# Patient Record
Sex: Female | Born: 1945
Health system: Southern US, Community
[De-identification: ages and names within clinical notes are randomized; demographics above are authoritative.]

## PROBLEM LIST (undated history)

## (undated) DIAGNOSIS — D649 Anemia, unspecified: Secondary | ICD-10-CM

## (undated) DIAGNOSIS — N189 Chronic kidney disease, unspecified: Secondary | ICD-10-CM

## (undated) DIAGNOSIS — E7800 Pure hypercholesterolemia, unspecified: Secondary | ICD-10-CM

## (undated) DIAGNOSIS — I10 Essential (primary) hypertension: Secondary | ICD-10-CM

## (undated) DIAGNOSIS — M1990 Unspecified osteoarthritis, unspecified site: Secondary | ICD-10-CM

## (undated) HISTORY — DX: Chronic kidney disease, unspecified: N18.9

## (undated) HISTORY — PX: ABDOMINAL HYSTERECTOMY: SHX81

## (undated) HISTORY — DX: Anemia, unspecified: D64.9

---

## 2006-10-02 ENCOUNTER — Ambulatory Visit: Payer: Self-pay | Admitting: Family Medicine

## 2011-08-22 ENCOUNTER — Ambulatory Visit: Payer: Self-pay | Admitting: Family Medicine

## 2014-10-06 DIAGNOSIS — D631 Anemia in chronic kidney disease: Secondary | ICD-10-CM | POA: Diagnosis not present

## 2014-10-06 DIAGNOSIS — N2581 Secondary hyperparathyroidism of renal origin: Secondary | ICD-10-CM | POA: Diagnosis not present

## 2014-10-06 DIAGNOSIS — I120 Hypertensive chronic kidney disease with stage 5 chronic kidney disease or end stage renal disease: Secondary | ICD-10-CM | POA: Diagnosis not present

## 2014-10-06 DIAGNOSIS — N185 Chronic kidney disease, stage 5: Secondary | ICD-10-CM | POA: Diagnosis not present

## 2014-10-17 DIAGNOSIS — N185 Chronic kidney disease, stage 5: Secondary | ICD-10-CM | POA: Diagnosis not present

## 2014-10-17 DIAGNOSIS — E780 Pure hypercholesterolemia: Secondary | ICD-10-CM | POA: Diagnosis not present

## 2014-10-17 DIAGNOSIS — Z23 Encounter for immunization: Secondary | ICD-10-CM | POA: Diagnosis not present

## 2014-10-17 DIAGNOSIS — I10 Essential (primary) hypertension: Secondary | ICD-10-CM | POA: Diagnosis not present

## 2014-10-17 DIAGNOSIS — Z1231 Encounter for screening mammogram for malignant neoplasm of breast: Secondary | ICD-10-CM | POA: Diagnosis not present

## 2014-10-19 DIAGNOSIS — N185 Chronic kidney disease, stage 5: Secondary | ICD-10-CM | POA: Diagnosis not present

## 2014-10-19 DIAGNOSIS — D631 Anemia in chronic kidney disease: Secondary | ICD-10-CM | POA: Diagnosis not present

## 2014-10-19 DIAGNOSIS — E872 Acidosis: Secondary | ICD-10-CM | POA: Diagnosis not present

## 2014-10-19 DIAGNOSIS — N2581 Secondary hyperparathyroidism of renal origin: Secondary | ICD-10-CM | POA: Diagnosis not present

## 2014-11-01 ENCOUNTER — Ambulatory Visit: Payer: Self-pay | Admitting: Family Medicine

## 2014-11-01 DIAGNOSIS — Z1231 Encounter for screening mammogram for malignant neoplasm of breast: Secondary | ICD-10-CM | POA: Diagnosis not present

## 2014-11-11 DIAGNOSIS — I120 Hypertensive chronic kidney disease with stage 5 chronic kidney disease or end stage renal disease: Secondary | ICD-10-CM | POA: Diagnosis not present

## 2014-11-11 DIAGNOSIS — N2581 Secondary hyperparathyroidism of renal origin: Secondary | ICD-10-CM | POA: Diagnosis not present

## 2014-11-11 DIAGNOSIS — D631 Anemia in chronic kidney disease: Secondary | ICD-10-CM | POA: Diagnosis not present

## 2014-11-11 DIAGNOSIS — N185 Chronic kidney disease, stage 5: Secondary | ICD-10-CM | POA: Diagnosis not present

## 2014-11-16 DIAGNOSIS — N2581 Secondary hyperparathyroidism of renal origin: Secondary | ICD-10-CM | POA: Diagnosis not present

## 2014-11-16 DIAGNOSIS — D631 Anemia in chronic kidney disease: Secondary | ICD-10-CM | POA: Diagnosis not present

## 2014-11-16 DIAGNOSIS — E872 Acidosis: Secondary | ICD-10-CM | POA: Diagnosis not present

## 2014-11-16 DIAGNOSIS — N185 Chronic kidney disease, stage 5: Secondary | ICD-10-CM | POA: Diagnosis not present

## 2014-12-16 DIAGNOSIS — E872 Acidosis: Secondary | ICD-10-CM | POA: Diagnosis not present

## 2014-12-16 DIAGNOSIS — I120 Hypertensive chronic kidney disease with stage 5 chronic kidney disease or end stage renal disease: Secondary | ICD-10-CM | POA: Diagnosis not present

## 2014-12-16 DIAGNOSIS — N185 Chronic kidney disease, stage 5: Secondary | ICD-10-CM | POA: Diagnosis not present

## 2014-12-16 DIAGNOSIS — D631 Anemia in chronic kidney disease: Secondary | ICD-10-CM | POA: Diagnosis not present

## 2014-12-16 DIAGNOSIS — N2581 Secondary hyperparathyroidism of renal origin: Secondary | ICD-10-CM | POA: Diagnosis not present

## 2014-12-21 DIAGNOSIS — I10 Essential (primary) hypertension: Secondary | ICD-10-CM | POA: Diagnosis not present

## 2014-12-21 DIAGNOSIS — N185 Chronic kidney disease, stage 5: Secondary | ICD-10-CM | POA: Diagnosis not present

## 2014-12-21 DIAGNOSIS — N2581 Secondary hyperparathyroidism of renal origin: Secondary | ICD-10-CM | POA: Diagnosis not present

## 2014-12-21 DIAGNOSIS — D631 Anemia in chronic kidney disease: Secondary | ICD-10-CM | POA: Diagnosis not present

## 2015-01-13 DIAGNOSIS — N185 Chronic kidney disease, stage 5: Secondary | ICD-10-CM | POA: Diagnosis not present

## 2015-01-13 DIAGNOSIS — I129 Hypertensive chronic kidney disease with stage 1 through stage 4 chronic kidney disease, or unspecified chronic kidney disease: Secondary | ICD-10-CM | POA: Diagnosis not present

## 2015-01-13 DIAGNOSIS — I120 Hypertensive chronic kidney disease with stage 5 chronic kidney disease or end stage renal disease: Secondary | ICD-10-CM | POA: Diagnosis not present

## 2015-01-18 DIAGNOSIS — I10 Essential (primary) hypertension: Secondary | ICD-10-CM | POA: Diagnosis not present

## 2015-01-18 DIAGNOSIS — N2581 Secondary hyperparathyroidism of renal origin: Secondary | ICD-10-CM | POA: Diagnosis not present

## 2015-01-18 DIAGNOSIS — E872 Acidosis: Secondary | ICD-10-CM | POA: Diagnosis not present

## 2015-01-18 DIAGNOSIS — N185 Chronic kidney disease, stage 5: Secondary | ICD-10-CM | POA: Diagnosis not present

## 2015-02-10 DIAGNOSIS — N185 Chronic kidney disease, stage 5: Secondary | ICD-10-CM | POA: Diagnosis not present

## 2015-02-10 DIAGNOSIS — I129 Hypertensive chronic kidney disease with stage 1 through stage 4 chronic kidney disease, or unspecified chronic kidney disease: Secondary | ICD-10-CM | POA: Diagnosis not present

## 2015-02-10 DIAGNOSIS — I120 Hypertensive chronic kidney disease with stage 5 chronic kidney disease or end stage renal disease: Secondary | ICD-10-CM | POA: Diagnosis not present

## 2015-02-10 DIAGNOSIS — N2581 Secondary hyperparathyroidism of renal origin: Secondary | ICD-10-CM | POA: Diagnosis not present

## 2015-02-14 DIAGNOSIS — I10 Essential (primary) hypertension: Secondary | ICD-10-CM | POA: Diagnosis not present

## 2015-02-14 DIAGNOSIS — N185 Chronic kidney disease, stage 5: Secondary | ICD-10-CM | POA: Diagnosis not present

## 2015-02-14 DIAGNOSIS — N2581 Secondary hyperparathyroidism of renal origin: Secondary | ICD-10-CM | POA: Diagnosis not present

## 2015-02-14 DIAGNOSIS — D631 Anemia in chronic kidney disease: Secondary | ICD-10-CM | POA: Diagnosis not present

## 2015-02-24 DIAGNOSIS — M25561 Pain in right knee: Secondary | ICD-10-CM | POA: Diagnosis not present

## 2015-02-24 DIAGNOSIS — M25562 Pain in left knee: Secondary | ICD-10-CM | POA: Diagnosis not present

## 2015-02-24 DIAGNOSIS — G8929 Other chronic pain: Secondary | ICD-10-CM | POA: Diagnosis not present

## 2015-03-17 DIAGNOSIS — L120 Bullous pemphigoid: Secondary | ICD-10-CM | POA: Diagnosis not present

## 2015-03-17 DIAGNOSIS — D631 Anemia in chronic kidney disease: Secondary | ICD-10-CM | POA: Diagnosis not present

## 2015-03-17 DIAGNOSIS — E872 Acidosis: Secondary | ICD-10-CM | POA: Diagnosis not present

## 2015-03-17 DIAGNOSIS — N2581 Secondary hyperparathyroidism of renal origin: Secondary | ICD-10-CM | POA: Diagnosis not present

## 2015-03-22 DIAGNOSIS — I10 Essential (primary) hypertension: Secondary | ICD-10-CM | POA: Diagnosis not present

## 2015-03-22 DIAGNOSIS — N185 Chronic kidney disease, stage 5: Secondary | ICD-10-CM | POA: Diagnosis not present

## 2015-03-22 DIAGNOSIS — D631 Anemia in chronic kidney disease: Secondary | ICD-10-CM | POA: Diagnosis not present

## 2015-03-22 DIAGNOSIS — N2581 Secondary hyperparathyroidism of renal origin: Secondary | ICD-10-CM | POA: Diagnosis not present

## 2015-04-18 DIAGNOSIS — Z1211 Encounter for screening for malignant neoplasm of colon: Secondary | ICD-10-CM | POA: Diagnosis not present

## 2015-04-18 DIAGNOSIS — I10 Essential (primary) hypertension: Secondary | ICD-10-CM | POA: Diagnosis not present

## 2015-04-18 DIAGNOSIS — E780 Pure hypercholesterolemia: Secondary | ICD-10-CM | POA: Diagnosis not present

## 2015-04-18 DIAGNOSIS — N185 Chronic kidney disease, stage 5: Secondary | ICD-10-CM | POA: Diagnosis not present

## 2015-04-28 DIAGNOSIS — N2581 Secondary hyperparathyroidism of renal origin: Secondary | ICD-10-CM | POA: Diagnosis not present

## 2015-04-28 DIAGNOSIS — N185 Chronic kidney disease, stage 5: Secondary | ICD-10-CM | POA: Diagnosis not present

## 2015-04-28 DIAGNOSIS — D631 Anemia in chronic kidney disease: Secondary | ICD-10-CM | POA: Diagnosis not present

## 2015-04-28 DIAGNOSIS — E872 Acidosis: Secondary | ICD-10-CM | POA: Diagnosis not present

## 2015-04-28 DIAGNOSIS — I10 Essential (primary) hypertension: Secondary | ICD-10-CM | POA: Diagnosis not present

## 2015-06-02 DIAGNOSIS — I129 Hypertensive chronic kidney disease with stage 1 through stage 4 chronic kidney disease, or unspecified chronic kidney disease: Secondary | ICD-10-CM | POA: Diagnosis not present

## 2015-06-02 DIAGNOSIS — N2581 Secondary hyperparathyroidism of renal origin: Secondary | ICD-10-CM | POA: Diagnosis not present

## 2015-06-02 DIAGNOSIS — E872 Acidosis: Secondary | ICD-10-CM | POA: Diagnosis not present

## 2015-06-02 DIAGNOSIS — N185 Chronic kidney disease, stage 5: Secondary | ICD-10-CM | POA: Diagnosis not present

## 2015-06-02 DIAGNOSIS — D631 Anemia in chronic kidney disease: Secondary | ICD-10-CM | POA: Diagnosis not present

## 2015-06-06 DIAGNOSIS — D631 Anemia in chronic kidney disease: Secondary | ICD-10-CM | POA: Diagnosis not present

## 2015-06-06 DIAGNOSIS — N185 Chronic kidney disease, stage 5: Secondary | ICD-10-CM | POA: Diagnosis not present

## 2015-06-06 DIAGNOSIS — E872 Acidosis: Secondary | ICD-10-CM | POA: Diagnosis not present

## 2015-06-06 DIAGNOSIS — I10 Essential (primary) hypertension: Secondary | ICD-10-CM | POA: Diagnosis not present

## 2015-06-22 ENCOUNTER — Inpatient Hospital Stay: Payer: Self-pay | Admitting: Hematology and Oncology

## 2015-07-06 ENCOUNTER — Inpatient Hospital Stay: Payer: Commercial Managed Care - HMO

## 2015-07-06 ENCOUNTER — Inpatient Hospital Stay: Payer: Commercial Managed Care - HMO | Attending: Hematology and Oncology | Admitting: Hematology and Oncology

## 2015-07-06 ENCOUNTER — Encounter: Payer: Self-pay | Admitting: Hematology and Oncology

## 2015-07-06 VITALS — BP 121/77 | HR 83 | Temp 98.0°F | Ht 63.0 in | Wt 141.1 lb

## 2015-07-06 DIAGNOSIS — D631 Anemia in chronic kidney disease: Secondary | ICD-10-CM

## 2015-07-06 DIAGNOSIS — D649 Anemia, unspecified: Secondary | ICD-10-CM

## 2015-07-06 DIAGNOSIS — N185 Chronic kidney disease, stage 5: Secondary | ICD-10-CM

## 2015-07-06 DIAGNOSIS — I129 Hypertensive chronic kidney disease with stage 1 through stage 4 chronic kidney disease, or unspecified chronic kidney disease: Secondary | ICD-10-CM | POA: Diagnosis not present

## 2015-07-06 DIAGNOSIS — Z79899 Other long term (current) drug therapy: Secondary | ICD-10-CM | POA: Insufficient documentation

## 2015-07-06 DIAGNOSIS — D573 Sickle-cell trait: Secondary | ICD-10-CM | POA: Diagnosis not present

## 2015-07-06 LAB — IRON AND TIBC
Iron: 52 ug/dL (ref 28–170)
Saturation Ratios: 18 % (ref 10.4–31.8)
TIBC: 297 ug/dL (ref 250–450)
UIBC: 245 ug/dL

## 2015-07-06 LAB — VITAMIN B12: Vitamin B-12: 324 pg/mL (ref 180–914)

## 2015-07-06 LAB — CBC WITH DIFFERENTIAL/PLATELET
Basophils Absolute: 0 10*3/uL (ref 0–0.1)
Basophils Relative: 1 %
Eosinophils Absolute: 0.2 10*3/uL (ref 0–0.7)
Eosinophils Relative: 2 %
HCT: 24 % — ABNORMAL LOW (ref 35.0–47.0)
Hemoglobin: 7.9 g/dL — ABNORMAL LOW (ref 12.0–16.0)
Lymphocytes Relative: 26 %
Lymphs Abs: 2.5 10*3/uL (ref 1.0–3.6)
MCH: 25 pg — ABNORMAL LOW (ref 26.0–34.0)
MCHC: 32.7 g/dL (ref 32.0–36.0)
MCV: 76.4 fL — ABNORMAL LOW (ref 80.0–100.0)
Monocytes Absolute: 0.8 10*3/uL (ref 0.2–0.9)
Monocytes Relative: 8 %
Neutro Abs: 6 10*3/uL (ref 1.4–6.5)
Neutrophils Relative %: 63 %
Platelets: 375 10*3/uL (ref 150–440)
RBC: 3.14 MIL/uL — ABNORMAL LOW (ref 3.80–5.20)
RDW: 14.2 % (ref 11.5–14.5)
WBC: 9.5 10*3/uL (ref 3.6–11.0)

## 2015-07-06 LAB — FERRITIN: Ferritin: 109 ng/mL (ref 11–307)

## 2015-07-06 LAB — FOLATE: Folate: 14 ng/mL (ref >5.9)

## 2015-07-06 LAB — TSH: TSH: 0.735 u[IU]/mL (ref 0.350–4.500)

## 2015-07-06 NOTE — Progress Notes (Signed)
[facility] day:  07/06/2015  Chief Complaint: Kathleen Sharp is a 69 y.o. female with stage V chronic kidney disease who is referred by Dr Candiss Norse for management of anemia of chronic renal disease.  HPI: The patient states that she has been followed by Dr. Candiss Norse for a stage V chronic kidney disease. Her renal function has been going down over time and is felt secondary to hypertension.  She has a family history of renal disease (father and brother). She is not on dialysis.    She states that she has been on oral iron x 1 year.  It has helped a little.  She watches her diet closely.  She loves meat, but is limited secondary to her renal disease.  She craves meat and chocolate.  She denies any melena or hematochezia. She notes that her stool is dark on oral iron. She denies any nose bleeds, gum bleeds, hematuria or vaginal bleeding.  She states that she missed her colonoscopy, but has one planned. She has never had an EGD. Hemoglobin electrophoresis revealed sickle cell trait with a hemoglobin S of 32%.  Patient has had serial labs.  CBC on 01/13/2015 included a hematocrit 23.3, hemoglobin 7.8, MCV 76, platelets 351,000, white count 7300 with an ANC of 4300. Creatinine was 4.82.  CBC on 02/10/2015 included a hematocrit 22.9, hemoglobin 8, and MCV 74.  Creatinine was 4.72.  CBC on 03/17/2015 included a hematocrit 23.6, hemoglobin 7.8, and MCV 76. Creatinine was 4.61. CBC on 04/28/2015 included a hematocrit of 22.6, hemoglobin 7.5, and MCV 74.  Creatinine was was 5.29.  CBC on 06/02/2015 revealed a hematocrit 22.7, hemoglobin 7.3, MCV 75 platelets 386,000, white count 8100 with an ANC of 4800. Creatinine was 4.74.  Symptomatically, she feels a little sluggish.  Past Medical History  Diagnosis Date  . Anemia   . Chronic kidney disease     No past surgical history on file.  Family History  Problem Relation Age of Onset  . Kidney disease Father   .  Kidney disease Brother   . Heart disease Mother     Social History:  has no tobacco, alcohol, and drug history on file.  The patient is alone today.  Allergies: No Known Allergies  Current Medications: Current Outpatient Prescriptions  Medication Sig Dispense Refill  . atorvastatin (LIPITOR) 20 MG tablet Take by mouth.    . calcitRIOL (ROCALTROL) 0.25 MCG capsule Take by mouth.    . IRON PO Take by mouth.    Marland Kitchen lisinopril-hydrochlorothiazide (PRINZIDE,ZESTORETIC) 20-25 MG tablet Take by mouth.    Marland Kitchen NIFEdipine (PROCARDIA-XL/ADALAT CC) 60 MG 24 hr tablet Take by mouth.    . sodium chloride 1 G tablet Take 1 g by mouth once.     No current facility-administered medications for this visit.    Review of Systems:  GENERAL:  Feels sluggish, but alright.  No fevers, sweats or weight loss. PERFORMANCE STATUS (ECOG):  1 HEENT:  No visual changes, runny nose, sore throat, mouth sores or tenderness. Lungs: No shortness of breath or cough.  No hemoptysis. Cardiac:  No chest pain, palpitations, orthopnea, or PND. GI:  No nausea, vomiting, diarrhea, constipation, melena or hematochezia. GU:  No urgency, frequency, dysuria, or hematuria. Musculoskeletal:  No back pain.  No joint pain.  No muscle tenderness. Extremities:  No pain or swelling. Skin:  No rashes or skin changes. Neuro:  No headache, numbness or weakness, balance or coordination issues. Endocrine:  No diabetes, thyroid issues, hot flashes or night sweats. Psych:  No mood changes, depression or anxiety. Pain:  No focal pain. Review of systems:  All other systems reviewed and found to be negative.  Physical Exam: Blood pressure 121/77, pulse 83, temperature 98 F (36.7 C), temperature source Oral, height 5\' 3"  (1.6 m), weight 141 lb 1.5 oz (64 kg). GENERAL:  Well developed, well nourished, sitting comfortably in the exam room in no acute distress. MENTAL STATUS:  Alert and oriented to person, place and time. HEAD:  Short styled  hair with red rinse.  Normocephalic, atraumatic, face symmetric, no Cushingoid features. EYES:  Glasses.  Brown eyes.  Pupils equal round and reactive to light and accomodation.  No conjunctivitis or scleral icterus. ENT:  Oropharynx clear without lesion.  Upper dentures.  Tongue normal. Mucous membranes moist.  RESPIRATORY:  Clear to auscultation without rales, wheezes or rhonchi. CARDIOVASCULAR:  Regular rate and rhythm without murmur, rub or gallop. ABDOMEN:  Soft, non-tender, with active bowel sounds, and no hepatosplenomegaly.  No masses. SKIN:  No rashes, ulcers or lesions. EXTREMITIES: No edema, no skin discoloration or tenderness.  No palpable cords. LYMPH NODES: No palpable cervical, supraclavicular, axillary or inguinal adenopathy  NEUROLOGICAL: Unremarkable. PSYCH:  Appropriate.  Appointment on 07/06/2015  Component Date Value Ref Range Status  . WBC 07/06/2015 9.5  3.6 - 11.0 K/uL Final  . RBC 07/06/2015 3.14* 3.80 - 5.20 MIL/uL Final  . Hemoglobin 07/06/2015 7.9* 12.0 - 16.0 g/dL Final  . HCT 07/08/2015 24.0* 35.0 - 47.0 % Final  . MCV 07/06/2015 76.4* 80.0 - 100.0 fL Final  . MCH 07/06/2015 25.0* 26.0 - 34.0 pg Final  . MCHC 07/06/2015 32.7  32.0 - 36.0 g/dL Final  . RDW 07/08/2015 14.2  11.5 - 14.5 % Final  . Platelets 07/06/2015 375  150 - 440 K/uL Final  . Neutrophils Relative % 07/06/2015 63   Final  . Neutro Abs 07/06/2015 6.0  1.4 - 6.5 K/uL Final  . Lymphocytes Relative 07/06/2015 26   Final  . Lymphs Abs 07/06/2015 2.5  1.0 - 3.6 K/uL Final  . Monocytes Relative 07/06/2015 8   Final  . Monocytes Absolute 07/06/2015 0.8  0.2 - 0.9 K/uL Final  . Eosinophils Relative 07/06/2015 2   Final  . Eosinophils Absolute 07/06/2015 0.2  0 - 0.7 K/uL Final  . Basophils Relative 07/06/2015 1   Final  . Basophils Absolute 07/06/2015 0.0  0 - 0.1 K/uL Final  . Ferritin 07/06/2015 109  11 - 307 ng/mL Final  . Iron 07/06/2015 52  28 - 170 ug/dL Final  . TIBC 07/08/2015 297  250  - 450 ug/dL Final  . Saturation Ratios 07/06/2015 18  10.4 - 31.8 % Final  . UIBC 07/06/2015 245   Final  . Vitamin B-12 07/06/2015 324  180 - 914 pg/mL Final   Comment: (NOTE) This assay is not validated for testing neonatal or myeloproliferative syndrome specimens for Vitamin B12 levels. Performed at [facility]   . Folate 07/06/2015 14.0  >5.9 ng/mL Final  . TSH 07/06/2015 0.735  0.350 - 4.500 uIU/mL Final  . Erythropoietin 07/06/2015 4.8  2.6 - 18.5 mIU/mL Final   Comment: (NOTE) Performed At: [facility] [address] [place], [place] [id number] MD Mila Homer     Assessment:  Kathleen Sharp is a 69 y.o. female with stage V chronic kidney disease secondary to hypertension.  Creatinine has ranged between 4.72 and 5.29  over the past several months.  She has been on oral iron for over a year with only slight improvement in her hematocrit.  Hemoglobin electrophoresis revealed sickle cell trait with a hemoglobin S of 32%.  She has never had a colonoscopy.  She denies any bleeding.  Diet is good.  Symptomatically, she is fatigued.  Exam is unremarkable.  Plan: 1. Discuss anemia of chronic renal disease.  Discuss management with Procrit.  Discuss baseline labs. 2. Labs today:  CBC with diff, B12, folate, ferritin, iron studies, TSH, epo level. 3. Preauth Procrit 4. RTC for labs (Hgb) +/- Procrit when authorized. 5. RTC in 6 weeks for MD assessment and labs (CBC +/- others) and +/- Procrit.   Lequita Asal, MD  07/06/2015, 2:18 PM

## 2015-07-06 NOTE — Progress Notes (Signed)
Patient is here for initial visit, referral from Dr. Candiss Norse

## 2015-07-07 DIAGNOSIS — N2581 Secondary hyperparathyroidism of renal origin: Secondary | ICD-10-CM | POA: Diagnosis not present

## 2015-07-07 DIAGNOSIS — E872 Acidosis: Secondary | ICD-10-CM | POA: Diagnosis not present

## 2015-07-07 DIAGNOSIS — D631 Anemia in chronic kidney disease: Secondary | ICD-10-CM | POA: Diagnosis not present

## 2015-07-07 DIAGNOSIS — I10 Essential (primary) hypertension: Secondary | ICD-10-CM | POA: Diagnosis not present

## 2015-07-07 DIAGNOSIS — N185 Chronic kidney disease, stage 5: Secondary | ICD-10-CM | POA: Diagnosis not present

## 2015-07-07 LAB — ERYTHROPOIETIN: Erythropoietin: 4.8 m[IU]/mL (ref 2.6–18.5)

## 2015-07-12 ENCOUNTER — Encounter: Payer: Self-pay | Admitting: Hematology and Oncology

## 2015-07-12 DIAGNOSIS — N185 Chronic kidney disease, stage 5: Secondary | ICD-10-CM | POA: Diagnosis not present

## 2015-07-12 DIAGNOSIS — E872 Acidosis: Secondary | ICD-10-CM | POA: Diagnosis not present

## 2015-07-12 DIAGNOSIS — N2581 Secondary hyperparathyroidism of renal origin: Secondary | ICD-10-CM | POA: Diagnosis not present

## 2015-07-12 DIAGNOSIS — I10 Essential (primary) hypertension: Secondary | ICD-10-CM | POA: Diagnosis not present

## 2015-07-18 ENCOUNTER — Other Ambulatory Visit: Payer: Self-pay | Admitting: Hematology and Oncology

## 2015-07-18 DIAGNOSIS — D631 Anemia in chronic kidney disease: Secondary | ICD-10-CM

## 2015-07-18 DIAGNOSIS — N185 Chronic kidney disease, stage 5: Principal | ICD-10-CM

## 2015-07-19 ENCOUNTER — Inpatient Hospital Stay: Payer: Commercial Managed Care - HMO

## 2015-07-19 ENCOUNTER — Other Ambulatory Visit: Payer: Self-pay

## 2015-07-19 DIAGNOSIS — D631 Anemia in chronic kidney disease: Secondary | ICD-10-CM

## 2015-07-19 DIAGNOSIS — N185 Chronic kidney disease, stage 5: Principal | ICD-10-CM

## 2015-07-20 ENCOUNTER — Inpatient Hospital Stay: Payer: Commercial Managed Care - HMO | Attending: Hematology and Oncology

## 2015-07-20 ENCOUNTER — Inpatient Hospital Stay: Payer: Commercial Managed Care - HMO

## 2015-07-20 DIAGNOSIS — D631 Anemia in chronic kidney disease: Secondary | ICD-10-CM | POA: Insufficient documentation

## 2015-07-20 DIAGNOSIS — Z79899 Other long term (current) drug therapy: Secondary | ICD-10-CM | POA: Insufficient documentation

## 2015-07-20 DIAGNOSIS — D573 Sickle-cell trait: Secondary | ICD-10-CM | POA: Insufficient documentation

## 2015-07-20 DIAGNOSIS — I129 Hypertensive chronic kidney disease with stage 1 through stage 4 chronic kidney disease, or unspecified chronic kidney disease: Secondary | ICD-10-CM | POA: Insufficient documentation

## 2015-07-20 DIAGNOSIS — N185 Chronic kidney disease, stage 5: Secondary | ICD-10-CM | POA: Insufficient documentation

## 2015-07-20 NOTE — Progress Notes (Unsigned)
Unable to get blood drawn for labs.  4 attempts in cancer center were made to draw labs, patient sent to phlebotomy in the [facility] facility and there were unsuccessful.  Dr. Florian Buff contacted and due to insurance procrit unable to be given unless recent Hgb value present.  Patient will return to clinic next week for lab draw and procrit injection.

## 2015-07-21 ENCOUNTER — Other Ambulatory Visit: Payer: Commercial Managed Care - HMO

## 2015-07-21 ENCOUNTER — Ambulatory Visit: Payer: Commercial Managed Care - HMO

## 2015-07-26 ENCOUNTER — Inpatient Hospital Stay: Payer: Commercial Managed Care - HMO

## 2015-07-26 VITALS — BP 146/82 | HR 76 | Temp 97.0°F | Resp 18

## 2015-07-26 DIAGNOSIS — D631 Anemia in chronic kidney disease: Secondary | ICD-10-CM

## 2015-07-26 DIAGNOSIS — N185 Chronic kidney disease, stage 5: Principal | ICD-10-CM

## 2015-07-26 DIAGNOSIS — Z79899 Other long term (current) drug therapy: Secondary | ICD-10-CM | POA: Diagnosis not present

## 2015-07-26 DIAGNOSIS — I129 Hypertensive chronic kidney disease with stage 1 through stage 4 chronic kidney disease, or unspecified chronic kidney disease: Secondary | ICD-10-CM | POA: Diagnosis not present

## 2015-07-26 DIAGNOSIS — D573 Sickle-cell trait: Secondary | ICD-10-CM | POA: Diagnosis not present

## 2015-07-26 LAB — HEMOGLOBIN: Hemoglobin: 7.4 g/dL — ABNORMAL LOW (ref 12.0–16.0)

## 2015-07-26 MED ORDER — EPOETIN ALFA 10000 UNIT/ML IJ SOLN
10000.0000 [IU] | Freq: Once | INTRAMUSCULAR | Status: AC
  Administered 2015-07-26: 10000 [IU] via SUBCUTANEOUS

## 2015-07-27 ENCOUNTER — Other Ambulatory Visit: Payer: Commercial Managed Care - HMO

## 2015-07-27 ENCOUNTER — Ambulatory Visit: Payer: Commercial Managed Care - HMO

## 2015-08-02 ENCOUNTER — Other Ambulatory Visit: Payer: Self-pay | Admitting: Hematology and Oncology

## 2015-08-02 ENCOUNTER — Inpatient Hospital Stay: Payer: Commercial Managed Care - HMO

## 2015-08-02 VITALS — BP 120/60 | HR 85 | Temp 97.0°F | Resp 18

## 2015-08-02 DIAGNOSIS — Z79899 Other long term (current) drug therapy: Secondary | ICD-10-CM | POA: Diagnosis not present

## 2015-08-02 DIAGNOSIS — I129 Hypertensive chronic kidney disease with stage 1 through stage 4 chronic kidney disease, or unspecified chronic kidney disease: Secondary | ICD-10-CM | POA: Diagnosis not present

## 2015-08-02 DIAGNOSIS — D631 Anemia in chronic kidney disease: Secondary | ICD-10-CM

## 2015-08-02 DIAGNOSIS — N185 Chronic kidney disease, stage 5: Secondary | ICD-10-CM | POA: Diagnosis not present

## 2015-08-02 DIAGNOSIS — D573 Sickle-cell trait: Secondary | ICD-10-CM | POA: Diagnosis not present

## 2015-08-02 LAB — HEMOGLOBIN: Hemoglobin: 7.7 g/dL — ABNORMAL LOW (ref 12.0–16.0)

## 2015-08-02 MED ORDER — EPOETIN ALFA 10000 UNIT/ML IJ SOLN
10000.0000 [IU] | Freq: Once | INTRAMUSCULAR | Status: AC
  Administered 2015-08-02: 10000 [IU] via SUBCUTANEOUS
  Filled 2015-08-02: qty 2

## 2015-08-03 ENCOUNTER — Other Ambulatory Visit: Payer: Commercial Managed Care - HMO

## 2015-08-03 ENCOUNTER — Ambulatory Visit: Payer: Commercial Managed Care - HMO

## 2015-08-09 ENCOUNTER — Inpatient Hospital Stay: Payer: Commercial Managed Care - HMO | Admitting: Hematology and Oncology

## 2015-08-09 ENCOUNTER — Inpatient Hospital Stay: Payer: Commercial Managed Care - HMO

## 2015-08-09 ENCOUNTER — Ambulatory Visit: Payer: Commercial Managed Care - HMO | Admitting: Hematology and Oncology

## 2015-08-09 ENCOUNTER — Ambulatory Visit: Payer: Commercial Managed Care - HMO

## 2015-08-09 ENCOUNTER — Other Ambulatory Visit: Payer: Commercial Managed Care - HMO

## 2015-08-09 VITALS — BP 158/80 | HR 82 | Temp 97.3°F | Resp 20

## 2015-08-09 DIAGNOSIS — D573 Sickle-cell trait: Secondary | ICD-10-CM | POA: Diagnosis not present

## 2015-08-09 DIAGNOSIS — N2581 Secondary hyperparathyroidism of renal origin: Secondary | ICD-10-CM | POA: Diagnosis not present

## 2015-08-09 DIAGNOSIS — D631 Anemia in chronic kidney disease: Secondary | ICD-10-CM | POA: Diagnosis not present

## 2015-08-09 DIAGNOSIS — I10 Essential (primary) hypertension: Secondary | ICD-10-CM | POA: Diagnosis not present

## 2015-08-09 DIAGNOSIS — N185 Chronic kidney disease, stage 5: Principal | ICD-10-CM

## 2015-08-09 DIAGNOSIS — I129 Hypertensive chronic kidney disease with stage 1 through stage 4 chronic kidney disease, or unspecified chronic kidney disease: Secondary | ICD-10-CM | POA: Diagnosis not present

## 2015-08-09 DIAGNOSIS — E872 Acidosis: Secondary | ICD-10-CM | POA: Diagnosis not present

## 2015-08-09 DIAGNOSIS — Z79899 Other long term (current) drug therapy: Secondary | ICD-10-CM | POA: Diagnosis not present

## 2015-08-09 LAB — CBC WITH DIFFERENTIAL/PLATELET
Basophils Absolute: 0.1 10*3/uL (ref 0–0.1)
Basophils Relative: 1 %
Eosinophils Absolute: 0.2 10*3/uL (ref 0–0.7)
Eosinophils Relative: 2 %
HCT: 27.5 % — ABNORMAL LOW (ref 35.0–47.0)
Hemoglobin: 9 g/dL — ABNORMAL LOW (ref 12.0–16.0)
Lymphocytes Relative: 22 %
Lymphs Abs: 2.4 10*3/uL (ref 1.0–3.6)
MCH: 25.2 pg — ABNORMAL LOW (ref 26.0–34.0)
MCHC: 32.7 g/dL (ref 32.0–36.0)
MCV: 77.1 fL — ABNORMAL LOW (ref 80.0–100.0)
Monocytes Absolute: 1 10*3/uL — ABNORMAL HIGH (ref 0.2–0.9)
Monocytes Relative: 9 %
Neutro Abs: 7.3 10*3/uL — ABNORMAL HIGH (ref 1.4–6.5)
Neutrophils Relative %: 66 %
Platelets: 376 10*3/uL (ref 150–440)
RBC: 3.56 MIL/uL — ABNORMAL LOW (ref 3.80–5.20)
RDW: 14.8 % — ABNORMAL HIGH (ref 11.5–14.5)
WBC: 11 10*3/uL (ref 3.6–11.0)

## 2015-08-09 MED ORDER — EPOETIN ALFA 10000 UNIT/ML IJ SOLN
10000.0000 [IU] | Freq: Once | INTRAMUSCULAR | Status: AC
  Administered 2015-08-09: 10000 [IU] via SUBCUTANEOUS

## 2015-08-14 ENCOUNTER — Other Ambulatory Visit: Payer: Commercial Managed Care - HMO

## 2015-08-14 ENCOUNTER — Ambulatory Visit: Payer: Commercial Managed Care - HMO | Admitting: Hematology and Oncology

## 2015-08-14 ENCOUNTER — Ambulatory Visit: Payer: Commercial Managed Care - HMO

## 2015-08-15 DIAGNOSIS — I10 Essential (primary) hypertension: Secondary | ICD-10-CM | POA: Diagnosis not present

## 2015-08-15 DIAGNOSIS — N2581 Secondary hyperparathyroidism of renal origin: Secondary | ICD-10-CM | POA: Diagnosis not present

## 2015-08-15 DIAGNOSIS — N185 Chronic kidney disease, stage 5: Secondary | ICD-10-CM | POA: Diagnosis not present

## 2015-08-15 DIAGNOSIS — D631 Anemia in chronic kidney disease: Secondary | ICD-10-CM | POA: Diagnosis not present

## 2015-08-16 ENCOUNTER — Telehealth: Payer: Self-pay | Admitting: Hematology and Oncology

## 2015-08-16 ENCOUNTER — Other Ambulatory Visit: Payer: Self-pay | Admitting: Hematology and Oncology

## 2015-08-16 ENCOUNTER — Ambulatory Visit: Payer: Commercial Managed Care - HMO

## 2015-08-16 ENCOUNTER — Other Ambulatory Visit: Payer: Commercial Managed Care - HMO

## 2015-08-16 ENCOUNTER — Ambulatory Visit: Payer: Commercial Managed Care - HMO | Admitting: Hematology and Oncology

## 2015-08-16 ENCOUNTER — Inpatient Hospital Stay: Payer: Commercial Managed Care - HMO

## 2015-08-16 DIAGNOSIS — Z79899 Other long term (current) drug therapy: Secondary | ICD-10-CM | POA: Diagnosis not present

## 2015-08-16 DIAGNOSIS — D631 Anemia in chronic kidney disease: Secondary | ICD-10-CM

## 2015-08-16 DIAGNOSIS — D573 Sickle-cell trait: Secondary | ICD-10-CM | POA: Diagnosis not present

## 2015-08-16 DIAGNOSIS — N185 Chronic kidney disease, stage 5: Principal | ICD-10-CM

## 2015-08-16 DIAGNOSIS — I129 Hypertensive chronic kidney disease with stage 1 through stage 4 chronic kidney disease, or unspecified chronic kidney disease: Secondary | ICD-10-CM | POA: Diagnosis not present

## 2015-08-16 LAB — HEMOGLOBIN: Hemoglobin: 9.6 g/dL — ABNORMAL LOW (ref 12.0–16.0)

## 2015-08-16 MED ORDER — EPOETIN ALFA 10000 UNIT/ML IJ SOLN: 10000.0000 [IU] | Freq: Once | INTRAMUSCULAR | Status: AC

## 2015-08-16 NOTE — Telephone Encounter (Signed)
Re:  Procrit  I discussed with the patient today about holding her Procrit as her hematocrit had come up significantly after only 3 dose.   She will be switched to every other week Procrit.  Lequita Asal, MD

## 2015-08-28 ENCOUNTER — Other Ambulatory Visit: Payer: Self-pay

## 2015-08-28 DIAGNOSIS — N185 Chronic kidney disease, stage 5: Principal | ICD-10-CM

## 2015-08-28 DIAGNOSIS — D631 Anemia in chronic kidney disease: Secondary | ICD-10-CM

## 2015-08-30 ENCOUNTER — Encounter: Payer: Self-pay | Admitting: Hematology and Oncology

## 2015-08-30 ENCOUNTER — Inpatient Hospital Stay: Payer: Commercial Managed Care - HMO | Attending: Hematology and Oncology | Admitting: Hematology and Oncology

## 2015-08-30 ENCOUNTER — Inpatient Hospital Stay: Payer: Commercial Managed Care - HMO

## 2015-08-30 ENCOUNTER — Ambulatory Visit: Payer: Commercial Managed Care - HMO

## 2015-08-30 VITALS — BP 142/85 | HR 74 | Temp 98.1°F | Resp 18 | Ht 63.0 in | Wt 183.9 lb

## 2015-08-30 DIAGNOSIS — Z87891 Personal history of nicotine dependence: Secondary | ICD-10-CM | POA: Diagnosis not present

## 2015-08-30 DIAGNOSIS — D631 Anemia in chronic kidney disease: Secondary | ICD-10-CM | POA: Diagnosis not present

## 2015-08-30 DIAGNOSIS — N185 Chronic kidney disease, stage 5: Secondary | ICD-10-CM

## 2015-08-30 DIAGNOSIS — I129 Hypertensive chronic kidney disease with stage 1 through stage 4 chronic kidney disease, or unspecified chronic kidney disease: Secondary | ICD-10-CM | POA: Diagnosis not present

## 2015-08-30 DIAGNOSIS — D573 Sickle-cell trait: Secondary | ICD-10-CM

## 2015-08-30 DIAGNOSIS — Z79899 Other long term (current) drug therapy: Secondary | ICD-10-CM | POA: Diagnosis not present

## 2015-08-30 LAB — CBC WITH DIFFERENTIAL/PLATELET
Basophils Absolute: 0.1 10*3/uL (ref 0–0.1)
Basophils Relative: 1 %
Eosinophils Absolute: 0.1 10*3/uL (ref 0–0.7)
Eosinophils Relative: 2 %
HCT: 28.5 % — ABNORMAL LOW (ref 35.0–47.0)
Hemoglobin: 9.2 g/dL — ABNORMAL LOW (ref 12.0–16.0)
Lymphocytes Relative: 25 %
Lymphs Abs: 2.3 10*3/uL (ref 1.0–3.6)
MCH: 24.9 pg — ABNORMAL LOW (ref 26.0–34.0)
MCHC: 32.4 g/dL (ref 32.0–36.0)
MCV: 76.8 fL — ABNORMAL LOW (ref 80.0–100.0)
Monocytes Absolute: 0.9 10*3/uL (ref 0.2–0.9)
Monocytes Relative: 10 %
Neutro Abs: 5.7 10*3/uL (ref 1.4–6.5)
Neutrophils Relative %: 62 %
Platelets: 327 10*3/uL (ref 150–440)
RBC: 3.71 MIL/uL — ABNORMAL LOW (ref 3.80–5.20)
RDW: 14.5 % (ref 11.5–14.5)
WBC: 9 10*3/uL (ref 3.6–11.0)

## 2015-08-30 MED ORDER — EPOETIN ALFA 10000 UNIT/ML IJ SOLN
10000.0000 [IU] | Freq: Once | INTRAMUSCULAR | Status: AC
  Administered 2015-08-30: 10000 [IU] via SUBCUTANEOUS

## 2015-08-30 NOTE — Progress Notes (Signed)
[facility] day:  08/30/2015  Chief Complaint: Kathleen Sharp is a 69 y.o. female with stage V chronic kidney disease who is seen for 6 week assessment on Procrit.  HPI: The patient was last seen in the medical oncology clinic on 07/06/2015.  At that time, she was seen for initial consultation regarding anemia of chronic renal disease.  Creatinine had ranged between 4.72-5.29 over the past several months.  She had been on oral iron for over a year with only slight improvement in her hematocrit.  Hemoglobin electrophoresis revealed sickle cell trait with a hemoglobin S of 32%.  She had never had a colonoscopy.  She denied any bleeding.  Diet was good.  Labs on 07/06/2015 revealed a hematocrit of 24.0, hemoglobin 7.9, MCV 76.4, platelets 375,000, WBC 9500 with an ANC 6000.  Ferritin was 109 with 18% saturation and TIBC 297 (normal).  B12 was 324 and folate 14.0.  TSH was 0.735.  Erythropoietin level was 4.8.  She was started on Procrit 10,000 units a week.  She received Procrit on 07/26/2015, 08/02/2015, and 08/09/2015. Hemoglobin was 7.4 on 07/26/2015, 7.7 on 08/02/2015, 9.0 on 08/09/2015, and 9.6 on 08/16/2015.  Because her hematocrit went up significantly after only 3 doses, Procrit was held on 08/16/2015.  Symptomatically, she feels like she has "more pep in my step".  She feels good.  She denies any concerns.  She has a follow-up with nephrology on 09/12/2015.  Past Medical History  Diagnosis Date  . Anemia   . Chronic kidney disease     Past Surgical History  Procedure Laterality Date  . Abdominal hysterectomy      Family History  Problem Relation Age of Onset  . Kidney disease Father   . Kidney disease Brother   . Heart disease Mother     Social History:  reports that she quit smoking about 2 years ago. Her smoking use included Cigarettes. She has a 15 pack-year smoking history. She does not have any smokeless tobacco history on  file. She reports that she does not drink alcohol or use illicit drugs.  The patient is alone today.  Allergies: No Known Allergies  Current Medications: Current Outpatient Prescriptions  Medication Sig Dispense Refill  . atorvastatin (LIPITOR) 20 MG tablet Take by mouth.    . calcitRIOL (ROCALTROL) 0.25 MCG capsule Take by mouth.    . IRON PO Take by mouth.    Marland Kitchen lisinopril-hydrochlorothiazide (PRINZIDE,ZESTORETIC) 20-25 MG tablet Take by mouth.    Marland Kitchen NIFEdipine (PROCARDIA-XL/ADALAT CC) 60 MG 24 hr tablet Take by mouth.    . sodium chloride 1 G tablet Take 1 g by mouth once.     No current facility-administered medications for this visit.   Facility-Administered Medications Ordered in Other Visits  Medication Dose Route Frequency Provider Last Rate Last Dose  . epoetin alfa (EPOGEN,PROCRIT) injection 10,000 Units  10,000 Units Subcutaneous Once Lequita Asal, MD   10,000 Units at 08/16/15 1212    Review of Systems:  GENERAL:  Feels good.  "More pep in my step".  No fevers, sweats or weight loss. PERFORMANCE STATUS (ECOG):  1 HEENT:  No visual changes, runny nose, sore throat, mouth sores or tenderness. Lungs: No shortness of breath or cough.  No hemoptysis. Cardiac:  No chest pain, palpitations, orthopnea, or PND. GI:  No nausea, vomiting, diarrhea, constipation, melena or hematochezia. GU:  No urgency, frequency, dysuria, or hematuria. Musculoskeletal:  No back pain.  No  joint pain.  No muscle tenderness. Extremities:  No pain or swelling. Skin:  No rashes or skin changes. Neuro:  No headache, numbness or weakness, balance or coordination issues. Endocrine:  No diabetes, thyroid issues, hot flashes or night sweats. Psych:  No mood changes, depression or anxiety. Pain:  No focal pain. Review of systems:  All other systems reviewed and found to be negative.  Physical Exam: Blood pressure 142/85, pulse 74, temperature 98.1 F (36.7 C), temperature source Tympanic, resp. rate  18, height $RemoveBe'5\' 3"'qlLKptfSf$  (1.6 m), weight 183 lb 13.8 oz (83.4 kg), SpO2 100 %. GENERAL:  Well developed, well nourished, woman sitting comfortably in the exam room in no acute distress. MENTAL STATUS:  Alert and oriented to person, place and time. HEAD:  Shoulder length wavy brown wig.  Normocephalic, atraumatic, face symmetric, no Cushingoid features. EYES:  Glasses.  Brown eyes.  Pupils equal round and reactive to light and accomodation.  No conjunctivitis or scleral icterus. ENT:  Oropharynx clear without lesion.  Upper dentures.  Tongue normal. Mucous membranes moist.  RESPIRATORY:  Clear to auscultation without rales, wheezes or rhonchi. CARDIOVASCULAR:  Regular rate and rhythm without murmur, rub or gallop. ABDOMEN:  Soft, non-tender, with active bowel sounds, and no hepatosplenomegaly.  No masses. SKIN:  No rashes, ulcers or lesions. EXTREMITIES: No edema, no skin discoloration or tenderness.  No palpable cords. LYMPH NODES: No palpable cervical, supraclavicular, axillary or inguinal adenopathy  NEUROLOGICAL: Unremarkable. PSYCH:  Appropriate.  No visits with results within 3 Day(s) from this visit. Latest known visit with results is:  Appointment on 08/16/2015  Component Date Value Ref Range Status  . Hemoglobin 08/16/2015 9.6* 12.0 - 16.0 g/dL Final    Assessment:  Kathleen Sharp is a 69 y.o. female with stage V chronic kidney disease secondary to hypertension.  Creatinine has ranged between 4.72 and 5.29 over the past several months.  She has been on oral iron for over a year with only slight improvement in her hematocrit.  Hemoglobin electrophoresis revealed sickle cell trait with a hemoglobin S of 32%.  She has never had a colonoscopy.  She denies any bleeding.  Diet is good.  Workup on 07/06/2015 revealed a hematocrit of 24.0, hemoglobin 7.9, MCV 76.4, platelets 375,000, WBC 9500 with an ANC 6000.  Ferritin was 109 with 18% saturation and TIBC 297 (normal).  B12, folate, TSH were  normal.  Erythropoietin level was 4.8.  She began weekly Procrit on 07/26/2015.  Hemoglobin improved from 7.4 to 9.6 in 3 weeks.  Procrit is held for Hgb > 11.  Symptomatically, she feels good.  Exam is unremarkable.  Hemoglobin is 9.2 today.  Plan: 1. Review work-up.  Discuss adequate iron stores.  Discuss discontinuation of oral iron. 2. Discuss decreasing frequency of Procrit injections from every week to every 2 weeks based on improvement in hemoglobin. 3. Labs today:  CBC with diff. 4. Procrit 10,000 units today. 5. RTC every 2 weeks for Hgb +/- Procrit. 6. RTC in 8 weeks for MD assessment and labs (CBC with diff) and +/- Procrit.   Lequita Asal, MD  08/30/2015, 10:33 AM

## 2015-08-30 NOTE — Progress Notes (Signed)
Patient here today for follow up anemia. Denies any problems today.

## 2015-09-13 ENCOUNTER — Inpatient Hospital Stay: Payer: Commercial Managed Care - HMO

## 2015-09-13 ENCOUNTER — Other Ambulatory Visit: Payer: Self-pay | Admitting: Hematology and Oncology

## 2015-09-13 VITALS — BP 144/81 | HR 71 | Temp 97.9°F

## 2015-09-13 DIAGNOSIS — Z79899 Other long term (current) drug therapy: Secondary | ICD-10-CM | POA: Diagnosis not present

## 2015-09-13 DIAGNOSIS — D631 Anemia in chronic kidney disease: Secondary | ICD-10-CM

## 2015-09-13 DIAGNOSIS — N185 Chronic kidney disease, stage 5: Secondary | ICD-10-CM | POA: Diagnosis not present

## 2015-09-13 DIAGNOSIS — Z87891 Personal history of nicotine dependence: Secondary | ICD-10-CM | POA: Diagnosis not present

## 2015-09-13 DIAGNOSIS — D573 Sickle-cell trait: Secondary | ICD-10-CM | POA: Diagnosis not present

## 2015-09-13 DIAGNOSIS — I129 Hypertensive chronic kidney disease with stage 1 through stage 4 chronic kidney disease, or unspecified chronic kidney disease: Secondary | ICD-10-CM | POA: Diagnosis not present

## 2015-09-13 LAB — HEMOGLOBIN: Hemoglobin: 9.5 g/dL — ABNORMAL LOW (ref 12.0–16.0)

## 2015-09-13 MED ORDER — EPOETIN ALFA 10000 UNIT/ML IJ SOLN
10000.0000 [IU] | Freq: Once | INTRAMUSCULAR | Status: AC
  Administered 2015-09-13: 10000 [IU] via SUBCUTANEOUS
  Filled 2015-09-13: qty 2

## 2015-09-19 DIAGNOSIS — N2581 Secondary hyperparathyroidism of renal origin: Secondary | ICD-10-CM | POA: Diagnosis not present

## 2015-09-19 DIAGNOSIS — I10 Essential (primary) hypertension: Secondary | ICD-10-CM | POA: Diagnosis not present

## 2015-09-19 DIAGNOSIS — D631 Anemia in chronic kidney disease: Secondary | ICD-10-CM | POA: Diagnosis not present

## 2015-09-19 DIAGNOSIS — E872 Acidosis: Secondary | ICD-10-CM | POA: Diagnosis not present

## 2015-09-19 DIAGNOSIS — N185 Chronic kidney disease, stage 5: Secondary | ICD-10-CM | POA: Diagnosis not present

## 2015-09-20 DIAGNOSIS — E872 Acidosis: Secondary | ICD-10-CM | POA: Diagnosis not present

## 2015-09-20 DIAGNOSIS — D631 Anemia in chronic kidney disease: Secondary | ICD-10-CM | POA: Diagnosis not present

## 2015-09-20 DIAGNOSIS — N185 Chronic kidney disease, stage 5: Secondary | ICD-10-CM | POA: Diagnosis not present

## 2015-09-20 DIAGNOSIS — N2581 Secondary hyperparathyroidism of renal origin: Secondary | ICD-10-CM | POA: Diagnosis not present

## 2015-09-27 ENCOUNTER — Inpatient Hospital Stay: Payer: Commercial Managed Care - HMO

## 2015-09-27 ENCOUNTER — Other Ambulatory Visit: Payer: Self-pay | Admitting: Hematology and Oncology

## 2015-09-27 ENCOUNTER — Inpatient Hospital Stay: Payer: Commercial Managed Care - HMO | Attending: Hematology and Oncology

## 2015-09-27 DIAGNOSIS — N185 Chronic kidney disease, stage 5: Secondary | ICD-10-CM | POA: Insufficient documentation

## 2015-09-27 DIAGNOSIS — D573 Sickle-cell trait: Secondary | ICD-10-CM | POA: Insufficient documentation

## 2015-09-27 DIAGNOSIS — I129 Hypertensive chronic kidney disease with stage 1 through stage 4 chronic kidney disease, or unspecified chronic kidney disease: Secondary | ICD-10-CM | POA: Diagnosis not present

## 2015-09-27 DIAGNOSIS — D631 Anemia in chronic kidney disease: Secondary | ICD-10-CM | POA: Diagnosis not present

## 2015-09-27 LAB — HEMOGLOBIN: Hemoglobin: 11.1 g/dL — ABNORMAL LOW (ref 12.0–16.0)

## 2015-10-11 ENCOUNTER — Inpatient Hospital Stay: Payer: Commercial Managed Care - HMO

## 2015-10-13 ENCOUNTER — Other Ambulatory Visit: Payer: Commercial Managed Care - HMO

## 2015-10-13 ENCOUNTER — Ambulatory Visit: Payer: Commercial Managed Care - HMO

## 2015-10-16 DIAGNOSIS — E785 Hyperlipidemia, unspecified: Secondary | ICD-10-CM | POA: Diagnosis not present

## 2015-10-16 DIAGNOSIS — N185 Chronic kidney disease, stage 5: Secondary | ICD-10-CM | POA: Diagnosis not present

## 2015-10-16 DIAGNOSIS — E669 Obesity, unspecified: Secondary | ICD-10-CM | POA: Diagnosis not present

## 2015-10-16 DIAGNOSIS — I10 Essential (primary) hypertension: Secondary | ICD-10-CM | POA: Diagnosis not present

## 2015-10-18 ENCOUNTER — Inpatient Hospital Stay: Payer: Commercial Managed Care - HMO

## 2015-10-18 ENCOUNTER — Inpatient Hospital Stay: Payer: Commercial Managed Care - HMO | Attending: Hematology and Oncology

## 2015-10-18 VITALS — BP 129/85 | HR 80 | Temp 97.5°F | Resp 20

## 2015-10-18 DIAGNOSIS — D631 Anemia in chronic kidney disease: Secondary | ICD-10-CM

## 2015-10-18 DIAGNOSIS — D573 Sickle-cell trait: Secondary | ICD-10-CM | POA: Insufficient documentation

## 2015-10-18 DIAGNOSIS — Z992 Dependence on renal dialysis: Secondary | ICD-10-CM | POA: Diagnosis not present

## 2015-10-18 DIAGNOSIS — N185 Chronic kidney disease, stage 5: Principal | ICD-10-CM

## 2015-10-18 DIAGNOSIS — Z79899 Other long term (current) drug therapy: Secondary | ICD-10-CM | POA: Diagnosis not present

## 2015-10-18 DIAGNOSIS — Z87891 Personal history of nicotine dependence: Secondary | ICD-10-CM | POA: Diagnosis not present

## 2015-10-18 DIAGNOSIS — I120 Hypertensive chronic kidney disease with stage 5 chronic kidney disease or end stage renal disease: Secondary | ICD-10-CM | POA: Insufficient documentation

## 2015-10-18 LAB — CBC WITH DIFFERENTIAL/PLATELET
Basophils Absolute: 0.1 10*3/uL (ref 0–0.1)
Basophils Relative: 1 %
Eosinophils Absolute: 0.2 10*3/uL (ref 0–0.7)
Eosinophils Relative: 2 %
HCT: 29.5 % — ABNORMAL LOW (ref 35.0–47.0)
Hemoglobin: 9.5 g/dL — ABNORMAL LOW (ref 12.0–16.0)
Lymphocytes Relative: 26 %
Lymphs Abs: 2.6 10*3/uL (ref 1.0–3.6)
MCH: 24.3 pg — ABNORMAL LOW (ref 26.0–34.0)
MCHC: 32.2 g/dL (ref 32.0–36.0)
MCV: 75.5 fL — ABNORMAL LOW (ref 80.0–100.0)
Monocytes Absolute: 0.8 10*3/uL (ref 0.2–0.9)
Monocytes Relative: 8 %
Neutro Abs: 6.1 10*3/uL (ref 1.4–6.5)
Neutrophils Relative %: 63 %
Platelets: 330 10*3/uL (ref 150–440)
RBC: 3.91 MIL/uL (ref 3.80–5.20)
RDW: 14.6 % — ABNORMAL HIGH (ref 11.5–14.5)
WBC: 9.7 10*3/uL (ref 3.6–11.0)

## 2015-10-18 MED ORDER — EPOETIN ALFA 10000 UNIT/ML IJ SOLN
10000.0000 [IU] | Freq: Once | INTRAMUSCULAR | Status: AC
  Administered 2015-10-18: 10000 [IU] via SUBCUTANEOUS
  Filled 2015-10-18: qty 2

## 2015-10-25 ENCOUNTER — Ambulatory Visit: Payer: Commercial Managed Care - HMO

## 2015-10-25 ENCOUNTER — Other Ambulatory Visit: Payer: Commercial Managed Care - HMO

## 2015-10-25 ENCOUNTER — Ambulatory Visit: Payer: Commercial Managed Care - HMO | Admitting: Hematology and Oncology

## 2015-10-25 DIAGNOSIS — I10 Essential (primary) hypertension: Secondary | ICD-10-CM | POA: Diagnosis not present

## 2015-10-25 DIAGNOSIS — N185 Chronic kidney disease, stage 5: Secondary | ICD-10-CM | POA: Diagnosis not present

## 2015-10-25 DIAGNOSIS — D631 Anemia in chronic kidney disease: Secondary | ICD-10-CM | POA: Diagnosis not present

## 2015-10-25 DIAGNOSIS — E872 Acidosis: Secondary | ICD-10-CM | POA: Diagnosis not present

## 2015-10-25 DIAGNOSIS — N2581 Secondary hyperparathyroidism of renal origin: Secondary | ICD-10-CM | POA: Diagnosis not present

## 2015-10-26 DIAGNOSIS — N2581 Secondary hyperparathyroidism of renal origin: Secondary | ICD-10-CM | POA: Diagnosis not present

## 2015-10-26 DIAGNOSIS — I10 Essential (primary) hypertension: Secondary | ICD-10-CM | POA: Diagnosis not present

## 2015-10-26 DIAGNOSIS — D631 Anemia in chronic kidney disease: Secondary | ICD-10-CM | POA: Diagnosis not present

## 2015-10-26 DIAGNOSIS — N185 Chronic kidney disease, stage 5: Secondary | ICD-10-CM | POA: Diagnosis not present

## 2015-11-01 ENCOUNTER — Inpatient Hospital Stay: Payer: Commercial Managed Care - HMO

## 2015-11-01 ENCOUNTER — Inpatient Hospital Stay (HOSPITAL_BASED_OUTPATIENT_CLINIC_OR_DEPARTMENT_OTHER): Payer: Commercial Managed Care - HMO | Admitting: Hematology and Oncology

## 2015-11-01 VITALS — BP 146/84 | HR 69 | Temp 97.9°F | Resp 18 | Ht 63.0 in | Wt 180.8 lb

## 2015-11-01 DIAGNOSIS — N185 Chronic kidney disease, stage 5: Secondary | ICD-10-CM | POA: Diagnosis not present

## 2015-11-01 DIAGNOSIS — D573 Sickle-cell trait: Secondary | ICD-10-CM

## 2015-11-01 DIAGNOSIS — D631 Anemia in chronic kidney disease: Secondary | ICD-10-CM

## 2015-11-01 DIAGNOSIS — Z79899 Other long term (current) drug therapy: Secondary | ICD-10-CM

## 2015-11-01 DIAGNOSIS — I120 Hypertensive chronic kidney disease with stage 5 chronic kidney disease or end stage renal disease: Secondary | ICD-10-CM | POA: Diagnosis not present

## 2015-11-01 DIAGNOSIS — Z992 Dependence on renal dialysis: Secondary | ICD-10-CM

## 2015-11-01 DIAGNOSIS — Z87891 Personal history of nicotine dependence: Secondary | ICD-10-CM

## 2015-11-01 LAB — CBC WITH DIFFERENTIAL/PLATELET
Basophils Absolute: 0 10*3/uL (ref 0–0.1)
Basophils Relative: 1 %
Eosinophils Absolute: 0.1 10*3/uL (ref 0–0.7)
Eosinophils Relative: 2 %
HCT: 31.5 % — ABNORMAL LOW (ref 35.0–47.0)
Hemoglobin: 9.8 g/dL — ABNORMAL LOW (ref 12.0–16.0)
Lymphocytes Relative: 26 %
Lymphs Abs: 2.4 10*3/uL (ref 1.0–3.6)
MCH: 23.9 pg — ABNORMAL LOW (ref 26.0–34.0)
MCHC: 31 g/dL — ABNORMAL LOW (ref 32.0–36.0)
MCV: 77.1 fL — ABNORMAL LOW (ref 80.0–100.0)
Monocytes Absolute: 0.8 10*3/uL (ref 0.2–0.9)
Monocytes Relative: 9 %
Neutro Abs: 5.7 10*3/uL (ref 1.4–6.5)
Neutrophils Relative %: 62 %
Platelets: 314 10*3/uL (ref 150–440)
RBC: 4.09 MIL/uL (ref 3.80–5.20)
RDW: 15.5 % — ABNORMAL HIGH (ref 11.5–14.5)
WBC: 9.1 10*3/uL (ref 3.6–11.0)

## 2015-11-01 NOTE — Progress Notes (Signed)
[facility] day:  11/01/2015  Chief Complaint: Kathleen Sharp is a 70 y.o. female with stage V chronic kidney disease who is seen for 2 month assessment on Procrit.  HPI: The patient was last seen in the medical oncology clinic on 08/30/2015.  At that time, she was seen for 6 week assessment on Procrit.  During the interim, she has done well.  She received Procrit 10,000 units on 08/30/2015 (Hgb 9.2), 09/13/2015 (Hgb 9.5), and 10/18/2015 (Hgb 9.5).  She missed an appointment.  She did not receive Procrit on 09/27/2015 as her hemoglobin was 11.1 (above cut-off).    She has not started dialysis.  She has decided on peritoneal dialysis.    Symptomatically, she feels good.  She denies any complaint.  Past Medical History  Diagnosis Date  . Anemia   . Chronic kidney disease     Past Surgical History  Procedure Laterality Date  . Abdominal hysterectomy      Family History  Problem Relation Age of Onset  . Kidney disease Father   . Kidney disease Brother   . Heart disease Mother     Social History:  reports that she quit smoking about 3 years ago. Her smoking use included Cigarettes. She has a 15 pack-year smoking history. She does not have any smokeless tobacco history on file. She reports that she does not drink alcohol or use illicit drugs.  The patient is alone today.  Allergies: No Known Allergies  Current Medications: Current Outpatient Prescriptions  Medication Sig Dispense Refill  . atorvastatin (LIPITOR) 20 MG tablet Take by mouth.    . calcitRIOL (ROCALTROL) 0.25 MCG capsule Take by mouth.    Marland Kitchen lisinopril-hydrochlorothiazide (PRINZIDE,ZESTORETIC) 20-25 MG tablet Take by mouth.    Marland Kitchen NIFEdipine (PROCARDIA-XL/ADALAT CC) 60 MG 24 hr tablet Take by mouth.    . sodium chloride 1 G tablet Take 1 g by mouth once.     No current facility-administered medications for this visit.   Facility-Administered Medications Ordered in  Other Visits  Medication Dose Route Frequency Provider Last Rate Last Dose  . epoetin alfa (EPOGEN,PROCRIT) injection 10,000 Units  10,000 Units Subcutaneous Once Lequita Asal, MD   10,000 Units at 08/16/15 1212    Review of Systems:  GENERAL:  Feels good.  No fevers, sweats or weight loss. PERFORMANCE STATUS (ECOG):  1 HEENT:  No visual changes, runny nose, sore throat, mouth sores or tenderness. Lungs: No shortness of breath or cough.  No hemoptysis. Cardiac:  No chest pain, palpitations, orthopnea, or PND. GI:  No nausea, vomiting, diarrhea, constipation, melena or hematochezia. GU:  No urgency, frequency, dysuria, or hematuria.  Dialysis starts soon. Musculoskeletal:  No back pain.  No joint pain.  No muscle tenderness. Extremities:  No pain or swelling. Skin:  No rashes or skin changes. Neuro:  No headache, numbness or weakness, balance or coordination issues. Endocrine:  No diabetes, thyroid issues, hot flashes or night sweats. Psych:  No mood changes, depression or anxiety. Pain:  No focal pain. Review of systems:  All other systems reviewed and found to be negative.  Physical Exam: Blood pressure 146/84, pulse 69, temperature 97.9 F (36.6 C), temperature source Tympanic, resp. rate 18, height $RemoveBe'5\' 3"'rKPoiDGkg$  (1.6 m), weight 180 lb 12.4 oz (82 kg). GENERAL:  Well developed, well nourished, woman sitting comfortably in the exam room in no acute distress. MENTAL STATUS:  Alert and oriented to person, place and time. HEAD: Wavy  brown hair.  Normocephalic, atraumatic, face symmetric, no Cushingoid features. EYES:  Glasses.  Brown eyes.  Pupils equal round and reactive to light and accomodation.  No conjunctivitis or scleral icterus. ENT:  Oropharynx clear without lesion.  Upper dentures.  Tongue normal. Mucous membranes moist.  RESPIRATORY:  Clear to auscultation without rales, wheezes or rhonchi. CARDIOVASCULAR:  Regular rate and rhythm without murmur, rub or gallop. ABDOMEN:  Soft,  non-tender, with active bowel sounds, and no hepatosplenomegaly.  No masses. SKIN:  No rashes, ulcers or lesions. EXTREMITIES: No edema, no skin discoloration or tenderness.  No palpable cords. LYMPH NODES: No palpable cervical, supraclavicular, axillary or inguinal adenopathy  NEUROLOGICAL: Unremarkable. PSYCH:  Appropriate.  Appointment on 11/01/2015  Component Date Value Ref Range Status  . WBC 11/01/2015 9.1  3.6 - 11.0 K/uL Final  . RBC 11/01/2015 4.09  3.80 - 5.20 MIL/uL Final  . Hemoglobin 11/01/2015 9.8* 12.0 - 16.0 g/dL Final  . HCT 11/01/2015 31.5* 35.0 - 47.0 % Final  . MCV 11/01/2015 77.1* 80.0 - 100.0 fL Final  . MCH 11/01/2015 23.9* 26.0 - 34.0 pg Final  . MCHC 11/01/2015 31.0* 32.0 - 36.0 g/dL Final  . RDW 11/01/2015 15.5* 11.5 - 14.5 % Final  . Platelets 11/01/2015 314  150 - 440 K/uL Final  . Neutrophils Relative % 11/01/2015 62   Final  . Neutro Abs 11/01/2015 5.7  1.4 - 6.5 K/uL Final  . Lymphocytes Relative 11/01/2015 26   Final  . Lymphs Abs 11/01/2015 2.4  1.0 - 3.6 K/uL Final  . Monocytes Relative 11/01/2015 9   Final  . Monocytes Absolute 11/01/2015 0.8  0.2 - 0.9 K/uL Final  . Eosinophils Relative 11/01/2015 2   Final  . Eosinophils Absolute 11/01/2015 0.1  0 - 0.7 K/uL Final  . Basophils Relative 11/01/2015 1   Final  . Basophils Absolute 11/01/2015 0.0  0 - 0.1 K/uL Final    Assessment:  Kathleen Sharp is a 70 y.o. female with stage V chronic kidney disease secondary to hypertension.  Creatinine has ranged between 4.72 and 5.29 over the past several months.  She has been on oral iron for over a year with only slight improvement in her hematocrit.  Hemoglobin electrophoresis revealed sickle cell trait with a hemoglobin S of 32%.  She has never had a colonoscopy.  She denies any bleeding.  Diet is good.  Workup on 07/06/2015 revealed a hematocrit of 24.0, hemoglobin 7.9, MCV 76.4, platelets 375,000, WBC 9500 with an ANC 6000.  Ferritin was 109 with 18%  saturation and TIBC 297 (normal).  B12, folate, TSH were normal.  Erythropoietin level was 4.8.  She began weekly Procrit on 07/26/2015.  Hemoglobin improved from 7.4 to 9.6 in 3 weeks.  Procrit was decreased to every 2 weeks.  She required a dose being held on 09/27/2015.  Procrit is held for Hgb > 11.  Symptomatically, she feels good.  Exam is unremarkable.  Hematocrit is 31.5 with a hemoglobin of 9.8 today.  Plan: 1. Labs today:  CBC with diff 2. Discuss decreasing frequency of Procrit injections from every 2 weeks to every 3 weeks based on need to hold dose. 3. No Procrit today. 4. RTC in 1 week for Procrit. 5. RTC every 3 weeks for Hgb +/- Procrit. 6. RTC in 3 months for MD assessment and labs (CBC with diff) and +/- Procrit.   Lequita Asal, MD  11/01/2015, 12:17 PM

## 2015-11-02 ENCOUNTER — Encounter: Payer: Self-pay | Admitting: Hematology and Oncology

## 2015-11-08 ENCOUNTER — Other Ambulatory Visit: Payer: Self-pay | Admitting: Hematology and Oncology

## 2015-11-08 ENCOUNTER — Inpatient Hospital Stay: Payer: Commercial Managed Care - HMO | Attending: Hematology and Oncology

## 2015-11-08 ENCOUNTER — Inpatient Hospital Stay: Payer: Commercial Managed Care - HMO

## 2015-11-08 VITALS — BP 122/86 | HR 76 | Resp 20

## 2015-11-08 DIAGNOSIS — N185 Chronic kidney disease, stage 5: Principal | ICD-10-CM

## 2015-11-08 DIAGNOSIS — Z87891 Personal history of nicotine dependence: Secondary | ICD-10-CM | POA: Diagnosis not present

## 2015-11-08 DIAGNOSIS — Z79899 Other long term (current) drug therapy: Secondary | ICD-10-CM | POA: Diagnosis not present

## 2015-11-08 DIAGNOSIS — Z992 Dependence on renal dialysis: Secondary | ICD-10-CM | POA: Diagnosis not present

## 2015-11-08 DIAGNOSIS — D573 Sickle-cell trait: Secondary | ICD-10-CM | POA: Diagnosis not present

## 2015-11-08 DIAGNOSIS — D631 Anemia in chronic kidney disease: Secondary | ICD-10-CM | POA: Diagnosis not present

## 2015-11-08 DIAGNOSIS — I120 Hypertensive chronic kidney disease with stage 5 chronic kidney disease or end stage renal disease: Secondary | ICD-10-CM | POA: Diagnosis not present

## 2015-11-08 LAB — HEMOGLOBIN: Hemoglobin: 9.2 g/dL — ABNORMAL LOW (ref 12.0–16.0)

## 2015-11-08 MED ORDER — EPOETIN ALFA 10000 UNIT/ML IJ SOLN
10000.0000 [IU] | Freq: Once | INTRAMUSCULAR | Status: AC
  Administered 2015-11-08: 10000 [IU] via SUBCUTANEOUS
  Filled 2015-11-08: qty 2

## 2015-11-13 ENCOUNTER — Other Ambulatory Visit: Payer: Self-pay | Admitting: Vascular Surgery

## 2015-11-14 ENCOUNTER — Ambulatory Visit: Admission: RE | Admit: 2015-11-14 | Payer: Commercial Managed Care - HMO | Source: Ambulatory Visit

## 2015-11-14 ENCOUNTER — Encounter
Admission: RE | Admit: 2015-11-14 | Discharge: 2015-11-14 | Disposition: A | Discharge status: Home / Self Care | Payer: Commercial Managed Care - HMO | Source: Ambulatory Visit | Attending: Vascular Surgery | Admitting: Vascular Surgery

## 2015-11-14 ENCOUNTER — Ambulatory Visit
Admission: RE | Admit: 2015-11-14 | Discharge: 2015-11-14 | Disposition: A | Discharge status: Home / Self Care | Payer: Commercial Managed Care - HMO | Source: Ambulatory Visit | Attending: Vascular Surgery | Admitting: Vascular Surgery

## 2015-11-14 DIAGNOSIS — E785 Hyperlipidemia, unspecified: Secondary | ICD-10-CM | POA: Diagnosis not present

## 2015-11-14 DIAGNOSIS — R918 Other nonspecific abnormal finding of lung field: Secondary | ICD-10-CM | POA: Diagnosis not present

## 2015-11-14 DIAGNOSIS — Z01818 Encounter for other preprocedural examination: Secondary | ICD-10-CM | POA: Diagnosis not present

## 2015-11-14 DIAGNOSIS — E669 Obesity, unspecified: Secondary | ICD-10-CM | POA: Diagnosis not present

## 2015-11-14 DIAGNOSIS — I10 Essential (primary) hypertension: Secondary | ICD-10-CM | POA: Insufficient documentation

## 2015-11-14 DIAGNOSIS — Z01810 Encounter for preprocedural cardiovascular examination: Secondary | ICD-10-CM | POA: Diagnosis not present

## 2015-11-14 DIAGNOSIS — N185 Chronic kidney disease, stage 5: Secondary | ICD-10-CM | POA: Diagnosis not present

## 2015-11-14 DIAGNOSIS — J9811 Atelectasis: Secondary | ICD-10-CM | POA: Diagnosis not present

## 2015-11-14 HISTORY — DX: Unspecified osteoarthritis, unspecified site: M19.90

## 2015-11-14 HISTORY — DX: Pure hypercholesterolemia, unspecified: E78.00

## 2015-11-14 HISTORY — DX: Essential (primary) hypertension: I10

## 2015-11-14 LAB — CBC WITH DIFFERENTIAL/PLATELET
Basophils Absolute: 0 10*3/uL (ref 0–0.1)
Basophils Relative: 1 %
EOS ABS: 0 10*3/uL (ref 0–0.7)
Eosinophils Relative: 1 %
HCT: 31.8 % — ABNORMAL LOW (ref 35.0–47.0)
HEMOGLOBIN: 10.4 g/dL — AB (ref 12.0–16.0)
LYMPHS ABS: 1.7 10*3/uL (ref 1.0–3.6)
Lymphocytes Relative: 27 %
MCH: 24.1 pg — AB (ref 26.0–34.0)
MCHC: 32.6 g/dL (ref 32.0–36.0)
MCV: 74 fL — ABNORMAL LOW (ref 80.0–100.0)
MONO ABS: 1.1 10*3/uL — AB (ref 0.2–0.9)
MONOS PCT: 18 %
NEUTROS ABS: 3.2 10*3/uL (ref 1.4–6.5)
NEUTROS PCT: 53 %
Platelets: 376 10*3/uL (ref 150–440)
RBC: 4.3 MIL/uL (ref 3.80–5.20)
RDW: 15.8 % — AB (ref 11.5–14.5)
WBC: 6.1 10*3/uL (ref 3.6–11.0)

## 2015-11-14 LAB — BASIC METABOLIC PANEL
ANION GAP: 14 (ref 5–15)
BUN: 75 mg/dL — ABNORMAL HIGH (ref 6–20)
CO2: 21 mmol/L — ABNORMAL LOW (ref 22–32)
CREATININE: 7.16 mg/dL — AB (ref 0.44–1.00)
Calcium: 9.6 mg/dL (ref 8.9–10.3)
Chloride: 100 mmol/L — ABNORMAL LOW (ref 101–111)
GFR, EST AFRICAN AMERICAN: 6 mL/min — AB (ref >60)
GFR, EST NON AFRICAN AMERICAN: 5 mL/min — AB (ref >60)
Glucose, Bld: 90 mg/dL (ref 65–99)
Potassium: 3.8 mmol/L (ref 3.5–5.1)
SODIUM: 135 mmol/L (ref 135–145)

## 2015-11-14 LAB — ABO/RH: ABO/RH(D): O POS

## 2015-11-14 LAB — APTT: APTT: 31 s (ref 24–36)

## 2015-11-14 LAB — TYPE AND SCREEN
ABO/RH(D): O POS
Antibody Screen: NEGATIVE

## 2015-11-14 LAB — PROTIME-INR
INR: 1.05
PROTHROMBIN TIME: 13.9 s (ref 11.4–15.0)

## 2015-11-14 LAB — SURGICAL PCR SCREEN
MRSA, PCR: NEGATIVE
STAPHYLOCOCCUS AUREUS: NEGATIVE

## 2015-11-14 NOTE — Pre-Procedure Instructions (Signed)
Pre-op EKG faxed to Dr. Leonia Reader, and  Dr Delana Meyer, and requested PCP to review for surgery.

## 2015-11-14 NOTE — Pre-Procedure Instructions (Signed)
Pre-op EKG called to Dr. Ronelle Nigh, and requested PCP review for surgery.

## 2015-11-14 NOTE — Patient Instructions (Signed)
  Your procedure is scheduled on: November 24, 2015 (Friday) Report to Day Surgery.[facility]) Second Floor To find out your arrival time please call [phone] between 1PM - 3PM on November 23, 2015 (Thursday).  Remember: Instructions that are not followed completely may result in serious medical risk, up to and including death, or upon the discretion of your surgeon and anesthesiologist your surgery may need to be rescheduled.    __x__ 1. Do not eat food or drink liquids after midnight. No gum chewing or hard candies.     __x__ 2. No Alcohol for 24 hours before or after surgery.   ____ 3. Bring all medications with you on the day of surgery if instructed.    ___x_ 4. Notify your doctor if there is any change in your medical condition     (cold, fever, infections).     Do not wear jewelry, make-up, hairpins, clips or nail polish.  Do not wear lotions, powders, or perfumes. You may wear deodorant.  Do not shave 48 hours prior to surgery. Men may shave face and neck.  Do not bring valuables to the hospital.    [facility] is not responsible for any belongings or valuables.               Contacts, dentures or bridgework may not be worn into surgery.  Leave your suitcase in the car. After surgery it may be brought to your room.  For patients admitted to the hospital, discharge time is determined by your                treatment team.   Patients discharged the day of surgery will not be allowed to drive home.   Please read over the following fact sheets that you were given:   MRSA Information and Surgical Site Infection Prevention   ____ Take these medicines the morning of surgery with A SIP OF WATER:    1.   2.   3.   4.  5.  6.  ____ Fleet Enema (as directed)   _x___ Use CHG Soap as directed  ____ Use inhalers on the day of surgery  ____ Stop metformin 2 days prior to surgery    ____ Take 1/2 of usual insulin dose the night before surgery and none on the morning of  surgery.   __x__ Stop Coumadin/Plavix/aspirin on (DO NOT TAKE ASPIRIN THE MORNING OF SURGERY)  __X__ Stop Anti-inflammatories on (NO NSAIDS)   ____ Stop supplements until after surgery.    ____ Bring C-Pap to the hospital.

## 2015-11-14 NOTE — Pre-Procedure Instructions (Signed)
CXR results sent to Dr. Delana Meyer and anesthesia for review.

## 2015-11-15 NOTE — Pre-Procedure Instructions (Signed)
FAXED CXR TO DR Leonia Reader TO ADDRESS AS PART OF MEDICAL CLEARANCE

## 2015-11-20 DIAGNOSIS — E784 Other hyperlipidemia: Secondary | ICD-10-CM | POA: Diagnosis not present

## 2015-11-20 DIAGNOSIS — R9431 Abnormal electrocardiogram [ECG] [EKG]: Secondary | ICD-10-CM | POA: Diagnosis not present

## 2015-11-20 DIAGNOSIS — E669 Obesity, unspecified: Secondary | ICD-10-CM | POA: Diagnosis not present

## 2015-11-20 DIAGNOSIS — Z01818 Encounter for other preprocedural examination: Secondary | ICD-10-CM | POA: Diagnosis not present

## 2015-11-20 DIAGNOSIS — Z87898 Personal history of other specified conditions: Secondary | ICD-10-CM | POA: Diagnosis not present

## 2015-11-20 DIAGNOSIS — R011 Cardiac murmur, unspecified: Secondary | ICD-10-CM | POA: Diagnosis not present

## 2015-11-20 DIAGNOSIS — F419 Anxiety disorder, unspecified: Secondary | ICD-10-CM | POA: Diagnosis not present

## 2015-11-20 DIAGNOSIS — N186 End stage renal disease: Secondary | ICD-10-CM | POA: Diagnosis not present

## 2015-11-20 DIAGNOSIS — D631 Anemia in chronic kidney disease: Secondary | ICD-10-CM | POA: Diagnosis not present

## 2015-11-20 NOTE — Pre-Procedure Instructions (Signed)
SPOKE WITH URELLIE AT DR Autumn Patty RE STATUS OF CLEARANCE

## 2015-11-21 DIAGNOSIS — R011 Cardiac murmur, unspecified: Secondary | ICD-10-CM | POA: Diagnosis not present

## 2015-11-21 DIAGNOSIS — R9431 Abnormal electrocardiogram [ECG] [EKG]: Secondary | ICD-10-CM | POA: Diagnosis not present

## 2015-11-21 DIAGNOSIS — Z01818 Encounter for other preprocedural examination: Secondary | ICD-10-CM | POA: Diagnosis not present

## 2015-11-23 NOTE — Pre-Procedure Instructions (Signed)
NM myocardial perfusion SPECT multiple (stress and rest) (11/21/2015 2:00 PM) NM myocardial perfusion SPECT multiple (stress and rest) (11/21/2015 2:00 PM)  Impressions  Normal myocardial perfusion scan no evidence of stress-induced   myocardial ischemia ejection fraction of 69% conclusion negative scan.  Patient should be an acceptable surgical risk.          NM myocardial perfusion SPECT multiple (stress and rest) (11/21/2015 2:00 PM)  Narrative  Procedure: Pharmacologic Myocardial Perfusion Imaging  ONE day procedure    Indication: Abnormal ECG - Plan: NM myocardial perfusion SPECT multiple   (stress and rest), ECG stress test only    Pre-operative examination - Plan: NM myocardial perfusion SPECT multiple   (stress and rest), ECG stress test only  Ordering Physician:     Dr. Lujean Amel      Clinical History:  70 y.o. year old female recent anginal symptoms preop  Vitals: Height: 62 in Weight: 176 lb  Cardiac risk factors include:    Hyperlipidemia and HTN       Procedure:    Pharmacologic stress testing was performed with Regadenoson using a single   use 0.$Remo'4mg'ePNtl$ /61ml (0.08 mg/ml) prefilled syringe intravenously infused as a   bolus dose. The stress test was stopped due to Infusion completion.Blood   pressure response was normal.     Rest HR: 76bpm  Rest BP: 144/78mmHg  Max HR: 101bpm  Min BP: 124/48mmHg    Stress Test Administered by: Oswald Hillock, CMA    ECG Interpretation:  Rest PVV:ZSMOLM sinus rhythm, none  Stress BEM:LJQGBE sinus rhythm,   Recovery EFE:OFHQRF sinus rhythm  ECG Interpretation:non-diagnostic due to pharmacologic testing.

## 2015-11-24 ENCOUNTER — Encounter
Admission: RE | Disposition: A | Discharge status: Home / Self Care | Payer: Self-pay | Source: Ambulatory Visit | Attending: Vascular Surgery

## 2015-11-24 ENCOUNTER — Ambulatory Visit
Admission: RE | Admit: 2015-11-24 | Discharge: 2015-11-24 | Disposition: A | Discharge status: Home / Self Care | Payer: Commercial Managed Care - HMO | Source: Ambulatory Visit | Attending: Vascular Surgery | Admitting: Vascular Surgery

## 2015-11-24 ENCOUNTER — Ambulatory Visit: Payer: Commercial Managed Care - HMO | Admitting: Anesthesiology

## 2015-11-24 ENCOUNTER — Encounter: Payer: Self-pay | Admitting: *Deleted

## 2015-11-24 DIAGNOSIS — E669 Obesity, unspecified: Secondary | ICD-10-CM | POA: Diagnosis not present

## 2015-11-24 DIAGNOSIS — E7800 Pure hypercholesterolemia, unspecified: Secondary | ICD-10-CM | POA: Diagnosis not present

## 2015-11-24 DIAGNOSIS — F17210 Nicotine dependence, cigarettes, uncomplicated: Secondary | ICD-10-CM | POA: Insufficient documentation

## 2015-11-24 DIAGNOSIS — Z01810 Encounter for preprocedural cardiovascular examination: Secondary | ICD-10-CM | POA: Diagnosis not present

## 2015-11-24 DIAGNOSIS — M1990 Unspecified osteoarthritis, unspecified site: Secondary | ICD-10-CM | POA: Insufficient documentation

## 2015-11-24 DIAGNOSIS — Z90710 Acquired absence of both cervix and uterus: Secondary | ICD-10-CM | POA: Insufficient documentation

## 2015-11-24 DIAGNOSIS — D649 Anemia, unspecified: Secondary | ICD-10-CM | POA: Insufficient documentation

## 2015-11-24 DIAGNOSIS — I120 Hypertensive chronic kidney disease with stage 5 chronic kidney disease or end stage renal disease: Secondary | ICD-10-CM | POA: Insufficient documentation

## 2015-11-24 DIAGNOSIS — Z8249 Family history of ischemic heart disease and other diseases of the circulatory system: Secondary | ICD-10-CM | POA: Diagnosis not present

## 2015-11-24 DIAGNOSIS — E785 Hyperlipidemia, unspecified: Secondary | ICD-10-CM | POA: Insufficient documentation

## 2015-11-24 DIAGNOSIS — I10 Essential (primary) hypertension: Secondary | ICD-10-CM | POA: Diagnosis not present

## 2015-11-24 DIAGNOSIS — N186 End stage renal disease: Secondary | ICD-10-CM | POA: Diagnosis not present

## 2015-11-24 HISTORY — PX: CAPD INSERTION: SHX5233

## 2015-11-24 SURGERY — LAPAROSCOPIC INSERTION CONTINUOUS AMBULATORY PERITONEAL DIALYSIS  (CAPD) CATHETER
Anesthesia: General | Wound class: Clean

## 2015-11-24 MED ORDER — ROCURONIUM BROMIDE 100 MG/10ML IV SOLN
INTRAVENOUS | Status: DC | PRN
  Administered 2015-11-24: 10 mg via INTRAVENOUS
  Administered 2015-11-24: 25 mg via INTRAVENOUS
  Administered 2015-11-24: 10 mg via INTRAVENOUS

## 2015-11-24 MED ORDER — PHENYLEPHRINE HCL 10 MG/ML IJ SOLN
INTRAMUSCULAR | Status: DC | PRN
  Administered 2015-11-24 (×2): 100 ug via INTRAVENOUS
  Administered 2015-11-24 (×2): 50 ug via INTRAVENOUS

## 2015-11-24 MED ORDER — ONDANSETRON HCL 4 MG/2ML IJ SOLN: 4.0000 mg | Freq: Once | INTRAMUSCULAR | Status: DC | PRN

## 2015-11-24 MED ORDER — LIDOCAINE HCL (CARDIAC) 20 MG/ML IV SOLN
INTRAVENOUS | Status: DC | PRN
  Administered 2015-11-24: 80 mg via INTRAVENOUS

## 2015-11-24 MED ORDER — FAMOTIDINE 20 MG PO TABS
20.0000 mg | ORAL_TABLET | Freq: Once | ORAL | Status: AC
  Administered 2015-11-24: 20 mg via ORAL

## 2015-11-24 MED ORDER — CEFAZOLIN SODIUM-DEXTROSE 2-3 GM-% IV SOLR
2.0000 g | INTRAVENOUS | Status: AC
  Administered 2015-11-24: 2 g via INTRAVENOUS

## 2015-11-24 MED ORDER — BUPIVACAINE-EPINEPHRINE (PF) 0.25% -1:200000 IJ SOLN
INTRAMUSCULAR | Status: DC | PRN
  Administered 2015-11-24: 23 mL

## 2015-11-24 MED ORDER — SODIUM CHLORIDE 0.9 % IV SOLN
INTRAVENOUS | Status: DC
  Administered 2015-11-24: 07:00:00 via INTRAVENOUS

## 2015-11-24 MED ORDER — ONDANSETRON HCL 4 MG/2ML IJ SOLN
INTRAMUSCULAR | Status: DC | PRN
  Administered 2015-11-24: 4 mg via INTRAVENOUS

## 2015-11-24 MED ORDER — BUPIVACAINE-EPINEPHRINE (PF) 0.25% -1:200000 IJ SOLN
INTRAMUSCULAR | Status: AC
  Filled 2015-11-24: qty 30

## 2015-11-24 MED ORDER — FAMOTIDINE 20 MG PO TABS
ORAL_TABLET | ORAL | Status: AC
  Administered 2015-11-24: 20 mg via ORAL
  Filled 2015-11-24: qty 1

## 2015-11-24 MED ORDER — NEOSTIGMINE METHYLSULFATE 10 MG/10ML IV SOLN
INTRAVENOUS | Status: DC | PRN
  Administered 2015-11-24: 5 mg via INTRAVENOUS
  Administered 2015-11-24: 1 mg via INTRAVENOUS

## 2015-11-24 MED ORDER — FENTANYL CITRATE (PF) 100 MCG/2ML IJ SOLN
INTRAMUSCULAR | Status: DC | PRN
  Administered 2015-11-24 (×2): 50 ug via INTRAVENOUS

## 2015-11-24 MED ORDER — FENTANYL CITRATE (PF) 100 MCG/2ML IJ SOLN: 25.0000 ug | INTRAMUSCULAR | Status: DC | PRN

## 2015-11-24 MED ORDER — PROPOFOL 10 MG/ML IV BOLUS
INTRAVENOUS | Status: DC | PRN
  Administered 2015-11-24: 100 mg via INTRAVENOUS

## 2015-11-24 MED ORDER — HYDROCODONE-ACETAMINOPHEN 5-325 MG PO TABS: 1.0000 | ORAL_TABLET | Freq: Four times a day (QID) | ORAL | Status: DC | PRN

## 2015-11-24 MED ORDER — SUGAMMADEX SODIUM 200 MG/2ML IV SOLN
INTRAVENOUS | Status: DC | PRN
  Administered 2015-11-24: 159.6 mg via INTRAVENOUS

## 2015-11-24 MED ORDER — CEFAZOLIN SODIUM-DEXTROSE 2-3 GM-% IV SOLR
INTRAVENOUS | Status: AC
  Administered 2015-11-24: 2 g via INTRAVENOUS
  Filled 2015-11-24: qty 50

## 2015-11-24 MED ORDER — GLYCOPYRROLATE 0.2 MG/ML IJ SOLN
INTRAMUSCULAR | Status: DC | PRN
  Administered 2015-11-24: .8 mg via INTRAVENOUS

## 2015-11-24 SURGICAL SUPPLY — 41 items
ADAPTER CATH DIALYSIS 18.75 (CATHETERS) ×2 IMPLANT
ADAPTER CATH DIALYSIS 18.75CM (CATHETERS) ×1
BLADE SURG 15 STRL LF DISP TIS (BLADE) ×1 IMPLANT
BLADE SURG 15 STRL SS (BLADE) ×2
CANISTER SUCT 1200ML W/VALVE (MISCELLANEOUS) ×3 IMPLANT
CATH DIALYSIS TI ADAPTER (ADAPTER) IMPLANT
CATH DLYS SWAN NECK 62.5CM (CATHETERS) ×3 IMPLANT
CHLORAPREP W/TINT 26ML (MISCELLANEOUS) ×3 IMPLANT
DIALYSIS CATH PD EXTEND (CATHETERS) IMPLANT
DIALYSIS CATH PD INTRO KIT (CATHETERS) IMPLANT
ELECT REM PT RETURN 9FT ADLT (ELECTROSURGICAL) ×3
ELECTRODE REM PT RTRN 9FT ADLT (ELECTROSURGICAL) ×1 IMPLANT
GAUZE SPONGE 4X4 12PLY STRL (GAUZE/BANDAGES/DRESSINGS) IMPLANT
GLOVE BIO SURGEON STRL SZ7 (GLOVE) ×9 IMPLANT
GLOVE INDICATOR 7.5 STRL GRN (GLOVE) ×3 IMPLANT
GLOVE SURG SYN 8.0 (GLOVE) ×3 IMPLANT
GOWN STRL REUS W/ TWL LRG LVL3 (GOWN DISPOSABLE) ×1 IMPLANT
GOWN STRL REUS W/ TWL XL LVL3 (GOWN DISPOSABLE) ×1 IMPLANT
GOWN STRL REUS W/TWL LRG LVL3 (GOWN DISPOSABLE) ×2
GOWN STRL REUS W/TWL XL LVL3 (GOWN DISPOSABLE) ×2
KIT RM TURNOVER STRD PROC AR (KITS) ×3 IMPLANT
LABEL OR SOLS (LABEL) ×3 IMPLANT
LIQUID BAND (GAUZE/BANDAGES/DRESSINGS) ×3 IMPLANT
MINICAP W/POVIDONE IODINE SOL (MISCELLANEOUS) ×3 IMPLANT
NEEDLE HYPO 25X1 1.5 SAFETY (NEEDLE) ×3 IMPLANT
NEEDLE INSUFFLATION 150MM (ENDOMECHANICALS) ×3 IMPLANT
NS IRRIG 500ML POUR BTL (IV SOLUTION) ×3 IMPLANT
PACK LAP CHOLECYSTECTOMY (MISCELLANEOUS) ×3 IMPLANT
PENCIL ELECTRO HAND CTR (MISCELLANEOUS) ×3 IMPLANT
SET TRANSFER 6 W/TWIST CLAMP 5 (SET/KITS/TRAYS/PACK) ×3 IMPLANT
SLEEVE ENDOPATH XCEL 5M (ENDOMECHANICALS) IMPLANT
SUT ETHIBOND 0 36 GRN (SUTURE) IMPLANT
SUT MNCRL AB 4-0 PS2 18 (SUTURE) ×3 IMPLANT
SUT SILK 0 (SUTURE) ×2
SUT SILK 0 30XBRD TIE 6 (SUTURE) ×1 IMPLANT
SUT VIC AB 3-0 SH 27 (SUTURE) ×2
SUT VIC AB 3-0 SH 27X BRD (SUTURE) ×1 IMPLANT
SUT VICRYL 0 AB UR-6 (SUTURE) ×3 IMPLANT
SYR 50ML LL SCALE MARK (SYRINGE) ×3 IMPLANT
TROCAR XCEL NON-BLD 5MMX100MML (ENDOMECHANICALS) ×3 IMPLANT
TUBING INSUFFLATOR HI FLOW (MISCELLANEOUS) ×3 IMPLANT

## 2015-11-24 NOTE — H&P (Signed)
[facility] History & Physical Update  The patient was interviewed and re-examined.  The patient's previous History and Physical has been reviewed and is unchanged.  There is no change in the plan of care. We plan to proceed with the scheduled procedure.  Schnier, Dolores Lory, MD  11/24/2015, 7:39 AM

## 2015-11-24 NOTE — Transfer of Care (Signed)
Immediate Anesthesia Transfer of Care Note  Patient: Kathleen Sharp  Procedure(s) Performed: Procedure(s): Exploratory laparoscopy (N/A)  Patient Location: PACU  Anesthesia Type:General  Level of Consciousness: awake, confused and responds to stimulation  Airway & Oxygen Therapy: Patient Spontanous Breathing and Patient connected to face mask oxygen  Post-op Assessment: Report given to RN and Post -op Vital signs reviewed and stable  Post vital signs: Reviewed and stable  Last Vitals:  Filed Vitals:   11/24/15 0612  BP: 107/77  Pulse: 80  Temp: 37 C  Resp: 20    Complications: No apparent anesthesia complications

## 2015-11-24 NOTE — Op Note (Signed)
[facility]   OPERATIVE NOTE  DATE: 11/24/2015  PRE-OPERATIVE DIAGNOSIS: Stage V renal insufficiency; remote history of open hysterectomy  POST-OPERATIVE DIAGNOSIS: Same  PROCEDURE: 1.   Laparoscopic exploration of the abdominal cavity  SURGEON: Schnier, Dolores Lory  ASSISTANT(S): Ms. Hezzie Bump  ANESTHESIA: Gen.  ESTIMATED BLOOD LOSS: Less than 10 cc  FINDING(S): 1.  Extensive essentially massive amount of adhesions  SPECIMEN(S):  None  INDICATIONS:   Kathleen Sharp is a 70 y.o. female who presents with stage V renal insufficiency. She does have a remote history of open hysterectomy. She presents today for insertion of a peritoneal dialysis catheter. The risks and benefits have been reviewed all questions answered patient agrees to proceed.  DESCRIPTION: After obtaining full informed written consent, the patient was brought back to the operating room and placed supine upon the operating table.  The patient received IV antibiotics prior to induction.  After obtaining adequate anesthesia, the patient was prepped and draped in the standard fashion for:   The patient was then anesthetized with half percent Marcaine with epinephrine. Small incision was made in the midline just above the umbilicus the dissection was carried down to expose the fascia. Fascia was grasped with a bone hook and a varies needle was introduced. Saline test was positive. Low-flow CO2 insufflation was performed and then this was switched to high flow and the abdominal cavity was pressurized to 15 mmHg.  5 mm trocar was introduced and the camera was advanced through the trocar. Extensive adhesions were noted in both the right and left lower quadrants especially within the midline. There appeared to be extensive omental adhesions down into the pelvis as well. This is not a suitable situation for PD catheter placement and therefore the case was terminated.  COMPLICATIONS:  None  CONDITION: Stable  [name]  11/24/2015, 8:56 AM

## 2015-11-24 NOTE — Anesthesia Procedure Notes (Signed)
Procedure Name: Intubation Performed by: Lance Muss Pre-anesthesia Checklist: Emergency Drugs available, Patient identified, Suction available, Patient being monitored and Timeout performed Patient Re-evaluated:Patient Re-evaluated prior to inductionOxygen Delivery Method: Circle system utilized Preoxygenation: Pre-oxygenation with 100% oxygen Intubation Type: IV induction Ventilation: Oral airway inserted - appropriate to patient size and Mask ventilation without difficulty Laryngoscope Size: Mac and 3 Grade View: Grade I Tube type: Oral Tube size: 7.0 mm Number of attempts: 1 Airway Equipment and Method: Stylet Placement Confirmation: ETT inserted through vocal cords under direct vision,  positive ETCO2 and breath sounds checked- equal and bilateral Secured at: 21 cm Tube secured with: Tape Dental Injury: Teeth and Oropharynx as per pre-operative assessment

## 2015-11-24 NOTE — Discharge Instructions (Signed)

## 2015-11-24 NOTE — Anesthesia Postprocedure Evaluation (Signed)
Anesthesia Post Note  Patient: Kathleen Sharp  Procedure(s) Performed: Procedure(s) (LRB): Exploratory laparoscopy (N/A)  Patient location during evaluation: PACU Anesthesia Type: General Level of consciousness: awake and alert Pain management: pain level controlled Vital Signs Assessment: post-procedure vital signs reviewed and stable Respiratory status: spontaneous breathing, nonlabored ventilation, respiratory function stable and patient connected to nasal cannula oxygen Cardiovascular status: blood pressure returned to baseline and stable Postop Assessment: no signs of nausea or vomiting Anesthetic complications: no    Last Vitals:  Filed Vitals:   11/24/15 0942 11/24/15 0955  BP: 93/71 105/77  Pulse:  75  Temp:  36.2 C  Resp: 14 13    Last Pain:  Filed Vitals:   11/24/15 0958  PainSc: 0-No pain                 Phung Kotas S

## 2015-11-24 NOTE — Anesthesia Preprocedure Evaluation (Addendum)
Anesthesia Evaluation  Patient identified by MRN, date of birth, ID band Patient awake    Reviewed: Allergy & Precautions, NPO status , Patient's Chart, lab work & pertinent test results, reviewed documented beta blocker date and time   Airway Mallampati: II  TM Distance: >3 FB     Dental  (+) Chipped, Upper Dentures, Partial Lower, Dental Advisory Given   Pulmonary former smoker,           Cardiovascular hypertension, Pt. on medications      Neuro/Psych    GI/Hepatic   Endo/Other    Renal/GU ESRFRenal disease     Musculoskeletal  (+) Arthritis ,   Abdominal   Peds  Hematology  (+) anemia ,   Anesthesia Other Findings   Reproductive/Obstetrics                            Anesthesia Physical Anesthesia Plan  ASA: III  Anesthesia Plan: General   Post-op Pain Management:    Induction: Intravenous  Airway Management Planned: Oral ETT  Additional Equipment:   Intra-op Plan:   Post-operative Plan:   Informed Consent: I have reviewed the patients History and Physical, chart, labs and discussed the procedure including the risks, benefits and alternatives for the proposed anesthesia with the patient or authorized representative who has indicated his/her understanding and acceptance.     Plan Discussed with: CRNA  Anesthesia Plan Comments:         Anesthesia Quick Evaluation

## 2015-11-29 ENCOUNTER — Inpatient Hospital Stay: Payer: Commercial Managed Care - HMO | Attending: Hematology and Oncology

## 2015-11-29 VITALS — BP 123/85 | HR 83 | Resp 20

## 2015-11-29 DIAGNOSIS — N185 Chronic kidney disease, stage 5: Principal | ICD-10-CM

## 2015-11-29 DIAGNOSIS — Z79899 Other long term (current) drug therapy: Secondary | ICD-10-CM | POA: Diagnosis not present

## 2015-11-29 DIAGNOSIS — I120 Hypertensive chronic kidney disease with stage 5 chronic kidney disease or end stage renal disease: Secondary | ICD-10-CM | POA: Diagnosis not present

## 2015-11-29 DIAGNOSIS — D631 Anemia in chronic kidney disease: Secondary | ICD-10-CM | POA: Diagnosis not present

## 2015-11-29 DIAGNOSIS — D573 Sickle-cell trait: Secondary | ICD-10-CM | POA: Insufficient documentation

## 2015-11-29 LAB — HEMOGLOBIN: Hemoglobin: 8.7 g/dL — ABNORMAL LOW (ref 12.0–16.0)

## 2015-11-29 MED ORDER — EPOETIN ALFA 10000 UNIT/ML IJ SOLN
10000.0000 [IU] | Freq: Once | INTRAMUSCULAR | Status: AC
  Administered 2015-11-29: 10000 [IU] via SUBCUTANEOUS

## 2015-12-01 DIAGNOSIS — M189 Osteoarthritis of first carpometacarpal joint, unspecified: Secondary | ICD-10-CM | POA: Diagnosis not present

## 2015-12-01 DIAGNOSIS — I10 Essential (primary) hypertension: Secondary | ICD-10-CM | POA: Diagnosis not present

## 2015-12-01 DIAGNOSIS — N186 End stage renal disease: Secondary | ICD-10-CM | POA: Diagnosis not present

## 2015-12-01 DIAGNOSIS — E785 Hyperlipidemia, unspecified: Secondary | ICD-10-CM | POA: Diagnosis not present

## 2015-12-01 DIAGNOSIS — E669 Obesity, unspecified: Secondary | ICD-10-CM | POA: Diagnosis not present

## 2015-12-01 DIAGNOSIS — Z01810 Encounter for preprocedural cardiovascular examination: Secondary | ICD-10-CM | POA: Diagnosis not present

## 2015-12-04 ENCOUNTER — Other Ambulatory Visit: Payer: Self-pay | Admitting: Vascular Surgery

## 2015-12-04 DIAGNOSIS — N186 End stage renal disease: Secondary | ICD-10-CM | POA: Diagnosis not present

## 2015-12-04 DIAGNOSIS — M189 Osteoarthritis of first carpometacarpal joint, unspecified: Secondary | ICD-10-CM | POA: Diagnosis not present

## 2015-12-04 DIAGNOSIS — E785 Hyperlipidemia, unspecified: Secondary | ICD-10-CM | POA: Diagnosis not present

## 2015-12-04 DIAGNOSIS — E669 Obesity, unspecified: Secondary | ICD-10-CM | POA: Diagnosis not present

## 2015-12-04 DIAGNOSIS — I10 Essential (primary) hypertension: Secondary | ICD-10-CM | POA: Diagnosis not present

## 2015-12-04 DIAGNOSIS — Z01810 Encounter for preprocedural cardiovascular examination: Secondary | ICD-10-CM | POA: Diagnosis not present

## 2015-12-07 ENCOUNTER — Encounter
Admission: RE | Admit: 2015-12-07 | Discharge: 2015-12-07 | Disposition: A | Discharge status: Home / Self Care | Payer: Commercial Managed Care - HMO | Source: Ambulatory Visit | Attending: Vascular Surgery | Admitting: Vascular Surgery

## 2015-12-07 DIAGNOSIS — Z01812 Encounter for preprocedural laboratory examination: Secondary | ICD-10-CM | POA: Insufficient documentation

## 2015-12-07 LAB — CBC WITH DIFFERENTIAL/PLATELET
BASOS PCT: 1 %
Basophils Absolute: 0 10*3/uL (ref 0–0.1)
EOS ABS: 0.2 10*3/uL (ref 0–0.7)
Eosinophils Relative: 2 %
HEMATOCRIT: 28.4 % — AB (ref 35.0–47.0)
HEMOGLOBIN: 9 g/dL — AB (ref 12.0–16.0)
LYMPHS ABS: 1.7 10*3/uL (ref 1.0–3.6)
Lymphocytes Relative: 18 %
MCH: 24.1 pg — AB (ref 26.0–34.0)
MCHC: 31.8 g/dL — ABNORMAL LOW (ref 32.0–36.0)
MCV: 75.7 fL — ABNORMAL LOW (ref 80.0–100.0)
Monocytes Absolute: 0.7 10*3/uL (ref 0.2–0.9)
Monocytes Relative: 7 %
NEUTROS ABS: 6.7 10*3/uL — AB (ref 1.4–6.5)
Neutrophils Relative %: 72 %
Platelets: 372 10*3/uL (ref 150–440)
RBC: 3.75 MIL/uL — AB (ref 3.80–5.20)
RDW: 16.5 % — ABNORMAL HIGH (ref 11.5–14.5)
WBC: 9.3 10*3/uL (ref 3.6–11.0)

## 2015-12-07 LAB — BASIC METABOLIC PANEL
ANION GAP: 9 (ref 5–15)
BUN: 58 mg/dL — ABNORMAL HIGH (ref 6–20)
CHLORIDE: 104 mmol/L (ref 101–111)
CO2: 24 mmol/L (ref 22–32)
Calcium: 9.4 mg/dL (ref 8.9–10.3)
Creatinine, Ser: 5.6 mg/dL — ABNORMAL HIGH (ref 0.44–1.00)
GFR calc Af Amer: 8 mL/min — ABNORMAL LOW (ref >60)
GFR, EST NON AFRICAN AMERICAN: 7 mL/min — AB (ref >60)
GLUCOSE: 152 mg/dL — AB (ref 65–99)
POTASSIUM: 3.3 mmol/L — AB (ref 3.5–5.1)
Sodium: 137 mmol/L (ref 135–145)

## 2015-12-07 LAB — TYPE AND SCREEN
ABO/RH(D): O POS
ANTIBODY SCREEN: NEGATIVE

## 2015-12-07 LAB — APTT: APTT: 32 s (ref 24–36)

## 2015-12-07 LAB — PROTIME-INR
INR: 1.18
PROTHROMBIN TIME: 15.2 s — AB (ref 11.4–15.0)

## 2015-12-07 NOTE — Patient Instructions (Signed)
  Your procedure is scheduled on: Friday 12/15/15 Report to Day Surgery. 2ND FLOOR MEDICAL MALL ENTRANCE To find out your arrival time please call [phone] between 1PM - 3PM on Thursday 12/14/15.  Remember: Instructions that are not followed completely may result in serious medical risk, up to and including death, or upon the discretion of your surgeon and anesthesiologist your surgery may need to be rescheduled.    __X__ 1. Do not eat food or drink liquids after midnight. No gum chewing or hard candies.     __X__ 2. No Alcohol for 24 hours before or after surgery.   ____ 3. Bring all medications with you on the day of surgery if instructed.    __X__ 4. Notify your doctor if there is any change in your medical condition     (cold, fever, infections).     Do not wear jewelry, make-up, hairpins, clips or nail polish.  Do not wear lotions, powders, or perfumes.   Do not shave 48 hours prior to surgery. Men may shave face and neck.  Do not bring valuables to the hospital.    [facility] is not responsible for any belongings or valuables.               Contacts, dentures or bridgework may not be worn into surgery.  Leave your suitcase in the car. After surgery it may be brought to your room.  For patients admitted to the hospital, discharge time is determined by your                treatment team.   Patients discharged the day of surgery will not be allowed to drive home.   Please read over the following fact sheets that you were given:   Surgical Site Infection Prevention   __X__ Take these medicines the morning of surgery with A SIP OF WATER:    1. NONE TAKE MEDS AS USUAL THE DAY BEFORE  2.   3.   4.  5.  6.  ____ Fleet Enema (as directed)   __X__ Use CHG Soap as directed  ____ Use inhalers on the day of surgery  ____ Stop metformin 2 days prior to surgery    ____ Take 1/2 of usual insulin dose the night before surgery and none on the morning of surgery.   ____ Stop  Coumadin/Plavix/aspirin on   ____ Stop Anti-inflammatories on    ____ Stop supplements until after surgery.    ____ Bring C-Pap to the hospital.

## 2015-12-08 ENCOUNTER — Encounter: Payer: Self-pay | Admitting: *Deleted

## 2015-12-08 NOTE — Pre-Procedure Instructions (Signed)
LABS CALLED AND FAXED TO DR [facility] OFFICE LM ON NURSES LINE. RECHECK KT FOR AM SURGERY

## 2015-12-13 NOTE — Pre-Procedure Instructions (Signed)
ANESTHESIA - CARDIAC CLEARANCE ON CHART

## 2015-12-14 NOTE — OR Nursing (Signed)
Mickel Baas at Dr Nino Parsley office notified about need of H&P.

## 2015-12-15 ENCOUNTER — Ambulatory Visit: Payer: Commercial Managed Care - HMO | Admitting: Anesthesiology

## 2015-12-15 ENCOUNTER — Ambulatory Visit
Admission: RE | Admit: 2015-12-15 | Discharge: 2015-12-15 | Disposition: A | Discharge status: Home / Self Care | Payer: Commercial Managed Care - HMO | Source: Ambulatory Visit | Attending: Vascular Surgery | Admitting: Vascular Surgery

## 2015-12-15 ENCOUNTER — Encounter: Payer: Self-pay | Admitting: *Deleted

## 2015-12-15 ENCOUNTER — Encounter
Admission: RE | Disposition: A | Discharge status: Home / Self Care | Payer: Self-pay | Source: Ambulatory Visit | Attending: Vascular Surgery

## 2015-12-15 DIAGNOSIS — E785 Hyperlipidemia, unspecified: Secondary | ICD-10-CM | POA: Insufficient documentation

## 2015-12-15 DIAGNOSIS — Z90710 Acquired absence of both cervix and uterus: Secondary | ICD-10-CM | POA: Insufficient documentation

## 2015-12-15 DIAGNOSIS — E7800 Pure hypercholesterolemia, unspecified: Secondary | ICD-10-CM | POA: Insufficient documentation

## 2015-12-15 DIAGNOSIS — Z79899 Other long term (current) drug therapy: Secondary | ICD-10-CM | POA: Insufficient documentation

## 2015-12-15 DIAGNOSIS — I10 Essential (primary) hypertension: Secondary | ICD-10-CM | POA: Diagnosis not present

## 2015-12-15 DIAGNOSIS — F17210 Nicotine dependence, cigarettes, uncomplicated: Secondary | ICD-10-CM | POA: Diagnosis not present

## 2015-12-15 DIAGNOSIS — Z841 Family history of disorders of kidney and ureter: Secondary | ICD-10-CM | POA: Diagnosis not present

## 2015-12-15 DIAGNOSIS — I129 Hypertensive chronic kidney disease with stage 1 through stage 4 chronic kidney disease, or unspecified chronic kidney disease: Secondary | ICD-10-CM | POA: Diagnosis not present

## 2015-12-15 DIAGNOSIS — I120 Hypertensive chronic kidney disease with stage 5 chronic kidney disease or end stage renal disease: Secondary | ICD-10-CM | POA: Insufficient documentation

## 2015-12-15 DIAGNOSIS — N186 End stage renal disease: Secondary | ICD-10-CM | POA: Insufficient documentation

## 2015-12-15 DIAGNOSIS — M189 Osteoarthritis of first carpometacarpal joint, unspecified: Secondary | ICD-10-CM | POA: Diagnosis not present

## 2015-12-15 DIAGNOSIS — E669 Obesity, unspecified: Secondary | ICD-10-CM | POA: Diagnosis not present

## 2015-12-15 DIAGNOSIS — D649 Anemia, unspecified: Secondary | ICD-10-CM | POA: Diagnosis not present

## 2015-12-15 DIAGNOSIS — Z01810 Encounter for preprocedural cardiovascular examination: Secondary | ICD-10-CM | POA: Diagnosis not present

## 2015-12-15 DIAGNOSIS — N189 Chronic kidney disease, unspecified: Secondary | ICD-10-CM | POA: Diagnosis not present

## 2015-12-15 HISTORY — PX: AV FISTULA PLACEMENT: SHX1204

## 2015-12-15 LAB — POCT I-STAT 4, (NA,K, GLUC, HGB,HCT)
Glucose, Bld: 91 mg/dL (ref 65–99)
HEMATOCRIT: 33 % — AB (ref 36.0–46.0)
HEMOGLOBIN: 11.2 g/dL — AB (ref 12.0–15.0)
POTASSIUM: 3.8 mmol/L (ref 3.5–5.1)
SODIUM: 139 mmol/L (ref 135–145)

## 2015-12-15 SURGERY — INSERTION OF ARTERIOVENOUS (AV) GORE-TEX GRAFT ARM
Anesthesia: General | Laterality: Left | Wound class: Clean

## 2015-12-15 MED ORDER — BUPIVACAINE HCL (PF) 0.5 % IJ SOLN
INTRAMUSCULAR | Status: DC | PRN
  Administered 2015-12-15: 8 mL

## 2015-12-15 MED ORDER — PHENYLEPHRINE HCL 10 MG/ML IJ SOLN
INTRAMUSCULAR | Status: DC | PRN
  Administered 2015-12-15 (×6): 100 ug via INTRAVENOUS
  Administered 2015-12-15: 200 ug via INTRAVENOUS

## 2015-12-15 MED ORDER — HEPARIN SODIUM (PORCINE) 5000 UNIT/ML IJ SOLN
INTRAMUSCULAR | Status: AC
  Filled 2015-12-15: qty 1

## 2015-12-15 MED ORDER — ONDANSETRON HCL 4 MG/2ML IJ SOLN
INTRAMUSCULAR | Status: DC | PRN
  Administered 2015-12-15: 4 mg via INTRAVENOUS

## 2015-12-15 MED ORDER — EPHEDRINE SULFATE 50 MG/ML IJ SOLN
INTRAMUSCULAR | Status: DC | PRN
  Administered 2015-12-15 (×2): 10 mg via INTRAVENOUS

## 2015-12-15 MED ORDER — FENTANYL CITRATE (PF) 100 MCG/2ML IJ SOLN: 25.0000 ug | INTRAMUSCULAR | Status: DC | PRN

## 2015-12-15 MED ORDER — LIDOCAINE HCL (CARDIAC) 20 MG/ML IV SOLN
INTRAVENOUS | Status: DC | PRN
  Administered 2015-12-15: 100 mg via INTRAVENOUS

## 2015-12-15 MED ORDER — FAMOTIDINE 20 MG PO TABS
20.0000 mg | ORAL_TABLET | Freq: Once | ORAL | Status: AC
Start: 2015-12-15 — End: 2015-12-15
  Administered 2015-12-15: 20 mg via ORAL

## 2015-12-15 MED ORDER — FAMOTIDINE 20 MG PO TABS
ORAL_TABLET | ORAL | Status: AC
  Administered 2015-12-15: 20 mg via ORAL
  Filled 2015-12-15: qty 1

## 2015-12-15 MED ORDER — OXYCODONE HCL 5 MG PO TABS: 5.0000 mg | ORAL_TABLET | Freq: Once | ORAL | Status: DC | PRN

## 2015-12-15 MED ORDER — PAPAVERINE HCL 30 MG/ML IJ SOLN
INTRAMUSCULAR | Status: AC
  Filled 2015-12-15: qty 2

## 2015-12-15 MED ORDER — DEXTROSE 5 % IV SOLN
2.0000 g | INTRAVENOUS | Status: DC
  Filled 2015-12-15: qty 20

## 2015-12-15 MED ORDER — HYDROCODONE-ACETAMINOPHEN 5-325 MG PO TABS: 1.0000 | ORAL_TABLET | Freq: Four times a day (QID) | ORAL | Status: DC | PRN

## 2015-12-15 MED ORDER — PROPOFOL 10 MG/ML IV BOLUS
INTRAVENOUS | Status: DC | PRN
  Administered 2015-12-15: 150 mg via INTRAVENOUS

## 2015-12-15 MED ORDER — BUPIVACAINE HCL (PF) 0.5 % IJ SOLN
INTRAMUSCULAR | Status: AC
  Filled 2015-12-15: qty 30

## 2015-12-15 MED ORDER — SODIUM CHLORIDE 0.9 % IV SOLN
INTRAVENOUS | Status: DC
  Administered 2015-12-15: 09:00:00 via INTRAVENOUS

## 2015-12-15 MED ORDER — SODIUM CHLORIDE 0.9 % IV SOLN
INTRAVENOUS | Status: DC | PRN
  Administered 2015-12-15: 20 mL via INTRAMUSCULAR

## 2015-12-15 MED ORDER — CEFAZOLIN SODIUM-DEXTROSE 2-4 GM/100ML-% IV SOLN
INTRAVENOUS | Status: AC
  Administered 2015-12-15: 2 g via INTRAVENOUS
  Filled 2015-12-15: qty 100

## 2015-12-15 MED ORDER — FENTANYL CITRATE (PF) 100 MCG/2ML IJ SOLN
INTRAMUSCULAR | Status: DC | PRN
  Administered 2015-12-15 (×5): 25 ug via INTRAVENOUS

## 2015-12-15 MED ORDER — CEFAZOLIN SODIUM-DEXTROSE 2-4 GM/100ML-% IV SOLN
2.0000 g | INTRAVENOUS | Status: AC
  Administered 2015-12-15: 2 g via INTRAVENOUS

## 2015-12-15 MED ORDER — OXYCODONE HCL 5 MG/5ML PO SOLN: 5.0000 mg | Freq: Once | ORAL | Status: DC | PRN

## 2015-12-15 MED ORDER — PHENYLEPHRINE HCL 10 MG/ML IJ SOLN
10000.0000 ug | INTRAMUSCULAR | Status: DC | PRN
  Administered 2015-12-15: 40 ug/min via INTRAVENOUS

## 2015-12-15 SURGICAL SUPPLY — 57 items
APPLIER CLIP 11 MED OPEN (CLIP)
APPLIER CLIP 9.375 SM OPEN (CLIP)
BAG COUNTER SPONGE EZ (MISCELLANEOUS) IMPLANT
BAG DECANTER FOR FLEXI CONT (MISCELLANEOUS) ×3 IMPLANT
BLADE SURG SZ11 CARB STEEL (BLADE) ×3 IMPLANT
BOOT SUTURE AID YELLOW STND (SUTURE) ×3 IMPLANT
BRUSH SCRUB 4% CHG (MISCELLANEOUS) ×3 IMPLANT
CANISTER SUCT 1200ML W/VALVE (MISCELLANEOUS) ×3 IMPLANT
CHLORAPREP W/TINT 26ML (MISCELLANEOUS) ×3 IMPLANT
CLIP APPLIE 11 MED OPEN (CLIP) IMPLANT
CLIP APPLIE 9.375 SM OPEN (CLIP) IMPLANT
COUNTER SPONGE BAG EZ (MISCELLANEOUS)
DRESSING SURGICEL FIBRLLR 1X2 (HEMOSTASIS) ×1 IMPLANT
DRSG SURGICEL FIBRILLAR 1X2 (HEMOSTASIS) ×3
ELECT CAUTERY BLADE 6.4 (BLADE) ×3 IMPLANT
ELECT REM PT RETURN 9FT ADLT (ELECTROSURGICAL) ×3
ELECTRODE REM PT RTRN 9FT ADLT (ELECTROSURGICAL) ×1 IMPLANT
GLOVE BIO SURGEON STRL SZ7 (GLOVE) ×6 IMPLANT
GLOVE INDICATOR 7.5 STRL GRN (GLOVE) ×6 IMPLANT
GLOVE SURG SYN 8.0 (GLOVE) ×3 IMPLANT
GOWN STRL REUS W/ TWL LRG LVL3 (GOWN DISPOSABLE) ×2 IMPLANT
GOWN STRL REUS W/ TWL XL LVL3 (GOWN DISPOSABLE) ×1 IMPLANT
GOWN STRL REUS W/TWL LRG LVL3 (GOWN DISPOSABLE) ×4
GOWN STRL REUS W/TWL XL LVL3 (GOWN DISPOSABLE) ×2
GRAFT PROPATEN STD WALL 4 7X45 (Vascular Products) ×3 IMPLANT
IV NS 500ML (IV SOLUTION) ×2
IV NS 500ML BAXH (IV SOLUTION) ×1 IMPLANT
KIT RM TURNOVER STRD PROC AR (KITS) ×3 IMPLANT
LABEL OR SOLS (LABEL) ×3 IMPLANT
LIQUID BAND (GAUZE/BANDAGES/DRESSINGS) ×3 IMPLANT
LOOP RED MAXI  1X406MM (MISCELLANEOUS) ×2
LOOP VESSEL MAXI 1X406 RED (MISCELLANEOUS) ×1 IMPLANT
LOOP VESSEL MINI 0.8X406 BLUE (MISCELLANEOUS) ×2 IMPLANT
LOOPS BLUE MINI 0.8X406MM (MISCELLANEOUS) ×4
NEEDLE FILTER BLUNT 18X 1/2SAF (NEEDLE) ×2
NEEDLE FILTER BLUNT 18X1 1/2 (NEEDLE) ×1 IMPLANT
NS IRRIG 500ML POUR BTL (IV SOLUTION) ×3 IMPLANT
PACK EXTREMITY ARMC (MISCELLANEOUS) ×3 IMPLANT
PAD PREP 24X41 OB/GYN DISP (PERSONAL CARE ITEMS) ×3 IMPLANT
PUNCH SURGICAL ROTATE 2.7MM (MISCELLANEOUS) IMPLANT
STOCKINETTE STRL 4IN 9604848 (GAUZE/BANDAGES/DRESSINGS) ×3 IMPLANT
SUT GTX CV-6 30 (SUTURE) ×6 IMPLANT
SUT MNCRL+ 5-0 UNDYED PC-3 (SUTURE) ×1 IMPLANT
SUT MONOCRYL 5-0 (SUTURE) ×2
SUT PROLENE 6 0 BV (SUTURE) ×9 IMPLANT
SUT SILK 2 0 (SUTURE) ×2
SUT SILK 2 0 SH (SUTURE) ×3 IMPLANT
SUT SILK 2-0 18XBRD TIE 12 (SUTURE) ×1 IMPLANT
SUT SILK 3 0 (SUTURE) ×2
SUT SILK 3-0 18XBRD TIE 12 (SUTURE) ×1 IMPLANT
SUT SILK 4 0 (SUTURE) ×2
SUT SILK 4-0 18XBRD TIE 12 (SUTURE) ×1 IMPLANT
SUT VIC AB 3-0 SH 27 (SUTURE) ×2
SUT VIC AB 3-0 SH 27X BRD (SUTURE) ×1 IMPLANT
SYR 20CC LL (SYRINGE) ×3 IMPLANT
SYR 3ML LL SCALE MARK (SYRINGE) ×3 IMPLANT
SYRINGE 10CC LL (SYRINGE) ×3 IMPLANT

## 2015-12-15 NOTE — Op Note (Signed)
OPERATIVE NOTE   PROCEDURE: left brachial axillary arteriovenous graft placement  PRE-OPERATIVE DIAGNOSIS: End Stage Renal Disease  POST-OPERATIVE DIAGNOSIS: End Stage Renal Disease  SURGEON: Avacyn Kloosterman, Dolores Lory  ASSISTANT(S): Ms. Hezzie Bump  ANESTHESIA: general  ESTIMATED BLOOD LOSS: <50 cc  FINDING(S): 8-10 mm axillary vein  SPECIMEN(S):  none  INDICATIONS:   Kathleen Sharp is a 70 y.o. female who presents with end stage renal disease.  The patient is scheduled for left brachial axillary AV graft placement.  The patient is aware the risks include but are not limited to: bleeding, infection, steal syndrome, nerve damage, ischemic monomelic neuropathy, failure to mature, and need for additional procedures.  The patient is aware of the risks of the procedure and elects to proceed forward.  DESCRIPTION: After full informed written consent was obtained from the patient, the patient was brought back to the operating room and placed supine upon the operating table.  Prior to induction, the patient received IV antibiotics.   After obtaining adequate anesthesia, the patient was then prepped and draped in the standard fashion for a left arm access procedure.    A linear incision was then created along the medial border of the biceps muscle just proximal to the antecubital crease and the brachial artery which was exposed through. The brachial artery was then looped proximally and distally with Silastic Vesseloops. Side branches were controlled with 4-0 silk ties.  Attention was then turned to the exposure of the axillary vein. Linear incision was then created medial to the proximal portion of the biceps at the level of the anterior axillary crease. The axillary vein was exposed and again looped proximally and distally with Silastic vessel loops. Associated tributaries were also controlled with Silastic Vesseloops.  The Gore tunneler was then delivered onto the field and a  subcutaneous path was made from the arterial incision to the venous incision. A 4-7 tapered PTFE propatent graft by Simeon Craft was then pulled through the subcutaneous tunnel. The arterial 4 mm portion was then approximated to the brachial artery. Brachial artery was controlled proximally and distally with the Silastic Vesseloops. Arteriotomy was made with an 11 blade scalpel and extended with Potts scissors and a 6-0 Prolene stay suture was placed. End graft to side brachial artery anastomosis was then fashioned with running CV 6 suture. Flushing maneuvers were performed suture line was hemostatic and the graft was then assessed for proper position and ease of future cannulation. Heparinized saline was infused into the vein and the graft was clamped with a vascular clamp. With the graft pressurized it was approximated to the axillary vein in its native bed and then marked with a surgical marker. The vein was then delivered into the surgical field and controlled with the Silastic vessel loops. Venotomy was then made with an 11 blade scalpel and extended with Potts scissors and a 6-0 Prolene suture was used as stay suture. The the graft was then sewn to the vein in an end graft to side vein fashion using running CV 6 suture.  Flushing maneuvers were performed and the artery was allowed to forward and back bleed.  Flow was then established through the AV graft  There was good  thrill in the venous outflow, and there was 1+ palpable radial pulse.  At this point, I irrigated out the surgical wounds.  There was no further active bleeding.  The subcutaneous tissue was reapproximated with a running stitch of 3-0 Vicryl.  The skin was then reapproximated with  a running subcuticular stitch of 4-0 Vicryl.  The skin was then cleaned, dried, and reinforced with Dermabond.    The patient tolerated this procedure well.   COMPLICATIONS: None  CONDITION: [name]  Office:  [phone]   12/15/2015, 10:47 AM

## 2015-12-15 NOTE — H&P (Signed)
[facility] History & Physical Update  The patient was interviewed and re-examined.  The patient's previous History and Physical has been reviewed and is unchanged.  There is no change in the plan of care. We plan to proceed with the scheduled procedure.  Schnier, Dolores Lory, MD  12/15/2015, 8:57 AM

## 2015-12-15 NOTE — Progress Notes (Signed)
Bruit and thrill noted with good radial pulse

## 2015-12-15 NOTE — Anesthesia Preprocedure Evaluation (Signed)
Anesthesia Evaluation  Patient identified by MRN, date of birth, ID band Patient awake    Reviewed: Allergy & Precautions, H&P , NPO status , Patient's Chart, lab work & pertinent test results  History of Anesthesia Complications Negative for: history of anesthetic complications  Airway Mallampati: III  TM Distance: >3 FB Neck ROM: limited    Dental  (+) Poor Dentition, Chipped, Missing, Edentulous Upper   Pulmonary neg shortness of breath, former smoker,    Pulmonary exam normal breath sounds clear to auscultation       Cardiovascular Exercise Tolerance: Good hypertension, (-) angina(-) Past MI and (-) DOE Normal cardiovascular exam Rhythm:regular Rate:Normal     Neuro/Psych negative neurological ROS  negative psych ROS   GI/Hepatic negative GI ROS, Neg liver ROS,   Endo/Other  negative endocrine ROS  Renal/GU CRF and ESRFRenal disease  negative genitourinary   Musculoskeletal  (+) Arthritis ,   Abdominal   Peds  Hematology negative hematology ROS (+)   Anesthesia Other Findings Past Medical History:   Anemia                                                       Hypertension                                                 Arthritis                                                    Hypercholesterolemia                                         Chronic kidney disease                                         Comment:Chronic Kidney Disease  Past Surgical History:   ABDOMINAL HYSTERECTOMY                                        CAPD INSERTION                                  N/A 11/24/2015      Comment:Procedure: Exploratory laparoscopy;  Surgeon:               Renford Dills, MD;  Location: [facility] ORS;                Service: Vascular;  Laterality: N/A;, attempted              insertion but unable due to scar tissue  BMI    Body Mass Index   32.18 kg/m 2      Reproductive/Obstetrics negative OB ROS  Anesthesia Physical Anesthesia Plan  ASA: III  Anesthesia Plan: General LMA   Post-op Pain Management:    Induction:   Airway Management Planned:   Additional Equipment:   Intra-op Plan:   Post-operative Plan:   Informed Consent: I have reviewed the patients History and Physical, chart, labs and discussed the procedure including the risks, benefits and alternatives for the proposed anesthesia with the patient or authorized representative who has indicated his/her understanding and acceptance.   Dental Advisory Given  Plan Discussed with: Anesthesiologist, CRNA and Surgeon  Anesthesia Plan Comments:         Anesthesia Quick Evaluation

## 2015-12-15 NOTE — Anesthesia Procedure Notes (Signed)
Procedure Name: LMA Insertion Performed by: Rolla Plate Pre-anesthesia Checklist: Patient identified, Patient being monitored, Timeout performed, Emergency Drugs available and Suction available Patient Re-evaluated:Patient Re-evaluated prior to inductionOxygen Delivery Method: Circle system utilized Preoxygenation: Pre-oxygenation with 100% oxygen Intubation Type: IV induction Ventilation: Mask ventilation without difficulty LMA: LMA inserted LMA Size: 3.0 Tube type: Oral Number of attempts: 1 Placement Confirmation: positive ETCO2 and breath sounds checked- equal and bilateral Tube secured with: Tape Dental Injury: Teeth and Oropharynx as per pre-operative assessment

## 2015-12-15 NOTE — Transfer of Care (Signed)
Immediate Anesthesia Transfer of Care Note  Patient: Kathleen Sharp  Procedure(s) Performed: Procedure(s): INSERTION OF ARTERIOVENOUS (AV) GORE-TEX GRAFT ARM             ( BRACHIAL AXILLARY GRAFT ) (Left)  Patient Location: PACU  Anesthesia Type:General  Level of Consciousness: awake, alert  and oriented  Airway & Oxygen Therapy: Patient Spontanous Breathing and Patient connected to face mask oxygen  Post-op Assessment: Report given to RN and Post -op Vital signs reviewed and stable  Post vital signs: Reviewed and stable  Last Vitals:  Filed Vitals:   12/15/15 0810 12/15/15 1114  BP: 132/75 113/74  Pulse: 76 97  Temp: 36.9 C   Resp: 16 21    Complications: No apparent anesthesia complications

## 2015-12-15 NOTE — Progress Notes (Signed)
Good bruit and thrill  Radial pulse comes and goes  But fingers warm   Dr schnier aware

## 2015-12-15 NOTE — Anesthesia Postprocedure Evaluation (Signed)
Anesthesia Post Note  Patient: VERLENA MARLETTE  Procedure(s) Performed: Procedure(s) (LRB): INSERTION OF ARTERIOVENOUS (AV) GORE-TEX GRAFT ARM             ( BRACHIAL AXILLARY GRAFT ) (Left)  Patient location during evaluation: PACU Anesthesia Type: General Level of consciousness: awake and alert Pain management: pain level controlled Vital Signs Assessment: post-procedure vital signs reviewed and stable Respiratory status: spontaneous breathing, nonlabored ventilation, respiratory function stable and patient connected to nasal cannula oxygen Cardiovascular status: blood pressure returned to baseline and stable Postop Assessment: no signs of nausea or vomiting Anesthetic complications: no    Last Vitals:  Filed Vitals:   12/15/15 1218 12/15/15 1242  BP: 116/67 109/69  Pulse: 82 81  Temp:    Resp: 16 16    Last Pain:  Filed Vitals:   12/15/15 1244  PainSc: 3                  Broadus John K Piscitello

## 2015-12-20 ENCOUNTER — Inpatient Hospital Stay: Payer: Commercial Managed Care - HMO

## 2015-12-20 ENCOUNTER — Inpatient Hospital Stay: Payer: Commercial Managed Care - HMO | Attending: Hematology and Oncology

## 2015-12-20 VITALS — BP 105/65 | HR 71 | Temp 98.1°F | Resp 17

## 2015-12-20 DIAGNOSIS — D573 Sickle-cell trait: Secondary | ICD-10-CM | POA: Insufficient documentation

## 2015-12-20 DIAGNOSIS — I120 Hypertensive chronic kidney disease with stage 5 chronic kidney disease or end stage renal disease: Secondary | ICD-10-CM | POA: Diagnosis not present

## 2015-12-20 DIAGNOSIS — Z79899 Other long term (current) drug therapy: Secondary | ICD-10-CM | POA: Diagnosis not present

## 2015-12-20 DIAGNOSIS — D631 Anemia in chronic kidney disease: Secondary | ICD-10-CM | POA: Diagnosis not present

## 2015-12-20 DIAGNOSIS — N185 Chronic kidney disease, stage 5: Principal | ICD-10-CM

## 2015-12-20 LAB — HEMOGLOBIN: Hemoglobin: 8.7 g/dL — ABNORMAL LOW (ref 12.0–16.0)

## 2015-12-20 MED ORDER — EPOETIN ALFA 10000 UNIT/ML IJ SOLN
10000.0000 [IU] | Freq: Once | INTRAMUSCULAR | Status: AC
  Administered 2015-12-20: 10000 [IU] via SUBCUTANEOUS

## 2016-01-10 ENCOUNTER — Inpatient Hospital Stay: Payer: Commercial Managed Care - HMO

## 2016-01-10 VITALS — BP 105/72 | HR 76 | Resp 20

## 2016-01-10 DIAGNOSIS — D631 Anemia in chronic kidney disease: Secondary | ICD-10-CM | POA: Diagnosis not present

## 2016-01-10 DIAGNOSIS — D573 Sickle-cell trait: Secondary | ICD-10-CM | POA: Diagnosis not present

## 2016-01-10 DIAGNOSIS — Z79899 Other long term (current) drug therapy: Secondary | ICD-10-CM | POA: Diagnosis not present

## 2016-01-10 DIAGNOSIS — N185 Chronic kidney disease, stage 5: Principal | ICD-10-CM

## 2016-01-10 DIAGNOSIS — I120 Hypertensive chronic kidney disease with stage 5 chronic kidney disease or end stage renal disease: Secondary | ICD-10-CM | POA: Diagnosis not present

## 2016-01-10 LAB — HEMOGLOBIN: Hemoglobin: 8.9 g/dL — ABNORMAL LOW (ref 12.0–16.0)

## 2016-01-10 MED ORDER — EPOETIN ALFA 10000 UNIT/ML IJ SOLN
10000.0000 [IU] | Freq: Once | INTRAMUSCULAR | Status: AC
  Administered 2016-01-10: 10000 [IU] via SUBCUTANEOUS

## 2016-01-19 DIAGNOSIS — E872 Acidosis: Secondary | ICD-10-CM | POA: Diagnosis not present

## 2016-01-19 DIAGNOSIS — D631 Anemia in chronic kidney disease: Secondary | ICD-10-CM | POA: Diagnosis not present

## 2016-01-19 DIAGNOSIS — N185 Chronic kidney disease, stage 5: Secondary | ICD-10-CM | POA: Diagnosis not present

## 2016-01-19 DIAGNOSIS — N2581 Secondary hyperparathyroidism of renal origin: Secondary | ICD-10-CM | POA: Diagnosis not present

## 2016-01-19 DIAGNOSIS — I10 Essential (primary) hypertension: Secondary | ICD-10-CM | POA: Diagnosis not present

## 2016-01-22 DIAGNOSIS — D631 Anemia in chronic kidney disease: Secondary | ICD-10-CM | POA: Diagnosis not present

## 2016-01-22 DIAGNOSIS — E872 Acidosis: Secondary | ICD-10-CM | POA: Diagnosis not present

## 2016-01-22 DIAGNOSIS — N185 Chronic kidney disease, stage 5: Secondary | ICD-10-CM | POA: Diagnosis not present

## 2016-01-22 DIAGNOSIS — N2581 Secondary hyperparathyroidism of renal origin: Secondary | ICD-10-CM | POA: Diagnosis not present

## 2016-01-29 DIAGNOSIS — N186 End stage renal disease: Secondary | ICD-10-CM | POA: Diagnosis not present

## 2016-01-29 DIAGNOSIS — Z23 Encounter for immunization: Secondary | ICD-10-CM | POA: Diagnosis not present

## 2016-01-29 DIAGNOSIS — Z992 Dependence on renal dialysis: Secondary | ICD-10-CM | POA: Diagnosis not present

## 2016-01-30 ENCOUNTER — Other Ambulatory Visit: Payer: Self-pay

## 2016-01-30 DIAGNOSIS — D631 Anemia in chronic kidney disease: Secondary | ICD-10-CM

## 2016-01-30 DIAGNOSIS — N185 Chronic kidney disease, stage 5: Principal | ICD-10-CM

## 2016-01-31 ENCOUNTER — Inpatient Hospital Stay: Payer: Commercial Managed Care - HMO | Admitting: Hematology and Oncology

## 2016-01-31 ENCOUNTER — Inpatient Hospital Stay: Payer: Commercial Managed Care - HMO | Attending: Hematology and Oncology

## 2016-01-31 ENCOUNTER — Inpatient Hospital Stay: Payer: Commercial Managed Care - HMO

## 2016-01-31 ENCOUNTER — Other Ambulatory Visit: Payer: Self-pay | Admitting: Hematology and Oncology

## 2016-01-31 DIAGNOSIS — N186 End stage renal disease: Secondary | ICD-10-CM | POA: Diagnosis not present

## 2016-01-31 DIAGNOSIS — Z23 Encounter for immunization: Secondary | ICD-10-CM | POA: Diagnosis not present

## 2016-01-31 DIAGNOSIS — Z992 Dependence on renal dialysis: Secondary | ICD-10-CM | POA: Diagnosis not present

## 2016-02-02 DIAGNOSIS — N186 End stage renal disease: Secondary | ICD-10-CM | POA: Diagnosis not present

## 2016-02-02 DIAGNOSIS — N2581 Secondary hyperparathyroidism of renal origin: Secondary | ICD-10-CM | POA: Diagnosis not present

## 2016-02-02 DIAGNOSIS — Z992 Dependence on renal dialysis: Secondary | ICD-10-CM | POA: Diagnosis not present

## 2016-02-02 DIAGNOSIS — Z1159 Encounter for screening for other viral diseases: Secondary | ICD-10-CM | POA: Diagnosis not present

## 2016-02-02 DIAGNOSIS — Z23 Encounter for immunization: Secondary | ICD-10-CM | POA: Diagnosis not present

## 2016-02-05 DIAGNOSIS — N186 End stage renal disease: Secondary | ICD-10-CM | POA: Diagnosis not present

## 2016-02-05 DIAGNOSIS — Z23 Encounter for immunization: Secondary | ICD-10-CM | POA: Diagnosis not present

## 2016-02-05 DIAGNOSIS — Z992 Dependence on renal dialysis: Secondary | ICD-10-CM | POA: Diagnosis not present

## 2016-02-06 ENCOUNTER — Other Ambulatory Visit: Payer: Self-pay

## 2016-02-06 DIAGNOSIS — D631 Anemia in chronic kidney disease: Secondary | ICD-10-CM

## 2016-02-06 DIAGNOSIS — N185 Chronic kidney disease, stage 5: Principal | ICD-10-CM

## 2016-02-07 ENCOUNTER — Inpatient Hospital Stay: Payer: Commercial Managed Care - HMO

## 2016-02-07 ENCOUNTER — Inpatient Hospital Stay: Payer: Commercial Managed Care - HMO | Admitting: Hematology and Oncology

## 2016-02-07 DIAGNOSIS — N186 End stage renal disease: Secondary | ICD-10-CM | POA: Diagnosis not present

## 2016-02-07 DIAGNOSIS — Z992 Dependence on renal dialysis: Secondary | ICD-10-CM | POA: Diagnosis not present

## 2016-02-07 DIAGNOSIS — Z23 Encounter for immunization: Secondary | ICD-10-CM | POA: Diagnosis not present

## 2016-02-09 DIAGNOSIS — Z23 Encounter for immunization: Secondary | ICD-10-CM | POA: Diagnosis not present

## 2016-02-09 DIAGNOSIS — N186 End stage renal disease: Secondary | ICD-10-CM | POA: Diagnosis not present

## 2016-02-09 DIAGNOSIS — Z992 Dependence on renal dialysis: Secondary | ICD-10-CM | POA: Diagnosis not present

## 2016-02-12 DIAGNOSIS — N186 End stage renal disease: Secondary | ICD-10-CM | POA: Diagnosis not present

## 2016-02-12 DIAGNOSIS — Z992 Dependence on renal dialysis: Secondary | ICD-10-CM | POA: Diagnosis not present

## 2016-02-12 DIAGNOSIS — Z23 Encounter for immunization: Secondary | ICD-10-CM | POA: Diagnosis not present

## 2016-02-14 DIAGNOSIS — N186 End stage renal disease: Secondary | ICD-10-CM | POA: Diagnosis not present

## 2016-02-14 DIAGNOSIS — Z992 Dependence on renal dialysis: Secondary | ICD-10-CM | POA: Diagnosis not present

## 2016-02-14 DIAGNOSIS — Z23 Encounter for immunization: Secondary | ICD-10-CM | POA: Diagnosis not present

## 2016-02-15 ENCOUNTER — Encounter: Payer: Self-pay | Admitting: Vascular Surgery

## 2016-02-16 DIAGNOSIS — Z992 Dependence on renal dialysis: Secondary | ICD-10-CM | POA: Diagnosis not present

## 2016-02-16 DIAGNOSIS — N186 End stage renal disease: Secondary | ICD-10-CM | POA: Diagnosis not present

## 2016-02-16 DIAGNOSIS — Z23 Encounter for immunization: Secondary | ICD-10-CM | POA: Diagnosis not present

## 2016-02-19 DIAGNOSIS — N186 End stage renal disease: Secondary | ICD-10-CM | POA: Diagnosis not present

## 2016-02-19 DIAGNOSIS — Z992 Dependence on renal dialysis: Secondary | ICD-10-CM | POA: Diagnosis not present

## 2016-02-19 DIAGNOSIS — Z23 Encounter for immunization: Secondary | ICD-10-CM | POA: Diagnosis not present

## 2016-02-21 ENCOUNTER — Inpatient Hospital Stay: Payer: Commercial Managed Care - HMO | Attending: Hematology and Oncology

## 2016-02-21 ENCOUNTER — Inpatient Hospital Stay: Payer: Commercial Managed Care - HMO

## 2016-02-21 DIAGNOSIS — N186 End stage renal disease: Secondary | ICD-10-CM | POA: Diagnosis not present

## 2016-02-21 DIAGNOSIS — Z23 Encounter for immunization: Secondary | ICD-10-CM | POA: Diagnosis not present

## 2016-02-21 DIAGNOSIS — Z992 Dependence on renal dialysis: Secondary | ICD-10-CM | POA: Diagnosis not present

## 2016-02-23 DIAGNOSIS — Z992 Dependence on renal dialysis: Secondary | ICD-10-CM | POA: Diagnosis not present

## 2016-02-23 DIAGNOSIS — N186 End stage renal disease: Secondary | ICD-10-CM | POA: Diagnosis not present

## 2016-02-23 DIAGNOSIS — Z23 Encounter for immunization: Secondary | ICD-10-CM | POA: Diagnosis not present

## 2016-02-26 ENCOUNTER — Other Ambulatory Visit: Payer: Self-pay | Admitting: Vascular Surgery

## 2016-02-27 ENCOUNTER — Encounter
Admission: RE | Disposition: A | Discharge status: Home / Self Care | Payer: Self-pay | Source: Ambulatory Visit | Attending: Vascular Surgery

## 2016-02-27 ENCOUNTER — Ambulatory Visit
Admission: RE | Admit: 2016-02-27 | Discharge: 2016-02-27 | Disposition: A | Discharge status: Home / Self Care | Payer: Commercial Managed Care - HMO | Source: Ambulatory Visit | Attending: Vascular Surgery | Admitting: Vascular Surgery

## 2016-02-27 DIAGNOSIS — Z992 Dependence on renal dialysis: Secondary | ICD-10-CM | POA: Insufficient documentation

## 2016-02-27 DIAGNOSIS — T82868A Thrombosis of vascular prosthetic devices, implants and grafts, initial encounter: Secondary | ICD-10-CM | POA: Diagnosis not present

## 2016-02-27 DIAGNOSIS — M1990 Unspecified osteoarthritis, unspecified site: Secondary | ICD-10-CM | POA: Insufficient documentation

## 2016-02-27 DIAGNOSIS — I119 Hypertensive heart disease without heart failure: Secondary | ICD-10-CM | POA: Diagnosis not present

## 2016-02-27 DIAGNOSIS — Z01810 Encounter for preprocedural cardiovascular examination: Secondary | ICD-10-CM | POA: Diagnosis not present

## 2016-02-27 DIAGNOSIS — D649 Anemia, unspecified: Secondary | ICD-10-CM | POA: Insufficient documentation

## 2016-02-27 DIAGNOSIS — I120 Hypertensive chronic kidney disease with stage 5 chronic kidney disease or end stage renal disease: Secondary | ICD-10-CM | POA: Diagnosis not present

## 2016-02-27 DIAGNOSIS — Y832 Surgical operation with anastomosis, bypass or graft as the cause of abnormal reaction of the patient, or of later complication, without mention of misadventure at the time of the procedure: Secondary | ICD-10-CM | POA: Diagnosis not present

## 2016-02-27 DIAGNOSIS — N185 Chronic kidney disease, stage 5: Secondary | ICD-10-CM | POA: Diagnosis not present

## 2016-02-27 DIAGNOSIS — Z841 Family history of disorders of kidney and ureter: Secondary | ICD-10-CM | POA: Diagnosis not present

## 2016-02-27 DIAGNOSIS — E785 Hyperlipidemia, unspecified: Secondary | ICD-10-CM | POA: Diagnosis not present

## 2016-02-27 DIAGNOSIS — E7800 Pure hypercholesterolemia, unspecified: Secondary | ICD-10-CM | POA: Diagnosis not present

## 2016-02-27 DIAGNOSIS — I129 Hypertensive chronic kidney disease with stage 1 through stage 4 chronic kidney disease, or unspecified chronic kidney disease: Secondary | ICD-10-CM | POA: Diagnosis not present

## 2016-02-27 DIAGNOSIS — E669 Obesity, unspecified: Secondary | ICD-10-CM | POA: Diagnosis not present

## 2016-02-27 DIAGNOSIS — Z90710 Acquired absence of both cervix and uterus: Secondary | ICD-10-CM | POA: Insufficient documentation

## 2016-02-27 DIAGNOSIS — M189 Osteoarthritis of first carpometacarpal joint, unspecified: Secondary | ICD-10-CM | POA: Diagnosis not present

## 2016-02-27 DIAGNOSIS — N186 End stage renal disease: Secondary | ICD-10-CM | POA: Insufficient documentation

## 2016-02-27 DIAGNOSIS — Z8249 Family history of ischemic heart disease and other diseases of the circulatory system: Secondary | ICD-10-CM | POA: Diagnosis not present

## 2016-02-27 DIAGNOSIS — I10 Essential (primary) hypertension: Secondary | ICD-10-CM | POA: Diagnosis not present

## 2016-02-27 DIAGNOSIS — Z87891 Personal history of nicotine dependence: Secondary | ICD-10-CM | POA: Insufficient documentation

## 2016-02-27 HISTORY — PX: PERIPHERAL VASCULAR CATHETERIZATION: SHX172C

## 2016-02-27 LAB — POTASSIUM (ARMC VASCULAR LAB ONLY): POTASSIUM (ARMC VASCULAR LAB): 4.6 (ref 3.5–5.1)

## 2016-02-27 SURGERY — THROMBECTOMY
Anesthesia: Moderate Sedation | Laterality: Left

## 2016-02-27 MED ORDER — MIDAZOLAM HCL 5 MG/5ML IJ SOLN
INTRAMUSCULAR | Status: AC
  Filled 2016-02-27: qty 5

## 2016-02-27 MED ORDER — HEPARIN (PORCINE) IN NACL 2-0.9 UNIT/ML-% IJ SOLN
INTRAMUSCULAR | Status: AC
  Filled 2016-02-27: qty 1000

## 2016-02-27 MED ORDER — FENTANYL CITRATE (PF) 100 MCG/2ML IJ SOLN
INTRAMUSCULAR | Status: AC
  Filled 2016-02-27: qty 2

## 2016-02-27 MED ORDER — HYDROMORPHONE HCL 1 MG/ML IJ SOLN: 1.0000 mg | Freq: Once | INTRAMUSCULAR | Status: DC

## 2016-02-27 MED ORDER — METHYLPREDNISOLONE SODIUM SUCC 125 MG IJ SOLR
125.0000 mg | INTRAMUSCULAR | Status: DC | PRN
Start: 2016-02-27 — End: 2016-02-27

## 2016-02-27 MED ORDER — IOPAMIDOL (ISOVUE-300) INJECTION 61%
INTRAVENOUS | Status: DC | PRN
  Administered 2016-02-27: 50 mL via INTRA_ARTERIAL

## 2016-02-27 MED ORDER — HEPARIN SODIUM (PORCINE) 1000 UNIT/ML IJ SOLN
INTRAMUSCULAR | Status: AC
  Filled 2016-02-27: qty 1

## 2016-02-27 MED ORDER — ONDANSETRON HCL 4 MG/2ML IJ SOLN: 4.0000 mg | Freq: Four times a day (QID) | INTRAMUSCULAR | Status: DC | PRN

## 2016-02-27 MED ORDER — LIDOCAINE-EPINEPHRINE (PF) 1 %-1:200000 IJ SOLN
INTRAMUSCULAR | Status: DC | PRN
  Administered 2016-02-27: 10 mL via INTRADERMAL

## 2016-02-27 MED ORDER — DEXTROSE 5 % IV SOLN
INTRAVENOUS | Status: AC
  Filled 2016-02-27 (×27): qty 1.5

## 2016-02-27 MED ORDER — FAMOTIDINE 20 MG PO TABS: 40.0000 mg | ORAL_TABLET | ORAL | Status: DC | PRN

## 2016-02-27 MED ORDER — SODIUM CHLORIDE 0.9 % IV SOLN
INTRAVENOUS | Status: DC
  Administered 2016-02-27: 08:00:00 via INTRAVENOUS

## 2016-02-27 MED ORDER — HEPARIN SODIUM (PORCINE) 1000 UNIT/ML IJ SOLN
INTRAMUSCULAR | Status: DC | PRN
  Administered 2016-02-27: 4000 [IU] via INTRAVENOUS

## 2016-02-27 MED ORDER — FENTANYL CITRATE (PF) 100 MCG/2ML IJ SOLN
INTRAMUSCULAR | Status: DC | PRN
  Administered 2016-02-27 (×3): 50 ug via INTRAVENOUS

## 2016-02-27 MED ORDER — LIDOCAINE HCL (PF) 1 % IJ SOLN
INTRAMUSCULAR | Status: AC
  Filled 2016-02-27: qty 30

## 2016-02-27 MED ORDER — DEXTROSE 5 % IV SOLN
1.5000 g | INTRAVENOUS | Status: AC
  Administered 2016-02-27: 1.5 g via INTRAVENOUS

## 2016-02-27 MED ORDER — MIDAZOLAM HCL 2 MG/2ML IJ SOLN
INTRAMUSCULAR | Status: DC | PRN
  Administered 2016-02-27 (×2): 2 mg via INTRAVENOUS

## 2016-02-27 SURGICAL SUPPLY — 18 items
BALLN DORADO 8X40X80 (BALLOONS) ×3
BALLN LUTONIX DCB 4X40X130 (BALLOONS) ×3
BALLOON DORADO 8X40X80 (BALLOONS) ×1 IMPLANT
BALLOON LUTONIX DCB 4X40X130 (BALLOONS) ×1 IMPLANT
CATH KA2 5FR 65CM (CATHETERS) ×3 IMPLANT
DEVICE PRESTO INFLATION (MISCELLANEOUS) ×3 IMPLANT
DRAPE BRACHIAL (DRAPES) ×3 IMPLANT
GRAFT STENT FLAIR ENDOVAS 8X50 (Permanent Stent) ×3 IMPLANT
GRAFT STENT STR ENDOVAS 8X40 (Stent) ×3 IMPLANT
GUIDEWIRE ANGLED .035 180CM (WIRE) ×3 IMPLANT
KIT THROMB PERC PTD (MISCELLANEOUS) ×3 IMPLANT
PACK ANGIOGRAPHY (CUSTOM PROCEDURE TRAY) ×3 IMPLANT
SET INTRO CAPELLA COAXIAL (SET/KITS/TRAYS/PACK) ×3 IMPLANT
SHEATH BRITE TIP 6FRX5.5 (SHEATH) ×6 IMPLANT
SHEATH BRITE TIP 7FRX5.5 (SHEATH) ×3 IMPLANT
TOWEL OR 17X26 4PK STRL BLUE (TOWEL DISPOSABLE) ×3 IMPLANT
WIRE MAGIC TOR.035 180C (WIRE) ×6 IMPLANT
WIRE MAGIC TORQUE 260C (WIRE) ×3 IMPLANT

## 2016-02-27 NOTE — Op Note (Signed)
OPERATIVE NOTE   PROCEDURE: 1. Contrast injection left arm brachial axillary  AV access 2. Percutaneous transluminal angioplasty and stent placement venous anastomosis left arm brachial axillary graft using an 8 x 50 flared Flair with the addition of an 8 x 40 straight Flair 3. Mechanical thrombectomy with the Trerotola device left arm brachial axillary graft 4. Arterial percutaneous transluminal angioplasty of the midportion of the brachial artery using a 4 x 40 Lutonix balloon  PRE-OPERATIVE DIAGNOSIS: Complication of dialysis access                                                       End Stage Renal Disease  POST-OPERATIVE DIAGNOSIS: same as above   SURGEON: [name], M.D.  ANESTHESIA: Conscious Sedation   ESTIMATED BLOOD LOSS: minimal  FINDING(S): 1. Thrombus within the graft with a narrowing of the venous outflow at the anastomosis as well as narrowing of the brachial artery and its midportion  SPECIMEN(S):  None  CONTRAST: 50 cc  FLUOROSCOPY TIME: 6.8 minutes  INDICATIONS: BONNIE OVERDORF is a 70 y.o. female who  presents with thrombosed left arm brachial axillary AV access.  The patient is scheduled for angiography with possible intervention of the AV access.  The patient is aware the risks include but are not limited to: bleeding, infection, thrombosis of the cannulated access, and possible anaphylactic reaction to the contrast.  The patient acknowledges if the access can not be salvaged a tunneled catheter will be needed and will be placed during this procedure.  The patient is aware of the risks of the procedure and elects to proceed with the angiogram and intervention.  DESCRIPTION: After full informed written consent was obtained, the patient was brought back to the Special Procedure suite and placed supine position.  Appropriate cardiopulmonary monitors were placed.  The left arm was prepped and draped in the standard fashion.  Appropriate timeout is called.  The brachial axillary graft  was cannulated with a micropuncture needle.  The microwire was advanced and the needle was exchanged for  a microsheath.  The J-wire was then advanced and a 6 Fr sheath inserted.  Hand injections were completed to image the access and confirmed thrombosis. Floppy Glidewire and Kumpe catheter were then advanced into the axillary vein where the central venous portion was evaluated and found to be widely patent. The Kumpe was later reversed through the retrograde sheath and the AV access was imaged from the arterial anastomosis through the entire access.  The central venous structures were also imaged by hand injections.  Based on the images,  3000 units of heparin was given.  Trerotola device was then delivered onto the field and multiple passes were made through the venous portion. Follow-up imaging demonstrated resolution of thrombus with stricture at the venous anastomosis. The second sheath was then started in a retrograde fashion similar to that described above. Kumpe and the Glidewire were then advanced through as noted imaging was performed and subsequently the Trerotola device was used to perform thrombectomy of the arterial portion of the graft.  The glide wire was negotiated through the arterial portion into the more proximal brachial artery where hand injection contrast demonstrated a stricture within the midportion of the brachial artery. Distal to this the arterial anastomosis was patent and the arterial portion was free of thrombus.  Based on these images the Magic torque wire was then advanced so that the tip was in the subclavian artery and a 4 x 4 Lutonix balloon was passed across the brachial artery stricture inflated to 6 atm for 2 full minutes. The Kumpe was then reintroduced and imaging demonstrated resolution of this area of stricture.  The venous portion was then reimaged Magic torque wire was introduced and a 8 x 50 flared stent was deployed across the venous  stricture. This did jump forward by approximately a centimeter and a half and therefore an 8 x 40 straight flared was added overlapping the 2 stents by approximately 15 mm and allowing for good purchase within the graft itself. This area was then postdilated with an 8 x 40 Dor[place] balloon to 16 atm. Follow-up imaging demonstrated complete resolution of the venous stricture with rapid flow of contrast.  Follow-up imaging demonstrates complete resolution of the stricture with rapid flow of contrast through the graft, the central venous anatomy is preserved.  A 4-0 Monocryl purse-string suture was sewn around both of the sheaths.  The sheaths were removed and light pressure was applied.  A sterile bandage was applied to the puncture site.    COMPLICATIONS: None  CONDITION: Carlynn Purl, M.D [facility] Office: [phone]  02/27/2016 10:24 AM

## 2016-02-27 NOTE — H&P (Signed)
[facility] History & Physical Update  The patient was interviewed and re-examined.  The patient's previous History and Physical has been reviewed and is unchanged.  There is no change in the plan of care. We plan to proceed with the scheduled procedure.  Tricia Pledger, Dolores Lory, MD  02/27/2016, 8:24 AM

## 2016-02-27 NOTE — H&P (Signed)
[facility] Admission History & Physical  MRN : [id number]  Kathleen Sharp is a 70 y.o. ([birth year]) female who presents with chief complaint of clotted AV access.  History of Present Illness: I am asked to evaluate the patient by her dialysis center. She is sent to me having missed her last dialysis run. At that time, she presented and was found to have an absent thrill and bruit. Therefore they were unable to cannulate G was unable to get her dialysis. She denies fever chills. She denies left arm pain.  Current Facility-Administered Medications  Medication Dose Route Frequency Provider Last Rate Last Dose  . 0.9 %  sodium chloride infusion   Intravenous Continuous Janalyn Harder Stegmayer, PA-C 10 mL/hr at 02/27/16 0803    . cefUROXime (ZINACEF) 1.5 g in dextrose 5 % 50 mL IVPB  1.5 g Intravenous 30 min Pre-Op Kimberly A Stegmayer, PA-C   1.5 g at 02/27/16 0919  . famotidine (PEPCID) tablet 40 mg  40 mg Oral PRN Janalyn Harder Stegmayer, PA-C      . fentaNYL (SUBLIMAZE) injection    PRN [name], MD   50 mcg at 02/27/16 0920  . HYDROmorphone (DILAUDID) injection 1 mg  1 mg Intravenous Once [employer], PA-C      . methylPREDNISolone sodium succinate (SOLU-MEDROL) 125 mg/2 mL injection 125 mg  125 mg Intravenous PRN Janalyn Harder Stegmayer, PA-C      . midazolam (VERSED) injection    PRN [name], MD   2 mg at 02/27/16 0920  . ondansetron (ZOFRAN) injection 4 mg  4 mg Intravenous Q6H PRN Janalyn Harder Stegmayer, PA-C       Facility-Administered Medications Ordered in Other Encounters  Medication Dose Route Frequency Provider Last Rate Last Dose  . epoetin alfa (EPOGEN,PROCRIT) injection 10,000 Units  10,000 Units Subcutaneous Once Lequita Asal, MD   10,000 Units at 08/16/15 1212    Past Medical History  Diagnosis Date  . Anemia   . Hypertension   . Arthritis   . Hypercholesterolemia   . Chronic kidney disease     Chronic Kidney Disease     Past Surgical History  Procedure Laterality Date  . Abdominal hysterectomy    . Capd insertion N/A 11/24/2015    Procedure: Exploratory laparoscopy;  Surgeon: [name], MD;  Location: [facility];  Service: Vascular;  Laterality: N/A;, attempted insertion but unable due to scar tissue  . Av fistula placement Left 12/15/2015    Procedure: INSERTION OF ARTERIOVENOUS (AV) GORE-TEX GRAFT ARM             ( BRACHIAL AXILLARY GRAFT );  Surgeon: [name], MD;  Location: [facility] OR[facility];  Service: Vascular;  Laterality: Left;    Social History Social History  Substance Use Topics  . Smoking status: Former Smoker -- 1.00 packs/day for 15 years    Types: Cigarettes    Quit date: 09/16/2012  . Smokeless tobacco: Never Used  . Alcohol Use: No    Family History Family History  Problem Relation Age of Onset  . Kidney disease Father   . Kidney disease Brother   . Heart disease Mother   No family history of bleeding clotting disorders porphyria or autoimmune disease.  No Known Allergies   REVIEW OF SYSTEMS (Negative unless checked)  Constitutional: $RemoveBeforeDE'[]'qLEkObArOhhAaBU$ Weight loss  $Rem'[]'KKaF$ Fever  $Remo'[]'FJlFb$ Chills Cardiac: $RemoveBeforeD'[]'nOMzeyaoRwJlqj$ Chest pain   '[]'$ Chest pressure   '[]'$ Palpitations   '[]'$ Shortness of breath when laying flat   '[]'$   Shortness of breath at rest   [] Shortness of breath with exertion. Vascular:  [] Pain in legs with walking   [] Pain in legs at rest   [] Pain in legs when laying flat   [] Claudication   [] Pain in feet when walking  [] Pain in feet at rest  [] Pain in feet when laying flat   [] History of DVT   [] Phlebitis   [] Swelling in legs   [] Varicose veins   [] Non-healing ulcers Pulmonary:   [] Uses home oxygen   [] Productive cough   [] Hemoptysis   [] Wheeze  [] COPD   [] Asthma Neurologic:  [] Dizziness  [] Blackouts   [] Seizures   [] History of stroke   [] History of TIA  [] Aphasia   [] Temporary blindness   [] Dysphagia   [] Weakness or numbness in arms   [] Weakness or numbness in legs Musculoskeletal:  [] Arthritis   [] Joint  swelling   [] Joint pain   [] Low back pain Hematologic:  [] Easy bruising  [] Easy bleeding   [] Hypercoagulable state   [] Anemic  [] Hepatitis Gastrointestinal:  [] Blood in stool   [] Vomiting blood  [] Gastroesophageal reflux/heartburn   [] Difficulty swallowing. Genitourinary:  [] Chronic kidney disease   [] Difficult urination  [] Frequent urination  [] Burning with urination   [] Blood in urine Skin:  [] Rashes   [] Ulcers   [] Wounds Psychological:  [] History of anxiety   []  History of major depression.  Physical Examination  Filed Vitals:   02/27/16 0735  BP: 165/81  Temp: 97.9 F (36.6 C)  TempSrc: Oral  Resp: 20  Height: 5\' 2"  (1.575 m)  Weight: 81.647 kg (180 lb)  SpO2: 99%   Body mass index is 32.91 kg/(m^2). Gen: WD/WN, NAD Head: [place]/AT, No temporalis wasting.  Ear/Nose/Throat: Hearing grossly intact, nares w/o erythema or drainage, oropharynx w/o Erythema/Exudate, Eyes: PERRLA, EOMI.  Neck: Supple, no nuchal rigidity.  No bruit or JVD.  Pulmonary:  Good air movement, clear to auscultation bilaterally, no increased work of respiration or use of accessory muscles  Cardiac: RRR, normal S1, S2, no Murmurs, rubs or gallops. Vascular: Left arm brachial axillary dialysis graft no thrill no bruit. Vessel Right Left  Radial Palpable Palpable  Ulnar Palpable Palpable  Brachial Palpable Palpable   Gastrointestinal: soft, non-tender/non-distended. No guarding/reflex. No masses, surgical incisions, or scars. Musculoskeletal: M/S 5/5 throughout.  No deformity or atrophy. Neurologic: CN 2-12 intact. Pain and light touch intact in extremities.  Symmetrical.  Speech is fluent. Motor exam as listed above. Psychiatric: Judgment intact, Mood & affect appropriate for pt's clinical situation. Dermatologic: No rashes or ulcers noted.  No cellulitis or open wounds. Lymph : No Cervical, Axillary, or Inguinal lymphadenopathy.   CBC Lab Results  Component Value Date   WBC 9.3 12/07/2015   HGB 8.9*  01/10/2016   HCT 33.0* 12/15/2015   MCV 75.7* 12/07/2015   PLT 372 12/07/2015    BMET    Component Value Date/Time   NA 139 12/15/2015 0845   K 3.8 12/15/2015 0845   CL 104 12/07/2015 1133   CO2 24 12/07/2015 1133   GLUCOSE 91 12/15/2015 0845   BUN 58* 12/07/2015 1133   CREATININE 5.60* 12/07/2015 1133   CALCIUM 9.4 12/07/2015 1133   GFRNONAA 7* 12/07/2015 1133   GFRAA 8* 12/07/2015 1133   CrCl cannot be calculated (Patient has no serum creatinine result on file.).  COAG Lab Results  Component Value Date   INR 1.18 12/07/2015   INR 1.05 11/14/2015    Radiology No results found.   Assessment/Plan 1.  Complication dialysis device with thrombosis  AV access:  Patient's left arm brachial axillary dialysis access is thrombosed. The patient will undergo thrombectomy using interventional techniques. Potassium will be drawn to ensure that it is an appropriate level prior to performing thrombectomy. 2.  End-stage renal disease requiring hemodialysis:  Patient will continue dialysis therapy without further interruption if a successful thrombectomy is not achieved then catheter will be placed. Dialysis has already been arranged since the patient missed their previous session 3.  Hypertension:  Patient will continue medical management; nephrology is following no changes in oral medications. 4. Hypercholesterolemia:  Patient will continue her statin therapy 5.  Arthritis:  Patient will continue her nonsteroidals as prescribed.    Schnier, Dolores Lory, MD  02/27/2016 9:26 AM

## 2016-02-27 NOTE — Discharge Instructions (Signed)
Dialysis Vascular Access Malfunction A vascular access is an entrance to your blood vessels that can be used for dialysis. A vascular access can be made in one of several ways:   Joining an artery to a vein under your skin to make a bigger blood vessel called a fistula.   Joining an artery to a vein under your skin using a soft tube called a graft.   Placing a thin, flexible tube (catheter) in a large vein, usually in your neck.  A vascular access may malfunction or become blocked.  WHAT CAN CAUSE YOUR VASCULAR ACCESS TO MALFUNCTION?  Infection (common).   A blood clot inside a part of the fistula, graft, or catheter. A blood clot can completely or partially block the flow of blood.   A kink in the graft or catheter.   A collection of blood (called a hematoma or bruise) next to the graft or catheter that pushes against it, blocking the flow of blood.  WHAT ARE SIGNS AND SYMPTOMS OF VASCULAR ACCESS MALFUNCTION?  There is a change in the vibration or pulse of your fistula or graft.  The vibration or pulse of your fistula or graft is gone.   There is new or unusual swelling of the area around the access.   There was an unsuccessful puncture of your access by the dialysis team.   The flow of blood through the fistula, graft, or catheter is too slow for effective dialysis.   When routine dialysis is completed and the needle is removed, bleeding lasts for too long a time.  WHAT HAPPENS IF MY VASCULAR ACCESS MALFUNCTIONS? Your health care provider may order blood work, cultures, or an X-ray test in order to learn what may be wrong with your vascular access. The X-ray test involves the injection of a liquid into the vascular access. The liquid shows up on the X-ray and allows your health care provider to see if there is a blockage in the vascular access.  Treatment varies depending on the cause of the malfunction:   If the vascular access is infected, your health care provider  may prescribe antibiotic medicine to control the infection.   If a clot is found in the vascular access, you may need surgery to remove the clot.   If a blockage in the vascular access is due to some other cause (such as a kink in a graft), then you will likely need surgery to unblock or replace the graft.  HOME CARE INSTRUCTIONS: Follow up with your surgeon or other health care provider if you were instructed to do so. This is very important. Any delay in follow-up could cause permanent dysfunction of the vascular access, which may be dangerous.  SEEK MEDICAL CARE IF:   Fever develops.   Swelling and pain around the vascular access gets worse or new pain develops.  Pain, numbness, or an unusual pale skin color develops in the hand on the side of your vascular access. SEEK IMMEDIATE MEDICAL CARE IF: Unusual bleeding develops at the location of the vascular access. MAKE SURE YOU:  Understand these instructions.  Will watch your condition.  Will get help right away if you are not doing well or get worse.   This information is not intended to replace advice given to you by your health care provider. Make sure you discuss any questions you have with your health care provider.   Document Released: 08/05/2006 Document Revised: 09/23/2014 Document Reviewed: 02/04/2013 Elsevier Interactive Patient Education [employer].

## 2016-02-28 ENCOUNTER — Encounter: Payer: Self-pay | Admitting: Vascular Surgery

## 2016-02-28 DIAGNOSIS — Z992 Dependence on renal dialysis: Secondary | ICD-10-CM | POA: Diagnosis not present

## 2016-02-28 DIAGNOSIS — Z23 Encounter for immunization: Secondary | ICD-10-CM | POA: Diagnosis not present

## 2016-02-28 DIAGNOSIS — N186 End stage renal disease: Secondary | ICD-10-CM | POA: Diagnosis not present

## 2016-02-28 DIAGNOSIS — N2581 Secondary hyperparathyroidism of renal origin: Secondary | ICD-10-CM | POA: Diagnosis not present

## 2016-03-01 DIAGNOSIS — Z992 Dependence on renal dialysis: Secondary | ICD-10-CM | POA: Diagnosis not present

## 2016-03-01 DIAGNOSIS — N186 End stage renal disease: Secondary | ICD-10-CM | POA: Diagnosis not present

## 2016-03-01 DIAGNOSIS — Z23 Encounter for immunization: Secondary | ICD-10-CM | POA: Diagnosis not present

## 2016-03-04 DIAGNOSIS — Z23 Encounter for immunization: Secondary | ICD-10-CM | POA: Diagnosis not present

## 2016-03-04 DIAGNOSIS — Z992 Dependence on renal dialysis: Secondary | ICD-10-CM | POA: Diagnosis not present

## 2016-03-04 DIAGNOSIS — N186 End stage renal disease: Secondary | ICD-10-CM | POA: Diagnosis not present

## 2016-03-06 DIAGNOSIS — Z992 Dependence on renal dialysis: Secondary | ICD-10-CM | POA: Diagnosis not present

## 2016-03-06 DIAGNOSIS — N186 End stage renal disease: Secondary | ICD-10-CM | POA: Diagnosis not present

## 2016-03-06 DIAGNOSIS — Z23 Encounter for immunization: Secondary | ICD-10-CM | POA: Diagnosis not present

## 2016-03-08 DIAGNOSIS — Z992 Dependence on renal dialysis: Secondary | ICD-10-CM | POA: Diagnosis not present

## 2016-03-08 DIAGNOSIS — N186 End stage renal disease: Secondary | ICD-10-CM | POA: Diagnosis not present

## 2016-03-08 DIAGNOSIS — Z23 Encounter for immunization: Secondary | ICD-10-CM | POA: Diagnosis not present

## 2016-03-11 DIAGNOSIS — Z23 Encounter for immunization: Secondary | ICD-10-CM | POA: Diagnosis not present

## 2016-03-11 DIAGNOSIS — N186 End stage renal disease: Secondary | ICD-10-CM | POA: Diagnosis not present

## 2016-03-11 DIAGNOSIS — Z992 Dependence on renal dialysis: Secondary | ICD-10-CM | POA: Diagnosis not present

## 2016-03-13 ENCOUNTER — Inpatient Hospital Stay: Payer: Commercial Managed Care - HMO

## 2016-03-13 DIAGNOSIS — Z992 Dependence on renal dialysis: Secondary | ICD-10-CM | POA: Diagnosis not present

## 2016-03-13 DIAGNOSIS — N186 End stage renal disease: Secondary | ICD-10-CM | POA: Diagnosis not present

## 2016-03-13 DIAGNOSIS — Z23 Encounter for immunization: Secondary | ICD-10-CM | POA: Diagnosis not present

## 2016-03-15 DIAGNOSIS — N186 End stage renal disease: Secondary | ICD-10-CM | POA: Diagnosis not present

## 2016-03-15 DIAGNOSIS — Z23 Encounter for immunization: Secondary | ICD-10-CM | POA: Diagnosis not present

## 2016-03-15 DIAGNOSIS — Z992 Dependence on renal dialysis: Secondary | ICD-10-CM | POA: Diagnosis not present

## 2016-03-18 DIAGNOSIS — N186 End stage renal disease: Secondary | ICD-10-CM | POA: Diagnosis not present

## 2016-03-18 DIAGNOSIS — Z992 Dependence on renal dialysis: Secondary | ICD-10-CM | POA: Diagnosis not present

## 2016-03-18 DIAGNOSIS — Z23 Encounter for immunization: Secondary | ICD-10-CM | POA: Diagnosis not present

## 2016-03-20 DIAGNOSIS — Z992 Dependence on renal dialysis: Secondary | ICD-10-CM | POA: Diagnosis not present

## 2016-03-20 DIAGNOSIS — Z23 Encounter for immunization: Secondary | ICD-10-CM | POA: Diagnosis not present

## 2016-03-20 DIAGNOSIS — N186 End stage renal disease: Secondary | ICD-10-CM | POA: Diagnosis not present

## 2016-03-22 DIAGNOSIS — N186 End stage renal disease: Secondary | ICD-10-CM | POA: Diagnosis not present

## 2016-03-22 DIAGNOSIS — Z23 Encounter for immunization: Secondary | ICD-10-CM | POA: Diagnosis not present

## 2016-03-22 DIAGNOSIS — Z992 Dependence on renal dialysis: Secondary | ICD-10-CM | POA: Diagnosis not present

## 2016-03-25 DIAGNOSIS — Z992 Dependence on renal dialysis: Secondary | ICD-10-CM | POA: Diagnosis not present

## 2016-03-25 DIAGNOSIS — Z23 Encounter for immunization: Secondary | ICD-10-CM | POA: Diagnosis not present

## 2016-03-25 DIAGNOSIS — N186 End stage renal disease: Secondary | ICD-10-CM | POA: Diagnosis not present

## 2016-03-27 DIAGNOSIS — Z23 Encounter for immunization: Secondary | ICD-10-CM | POA: Diagnosis not present

## 2016-03-27 DIAGNOSIS — Z992 Dependence on renal dialysis: Secondary | ICD-10-CM | POA: Diagnosis not present

## 2016-03-27 DIAGNOSIS — N186 End stage renal disease: Secondary | ICD-10-CM | POA: Diagnosis not present

## 2016-03-29 DIAGNOSIS — Z992 Dependence on renal dialysis: Secondary | ICD-10-CM | POA: Diagnosis not present

## 2016-03-29 DIAGNOSIS — Z23 Encounter for immunization: Secondary | ICD-10-CM | POA: Diagnosis not present

## 2016-03-29 DIAGNOSIS — N186 End stage renal disease: Secondary | ICD-10-CM | POA: Diagnosis not present

## 2016-04-01 DIAGNOSIS — Z992 Dependence on renal dialysis: Secondary | ICD-10-CM | POA: Diagnosis not present

## 2016-04-01 DIAGNOSIS — Z23 Encounter for immunization: Secondary | ICD-10-CM | POA: Diagnosis not present

## 2016-04-01 DIAGNOSIS — N186 End stage renal disease: Secondary | ICD-10-CM | POA: Diagnosis not present

## 2016-04-05 DIAGNOSIS — Z992 Dependence on renal dialysis: Secondary | ICD-10-CM | POA: Diagnosis not present

## 2016-04-05 DIAGNOSIS — N186 End stage renal disease: Secondary | ICD-10-CM | POA: Diagnosis not present

## 2016-04-05 DIAGNOSIS — Z23 Encounter for immunization: Secondary | ICD-10-CM | POA: Diagnosis not present

## 2016-04-08 DIAGNOSIS — Z23 Encounter for immunization: Secondary | ICD-10-CM | POA: Diagnosis not present

## 2016-04-08 DIAGNOSIS — N186 End stage renal disease: Secondary | ICD-10-CM | POA: Diagnosis not present

## 2016-04-08 DIAGNOSIS — Z992 Dependence on renal dialysis: Secondary | ICD-10-CM | POA: Diagnosis not present

## 2016-04-10 ENCOUNTER — Other Ambulatory Visit: Payer: Self-pay | Admitting: Vascular Surgery

## 2016-04-12 ENCOUNTER — Ambulatory Visit
Admission: RE | Admit: 2016-04-12 | Discharge: 2016-04-12 | Disposition: A | Discharge status: Home / Self Care | Payer: Commercial Managed Care - HMO | Source: Ambulatory Visit | Attending: Vascular Surgery | Admitting: Vascular Surgery

## 2016-04-12 ENCOUNTER — Encounter
Admission: RE | Disposition: A | Discharge status: Home / Self Care | Payer: Self-pay | Source: Ambulatory Visit | Attending: Vascular Surgery

## 2016-04-12 DIAGNOSIS — E669 Obesity, unspecified: Secondary | ICD-10-CM | POA: Diagnosis not present

## 2016-04-12 DIAGNOSIS — E7800 Pure hypercholesterolemia, unspecified: Secondary | ICD-10-CM | POA: Diagnosis not present

## 2016-04-12 DIAGNOSIS — E1122 Type 2 diabetes mellitus with diabetic chronic kidney disease: Secondary | ICD-10-CM | POA: Diagnosis not present

## 2016-04-12 DIAGNOSIS — M1990 Unspecified osteoarthritis, unspecified site: Secondary | ICD-10-CM | POA: Insufficient documentation

## 2016-04-12 DIAGNOSIS — I2510 Atherosclerotic heart disease of native coronary artery without angina pectoris: Secondary | ICD-10-CM | POA: Insufficient documentation

## 2016-04-12 DIAGNOSIS — Z841 Family history of disorders of kidney and ureter: Secondary | ICD-10-CM | POA: Insufficient documentation

## 2016-04-12 DIAGNOSIS — I120 Hypertensive chronic kidney disease with stage 5 chronic kidney disease or end stage renal disease: Secondary | ICD-10-CM | POA: Insufficient documentation

## 2016-04-12 DIAGNOSIS — E785 Hyperlipidemia, unspecified: Secondary | ICD-10-CM | POA: Diagnosis not present

## 2016-04-12 DIAGNOSIS — I10 Essential (primary) hypertension: Secondary | ICD-10-CM | POA: Diagnosis not present

## 2016-04-12 DIAGNOSIS — Z8249 Family history of ischemic heart disease and other diseases of the circulatory system: Secondary | ICD-10-CM | POA: Insufficient documentation

## 2016-04-12 DIAGNOSIS — Z992 Dependence on renal dialysis: Secondary | ICD-10-CM | POA: Insufficient documentation

## 2016-04-12 DIAGNOSIS — Z01810 Encounter for preprocedural cardiovascular examination: Secondary | ICD-10-CM | POA: Diagnosis not present

## 2016-04-12 DIAGNOSIS — E119 Type 2 diabetes mellitus without complications: Secondary | ICD-10-CM | POA: Diagnosis not present

## 2016-04-12 DIAGNOSIS — I119 Hypertensive heart disease without heart failure: Secondary | ICD-10-CM | POA: Diagnosis not present

## 2016-04-12 DIAGNOSIS — Z90710 Acquired absence of both cervix and uterus: Secondary | ICD-10-CM | POA: Diagnosis not present

## 2016-04-12 DIAGNOSIS — T82868A Thrombosis of vascular prosthetic devices, implants and grafts, initial encounter: Secondary | ICD-10-CM | POA: Diagnosis not present

## 2016-04-12 DIAGNOSIS — Y832 Surgical operation with anastomosis, bypass or graft as the cause of abnormal reaction of the patient, or of later complication, without mention of misadventure at the time of the procedure: Secondary | ICD-10-CM | POA: Diagnosis not present

## 2016-04-12 DIAGNOSIS — N186 End stage renal disease: Secondary | ICD-10-CM | POA: Insufficient documentation

## 2016-04-12 DIAGNOSIS — M189 Osteoarthritis of first carpometacarpal joint, unspecified: Secondary | ICD-10-CM | POA: Diagnosis not present

## 2016-04-12 DIAGNOSIS — Z87891 Personal history of nicotine dependence: Secondary | ICD-10-CM | POA: Diagnosis not present

## 2016-04-12 DIAGNOSIS — I129 Hypertensive chronic kidney disease with stage 1 through stage 4 chronic kidney disease, or unspecified chronic kidney disease: Secondary | ICD-10-CM | POA: Diagnosis not present

## 2016-04-12 HISTORY — PX: PERIPHERAL VASCULAR CATHETERIZATION: SHX172C

## 2016-04-12 LAB — POTASSIUM (ARMC VASCULAR LAB ONLY): Potassium (ARMC vascular lab): 4.1 (ref 3.5–5.1)

## 2016-04-12 SURGERY — THROMBECTOMY
Anesthesia: Moderate Sedation | Laterality: Left

## 2016-04-12 MED ORDER — MIDAZOLAM HCL 2 MG/2ML IJ SOLN
INTRAMUSCULAR | Status: DC | PRN
Start: 2016-04-12 — End: 2016-04-12
  Administered 2016-04-12: 1 mg via INTRAVENOUS
  Administered 2016-04-12: 2 mg via INTRAVENOUS

## 2016-04-12 MED ORDER — HEPARIN SODIUM (PORCINE) 1000 UNIT/ML IJ SOLN
INTRAMUSCULAR | Status: AC
  Filled 2016-04-12: qty 1

## 2016-04-12 MED ORDER — ONDANSETRON HCL 4 MG/2ML IJ SOLN: 4.0000 mg | Freq: Four times a day (QID) | INTRAMUSCULAR | Status: DC | PRN

## 2016-04-12 MED ORDER — MIDAZOLAM HCL 5 MG/5ML IJ SOLN
INTRAMUSCULAR | Status: AC
  Filled 2016-04-12: qty 5

## 2016-04-12 MED ORDER — FAMOTIDINE 20 MG PO TABS: 40.0000 mg | ORAL_TABLET | ORAL | Status: DC | PRN

## 2016-04-12 MED ORDER — FENTANYL CITRATE (PF) 100 MCG/2ML IJ SOLN
INTRAMUSCULAR | Status: DC | PRN
  Administered 2016-04-12: 50 ug via INTRAVENOUS
  Administered 2016-04-12: 25 ug via INTRAVENOUS

## 2016-04-12 MED ORDER — HYDROMORPHONE HCL 1 MG/ML IJ SOLN: 1.0000 mg | Freq: Once | INTRAMUSCULAR | Status: DC

## 2016-04-12 MED ORDER — FENTANYL CITRATE (PF) 100 MCG/2ML IJ SOLN
INTRAMUSCULAR | Status: AC
  Filled 2016-04-12: qty 2

## 2016-04-12 MED ORDER — HEPARIN (PORCINE) IN NACL 2-0.9 UNIT/ML-% IJ SOLN
INTRAMUSCULAR | Status: AC
  Filled 2016-04-12: qty 1000

## 2016-04-12 MED ORDER — DEXTROSE 5 % IV SOLN: 1.5000 g | INTRAVENOUS | Status: DC

## 2016-04-12 MED ORDER — METHYLPREDNISOLONE SODIUM SUCC 125 MG IJ SOLR: 125.0000 mg | INTRAMUSCULAR | Status: DC | PRN

## 2016-04-12 MED ORDER — IOPAMIDOL (ISOVUE-300) INJECTION 61%
INTRAVENOUS | Status: DC | PRN
  Administered 2016-04-12: 40 mL via INTRA_ARTERIAL

## 2016-04-12 MED ORDER — HEPARIN SODIUM (PORCINE) 1000 UNIT/ML IJ SOLN
INTRAMUSCULAR | Status: DC | PRN
  Administered 2016-04-12: 4000 [IU] via INTRAVENOUS

## 2016-04-12 MED ORDER — LIDOCAINE HCL (PF) 1 % IJ SOLN
INTRAMUSCULAR | Status: AC
  Filled 2016-04-12: qty 30

## 2016-04-12 MED ORDER — SODIUM CHLORIDE 0.9 % IV SOLN
INTRAVENOUS | Status: DC
  Administered 2016-04-12: 1000 mL via INTRAVENOUS

## 2016-04-12 SURGICAL SUPPLY — 15 items
BALLN ARMADA 9X40X80 (BALLOONS) ×3
BALLN DORADO 8X60X80 (BALLOONS) ×9
BALLOON ARMADA 9X40X80 (BALLOONS) ×1 IMPLANT
BALLOON DORADO 8X60X80 (BALLOONS) ×3 IMPLANT
CATH KA2 5FR 65CM (CATHETERS) ×3 IMPLANT
DEVICE PRESTO INFLATION (MISCELLANEOUS) ×3 IMPLANT
FLUENCY STENT 9X60X117 (Permanent Stent) ×3 IMPLANT
GLIDEWIRE .035X150 LONG TAPER (WIRE) ×3 IMPLANT
KIT THROMB PERC PTD (MISCELLANEOUS) ×3 IMPLANT
PACK ANGIOGRAPHY (CUSTOM PROCEDURE TRAY) ×3 IMPLANT
SET INTRO CAPELLA COAXIAL (SET/KITS/TRAYS/PACK) ×3 IMPLANT
SHEATH BRITE TIP 6FRX5.5 (SHEATH) ×6 IMPLANT
SHEATH BRITE TIP 7FRX5.5 (SHEATH) ×3 IMPLANT
TOWEL OR 17X26 4PK STRL BLUE (TOWEL DISPOSABLE) ×3 IMPLANT
WIRE MAGIC TORQUE 260C (WIRE) ×3 IMPLANT

## 2016-04-12 NOTE — H&P (Signed)
[facility] Admission History & Physical  MRN : [id number]  Kathleen Sharp is a 70 y.o. (10-04-1945) female who presents with chief complaint of my dialysis access is clotted.  History of Present Illness: The patient is sent by the dialysis center secondary to apparent thrombosis of the dialysis access. Directly contacted yesterday and so they have missed at least one dialysis run. Patient denies shortness of breath. Patient denies arm pain. No fever or chills. Up until this point she denies problems with recent dialysis runs.  Current Facility-Administered Medications  Medication Dose Route Frequency Provider Last Rate Last Dose  . 0.9 %  sodium chloride infusion   Intravenous Continuous Kimberly A Stegmayer, PA-C      . cefUROXime (ZINACEF) 1.5 g in dextrose 5 % 50 mL IVPB  1.5 g Intravenous 30 min Pre-Op Kimberly A Stegmayer, PA-C      . famotidine (PEPCID) tablet 40 mg  40 mg Oral PRN Janalyn Harder Stegmayer, PA-C      . HYDROmorphone (DILAUDID) injection 1 mg  1 mg Intravenous Once [employer], PA-C      . methylPREDNISolone sodium succinate (SOLU-MEDROL) 125 mg/2 mL injection 125 mg  125 mg Intravenous PRN Kimberly A Stegmayer, PA-C      . ondansetron (ZOFRAN) injection 4 mg  4 mg Intravenous Q6H PRN Janalyn Harder Stegmayer, PA-C       Facility-Administered Medications Ordered in Other Encounters  Medication Dose Route Frequency Provider Last Rate Last Dose  . epoetin alfa (EPOGEN,PROCRIT) injection 10,000 Units  10,000 Units Subcutaneous Once Lequita Asal, MD        Past Medical History:  Diagnosis Date  . Anemia   . Arthritis   . Chronic kidney disease    Chronic Kidney Disease  . Hypercholesterolemia   . Hypertension     Past Surgical History:  Procedure Laterality Date  . ABDOMINAL HYSTERECTOMY    . AV FISTULA PLACEMENT Left 12/15/2015   Procedure: INSERTION OF ARTERIOVENOUS (AV) GORE-TEX GRAFT ARM             ( BRACHIAL AXILLARY GRAFT  );  Surgeon: [name], MD;  Location: [facility];  Service: Vascular;  Laterality: Left;  . CAPD INSERTION N/A 11/24/2015   Procedure: Exploratory laparoscopy;  Surgeon: [name], MD;  Location: [facility];  Service: Vascular;  Laterality: N/A;, attempted insertion but unable due to scar tissue  . PERIPHERAL VASCULAR CATHETERIZATION Left 02/27/2016   Procedure: Thrombectomy;  Surgeon: [name], MD;  Location: [facility];  Service: Cardiovascular;  Laterality: Left;    Social History Social History  Substance Use Topics  . Smoking status: Former Smoker    Packs/day: 1.00    Years: 15.00    Types: Cigarettes    Quit date: 09/16/2012  . Smokeless tobacco: Never Used  . Alcohol use No    Family History Family History  Problem Relation Age of Onset  . Kidney disease Father   . Heart disease Mother   . Kidney disease Brother   No family history of bleeding clotting disorders porphyria or autoimmune disease.  No Known Allergies   REVIEW OF SYSTEMS (Negative unless checked)  Constitutional: $RemoveBeforeDE'[]'TdriKXfoNNzUBPP$ Weight loss  $Rem'[]'cDGn$ Fever  $Remo'[]'eTgSS$ Chills Cardiac: $RemoveBeforeD'[]'ZXXLtGEhwJOSNi$ Chest pain   '[]'$ Chest pressure   '[]'$ Palpitations   '[]'$ Shortness of breath when laying flat   '[]'$ Shortness of breath at rest   '[]'$ Shortness of breath with exertion. Vascular:  $RemoveBe'[]'EGaDalwFJ$ Pain in legs with walking   '[]'$ Pain  in legs at rest   [] Pain in legs when laying flat   [] Claudication   [] Pain in feet when walking  [] Pain in feet at rest  [] Pain in feet when laying flat   [] History of DVT   [] Phlebitis   [] Swelling in legs   [] Varicose veins   [] Non-healing ulcers Pulmonary:   [] Uses home oxygen   [] Productive cough   [] Hemoptysis   [] Wheeze  [] COPD   [] Asthma Neurologic:  [] Dizziness  [] Blackouts   [] Seizures   [] History of stroke   [] History of TIA  [] Aphasia   [] Temporary blindness   [] Dysphagia   [] Weakness or numbness in arms   [] Weakness or numbness in legs Musculoskeletal:  [] Arthritis   [] Joint swelling   [] Joint pain   [] Low back  pain Hematologic:  [] Easy bruising  [] Easy bleeding   [] Hypercoagulable state   [] Anemic  [] Hepatitis Gastrointestinal:  [] Blood in stool   [] Vomiting blood  [] Gastroesophageal reflux/heartburn   [] Difficulty swallowing. Genitourinary:  [] Chronic kidney disease   [] Difficult urination  [] Frequent urination  [] Burning with urination   [] Blood in urine Skin:  [] Rashes   [] Ulcers   [] Wounds Psychological:  [] History of anxiety   []  History of major depression.  Physical Examination  Vitals:   04/12/16 1358  BP: (!) 164/95  Pulse: 77  Resp: 18  Temp: 97.9 F (36.6 C)  TempSrc: Oral  SpO2: 96%  Weight: 81.6 kg (180 lb)  Height: 5\' 2"  (1.575 m)   Body mass index is 32.92 kg/m. Gen: WD/WN, NAD Head: [place]/AT, No temporalis wasting.  Ear/Nose/Throat: Hearing grossly intact, nares w/o erythema or drainage, oropharynx w/o Erythema/Exudate, Eyes: PERRLA, EOMI.  Neck: Supple, no nuchal rigidity.  No bruit or JVD.  Pulmonary:  Good air movement, clear to auscultation bilaterally, no increased work of respiration or use of accessory muscles  Cardiac: RRR, normal S1, S2, no Murmurs, rubs or gallops. Vascular: Left arm brachial axillary dialysis graft no thrill no bruit Vessel Right Left  Radial Palpable Palpable  Ulnar Palpable Palpable  Brachial Palpable Palpable  Gastrointestinal: soft, non-tender/non-distended. No guarding/reflex. No masses, surgical incisions, or scars. Musculoskeletal: M/S 5/5 throughout.  No deformity or atrophy. Neurologic: CN 2-12 intact. Pain and light touch intact in extremities.  Symmetrical.  Speech is fluent. Motor exam as listed above. Psychiatric: Judgment intact, Mood & affect appropriate for pt's clinical situation. Dermatologic: No rashes or ulcers noted.  No cellulitis or open wounds. Lymph : No Cervical, Axillary, or Inguinal lymphadenopathy.   CBC Lab Results  Component Value Date   WBC 9.3 12/07/2015   HGB 8.9 (L) 01/10/2016   HCT 33.0 (L)  12/15/2015   MCV 75.7 (L) 12/07/2015   PLT 372 12/07/2015    BMET    Component Value Date/Time   NA 139 12/15/2015 0845   K 3.8 12/15/2015 0845   CL 104 12/07/2015 1133   CO2 24 12/07/2015 1133   GLUCOSE 91 12/15/2015 0845   BUN 58 (H) 12/07/2015 1133   CREATININE 5.60 (H) 12/07/2015 1133   CALCIUM 9.4 12/07/2015 1133   GFRNONAA 7 (L) 12/07/2015 1133   GFRAA 8 (L) 12/07/2015 1133   CrCl cannot be calculated (Patient's most recent lab result is older than the maximum 21 days allowed.).  COAG Lab Results  Component Value Date   INR 1.18 12/07/2015   INR 1.05 11/14/2015    Radiology No results found.  Assessment/Plan 1.  Complication dialysis device with thrombosis AV access:  Patient's left arm brachial axillary dialysis  access is thrombosed. The patient will undergo thrombectomy using interventional techniques. Potassium will be drawn to ensure that it is an appropriate level prior to performing thrombectomy. 2.  End-stage renal disease requiring hemodialysis:  Patient will continue dialysis therapy without further interruption if a successful thrombectomy is not achieved then catheter will be placed. Dialysis has already been arranged since the patient missed their previous session 3.  Hypertension:  Patient will continue medical management; nephrology is following no changes in oral medications. 4. Diabetes mellitus:  Glucose will be monitored and oral medications been held this morning once the patient has undergone the patient's procedure po intake will be reinitiated and again Accu-Cheks will be used to assess the blood glucose level and treat as needed. The patient will be restarted on the patient's usual hypoglycemic regime 5.  Coronary artery disease:  EKG will be monitored. Nitrates will be used if needed. The patient's oral cardiac medications will be continued.     Schnier, Dolores Lory, MD  04/12/2016 2:29 PM

## 2016-04-12 NOTE — Discharge Instructions (Signed)
The drugs you were given will stay in your system until tomorrow, so for the next 24 hours you should not.  Drive an automobile. Make any legal decisions.  Drink any alcoholic beverages.  Today you should start with liquids and gradually work up to solid foods as your are able to tolerate them  Resume your regular medications as prescribed by your doctor.  Avoid aspirin for 24 hours.    Change the Band-Aid or dressing as needed.  After a 2 days no dressing as needed.  Avoid strenuous activity for the remainder of the day.  Please notify your primary physician immediately if you have any unusual bleeding, trouble breathing, fever >100 degrees or pain not relieved by the medication your doctor prescribed for your doctor prescribed for you physician  Return to diaslysis  tommorow.

## 2016-04-12 NOTE — Op Note (Signed)
OPERATIVE NOTE   PROCEDURE: 1. Contrast injection left brachial axillary  AV access 2. Percutaneous transluminal angioplasty and stent placement venous portion left brachial axillary graft postdilated to 9 mm using a fluency 9 x 60 graft 3. Mechanical thrombectomy with the Trerotola device left arm brachial axillary dialysis graft  PRE-OPERATIVE DIAGNOSIS: Complication of dialysis access                                                       End Stage Renal Disease  POST-OPERATIVE DIAGNOSIS: same as above   SURGEON: [name], M.D.  ANESTHESIA: Conscious sedation was administered under my direct supervision. IV Versed plus fentanyl were utilized. Continuous ECG, pulse oximetry and blood pressure was monitored throughout the entire procedure.  Conscious sedation was for a total of 50.  ESTIMATED BLOOD LOSS: minimal  FINDING(S): 1. Thrombus within the graft there is a narrowing associated with the leading edge of the previously placed layer stent  SPECIMEN(S):  None  CONTRAST: 40 cc  FLUOROSCOPY TIME: 18 minutes  INDICATIONS: Kathleen Sharp is a 70 y.o. female who  presents with malfunctioning left brachial axillary AV access.  The patient is scheduled for angiography with possible intervention of the AV access.  The patient is aware the risks include but are not limited to: bleeding, infection, thrombosis of the cannulated access, and possible anaphylactic reaction to the contrast.  The patient acknowledges if the access can not be salvaged a tunneled catheter will be needed and will be placed during this procedure.  The patient is aware of the risks of the procedure and elects to proceed with the angiogram and intervention.  DESCRIPTION: After full informed written consent was obtained, the patient was brought back to the Special Procedure suite and placed supine position.  Appropriate cardiopulmonary monitors were placed.  The left arm was prepped and draped in the standard  fashion.  Appropriate timeout is called. The left brachial axillary graft  was cannulated with a micropuncture needle. Cannulation was done with ultrasound guidance. Altered sound was placed in a sterile sleeve graft was imaging was noted be filled with heterogeneous material consistent with a thrombosed graft. Images recorded for the permanent record and the micropuncture needle was accessed under direct visualization.  The microwire was advanced and the needle was exchanged for  a microsheath.  The J-wire was then advanced and a 6 Fr sheath inserted.  Hand injections were completed to image the access from the arterial anastomosis through the entire access.  The central venous structures were also imaged by hand injections.  Based on the images,  3000 units of heparin was given and a wire was negotiated through the strictures within the venous portion of the graft, in particular the leading edge of the previously placed stent. Attempts at advancing the Trerotola through this area were unsuccessful and I elected to angioplasty this lesion 8 mm. However, this still did not allow the Trerotola device to pass and so a 9 x 60 fluency stent was deployed across this lesion and postdilated to 8 mm with the [facility] balloon inflation was for 1 minute to 28 atm.    The Trerotola device was then advanced to the level of the proximal axillary vein opened and then gently pulled back engaging the clot at the level of the stents. The graft  was then cleared of thrombus after 3 passes. Follow-up hand injection demonstrated a excellent result.  Ultrasound was then returned to the field in its sterile sleeve the graft was imaged more proximally on the arm lidocaine was infiltrated followed by a microneedle then a microwire micro-sheath then a J-wire and a 6 [place] sheath. Glidewire and [place] catheter were negotiated into the brachial artery and the exact location the anastomosis was identified. The Trerotola device was then  advanced through this retrograde sheath and the arterial portion of the graft was cleared of thrombus. Several more passes were made through the venous portion. Follow-up imaging demonstrated there were still slight narrowing within the stented segment at the culprit lesion and a 9 x 40 balloon was delivered onto the field and used to angioplasty this area. Inflation was to 16 atm for proximally 1 minute. Reflux imaging was performed with the balloon inflated demonstrating wide patency of the arterial portion as well as the visualized portions of the brachial artery. Forward flow was then reassessed after the balloon was removed and found to be widely patent with rapid flow of contrast and preservation the central venous system.  A 4-0 Monocryl purse-string suture was sewn around both of the sheaths.  The sheaths were removed and light pressure was applied.  A sterile bandage was applied to the puncture site.    COMPLICATIONS: None  CONDITION: Kathleen Sharp, M.D [facility] Office: [phone]  04/12/2016 5:00 PM

## 2016-04-13 DIAGNOSIS — Z992 Dependence on renal dialysis: Secondary | ICD-10-CM | POA: Diagnosis not present

## 2016-04-13 DIAGNOSIS — N186 End stage renal disease: Secondary | ICD-10-CM | POA: Diagnosis not present

## 2016-04-13 DIAGNOSIS — Z23 Encounter for immunization: Secondary | ICD-10-CM | POA: Diagnosis not present

## 2016-04-15 ENCOUNTER — Encounter: Payer: Self-pay | Admitting: Vascular Surgery

## 2016-04-15 DIAGNOSIS — Z23 Encounter for immunization: Secondary | ICD-10-CM | POA: Diagnosis not present

## 2016-04-15 DIAGNOSIS — N186 End stage renal disease: Secondary | ICD-10-CM | POA: Diagnosis not present

## 2016-04-15 DIAGNOSIS — Z992 Dependence on renal dialysis: Secondary | ICD-10-CM | POA: Diagnosis not present

## 2016-04-17 DIAGNOSIS — Z992 Dependence on renal dialysis: Secondary | ICD-10-CM | POA: Diagnosis not present

## 2016-04-17 DIAGNOSIS — N186 End stage renal disease: Secondary | ICD-10-CM | POA: Diagnosis not present

## 2016-04-19 DIAGNOSIS — N186 End stage renal disease: Secondary | ICD-10-CM | POA: Diagnosis not present

## 2016-04-19 DIAGNOSIS — Z992 Dependence on renal dialysis: Secondary | ICD-10-CM | POA: Diagnosis not present

## 2016-04-22 DIAGNOSIS — N186 End stage renal disease: Secondary | ICD-10-CM | POA: Diagnosis not present

## 2016-04-22 DIAGNOSIS — Z992 Dependence on renal dialysis: Secondary | ICD-10-CM | POA: Diagnosis not present

## 2016-04-24 DIAGNOSIS — N186 End stage renal disease: Secondary | ICD-10-CM | POA: Diagnosis not present

## 2016-04-24 DIAGNOSIS — Z992 Dependence on renal dialysis: Secondary | ICD-10-CM | POA: Diagnosis not present

## 2016-04-26 DIAGNOSIS — Z992 Dependence on renal dialysis: Secondary | ICD-10-CM | POA: Diagnosis not present

## 2016-04-26 DIAGNOSIS — N186 End stage renal disease: Secondary | ICD-10-CM | POA: Diagnosis not present

## 2016-04-29 DIAGNOSIS — N186 End stage renal disease: Secondary | ICD-10-CM | POA: Diagnosis not present

## 2016-04-29 DIAGNOSIS — Z992 Dependence on renal dialysis: Secondary | ICD-10-CM | POA: Diagnosis not present

## 2016-04-30 DIAGNOSIS — I10 Essential (primary) hypertension: Secondary | ICD-10-CM | POA: Diagnosis not present

## 2016-04-30 DIAGNOSIS — Z992 Dependence on renal dialysis: Secondary | ICD-10-CM | POA: Diagnosis not present

## 2016-04-30 DIAGNOSIS — E119 Type 2 diabetes mellitus without complications: Secondary | ICD-10-CM | POA: Diagnosis not present

## 2016-04-30 DIAGNOSIS — N186 End stage renal disease: Secondary | ICD-10-CM | POA: Diagnosis not present

## 2016-04-30 DIAGNOSIS — E785 Hyperlipidemia, unspecified: Secondary | ICD-10-CM | POA: Diagnosis not present

## 2016-04-30 DIAGNOSIS — M189 Osteoarthritis of first carpometacarpal joint, unspecified: Secondary | ICD-10-CM | POA: Diagnosis not present

## 2016-04-30 DIAGNOSIS — E669 Obesity, unspecified: Secondary | ICD-10-CM | POA: Diagnosis not present

## 2016-04-30 DIAGNOSIS — I129 Hypertensive chronic kidney disease with stage 1 through stage 4 chronic kidney disease, or unspecified chronic kidney disease: Secondary | ICD-10-CM | POA: Diagnosis not present

## 2016-04-30 DIAGNOSIS — Z01810 Encounter for preprocedural cardiovascular examination: Secondary | ICD-10-CM | POA: Diagnosis not present

## 2016-05-01 DIAGNOSIS — N186 End stage renal disease: Secondary | ICD-10-CM | POA: Diagnosis not present

## 2016-05-01 DIAGNOSIS — Z992 Dependence on renal dialysis: Secondary | ICD-10-CM | POA: Diagnosis not present

## 2016-05-03 DIAGNOSIS — Z992 Dependence on renal dialysis: Secondary | ICD-10-CM | POA: Diagnosis not present

## 2016-05-03 DIAGNOSIS — N186 End stage renal disease: Secondary | ICD-10-CM | POA: Diagnosis not present

## 2016-05-06 DIAGNOSIS — Z992 Dependence on renal dialysis: Secondary | ICD-10-CM | POA: Diagnosis not present

## 2016-05-06 DIAGNOSIS — N186 End stage renal disease: Secondary | ICD-10-CM | POA: Diagnosis not present

## 2016-05-08 DIAGNOSIS — Z992 Dependence on renal dialysis: Secondary | ICD-10-CM | POA: Diagnosis not present

## 2016-05-08 DIAGNOSIS — N186 End stage renal disease: Secondary | ICD-10-CM | POA: Diagnosis not present

## 2016-05-10 DIAGNOSIS — Z992 Dependence on renal dialysis: Secondary | ICD-10-CM | POA: Diagnosis not present

## 2016-05-10 DIAGNOSIS — N186 End stage renal disease: Secondary | ICD-10-CM | POA: Diagnosis not present

## 2016-05-13 DIAGNOSIS — N186 End stage renal disease: Secondary | ICD-10-CM | POA: Diagnosis not present

## 2016-05-13 DIAGNOSIS — Z992 Dependence on renal dialysis: Secondary | ICD-10-CM | POA: Diagnosis not present

## 2016-05-15 DIAGNOSIS — Z992 Dependence on renal dialysis: Secondary | ICD-10-CM | POA: Diagnosis not present

## 2016-05-15 DIAGNOSIS — N186 End stage renal disease: Secondary | ICD-10-CM | POA: Diagnosis not present

## 2016-05-16 DIAGNOSIS — N186 End stage renal disease: Secondary | ICD-10-CM | POA: Diagnosis not present

## 2016-05-16 DIAGNOSIS — Z992 Dependence on renal dialysis: Secondary | ICD-10-CM | POA: Diagnosis not present

## 2016-05-20 DIAGNOSIS — Z992 Dependence on renal dialysis: Secondary | ICD-10-CM | POA: Diagnosis not present

## 2016-05-20 DIAGNOSIS — N186 End stage renal disease: Secondary | ICD-10-CM | POA: Diagnosis not present

## 2016-05-20 DIAGNOSIS — Z23 Encounter for immunization: Secondary | ICD-10-CM | POA: Diagnosis not present

## 2016-05-22 ENCOUNTER — Other Ambulatory Visit: Payer: Self-pay | Admitting: Vascular Surgery

## 2016-05-22 ENCOUNTER — Encounter
Admission: RE | Disposition: A | Discharge status: Home / Self Care | Payer: Self-pay | Source: Ambulatory Visit | Attending: Vascular Surgery

## 2016-05-22 ENCOUNTER — Ambulatory Visit
Admission: RE | Admit: 2016-05-22 | Discharge: 2016-05-22 | Disposition: A | Discharge status: Home / Self Care | Payer: Commercial Managed Care - HMO | Source: Ambulatory Visit | Attending: Vascular Surgery | Admitting: Vascular Surgery

## 2016-05-22 DIAGNOSIS — Z992 Dependence on renal dialysis: Secondary | ICD-10-CM | POA: Diagnosis not present

## 2016-05-22 DIAGNOSIS — Z90710 Acquired absence of both cervix and uterus: Secondary | ICD-10-CM | POA: Diagnosis not present

## 2016-05-22 DIAGNOSIS — N186 End stage renal disease: Secondary | ICD-10-CM | POA: Insufficient documentation

## 2016-05-22 DIAGNOSIS — M189 Osteoarthritis of first carpometacarpal joint, unspecified: Secondary | ICD-10-CM | POA: Diagnosis not present

## 2016-05-22 DIAGNOSIS — T82868A Thrombosis of vascular prosthetic devices, implants and grafts, initial encounter: Secondary | ICD-10-CM | POA: Diagnosis not present

## 2016-05-22 DIAGNOSIS — I10 Essential (primary) hypertension: Secondary | ICD-10-CM | POA: Diagnosis not present

## 2016-05-22 DIAGNOSIS — I120 Hypertensive chronic kidney disease with stage 5 chronic kidney disease or end stage renal disease: Secondary | ICD-10-CM | POA: Diagnosis not present

## 2016-05-22 DIAGNOSIS — Z01810 Encounter for preprocedural cardiovascular examination: Secondary | ICD-10-CM | POA: Diagnosis not present

## 2016-05-22 DIAGNOSIS — F17210 Nicotine dependence, cigarettes, uncomplicated: Secondary | ICD-10-CM | POA: Insufficient documentation

## 2016-05-22 DIAGNOSIS — E785 Hyperlipidemia, unspecified: Secondary | ICD-10-CM | POA: Diagnosis not present

## 2016-05-22 DIAGNOSIS — Y832 Surgical operation with anastomosis, bypass or graft as the cause of abnormal reaction of the patient, or of later complication, without mention of misadventure at the time of the procedure: Secondary | ICD-10-CM | POA: Insufficient documentation

## 2016-05-22 DIAGNOSIS — E669 Obesity, unspecified: Secondary | ICD-10-CM | POA: Diagnosis not present

## 2016-05-22 DIAGNOSIS — I129 Hypertensive chronic kidney disease with stage 1 through stage 4 chronic kidney disease, or unspecified chronic kidney disease: Secondary | ICD-10-CM | POA: Diagnosis not present

## 2016-05-22 DIAGNOSIS — Z841 Family history of disorders of kidney and ureter: Secondary | ICD-10-CM | POA: Diagnosis not present

## 2016-05-22 DIAGNOSIS — E119 Type 2 diabetes mellitus without complications: Secondary | ICD-10-CM | POA: Diagnosis not present

## 2016-05-22 HISTORY — PX: PERIPHERAL VASCULAR CATHETERIZATION: SHX172C

## 2016-05-22 LAB — POTASSIUM: Potassium: 4.8 mmol/L (ref 3.5–5.1)

## 2016-05-22 SURGERY — THROMBECTOMY
Anesthesia: Moderate Sedation

## 2016-05-22 MED ORDER — MIDAZOLAM HCL 2 MG/2ML IJ SOLN
INTRAMUSCULAR | Status: DC | PRN
  Administered 2016-05-22: 2 mg via INTRAVENOUS
  Administered 2016-05-22: 1 mg via INTRAVENOUS
  Administered 2016-05-22: 2 mg via INTRAVENOUS

## 2016-05-22 MED ORDER — ONDANSETRON HCL 4 MG/2ML IJ SOLN: 4.0000 mg | Freq: Four times a day (QID) | INTRAMUSCULAR | Status: DC | PRN

## 2016-05-22 MED ORDER — FENTANYL CITRATE (PF) 100 MCG/2ML IJ SOLN
INTRAMUSCULAR | Status: AC
  Filled 2016-05-22: qty 2

## 2016-05-22 MED ORDER — HEPARIN SODIUM (PORCINE) 1000 UNIT/ML IJ SOLN
INTRAMUSCULAR | Status: AC
  Filled 2016-05-22: qty 1

## 2016-05-22 MED ORDER — FAMOTIDINE 20 MG PO TABS: 40.0000 mg | ORAL_TABLET | ORAL | Status: DC | PRN

## 2016-05-22 MED ORDER — HYDROMORPHONE HCL 1 MG/ML IJ SOLN: 1.0000 mg | Freq: Once | INTRAMUSCULAR | Status: DC

## 2016-05-22 MED ORDER — HEPARIN SODIUM (PORCINE) 1000 UNIT/ML IJ SOLN
INTRAMUSCULAR | Status: DC | PRN
  Administered 2016-05-22: 4000 [IU] via INTRAVENOUS

## 2016-05-22 MED ORDER — MIDAZOLAM HCL 5 MG/5ML IJ SOLN
INTRAMUSCULAR | Status: AC
  Filled 2016-05-22: qty 5

## 2016-05-22 MED ORDER — HEPARIN (PORCINE) IN NACL 2-0.9 UNIT/ML-% IJ SOLN
INTRAMUSCULAR | Status: AC
Start: 2016-05-22 — End: 2016-05-22
  Filled 2016-05-22: qty 1000

## 2016-05-22 MED ORDER — SODIUM CHLORIDE 0.9 % IV SOLN
INTRAVENOUS | Status: DC
  Administered 2016-05-22: 14:00:00 via INTRAVENOUS

## 2016-05-22 MED ORDER — LIDOCAINE HCL (PF) 1 % IJ SOLN
INTRAMUSCULAR | Status: AC
  Filled 2016-05-22: qty 10

## 2016-05-22 MED ORDER — CEFUROXIME SODIUM 1.5 G IJ SOLR
1.5000 g | INTRAMUSCULAR | Status: AC
  Administered 2016-05-22: 1.5 g via INTRAVENOUS

## 2016-05-22 MED ORDER — METHYLPREDNISOLONE SODIUM SUCC 125 MG IJ SOLR: 125.0000 mg | INTRAMUSCULAR | Status: DC | PRN

## 2016-05-22 MED ORDER — IOPAMIDOL (ISOVUE-300) INJECTION 61%
INTRAVENOUS | Status: DC | PRN
  Administered 2016-05-22: 65 mL via INTRA_ARTERIAL

## 2016-05-22 MED ORDER — FENTANYL CITRATE (PF) 100 MCG/2ML IJ SOLN
INTRAMUSCULAR | Status: DC | PRN
  Administered 2016-05-22 (×3): 50 ug via INTRAVENOUS

## 2016-05-22 SURGICAL SUPPLY — 12 items
BALLN LUTONIX AV 8X60X75 (BALLOONS) ×4
BALLOON LUTONIX AV 8X60X75 (BALLOONS) ×2 IMPLANT
CATH KA2 5FR 65CM (CATHETERS) ×4 IMPLANT
CATH TORCON 5FR 0.38 (CATHETERS) ×4 IMPLANT
DEVICE PRESTO INFLATION (MISCELLANEOUS) ×4 IMPLANT
DRAPE BRACHIAL (DRAPES) ×4 IMPLANT
GUIDEWIRE ANGLED .035 180CM (WIRE) ×4 IMPLANT
KIT THROMB PERC PTD (MISCELLANEOUS) ×4 IMPLANT
PACK ANGIOGRAPHY (CUSTOM PROCEDURE TRAY) ×4 IMPLANT
SHEATH BRITE TIP 6FRX5.5 (SHEATH) ×8 IMPLANT
TOWEL OR 17X26 4PK STRL BLUE (TOWEL DISPOSABLE) ×4 IMPLANT
WIRE MAGIC TOR.035 180C (WIRE) ×4 IMPLANT

## 2016-05-22 NOTE — Op Note (Signed)
OPERATIVE NOTE   PROCEDURE: 1. Contrast injection left arm brachial axillary dialysis graft 2. Mechanical thrombectomy with Trerotola device left arm brachial axillary dialysis graft 3. Percutaneous transluminal angioplasty of the venous outflow left arm brachial axillary dialysis graft  PRE-OPERATIVE DIAGNOSIS: Complication of dialysis access                                                       End Stage Renal Disease  POST-OPERATIVE DIAGNOSIS: same as above   SURGEON: [name], M.D.  ANESTHESIA: Conscious Sedation   ESTIMATED BLOOD LOSS: minimal  FINDING(S): 1. Thrombus within the graft  SPECIMEN(S):  None  CONTRAST: 65 cc  FLUOROSCOPY TIME: 6.0 minutes  INDICATIONS: Kathleen Sharp is a 70 y.o. female who  presents with malfunctioning left arm brachial axillary AV access.  The patient is scheduled for angiography with possible intervention of the AV access.  The patient is aware the risks include but are not limited to: bleeding, infection, thrombosis of the cannulated access, and possible anaphylactic reaction to the contrast.  The patient acknowledges if the access can not be salvaged a tunneled catheter will be needed and will be placed during this procedure.  The patient is aware of the risks of the procedure and elects to proceed with the angiogram and intervention.  DESCRIPTION: After full informed written consent was obtained, the patient was brought back to the Special Procedure suite and placed supine position.  Appropriate cardiopulmonary monitors were placed.  The left arm was prepped and draped in the standard fashion.  Appropriate timeout is called. The left brachial axillary graft  was cannulated with a micropuncture needle using ultrasound. Ultrasound was placed in a sterile sleeve was used to evaluate the graft which was noted to have heterogeneous material consistent with thrombosis. Image was recorded for the permanent record. A microneedle was inserted  under direct ultrasound visualization. This process was repeated in its entirety for the second sheath as well. The microwire was advanced and the needle was exchanged for  a microsheath.  The J-wire was then advanced and a 6 Fr sheath inserted.  Hand was then performed which demonstrated thrombus within the AV access.  The central venous structures were also imaged by hand injections.  4000 units of heparin was given and allowed to circulate as well.  A Trerotola device was then advanced beginning centrally and pulling back performing.  Several passes were made through the venous portion of the graft. Follow-up imaging now demonstrates the vast majority of the clot had been treated. Therefore a retrograde sheath was inserted. This too was a 6 [place] sheath was positioned more proximally on the arm and angled in the retrograde direction. Subsequently a floppy Glidewire and a KMP catheter were negotiated into the arterial system hand injection contrast was then utilized to demonstrate patency of the artery as well as the location for the anastomosis. The Trerotola device was now advanced through the retrograde sheath was extended out into the artery the basket was opened and it was slowly pulled back into the graft and then the basket was engaged. Several passes were made on the arterial portion and pulsatility of the access was reestablished. Follow-up imaging demonstrates there was now thrombus in the venous portion surrounding the sheath and this was treated with the Trerotola device from the antegrade  direction. After several passes imaging demonstrated resolution of thrombus within the graft and forward flow however stricture of the graft was noted area  Magic torque wire was then advanced through the antegrade sheath and an 8 x 6 Lutonix balloon was used to angioplasty the venous portion of the AV access. 2 inflations were performed each to 12 atm for 1 minute.  With the balloon inflated reflux of  contrast was performed demonstrating the arterial anastomosis. The arterial anastomosis was widely patent. Contrast was then injected in the forward direction demonstrating rapid flow.  A 4-0 Monocryl purse-string suture was sewn around both of the sheaths.  The sheaths were removed and light pressure was applied.  A sterile bandage was applied to the puncture site.    COMPLICATIONS: None  CONDITION: Improved  [name], M.D [facility] Office: [phone]  05/22/2016 6:11 PM

## 2016-05-22 NOTE — Discharge Instructions (Signed)
The drugs you were given will stay in your system until tomorrow, so for the next 24 hours you should not.  Drive an automobile. Make any legal decisions.  Drink any alcoholic beverages.  Today you should start with liquids and gradually work up to solid foods as your are able to tolerate them  Resume your regular medications as prescribed by your doctor.  Avoid aspirin for 24 hours.    Change the Band-Aid or dressing as needed.  After a 2 days no dressing as needed.  Avoid strenuous activity for the remainder of the day.  Please notify your primary physician immediately if you have any unusual bleeding, trouble breathing, fever >100 degrees or pain not relieved by the medication your doctor prescribed for your doctor prescribed for you physician  Return to diaslysis  tommorow.The drugs you were given will stay in your system until tomorrow, so for the next 24 hours you should not.  Drive an automobile. Make any legal decisions.  Drink any alcoholic beverages.  Today you should start with liquids and gradually work up to solid foods as your are able to tolerate them  Resume your regular medications as prescribed by your doctor.  Avoid aspirin for 24 hours.    Change the Band-Aid or dressing as needed.  After a 2 days no dressing as needed.  Avoid strenuous activity for the remainder of the day.  Please notify your primary physician immediately if you have any unusual bleeding, trouble breathing, fever >100 degrees or pain not relieved by the medication your doctor prescribed for your doctor prescribed for you physician  Return to diaslysis  tommorow.Ventriculoperitoneal fistulagram, Care After Refer to this sheet in the next few weeks. These instructions provide you with information about caring for yourself after your procedure. Your health care provider may also give you more specific instructions. Your treatment has been planned according to current medical practices, but problems  sometimes occur. Call your health care provider if you have any problems or questions after your procedure. WHAT TO EXPECT AFTER THE PROCEDURE After your procedure, it is typical to have the following:  Swelling in the ventriculoperitoneal (VP) shunt placement area.  Soreness near your scalp or abdominal incision.  Soreness in your neck and chest on the side of your VP shunt. HOME CARE INSTRUCTIONS  Take medicines only as directed by your health care provider.  Do not take baths, swim, or use a hot tub until your health care provider approves.  Rest often. Your body needs time to adjust to the VP shunt.  If you have a programmable shunt and need an MRI, it is very important to see your surgeon to have your shunt reprogrammed before the procedure. Many programmable shunts are sensitive to magnets, which are involved in MRIs.  There are many different ways to close and cover an incision, including stitches (sutures), skin glue, and adhesive strips. Follow your health care provider's instructions on:  Incision care.  Bandage (dressing) changes and removal.  Incision closure removal.  Keep all follow-up visits as directed by your health care provider. This is important. SEEK MEDICAL CARE IF:  You have a poor appetite.  You have low energy.  You feel restless, confused, or irritable. SEEK IMMEDIATE MEDICAL CARE IF:  You have drainage from an incision site.  An incision site starts to get red, warm, swollen, or tender.   An incision site opens up.  You have any signs or symptoms of shunt malfunction. These include:  Headaches.  Nausea and vomiting.  Fever.  Swelling along the VP shunt path.  Vision changes.  You cannot control your bladder.  You have a seizure.  You have trouble walking.   This information is not intended to replace advice given to you by your health care provider. Make sure you discuss any questions you have with your health care provider.     Document Released: 01/29/2011 Document Revised: 09/23/2014 Document Reviewed: 02/09/2014 Elsevier Interactive Patient Education [employer].

## 2016-05-23 ENCOUNTER — Encounter: Payer: Self-pay | Admitting: Vascular Surgery

## 2016-05-24 DIAGNOSIS — Z992 Dependence on renal dialysis: Secondary | ICD-10-CM | POA: Diagnosis not present

## 2016-05-24 DIAGNOSIS — Z23 Encounter for immunization: Secondary | ICD-10-CM | POA: Diagnosis not present

## 2016-05-24 DIAGNOSIS — N186 End stage renal disease: Secondary | ICD-10-CM | POA: Diagnosis not present

## 2016-05-26 DIAGNOSIS — Z23 Encounter for immunization: Secondary | ICD-10-CM | POA: Diagnosis not present

## 2016-05-26 DIAGNOSIS — Z992 Dependence on renal dialysis: Secondary | ICD-10-CM | POA: Diagnosis not present

## 2016-05-26 DIAGNOSIS — N186 End stage renal disease: Secondary | ICD-10-CM | POA: Diagnosis not present

## 2016-05-29 DIAGNOSIS — Z23 Encounter for immunization: Secondary | ICD-10-CM | POA: Diagnosis not present

## 2016-05-29 DIAGNOSIS — Z992 Dependence on renal dialysis: Secondary | ICD-10-CM | POA: Diagnosis not present

## 2016-05-29 DIAGNOSIS — N186 End stage renal disease: Secondary | ICD-10-CM | POA: Diagnosis not present

## 2016-05-31 DIAGNOSIS — Z992 Dependence on renal dialysis: Secondary | ICD-10-CM | POA: Diagnosis not present

## 2016-05-31 DIAGNOSIS — Z23 Encounter for immunization: Secondary | ICD-10-CM | POA: Diagnosis not present

## 2016-05-31 DIAGNOSIS — N186 End stage renal disease: Secondary | ICD-10-CM | POA: Diagnosis not present

## 2016-06-03 DIAGNOSIS — Z992 Dependence on renal dialysis: Secondary | ICD-10-CM | POA: Diagnosis not present

## 2016-06-03 DIAGNOSIS — N186 End stage renal disease: Secondary | ICD-10-CM | POA: Diagnosis not present

## 2016-06-03 DIAGNOSIS — Z23 Encounter for immunization: Secondary | ICD-10-CM | POA: Diagnosis not present

## 2016-06-05 DIAGNOSIS — Z23 Encounter for immunization: Secondary | ICD-10-CM | POA: Diagnosis not present

## 2016-06-05 DIAGNOSIS — N186 End stage renal disease: Secondary | ICD-10-CM | POA: Diagnosis not present

## 2016-06-05 DIAGNOSIS — Z992 Dependence on renal dialysis: Secondary | ICD-10-CM | POA: Diagnosis not present

## 2016-06-07 DIAGNOSIS — Z23 Encounter for immunization: Secondary | ICD-10-CM | POA: Diagnosis not present

## 2016-06-07 DIAGNOSIS — N186 End stage renal disease: Secondary | ICD-10-CM | POA: Diagnosis not present

## 2016-06-07 DIAGNOSIS — Z992 Dependence on renal dialysis: Secondary | ICD-10-CM | POA: Diagnosis not present

## 2016-06-10 DIAGNOSIS — N186 End stage renal disease: Secondary | ICD-10-CM | POA: Diagnosis not present

## 2016-06-10 DIAGNOSIS — Z992 Dependence on renal dialysis: Secondary | ICD-10-CM | POA: Diagnosis not present

## 2016-06-10 DIAGNOSIS — Z23 Encounter for immunization: Secondary | ICD-10-CM | POA: Diagnosis not present

## 2016-06-12 DIAGNOSIS — N186 End stage renal disease: Secondary | ICD-10-CM | POA: Diagnosis not present

## 2016-06-12 DIAGNOSIS — Z23 Encounter for immunization: Secondary | ICD-10-CM | POA: Diagnosis not present

## 2016-06-12 DIAGNOSIS — Z992 Dependence on renal dialysis: Secondary | ICD-10-CM | POA: Diagnosis not present

## 2016-06-13 DIAGNOSIS — Q2546 Tortuous aortic arch: Secondary | ICD-10-CM | POA: Diagnosis not present

## 2016-06-13 DIAGNOSIS — I10 Essential (primary) hypertension: Secondary | ICD-10-CM | POA: Diagnosis not present

## 2016-06-13 DIAGNOSIS — R918 Other nonspecific abnormal finding of lung field: Secondary | ICD-10-CM | POA: Diagnosis not present

## 2016-06-13 DIAGNOSIS — Z7682 Awaiting organ transplant status: Secondary | ICD-10-CM | POA: Diagnosis not present

## 2016-06-13 DIAGNOSIS — N185 Chronic kidney disease, stage 5: Secondary | ICD-10-CM | POA: Diagnosis not present

## 2016-06-13 DIAGNOSIS — Z992 Dependence on renal dialysis: Secondary | ICD-10-CM | POA: Diagnosis not present

## 2016-06-13 DIAGNOSIS — I120 Hypertensive chronic kidney disease with stage 5 chronic kidney disease or end stage renal disease: Secondary | ICD-10-CM | POA: Diagnosis not present

## 2016-06-13 DIAGNOSIS — N186 End stage renal disease: Secondary | ICD-10-CM | POA: Diagnosis not present

## 2016-06-13 DIAGNOSIS — E7800 Pure hypercholesterolemia, unspecified: Secondary | ICD-10-CM | POA: Diagnosis not present

## 2016-06-13 DIAGNOSIS — Z114 Encounter for screening for human immunodeficiency virus [HIV]: Secondary | ICD-10-CM | POA: Diagnosis not present

## 2016-06-13 DIAGNOSIS — D631 Anemia in chronic kidney disease: Secondary | ICD-10-CM | POA: Diagnosis not present

## 2016-06-14 DIAGNOSIS — Z23 Encounter for immunization: Secondary | ICD-10-CM | POA: Diagnosis not present

## 2016-06-14 DIAGNOSIS — N186 End stage renal disease: Secondary | ICD-10-CM | POA: Diagnosis not present

## 2016-06-14 DIAGNOSIS — Z992 Dependence on renal dialysis: Secondary | ICD-10-CM | POA: Diagnosis not present

## 2016-06-15 DIAGNOSIS — Z992 Dependence on renal dialysis: Secondary | ICD-10-CM | POA: Diagnosis not present

## 2016-06-15 DIAGNOSIS — N186 End stage renal disease: Secondary | ICD-10-CM | POA: Diagnosis not present

## 2016-06-17 DIAGNOSIS — Z23 Encounter for immunization: Secondary | ICD-10-CM | POA: Diagnosis not present

## 2016-06-17 DIAGNOSIS — N186 End stage renal disease: Secondary | ICD-10-CM | POA: Diagnosis not present

## 2016-06-17 DIAGNOSIS — Z992 Dependence on renal dialysis: Secondary | ICD-10-CM | POA: Diagnosis not present

## 2016-06-19 DIAGNOSIS — N186 End stage renal disease: Secondary | ICD-10-CM | POA: Diagnosis not present

## 2016-06-19 DIAGNOSIS — Z23 Encounter for immunization: Secondary | ICD-10-CM | POA: Diagnosis not present

## 2016-06-19 DIAGNOSIS — Z992 Dependence on renal dialysis: Secondary | ICD-10-CM | POA: Diagnosis not present

## 2016-06-20 ENCOUNTER — Telehealth (INDEPENDENT_AMBULATORY_CARE_PROVIDER_SITE_OTHER): Payer: Self-pay

## 2016-06-20 NOTE — Telephone Encounter (Signed)
Left arm declot graft / ESRD , NKDA - 06/21/16 for [employer]

## 2016-06-21 ENCOUNTER — Encounter
Admission: RE | Disposition: A | Discharge status: Home / Self Care | Payer: Self-pay | Source: Ambulatory Visit | Attending: Vascular Surgery

## 2016-06-21 ENCOUNTER — Ambulatory Visit
Admission: RE | Admit: 2016-06-21 | Discharge: 2016-06-21 | Disposition: A | Discharge status: Home / Self Care | Payer: Commercial Managed Care - HMO | Source: Ambulatory Visit | Attending: Vascular Surgery | Admitting: Vascular Surgery

## 2016-06-21 DIAGNOSIS — Z992 Dependence on renal dialysis: Secondary | ICD-10-CM | POA: Insufficient documentation

## 2016-06-21 DIAGNOSIS — Y832 Surgical operation with anastomosis, bypass or graft as the cause of abnormal reaction of the patient, or of later complication, without mention of misadventure at the time of the procedure: Secondary | ICD-10-CM | POA: Diagnosis not present

## 2016-06-21 DIAGNOSIS — E1122 Type 2 diabetes mellitus with diabetic chronic kidney disease: Secondary | ICD-10-CM | POA: Insufficient documentation

## 2016-06-21 DIAGNOSIS — Z8249 Family history of ischemic heart disease and other diseases of the circulatory system: Secondary | ICD-10-CM | POA: Insufficient documentation

## 2016-06-21 DIAGNOSIS — I120 Hypertensive chronic kidney disease with stage 5 chronic kidney disease or end stage renal disease: Secondary | ICD-10-CM | POA: Diagnosis not present

## 2016-06-21 DIAGNOSIS — T82858A Stenosis of vascular prosthetic devices, implants and grafts, initial encounter: Secondary | ICD-10-CM | POA: Diagnosis not present

## 2016-06-21 DIAGNOSIS — E119 Type 2 diabetes mellitus without complications: Secondary | ICD-10-CM | POA: Diagnosis not present

## 2016-06-21 DIAGNOSIS — D649 Anemia, unspecified: Secondary | ICD-10-CM | POA: Diagnosis not present

## 2016-06-21 DIAGNOSIS — Z87891 Personal history of nicotine dependence: Secondary | ICD-10-CM | POA: Insufficient documentation

## 2016-06-21 DIAGNOSIS — I2510 Atherosclerotic heart disease of native coronary artery without angina pectoris: Secondary | ICD-10-CM | POA: Diagnosis not present

## 2016-06-21 DIAGNOSIS — E7800 Pure hypercholesterolemia, unspecified: Secondary | ICD-10-CM | POA: Diagnosis not present

## 2016-06-21 DIAGNOSIS — T82868A Thrombosis of vascular prosthetic devices, implants and grafts, initial encounter: Secondary | ICD-10-CM | POA: Insufficient documentation

## 2016-06-21 DIAGNOSIS — N186 End stage renal disease: Secondary | ICD-10-CM | POA: Diagnosis not present

## 2016-06-21 DIAGNOSIS — M1990 Unspecified osteoarthritis, unspecified site: Secondary | ICD-10-CM | POA: Diagnosis not present

## 2016-06-21 DIAGNOSIS — Z841 Family history of disorders of kidney and ureter: Secondary | ICD-10-CM | POA: Insufficient documentation

## 2016-06-21 DIAGNOSIS — Z90710 Acquired absence of both cervix and uterus: Secondary | ICD-10-CM | POA: Insufficient documentation

## 2016-06-21 HISTORY — PX: PERIPHERAL VASCULAR CATHETERIZATION: SHX172C

## 2016-06-21 LAB — POTASSIUM (ARMC VASCULAR LAB ONLY): Potassium (ARMC vascular lab): 4.3 (ref 3.5–5.1)

## 2016-06-21 SURGERY — THROMBECTOMY
Anesthesia: Moderate Sedation | Site: Arm Upper | Laterality: Left

## 2016-06-21 MED ORDER — FENTANYL CITRATE (PF) 100 MCG/2ML IJ SOLN
INTRAMUSCULAR | Status: DC | PRN
  Administered 2016-06-21 (×2): 50 ug via INTRAVENOUS

## 2016-06-21 MED ORDER — CEFUROXIME SODIUM 1.5 G IJ SOLR: 1.5000 g | Freq: Three times a day (TID) | INTRAMUSCULAR | Status: DC

## 2016-06-21 MED ORDER — DEXTROSE 5 % IV SOLN
INTRAVENOUS | Status: AC
  Filled 2016-06-21: qty 1.5

## 2016-06-21 MED ORDER — LIDOCAINE HCL (PF) 1 % IJ SOLN
INTRAMUSCULAR | Status: AC
  Filled 2016-06-21: qty 30

## 2016-06-21 MED ORDER — HEPARIN SODIUM (PORCINE) 1000 UNIT/ML IJ SOLN
INTRAMUSCULAR | Status: AC
  Filled 2016-06-21: qty 1

## 2016-06-21 MED ORDER — HEPARIN SODIUM (PORCINE) 1000 UNIT/ML IJ SOLN
INTRAMUSCULAR | Status: DC | PRN
  Administered 2016-06-21: 4000 [IU] via INTRAVENOUS

## 2016-06-21 MED ORDER — CEFUROXIME SODIUM 1.5 G IJ SOLR
1.5000 g | Freq: Once | INTRAMUSCULAR | Status: AC
  Administered 2016-06-21: 1.5 g via INTRAVENOUS

## 2016-06-21 MED ORDER — FENTANYL CITRATE (PF) 100 MCG/2ML IJ SOLN
INTRAMUSCULAR | Status: AC
  Filled 2016-06-21: qty 2

## 2016-06-21 MED ORDER — MIDAZOLAM HCL 2 MG/2ML IJ SOLN
INTRAMUSCULAR | Status: DC | PRN
  Administered 2016-06-21: 1 mg via INTRAVENOUS
  Administered 2016-06-21: 2 mg via INTRAVENOUS
  Administered 2016-06-21: 1 mg via INTRAVENOUS

## 2016-06-21 MED ORDER — MIDAZOLAM HCL 2 MG/2ML IJ SOLN
INTRAMUSCULAR | Status: AC
  Filled 2016-06-21: qty 4

## 2016-06-21 SURGICAL SUPPLY — 16 items
BALLN DORADO 9X40X80 (BALLOONS) ×3
BALLOON DORADO 9X40X80 (BALLOONS) ×1 IMPLANT
COVER PROBE U/S 5X48 (MISCELLANEOUS) ×3 IMPLANT
DEVICE PRESTO INFLATION (MISCELLANEOUS) ×3 IMPLANT
DRAPE BRACHIAL (DRAPES) ×3 IMPLANT
GRAFT STENT FLAIR ENDOVAS 9X50 (Permanent Stent) ×3 IMPLANT
KIT THROMB PERC PTD (MISCELLANEOUS) ×3 IMPLANT
PACK ANGIOGRAPHY (CUSTOM PROCEDURE TRAY) ×3 IMPLANT
SHEATH BRITE TIP 6FRX5.5 (SHEATH) ×6 IMPLANT
SHEATH BRITE TIP 7FRX5.5 (SHEATH) ×3 IMPLANT
SHIELD RADPAD DADD DRAPE 4X9 (MISCELLANEOUS) ×3 IMPLANT
SUT MNCRL 4-0 (SUTURE) ×2
SUT MNCRL 4-0 27XMFL (SUTURE) ×1
SUTURE MNCRL 4-0 27XMF (SUTURE) ×1 IMPLANT
TOWEL OR 17X26 4PK STRL BLUE (TOWEL DISPOSABLE) ×3 IMPLANT
WIRE MAGIC TORQUE 260C (WIRE) ×3 IMPLANT

## 2016-06-21 NOTE — Op Note (Signed)
OPERATIVE NOTE   PROCEDURE: 1. Contrast injection left brachial axillary dialysis graft 2. Mechanical thrombectomy left brachial axillary dialysis graft with Trerotola device 3. Percutaneous transluminal angioplasty and stent placement venous outflow left arm brachial axillary dialysis graft  PRE-OPERATIVE DIAGNOSIS: Complication of dialysis access                                                       End Stage Renal Disease  POST-OPERATIVE DIAGNOSIS: same as above   SURGEON: [name], M.D.  ANESTHESIA: Conscious Sedation   ESTIMATED BLOOD LOSS: minimal  FINDING(S): 1. Thrombus within the graft and associated narrowing of the vein at the proximal margin of the previously placed stent  SPECIMEN(S):  None  CONTRAST:  35 cc  FLUOROSCOPY TIME: 3.2 minutes  INDICATIONS: Kathleen Sharp is a 70 y.o. female who  presents with malfunctioning left arm AV access.  The patient is scheduled for angiography with possible intervention of the AV access.  The patient is aware the risks include but are not limited to: bleeding, infection, thrombosis of the cannulated access, and possible anaphylactic reaction to the contrast.  The patient acknowledges if the access can not be salvaged a tunneled catheter will be needed and will be placed during this procedure.  The patient is aware of the risks of the procedure and elects to proceed with the angiogram and intervention.  DESCRIPTION: After full informed written consent was obtained, the patient was brought back to the Special Procedure suite and placed supine position.  Appropriate cardiopulmonary monitors were placed.  The left arm was prepped and draped in the standard fashion.  Appropriate timeout is called. The left brachial axillary graft  was cannulated with a micropuncture needle under ultrasound guidance. Ultrasound was placed in a sterile sleeve and used to evaluate the graft which was noted to have a cold genic material within it  consistent with thrombosis. Images recorded for the permanent record and the microneedle was advanced under direct ultrasound visualization.  The microwire was advanced and the needle was exchanged for  a microsheath.  The J-wire was then advanced and a 6 Fr sheath inserted.  Hand was then performed which demonstrated thrombus within the AV access.  The central venous structures were also imaged by hand injections.  4000 units of heparin was given and allowed to circulate as well.  Trerotola device was then advanced beginning centrally and pulling back performing.  Several passes were made through the venous portion of the graft. Follow-up imaging now demonstrates the vast majority of the clot had been treated. Therefore a retrograde sheath was inserted. This too was a 6 [place] sheath was positioned more proximally on the arm and angled in the retrograde direction. The Trerotola device was now advanced through the retrograde sheath was extended out into the artery the basket was opened and it was slowly pulled back into the graft and then the basket was engaged. Several passes were made on the arterial portion and pulsatility of the access was reestablished. Follow-up imaging demonstrates there was now thrombus in the venous portion surrounding the sheath and this was treated with the Trerotola device from the antegrade direction. After several passes imaging demonstrated resolution of thrombus within the graft and forward flow however stricture of the graft was noted area  Magic torque wire was then  advanced through the antegrade sheath and a 9 x 50 flared flare stent is deployed across the venous outflow and postdilated with a 9 x 4 [place] balloon. Multiple inflations were performed inflation times with 30 seconds to 1 minute with maximum pressures of 16 ATM.  With the balloon inflated reflux of contrast was performed demonstrating the arterial anastomosis. The arterial anastomosis was widely patent.  Contrast was then injected in the forward direction demonstrating rapid flow.  A 4-0 Monocryl purse-string suture was sewn around both of the sheaths.  The sheaths were removed and light pressure was applied.  A sterile bandage was applied to the puncture site.    COMPLICATIONS: None  CONDITION: Kathleen Sharp, M.D [facility] Office: [phone]  06/21/2016 11:06 AM

## 2016-06-21 NOTE — H&P (Signed)
[facility] Vascular Consult Note  MRN : [id number]  Kathleen Sharp is a 70 y.o. (July 21, 1946) female who presents with chief complaint of clotted left arm AV access.  History of Present Illness: The patient is sent by the dialysis center after they identified her left arm brachial axillary graft had thrombosed. At this time she is missed 1 dialysis run. She denies fever chills. She denies arm pain or pain and tenderness at the access site.  No recent hospitalizations or changes in her overall health Status.  No current facility-administered medications for this encounter.    Facility-Administered Medications Ordered in Other Encounters  Medication Dose Route Frequency Provider Last Rate Last Dose  . epoetin alfa (EPOGEN,PROCRIT) injection 10,000 Units  10,000 Units Subcutaneous Once Lequita Asal, MD        Past Medical History:  Diagnosis Date  . Anemia   . Arthritis   . Chronic kidney disease    Chronic Kidney Disease  . Hypercholesterolemia   . Hypertension     Past Surgical History:  Procedure Laterality Date  . ABDOMINAL HYSTERECTOMY    . AV FISTULA PLACEMENT Left 12/15/2015   Procedure: INSERTION OF ARTERIOVENOUS (AV) GORE-TEX GRAFT ARM             ( BRACHIAL AXILLARY GRAFT );  Surgeon: [name], MD;  Location: [facility];  Service: Vascular;  Laterality: Left;  . CAPD INSERTION N/A 11/24/2015   Procedure: Exploratory laparoscopy;  Surgeon: [name], MD;  Location: [facility];  Service: Vascular;  Laterality: N/A;, attempted insertion but unable due to scar tissue  . PERIPHERAL VASCULAR CATHETERIZATION Left 02/27/2016   Procedure: Thrombectomy;  Surgeon: [name], MD;  Location: [place] [facility];  Service: Cardiovascular;  Laterality: Left;  . PERIPHERAL VASCULAR CATHETERIZATION Left 04/12/2016   Procedure: Thrombectomy;  Surgeon: [name], MD;  Location: [place] [facility];  Service: Cardiovascular;   Laterality: Left;  . PERIPHERAL VASCULAR CATHETERIZATION Left 05/22/2016   Procedure: Thrombectomy;  Surgeon: [name], MD;  Location: [place] [facility];  Service: Cardiovascular;  Laterality: Left;  . PERIPHERAL VASCULAR CATHETERIZATION N/A 05/22/2016   Procedure: A/V Shuntogram/Fistulagram;  Surgeon: [name], MD;  Location: [place] [facility];  Service: Cardiovascular;  Laterality: N/A;    Social History Social History  Substance Use Topics  . Smoking status: Former Smoker    Packs/day: 1.00    Years: 15.00    Types: Cigarettes    Quit date: 09/16/2012  . Smokeless tobacco: Never Used  . Alcohol use No    Family History Family History  Problem Relation Age of Onset  . Kidney disease Father   . Heart disease Mother   . Kidney disease Brother   No family history of bleeding clotting disorders, porphyria or autoimmune disease  No Known Allergies   REVIEW OF SYSTEMS (Negative unless checked)  Constitutional: $RemoveBeforeDE'[]'WgvtUCohejghYKY$ Weight loss  $Rem'[]'ENjb$ Fever  $Remo'[]'bFytI$ Chills Cardiac: $RemoveBeforeD'[]'AfQtDoUKapsOuD$ Chest pain   '[]'$ Chest pressure   '[]'$ Palpitations   '[]'$ Shortness of breath when laying flat   '[]'$ Shortness of breath at rest   '[]'$ Shortness of breath with exertion. Vascular:  $RemoveBe'[]'CuAKxDcLE$ Pain in legs with walking   '[]'$ Pain in legs at rest   '[]'$ Pain in legs when laying flat   '[]'$ Claudication   '[]'$ Pain in feet when walking  $Remove'[]'cXVlSXc$ Pain in feet at rest  $Rem'[]'GWol$ Pain in feet when laying flat   '[]'$ History of DVT   '[]'$ Phlebitis   '[x]'$ Swelling in legs   '[]'$   Varicose veins   [] Non-healing ulcers Pulmonary:   [] Uses home oxygen   [] Productive cough   [] Hemoptysis   [] Wheeze  [] COPD   [] Asthma Neurologic:  [] Dizziness  [] Blackouts   [] Seizures   [] History of stroke   [] History of TIA  [] Aphasia   [] Temporary blindness   [] Dysphagia   [] Weakness or numbness in arms   [] Weakness or numbness in legs Musculoskeletal:  [] Arthritis   [] Joint swelling   [] Joint pain   [] Low back pain Hematologic:  [] Easy bruising  [] Easy bleeding   [] Hypercoagulable state   [] Anemic   [] Hepatitis Gastrointestinal:  [] Blood in stool   [] Vomiting blood  [] Gastroesophageal reflux/heartburn   [] Difficulty swallowing. Genitourinary:  [x] Chronic kidney disease   [] Difficult urination  [] Frequent urination  [] Burning with urination   [] Blood in urine Skin:  [] Rashes   [] Ulcers   [] Wounds Psychological:  [] History of anxiety   []  History of major depression.  Physical Examination  Vitals:   06/21/16 0808  BP: (!) 140/118  Pulse: 78  Resp: 18  Temp: 98 F (36.7 C)  TempSrc: Oral  SpO2: 99%  Weight: 180 lb (81.6 kg)  Height: 5\' 3"  (1.6 m)   Body mass index is 31.89 kg/m. Gen:  WD/WN, NAD Head: [place]/AT, No temporalis wasting. Prominent temp pulse not noted. Ear/Nose/Throat: Hearing grossly intact, nares w/o erythema or drainage, oropharynx w/o Erythema/Exudate Eyes: PERRLA, EOMI.  Neck: Supple, no nuchal rigidity.  No bruit or JVD.  Pulmonary:  Good air movement, clear to auscultation bilaterally.  Cardiac: RRR, normal S1, S2, no Murmurs, rubs or gallops. Vascular: Left arm brachial axillary dialysis graft no thrill no bruit. There is no erythema or swelling or tenderness of the graft. No ulceration or sores overlying the graft. Vessel Right Left  Radial 1+ Palpable Trace Palpable  Ulnar Not Palpable Not Palpable  Brachial Palpable Palpable  Gastrointestinal: soft, non-tender/non-distended. No guarding/reflex. No masses, surgical incisions, or scars. Musculoskeletal: M/S 5/5 throughout.  Extremities without ischemic changes.  No deformity or atrophy. No edema. Neurologic: CN 2-12 intact. Pain and light touch intact in extremities.  Symmetrical.  Speech is fluent. Motor exam as listed above. Psychiatric: Judgment intact, Mood & affect appropriate for pt's clinical situation. Dermatologic: No rashes or ulcers noted.  No cellulitis or open wounds. Lymph : No Cervical, Axillary, or Inguinal lymphadenopathy.   CBC Lab Results  Component Value Date   WBC 9.3 12/07/2015    HGB 8.9 (L) 01/10/2016   HCT 33.0 (L) 12/15/2015   MCV 75.7 (L) 12/07/2015   PLT 372 12/07/2015    BMET    Component Value Date/Time   NA 139 12/15/2015 0845   K 4.8 05/22/2016 1444   CL 104 12/07/2015 1133   CO2 24 12/07/2015 1133   GLUCOSE 91 12/15/2015 0845   BUN 58 (H) 12/07/2015 1133   CREATININE 5.60 (H) 12/07/2015 1133   CALCIUM 9.4 12/07/2015 1133   GFRNONAA 7 (L) 12/07/2015 1133   GFRAA 8 (L) 12/07/2015 1133   CrCl cannot be calculated (Patient's most recent lab result is older than the maximum 21 days allowed.).  COAG Lab Results  Component Value Date   INR 1.18 12/07/2015   INR 1.05 11/14/2015    Radiology No results found.    Assessment/Plan 1.  Complication dialysis device with thrombosis AV access:  Patient's left arm dialysis access is thrombosed. The patient will undergo thrombectomy using interventional techniques. Potassium will be drawn to ensure that it is an appropriate level prior to performing  thrombectomy. 2.  End-stage renal disease requiring hemodialysis:  Patient will continue dialysis therapy without further interruption if a successful thrombectomy is not achieved then catheter will be placed. Dialysis has already been arranged since the patient missed their previous session 3.  Hypertension:  Patient will continue medical management; nephrology is following no changes in oral medications. 4. Diabetes mellitus:  Glucose will be monitored and oral medications been held this morning once the patient has undergone the patient's procedure po intake will be reinitiated and again Accu-Cheks will be used to assess the blood glucose level and treat as needed. The patient will be restarted on the patient's usual hypoglycemic regime 5.  Coronary artery disease:  EKG will be monitored. Nitrates will be used if needed. The patient's oral cardiac medications will be continued.      Hortencia Pilar, MD  06/21/2016 9:12 AM    This note was created  with Dragon medical transcription system.  Any error is purely unintentional

## 2016-06-21 NOTE — Discharge Instructions (Signed)
Fistulogram, Care After °Refer to this sheet in the next few weeks. These instructions provide you with information on caring for yourself after your procedure. Your health care provider may also give you more specific instructions. Your treatment has been planned according to current medical practices, but problems sometimes occur. Call your health care provider if you have any problems or questions after your procedure. °WHAT TO EXPECT AFTER THE PROCEDURE °After your procedure, it is typical to have the following: °· A small amount of discomfort in the area where the catheters were placed. °· A small amount of bruising around the fistula. °· Sleepiness and fatigue. °HOME CARE INSTRUCTIONS °· Rest at home for the day following your procedure. °· Do not drive or operate heavy machinery while taking pain medicine. °· Take medicines only as directed by your health care provider. °· Do not take baths, swim, or use a hot tub until your health care provider approves. You may shower 24 hours after the procedure or as directed by your health care provider. °· There are many different ways to close and cover an incision, including stitches, skin glue, and adhesive strips. Follow your health care provider's instructions on: °¨ Incision care. °¨ Bandage (dressing) changes and removal. °¨ Incision closure removal. °· Monitor your dialysis fistula carefully. °SEEK MEDICAL CARE IF: °· You have drainage, redness, swelling, or pain at your catheter site. °· You have a fever. °· You have chills. °SEEK IMMEDIATE MEDICAL CARE IF: °· You feel weak. °· You have trouble balancing. °· You have trouble moving your arms or legs. °· You have problems with your speech or vision. °· You can no longer feel a vibration or buzz when you put your fingers over your dialysis fistula. °· The limb that was used for the procedure: °¨ Swells. °¨ Is painful. °¨ Is cold. °¨ Is discolored, such as blue or pale white. °  °This information is not intended  to replace advice given to you by your health care provider. Make sure you discuss any questions you have with your health care provider. °  °Document Released: 01/17/2014 Document Reviewed: 01/17/2014 °Elsevier Interactive Patient Education ©2016 [employer] ° °

## 2016-06-22 DIAGNOSIS — N186 End stage renal disease: Secondary | ICD-10-CM | POA: Diagnosis not present

## 2016-06-22 DIAGNOSIS — Z23 Encounter for immunization: Secondary | ICD-10-CM | POA: Diagnosis not present

## 2016-06-22 DIAGNOSIS — Z992 Dependence on renal dialysis: Secondary | ICD-10-CM | POA: Diagnosis not present

## 2016-06-24 ENCOUNTER — Encounter: Payer: Self-pay | Admitting: Vascular Surgery

## 2016-06-24 DIAGNOSIS — Z23 Encounter for immunization: Secondary | ICD-10-CM | POA: Diagnosis not present

## 2016-06-24 DIAGNOSIS — N186 End stage renal disease: Secondary | ICD-10-CM | POA: Diagnosis not present

## 2016-06-24 DIAGNOSIS — Z992 Dependence on renal dialysis: Secondary | ICD-10-CM | POA: Diagnosis not present

## 2016-06-25 ENCOUNTER — Encounter: Payer: Self-pay | Admitting: Vascular Surgery

## 2016-06-26 DIAGNOSIS — Z992 Dependence on renal dialysis: Secondary | ICD-10-CM | POA: Diagnosis not present

## 2016-06-26 DIAGNOSIS — N186 End stage renal disease: Secondary | ICD-10-CM | POA: Diagnosis not present

## 2016-06-26 DIAGNOSIS — Z23 Encounter for immunization: Secondary | ICD-10-CM | POA: Diagnosis not present

## 2016-06-28 DIAGNOSIS — Z992 Dependence on renal dialysis: Secondary | ICD-10-CM | POA: Diagnosis not present

## 2016-06-28 DIAGNOSIS — N186 End stage renal disease: Secondary | ICD-10-CM | POA: Diagnosis not present

## 2016-06-28 DIAGNOSIS — Z23 Encounter for immunization: Secondary | ICD-10-CM | POA: Diagnosis not present

## 2016-07-01 DIAGNOSIS — Z23 Encounter for immunization: Secondary | ICD-10-CM | POA: Diagnosis not present

## 2016-07-01 DIAGNOSIS — Z992 Dependence on renal dialysis: Secondary | ICD-10-CM | POA: Diagnosis not present

## 2016-07-01 DIAGNOSIS — N186 End stage renal disease: Secondary | ICD-10-CM | POA: Diagnosis not present

## 2016-07-03 DIAGNOSIS — Z23 Encounter for immunization: Secondary | ICD-10-CM | POA: Diagnosis not present

## 2016-07-03 DIAGNOSIS — Z992 Dependence on renal dialysis: Secondary | ICD-10-CM | POA: Diagnosis not present

## 2016-07-03 DIAGNOSIS — N186 End stage renal disease: Secondary | ICD-10-CM | POA: Diagnosis not present

## 2016-07-05 DIAGNOSIS — N186 End stage renal disease: Secondary | ICD-10-CM | POA: Diagnosis not present

## 2016-07-05 DIAGNOSIS — Z992 Dependence on renal dialysis: Secondary | ICD-10-CM | POA: Diagnosis not present

## 2016-07-05 DIAGNOSIS — Z23 Encounter for immunization: Secondary | ICD-10-CM | POA: Diagnosis not present

## 2016-07-08 DIAGNOSIS — Z992 Dependence on renal dialysis: Secondary | ICD-10-CM | POA: Diagnosis not present

## 2016-07-08 DIAGNOSIS — N186 End stage renal disease: Secondary | ICD-10-CM | POA: Diagnosis not present

## 2016-07-08 DIAGNOSIS — Z23 Encounter for immunization: Secondary | ICD-10-CM | POA: Diagnosis not present

## 2016-07-10 DIAGNOSIS — Z992 Dependence on renal dialysis: Secondary | ICD-10-CM | POA: Diagnosis not present

## 2016-07-10 DIAGNOSIS — N186 End stage renal disease: Secondary | ICD-10-CM | POA: Diagnosis not present

## 2016-07-10 DIAGNOSIS — Z23 Encounter for immunization: Secondary | ICD-10-CM | POA: Diagnosis not present

## 2016-07-11 ENCOUNTER — Ambulatory Visit (INDEPENDENT_AMBULATORY_CARE_PROVIDER_SITE_OTHER): Payer: Commercial Managed Care - HMO | Admitting: Vascular Surgery

## 2016-07-11 ENCOUNTER — Encounter (INDEPENDENT_AMBULATORY_CARE_PROVIDER_SITE_OTHER): Payer: Self-pay | Admitting: Vascular Surgery

## 2016-07-11 DIAGNOSIS — N186 End stage renal disease: Secondary | ICD-10-CM | POA: Insufficient documentation

## 2016-07-11 DIAGNOSIS — I10 Essential (primary) hypertension: Secondary | ICD-10-CM

## 2016-07-11 DIAGNOSIS — T829XXS Unspecified complication of cardiac and vascular prosthetic device, implant and graft, sequela: Secondary | ICD-10-CM

## 2016-07-11 DIAGNOSIS — I953 Hypotension of hemodialysis: Secondary | ICD-10-CM

## 2016-07-11 DIAGNOSIS — T829XXA Unspecified complication of cardiac and vascular prosthetic device, implant and graft, initial encounter: Secondary | ICD-10-CM | POA: Insufficient documentation

## 2016-07-12 DIAGNOSIS — Z992 Dependence on renal dialysis: Secondary | ICD-10-CM | POA: Diagnosis not present

## 2016-07-12 DIAGNOSIS — Z23 Encounter for immunization: Secondary | ICD-10-CM | POA: Diagnosis not present

## 2016-07-12 DIAGNOSIS — N186 End stage renal disease: Secondary | ICD-10-CM | POA: Diagnosis not present

## 2016-07-15 DIAGNOSIS — Z23 Encounter for immunization: Secondary | ICD-10-CM | POA: Diagnosis not present

## 2016-07-15 DIAGNOSIS — Z992 Dependence on renal dialysis: Secondary | ICD-10-CM | POA: Diagnosis not present

## 2016-07-15 DIAGNOSIS — N186 End stage renal disease: Secondary | ICD-10-CM | POA: Diagnosis not present

## 2016-07-16 DIAGNOSIS — N186 End stage renal disease: Secondary | ICD-10-CM | POA: Diagnosis not present

## 2016-07-16 DIAGNOSIS — Z992 Dependence on renal dialysis: Secondary | ICD-10-CM | POA: Diagnosis not present

## 2016-07-17 DIAGNOSIS — N186 End stage renal disease: Secondary | ICD-10-CM | POA: Diagnosis not present

## 2016-07-17 DIAGNOSIS — Z992 Dependence on renal dialysis: Secondary | ICD-10-CM | POA: Diagnosis not present

## 2016-07-19 DIAGNOSIS — N186 End stage renal disease: Secondary | ICD-10-CM | POA: Diagnosis not present

## 2016-07-19 DIAGNOSIS — Z992 Dependence on renal dialysis: Secondary | ICD-10-CM | POA: Diagnosis not present

## 2016-07-21 NOTE — Progress Notes (Signed)
[facility] Admission History & Physical  MRN : [id number]  Kathleen Sharp is a 70 y.o. ([birth year]) female who presents with chief complaint of  Chief Complaint  Patient presents with  . Follow-up    Follow up for declot  .  History of Present Illness: The patient returns to the office for followup status post intervention of the dialysis access with thrombectomy and PTA of the venous outflow. Following the intervention the access function has significantly improved, with better flow rates and improved KT/V. The patient has not been experiencing increased bleeding times following decannulation and the patient denies increased recirculation. The patient denies an increase in arm swelling. At the present time the patient denies hand pain.  The patient denies amaurosis fugax or recent TIA symptoms. There are no recent neurological changes noted. The patient denies claudication symptoms or rest pain symptoms. The patient denies history of DVT, PE or superficial thrombophlebitis. The patient denies recent episodes of angina or shortness of breath.          Current Meds  Medication Sig  . atorvastatin (LIPITOR) 20 MG tablet Take 20 mg by mouth daily at 6 PM.   . clopidogrel (PLAVIX) 75 MG tablet Take 75 mg by mouth daily.  Marland Kitchen HYDROcodone-acetaminophen (NORCO) 5-325 MG tablet Take 1-2 tablets by mouth every 6 (six) hours as needed for moderate pain or severe pain.  Marland Kitchen lidocaine-prilocaine (EMLA) cream APPLY TO ACCESS SITE 30 MINUTES BEFORE TREATMENT  . lisinopril (PRINIVIL,ZESTRIL) 5 MG tablet Take 10 mg by mouth daily.   . multivitamin (RENA-VIT) TABS tablet Take 1 tablet by mouth daily.  Marland Kitchen RENVELA 800 MG tablet Take 1 tablet by mouth 3 (three) times daily.    Past Medical History:  Diagnosis Date  . Anemia   . Arthritis   . Chronic kidney disease    Chronic Kidney Disease  . Hypercholesterolemia   . Hypertension     Past Surgical History:  Procedure  Laterality Date  . ABDOMINAL HYSTERECTOMY    . AV FISTULA PLACEMENT Left 12/15/2015   Procedure: INSERTION OF ARTERIOVENOUS (AV) GORE-TEX GRAFT ARM             ( BRACHIAL AXILLARY GRAFT );  Surgeon: [name], MD;  Location: [facility];  Service: Vascular;  Laterality: Left;  . CAPD INSERTION N/A 11/24/2015   Procedure: Exploratory laparoscopy;  Surgeon: [name], MD;  Location: [facility];  Service: Vascular;  Laterality: N/A;, attempted insertion but unable due to scar tissue  . PERIPHERAL VASCULAR CATHETERIZATION Left 02/27/2016   Procedure: Thrombectomy;  Surgeon: [name], MD;  Location: [place] [facility];  Service: Cardiovascular;  Laterality: Left;  . PERIPHERAL VASCULAR CATHETERIZATION Left 04/12/2016   Procedure: Thrombectomy;  Surgeon: [name], MD;  Location: [place] [facility];  Service: Cardiovascular;  Laterality: Left;  . PERIPHERAL VASCULAR CATHETERIZATION Left 05/22/2016   Procedure: Thrombectomy;  Surgeon: [name], MD;  Location: [place] [facility];  Service: Cardiovascular;  Laterality: Left;  . PERIPHERAL VASCULAR CATHETERIZATION N/A 05/22/2016   Procedure: A/V Shuntogram/Fistulagram;  Surgeon: [name], MD;  Location: [place] [facility];  Service: Cardiovascular;  Laterality: N/A;  . PERIPHERAL VASCULAR CATHETERIZATION Left 06/21/2016   Procedure: Thrombectomy;  Surgeon: [name], MD;  Location: [place] [facility];  Service: Cardiovascular;  Laterality: Left;    Social History Social History  Substance Use Topics  . Smoking status: Former Smoker    Packs/day:  1.00    Years: 15.00    Types: Cigarettes    Quit date: 09/16/2012  . Smokeless tobacco: Never Used  . Alcohol use No    Family History Family History  Problem Relation Age of Onset  . Kidney disease Father   . Heart disease Mother   . Kidney disease Brother    No Known Allergies   REVIEW OF SYSTEMS (Negative unless checked)  Constitutional:  [] Weight loss  [] Fever  [] Chills Cardiac: [] Chest pain   [] Chest pressure   [] Palpitations   [] Shortness of breath when laying flat   [] Shortness of breath with exertion. Vascular:  [] Pain in legs with walking   [] Pain in legs at rest  [] History of DVT   [] Phlebitis   [] Swelling in legs   [] Varicose veins   [] Non-healing ulcers Pulmonary:   [] Uses home oxygen   [] Productive cough   [] Hemoptysis   [] Wheeze  [] COPD   [] Asthma Neurologic:  [] Dizziness   [] Seizures   [] History of stroke   [] History of TIA  [] Aphasia   [] Vissual changes   [] Weakness or numbness in arm   [] Weakness or numbness in leg Musculoskeletal:   [] Joint swelling   [] Joint pain   [] Low back pain Hematologic:  [] Easy bruising  [] Easy bleeding   [] Hypercoagulable state   [] Anemic Gastrointestinal:  [] Diarrhea   [] Vomiting  [] Gastroesophageal reflux/heartburn   [] Difficulty swallowing. Genitourinary:  [] Chronic kidney disease   [] Difficult urination  [] Frequent urination   [] Blood in urine Skin:  [] Rashes   [] Ulcers  Psychological:  [] History of anxiety   []  History of major depression.  Physical Examination  Vitals:   07/11/16 1036 07/11/16 1114  BP: (!) 176/120 119/77  Pulse: 97 82  Resp: 18 16  Weight: 130 lb (59 kg) 188 lb (85.3 kg)  Height: 5\' 4"  (1.626 m) 5\' 3"  (1.6 m)   Body mass index is 33.3 kg/m. Gen: WD/WN, NAD Head: [place]/AT, No temporalis wasting.  Ear/Nose/Throat: Hearing grossly intact, nares w/o erythema or drainage, poor dentition Eyes: PER, EOMI, sclera nonicteric.  Neck: Supple, no masses.  No bruit or JVD.  Pulmonary:  Good air movement, clear to auscultation bilaterally, no use of accessory muscles.  Cardiac: RRR, normal S1, S2, no Murmurs. Vascular: left arm brachial axillary AV graft with good thrill and good bruit Vessel Right Left  Radial Palpable Palpable  Ulnar Not Palpable Not Palpable  Brachial Palpable Palpable  Carotid Palpable Palpable  Femoral Palpable Palpable  Popliteal Palpable  Palpable  PT Palpable Palpable  DP Palpable Palpable   Gastrointestinal: soft, non-distended. No guarding/no peritoneal signs.  Musculoskeletal: M/S 5/5 throughout.  No deformity or atrophy.  Neurologic: CN 2-12 intact. Pain and light touch intact in extremities.  Symmetrical.  Speech is fluent. Motor exam as listed above. Psychiatric: Judgment intact, Mood & affect appropriate for pt's clinical situation. Dermatologic: No rashes or ulcers noted.  No changes consistent with cellulitis. Lymph : No Cervical lymphadenopathy, no lichenification or skin changes of chronic lymphedema.  CBC Lab Results  Component Value Date   WBC 9.3 12/07/2015   HGB 8.9 (L) 01/10/2016   HCT 33.0 (L) 12/15/2015   MCV 75.7 (L) 12/07/2015   PLT 372 12/07/2015    BMET    Component Value Date/Time   NA 139 12/15/2015 0845   K 4.8 05/22/2016 1444   CL 104 12/07/2015 1133   CO2 24 12/07/2015 1133   GLUCOSE 91 12/15/2015 0845   BUN 58 (H) 12/07/2015 1133   CREATININE 5.60 (  H) 12/07/2015 1133   CALCIUM 9.4 12/07/2015 1133   GFRNONAA 7 (L) 12/07/2015 1133   GFRAA 8 (L) 12/07/2015 1133   CrCl cannot be calculated (Patient's most recent lab result is older than the maximum 21 days allowed.).  COAG Lab Results  Component Value Date   INR 1.18 12/07/2015   INR 1.05 11/14/2015    Radiology No results found.  Assessment/Plan 1. End stage renal disease ([place]) Recommend:  The patient is doing well and currently has adequate dialysis access. The patient's dialysis center is not reporting any access issues. Flow pattern is stable when compared to the prior ultrasound.  The patient should have a duplex ultrasound of the dialysis access in 3 months. The patient will follow-up with me in the office after each ultrasound     2. Complication of vascular access for dialysis, sequela See #1 - VAS US DUPLEX DIALYSIS ACCESS (AVF,AVG); Future  3. Essential hypertension Continue antihypertensive  medications as already ordered an reviewed, no changes at this time.  4. Hemodialysis-associated hypotension Plan per nephrology - VAS US DUPLEX DIALYSIS ACCESS (AVF,AVG); Future    Hortencia Pilar, MD  07/21/2016 4:33 PM

## 2016-07-22 DIAGNOSIS — N186 End stage renal disease: Secondary | ICD-10-CM | POA: Diagnosis not present

## 2016-07-22 DIAGNOSIS — Z992 Dependence on renal dialysis: Secondary | ICD-10-CM | POA: Diagnosis not present

## 2016-07-24 DIAGNOSIS — N186 End stage renal disease: Secondary | ICD-10-CM | POA: Diagnosis not present

## 2016-07-24 DIAGNOSIS — Z992 Dependence on renal dialysis: Secondary | ICD-10-CM | POA: Diagnosis not present

## 2016-07-26 DIAGNOSIS — Z992 Dependence on renal dialysis: Secondary | ICD-10-CM | POA: Diagnosis not present

## 2016-07-26 DIAGNOSIS — N186 End stage renal disease: Secondary | ICD-10-CM | POA: Diagnosis not present

## 2016-07-29 DIAGNOSIS — Z992 Dependence on renal dialysis: Secondary | ICD-10-CM | POA: Diagnosis not present

## 2016-07-29 DIAGNOSIS — N186 End stage renal disease: Secondary | ICD-10-CM | POA: Diagnosis not present

## 2016-07-31 DIAGNOSIS — Z992 Dependence on renal dialysis: Secondary | ICD-10-CM | POA: Diagnosis not present

## 2016-07-31 DIAGNOSIS — N186 End stage renal disease: Secondary | ICD-10-CM | POA: Diagnosis not present

## 2016-08-03 DIAGNOSIS — Z992 Dependence on renal dialysis: Secondary | ICD-10-CM | POA: Diagnosis not present

## 2016-08-03 DIAGNOSIS — N186 End stage renal disease: Secondary | ICD-10-CM | POA: Diagnosis not present

## 2016-08-06 DIAGNOSIS — N186 End stage renal disease: Secondary | ICD-10-CM | POA: Diagnosis not present

## 2016-08-06 DIAGNOSIS — Z992 Dependence on renal dialysis: Secondary | ICD-10-CM | POA: Diagnosis not present

## 2016-08-09 DIAGNOSIS — N186 End stage renal disease: Secondary | ICD-10-CM | POA: Diagnosis not present

## 2016-08-09 DIAGNOSIS — Z992 Dependence on renal dialysis: Secondary | ICD-10-CM | POA: Diagnosis not present

## 2016-08-12 DIAGNOSIS — Z992 Dependence on renal dialysis: Secondary | ICD-10-CM | POA: Diagnosis not present

## 2016-08-12 DIAGNOSIS — N186 End stage renal disease: Secondary | ICD-10-CM | POA: Diagnosis not present

## 2016-08-14 DIAGNOSIS — N186 End stage renal disease: Secondary | ICD-10-CM | POA: Diagnosis not present

## 2016-08-14 DIAGNOSIS — Z992 Dependence on renal dialysis: Secondary | ICD-10-CM | POA: Diagnosis not present

## 2016-08-15 DIAGNOSIS — N186 End stage renal disease: Secondary | ICD-10-CM | POA: Diagnosis not present

## 2016-08-15 DIAGNOSIS — Z992 Dependence on renal dialysis: Secondary | ICD-10-CM | POA: Diagnosis not present

## 2016-08-16 DIAGNOSIS — N186 End stage renal disease: Secondary | ICD-10-CM | POA: Diagnosis not present

## 2016-08-16 DIAGNOSIS — Z992 Dependence on renal dialysis: Secondary | ICD-10-CM | POA: Diagnosis not present

## 2016-08-19 DIAGNOSIS — N186 End stage renal disease: Secondary | ICD-10-CM | POA: Diagnosis not present

## 2016-08-19 DIAGNOSIS — Z992 Dependence on renal dialysis: Secondary | ICD-10-CM | POA: Diagnosis not present

## 2016-08-21 DIAGNOSIS — N186 End stage renal disease: Secondary | ICD-10-CM | POA: Diagnosis not present

## 2016-08-21 DIAGNOSIS — Z992 Dependence on renal dialysis: Secondary | ICD-10-CM | POA: Diagnosis not present

## 2016-08-23 DIAGNOSIS — Z992 Dependence on renal dialysis: Secondary | ICD-10-CM | POA: Diagnosis not present

## 2016-08-23 DIAGNOSIS — N186 End stage renal disease: Secondary | ICD-10-CM | POA: Diagnosis not present

## 2016-08-26 DIAGNOSIS — N186 End stage renal disease: Secondary | ICD-10-CM | POA: Diagnosis not present

## 2016-08-26 DIAGNOSIS — Z992 Dependence on renal dialysis: Secondary | ICD-10-CM | POA: Diagnosis not present

## 2016-08-28 DIAGNOSIS — Z992 Dependence on renal dialysis: Secondary | ICD-10-CM | POA: Diagnosis not present

## 2016-08-28 DIAGNOSIS — N186 End stage renal disease: Secondary | ICD-10-CM | POA: Diagnosis not present

## 2016-08-30 DIAGNOSIS — N186 End stage renal disease: Secondary | ICD-10-CM | POA: Diagnosis not present

## 2016-08-30 DIAGNOSIS — Z992 Dependence on renal dialysis: Secondary | ICD-10-CM | POA: Diagnosis not present

## 2016-09-02 DIAGNOSIS — N186 End stage renal disease: Secondary | ICD-10-CM | POA: Diagnosis not present

## 2016-09-02 DIAGNOSIS — Z992 Dependence on renal dialysis: Secondary | ICD-10-CM | POA: Diagnosis not present

## 2016-09-04 DIAGNOSIS — Z992 Dependence on renal dialysis: Secondary | ICD-10-CM | POA: Diagnosis not present

## 2016-09-04 DIAGNOSIS — N186 End stage renal disease: Secondary | ICD-10-CM | POA: Diagnosis not present

## 2016-09-07 DIAGNOSIS — N186 End stage renal disease: Secondary | ICD-10-CM | POA: Diagnosis not present

## 2016-09-07 DIAGNOSIS — Z992 Dependence on renal dialysis: Secondary | ICD-10-CM | POA: Diagnosis not present

## 2016-09-10 DIAGNOSIS — N186 End stage renal disease: Secondary | ICD-10-CM | POA: Diagnosis not present

## 2016-09-10 DIAGNOSIS — Z992 Dependence on renal dialysis: Secondary | ICD-10-CM | POA: Diagnosis not present

## 2016-09-13 DIAGNOSIS — Z992 Dependence on renal dialysis: Secondary | ICD-10-CM | POA: Diagnosis not present

## 2016-09-13 DIAGNOSIS — N186 End stage renal disease: Secondary | ICD-10-CM | POA: Diagnosis not present

## 2016-09-15 DIAGNOSIS — N186 End stage renal disease: Secondary | ICD-10-CM | POA: Diagnosis not present

## 2016-09-15 DIAGNOSIS — Z992 Dependence on renal dialysis: Secondary | ICD-10-CM | POA: Diagnosis not present

## 2016-09-16 DIAGNOSIS — N186 End stage renal disease: Secondary | ICD-10-CM | POA: Diagnosis not present

## 2016-09-16 DIAGNOSIS — Z992 Dependence on renal dialysis: Secondary | ICD-10-CM | POA: Diagnosis not present

## 2016-09-18 DIAGNOSIS — Z992 Dependence on renal dialysis: Secondary | ICD-10-CM | POA: Diagnosis not present

## 2016-09-18 DIAGNOSIS — N186 End stage renal disease: Secondary | ICD-10-CM | POA: Diagnosis not present

## 2016-09-20 DIAGNOSIS — N186 End stage renal disease: Secondary | ICD-10-CM | POA: Diagnosis not present

## 2016-09-20 DIAGNOSIS — Z992 Dependence on renal dialysis: Secondary | ICD-10-CM | POA: Diagnosis not present

## 2016-09-23 DIAGNOSIS — Z992 Dependence on renal dialysis: Secondary | ICD-10-CM | POA: Diagnosis not present

## 2016-09-23 DIAGNOSIS — N186 End stage renal disease: Secondary | ICD-10-CM | POA: Diagnosis not present

## 2016-09-25 DIAGNOSIS — Z992 Dependence on renal dialysis: Secondary | ICD-10-CM | POA: Diagnosis not present

## 2016-09-25 DIAGNOSIS — N186 End stage renal disease: Secondary | ICD-10-CM | POA: Diagnosis not present

## 2016-09-26 DIAGNOSIS — Z992 Dependence on renal dialysis: Secondary | ICD-10-CM | POA: Diagnosis not present

## 2016-09-26 DIAGNOSIS — N186 End stage renal disease: Secondary | ICD-10-CM | POA: Diagnosis not present

## 2016-09-27 DIAGNOSIS — N186 End stage renal disease: Secondary | ICD-10-CM | POA: Diagnosis not present

## 2016-09-27 DIAGNOSIS — Z992 Dependence on renal dialysis: Secondary | ICD-10-CM | POA: Diagnosis not present

## 2016-09-30 DIAGNOSIS — N186 End stage renal disease: Secondary | ICD-10-CM | POA: Diagnosis not present

## 2016-09-30 DIAGNOSIS — Z992 Dependence on renal dialysis: Secondary | ICD-10-CM | POA: Diagnosis not present

## 2016-10-04 DIAGNOSIS — N186 End stage renal disease: Secondary | ICD-10-CM | POA: Diagnosis not present

## 2016-10-04 DIAGNOSIS — Z992 Dependence on renal dialysis: Secondary | ICD-10-CM | POA: Diagnosis not present

## 2016-10-07 DIAGNOSIS — Z992 Dependence on renal dialysis: Secondary | ICD-10-CM | POA: Diagnosis not present

## 2016-10-07 DIAGNOSIS — N186 End stage renal disease: Secondary | ICD-10-CM | POA: Diagnosis not present

## 2016-10-09 DIAGNOSIS — Z992 Dependence on renal dialysis: Secondary | ICD-10-CM | POA: Diagnosis not present

## 2016-10-09 DIAGNOSIS — N186 End stage renal disease: Secondary | ICD-10-CM | POA: Diagnosis not present

## 2016-10-11 DIAGNOSIS — Z992 Dependence on renal dialysis: Secondary | ICD-10-CM | POA: Diagnosis not present

## 2016-10-11 DIAGNOSIS — N186 End stage renal disease: Secondary | ICD-10-CM | POA: Diagnosis not present

## 2016-10-14 DIAGNOSIS — Z992 Dependence on renal dialysis: Secondary | ICD-10-CM | POA: Diagnosis not present

## 2016-10-14 DIAGNOSIS — N186 End stage renal disease: Secondary | ICD-10-CM | POA: Diagnosis not present

## 2016-10-16 DIAGNOSIS — N186 End stage renal disease: Secondary | ICD-10-CM | POA: Diagnosis not present

## 2016-10-16 DIAGNOSIS — Z992 Dependence on renal dialysis: Secondary | ICD-10-CM | POA: Diagnosis not present

## 2016-10-17 DIAGNOSIS — Z992 Dependence on renal dialysis: Secondary | ICD-10-CM | POA: Diagnosis not present

## 2016-10-17 DIAGNOSIS — N186 End stage renal disease: Secondary | ICD-10-CM | POA: Diagnosis not present

## 2016-10-18 DIAGNOSIS — N186 End stage renal disease: Secondary | ICD-10-CM | POA: Diagnosis not present

## 2016-10-18 DIAGNOSIS — Z992 Dependence on renal dialysis: Secondary | ICD-10-CM | POA: Diagnosis not present

## 2016-10-21 DIAGNOSIS — N186 End stage renal disease: Secondary | ICD-10-CM | POA: Diagnosis not present

## 2016-10-21 DIAGNOSIS — Z992 Dependence on renal dialysis: Secondary | ICD-10-CM | POA: Diagnosis not present

## 2016-10-23 DIAGNOSIS — Z992 Dependence on renal dialysis: Secondary | ICD-10-CM | POA: Diagnosis not present

## 2016-10-23 DIAGNOSIS — N186 End stage renal disease: Secondary | ICD-10-CM | POA: Diagnosis not present

## 2016-10-25 DIAGNOSIS — N186 End stage renal disease: Secondary | ICD-10-CM | POA: Diagnosis not present

## 2016-10-25 DIAGNOSIS — Z992 Dependence on renal dialysis: Secondary | ICD-10-CM | POA: Diagnosis not present

## 2016-10-28 DIAGNOSIS — Z992 Dependence on renal dialysis: Secondary | ICD-10-CM | POA: Diagnosis not present

## 2016-10-28 DIAGNOSIS — N186 End stage renal disease: Secondary | ICD-10-CM | POA: Diagnosis not present

## 2016-10-30 DIAGNOSIS — N186 End stage renal disease: Secondary | ICD-10-CM | POA: Diagnosis not present

## 2016-10-30 DIAGNOSIS — Z992 Dependence on renal dialysis: Secondary | ICD-10-CM | POA: Diagnosis not present

## 2016-10-31 ENCOUNTER — Ambulatory Visit (INDEPENDENT_AMBULATORY_CARE_PROVIDER_SITE_OTHER): Payer: Self-pay | Admitting: Vascular Surgery

## 2016-10-31 ENCOUNTER — Encounter (INDEPENDENT_AMBULATORY_CARE_PROVIDER_SITE_OTHER): Payer: Self-pay

## 2016-11-01 DIAGNOSIS — Z992 Dependence on renal dialysis: Secondary | ICD-10-CM | POA: Diagnosis not present

## 2016-11-01 DIAGNOSIS — N186 End stage renal disease: Secondary | ICD-10-CM | POA: Diagnosis not present

## 2016-11-04 DIAGNOSIS — N186 End stage renal disease: Secondary | ICD-10-CM | POA: Diagnosis not present

## 2016-11-04 DIAGNOSIS — Z992 Dependence on renal dialysis: Secondary | ICD-10-CM | POA: Diagnosis not present

## 2016-11-06 DIAGNOSIS — Z992 Dependence on renal dialysis: Secondary | ICD-10-CM | POA: Diagnosis not present

## 2016-11-06 DIAGNOSIS — N186 End stage renal disease: Secondary | ICD-10-CM | POA: Diagnosis not present

## 2016-11-08 DIAGNOSIS — Z992 Dependence on renal dialysis: Secondary | ICD-10-CM | POA: Diagnosis not present

## 2016-11-08 DIAGNOSIS — N186 End stage renal disease: Secondary | ICD-10-CM | POA: Diagnosis not present

## 2016-11-11 DIAGNOSIS — N186 End stage renal disease: Secondary | ICD-10-CM | POA: Diagnosis not present

## 2016-11-11 DIAGNOSIS — Z992 Dependence on renal dialysis: Secondary | ICD-10-CM | POA: Diagnosis not present

## 2016-11-13 DIAGNOSIS — N186 End stage renal disease: Secondary | ICD-10-CM | POA: Diagnosis not present

## 2016-11-13 DIAGNOSIS — Z992 Dependence on renal dialysis: Secondary | ICD-10-CM | POA: Diagnosis not present

## 2016-11-15 DIAGNOSIS — Z992 Dependence on renal dialysis: Secondary | ICD-10-CM | POA: Diagnosis not present

## 2016-11-15 DIAGNOSIS — N186 End stage renal disease: Secondary | ICD-10-CM | POA: Diagnosis not present

## 2016-11-18 DIAGNOSIS — Z992 Dependence on renal dialysis: Secondary | ICD-10-CM | POA: Diagnosis not present

## 2016-11-18 DIAGNOSIS — N186 End stage renal disease: Secondary | ICD-10-CM | POA: Diagnosis not present

## 2016-11-20 DIAGNOSIS — N186 End stage renal disease: Secondary | ICD-10-CM | POA: Diagnosis not present

## 2016-11-20 DIAGNOSIS — Z992 Dependence on renal dialysis: Secondary | ICD-10-CM | POA: Diagnosis not present

## 2016-11-23 DIAGNOSIS — Z992 Dependence on renal dialysis: Secondary | ICD-10-CM | POA: Diagnosis not present

## 2016-11-23 DIAGNOSIS — N186 End stage renal disease: Secondary | ICD-10-CM | POA: Diagnosis not present

## 2016-11-25 DIAGNOSIS — Z992 Dependence on renal dialysis: Secondary | ICD-10-CM | POA: Diagnosis not present

## 2016-11-25 DIAGNOSIS — N186 End stage renal disease: Secondary | ICD-10-CM | POA: Diagnosis not present

## 2016-11-27 DIAGNOSIS — N186 End stage renal disease: Secondary | ICD-10-CM | POA: Diagnosis not present

## 2016-11-27 DIAGNOSIS — Z992 Dependence on renal dialysis: Secondary | ICD-10-CM | POA: Diagnosis not present

## 2016-11-29 DIAGNOSIS — N186 End stage renal disease: Secondary | ICD-10-CM | POA: Diagnosis not present

## 2016-11-29 DIAGNOSIS — Z992 Dependence on renal dialysis: Secondary | ICD-10-CM | POA: Diagnosis not present

## 2016-12-02 DIAGNOSIS — N186 End stage renal disease: Secondary | ICD-10-CM | POA: Diagnosis not present

## 2016-12-02 DIAGNOSIS — Z992 Dependence on renal dialysis: Secondary | ICD-10-CM | POA: Diagnosis not present

## 2016-12-04 DIAGNOSIS — Z992 Dependence on renal dialysis: Secondary | ICD-10-CM | POA: Diagnosis not present

## 2016-12-04 DIAGNOSIS — N186 End stage renal disease: Secondary | ICD-10-CM | POA: Diagnosis not present

## 2016-12-06 DIAGNOSIS — Z992 Dependence on renal dialysis: Secondary | ICD-10-CM | POA: Diagnosis not present

## 2016-12-06 DIAGNOSIS — N186 End stage renal disease: Secondary | ICD-10-CM | POA: Diagnosis not present

## 2016-12-09 DIAGNOSIS — N186 End stage renal disease: Secondary | ICD-10-CM | POA: Diagnosis not present

## 2016-12-09 DIAGNOSIS — Z992 Dependence on renal dialysis: Secondary | ICD-10-CM | POA: Diagnosis not present

## 2016-12-11 DIAGNOSIS — N186 End stage renal disease: Secondary | ICD-10-CM | POA: Diagnosis not present

## 2016-12-11 DIAGNOSIS — Z992 Dependence on renal dialysis: Secondary | ICD-10-CM | POA: Diagnosis not present

## 2016-12-13 DIAGNOSIS — Z992 Dependence on renal dialysis: Secondary | ICD-10-CM | POA: Diagnosis not present

## 2016-12-13 DIAGNOSIS — N186 End stage renal disease: Secondary | ICD-10-CM | POA: Diagnosis not present

## 2016-12-14 DIAGNOSIS — N186 End stage renal disease: Secondary | ICD-10-CM | POA: Diagnosis not present

## 2016-12-14 DIAGNOSIS — Z992 Dependence on renal dialysis: Secondary | ICD-10-CM | POA: Diagnosis not present

## 2016-12-16 DIAGNOSIS — Z992 Dependence on renal dialysis: Secondary | ICD-10-CM | POA: Diagnosis not present

## 2016-12-16 DIAGNOSIS — N186 End stage renal disease: Secondary | ICD-10-CM | POA: Diagnosis not present

## 2016-12-18 DIAGNOSIS — Z992 Dependence on renal dialysis: Secondary | ICD-10-CM | POA: Diagnosis not present

## 2016-12-18 DIAGNOSIS — N186 End stage renal disease: Secondary | ICD-10-CM | POA: Diagnosis not present

## 2016-12-20 DIAGNOSIS — N186 End stage renal disease: Secondary | ICD-10-CM | POA: Diagnosis not present

## 2016-12-20 DIAGNOSIS — Z992 Dependence on renal dialysis: Secondary | ICD-10-CM | POA: Diagnosis not present

## 2016-12-23 DIAGNOSIS — N186 End stage renal disease: Secondary | ICD-10-CM | POA: Diagnosis not present

## 2016-12-23 DIAGNOSIS — Z992 Dependence on renal dialysis: Secondary | ICD-10-CM | POA: Diagnosis not present

## 2016-12-25 DIAGNOSIS — N186 End stage renal disease: Secondary | ICD-10-CM | POA: Diagnosis not present

## 2016-12-25 DIAGNOSIS — Z992 Dependence on renal dialysis: Secondary | ICD-10-CM | POA: Diagnosis not present

## 2016-12-28 DIAGNOSIS — N186 End stage renal disease: Secondary | ICD-10-CM | POA: Diagnosis not present

## 2016-12-28 DIAGNOSIS — Z992 Dependence on renal dialysis: Secondary | ICD-10-CM | POA: Diagnosis not present

## 2016-12-30 DIAGNOSIS — N186 End stage renal disease: Secondary | ICD-10-CM | POA: Diagnosis not present

## 2016-12-30 DIAGNOSIS — Z992 Dependence on renal dialysis: Secondary | ICD-10-CM | POA: Diagnosis not present

## 2017-01-01 DIAGNOSIS — N186 End stage renal disease: Secondary | ICD-10-CM | POA: Diagnosis not present

## 2017-01-01 DIAGNOSIS — Z992 Dependence on renal dialysis: Secondary | ICD-10-CM | POA: Diagnosis not present

## 2017-01-02 DIAGNOSIS — T82858A Stenosis of vascular prosthetic devices, implants and grafts, initial encounter: Secondary | ICD-10-CM | POA: Diagnosis not present

## 2017-01-02 DIAGNOSIS — I871 Compression of vein: Secondary | ICD-10-CM | POA: Diagnosis not present

## 2017-01-02 DIAGNOSIS — I771 Stricture of artery: Secondary | ICD-10-CM | POA: Diagnosis not present

## 2017-01-02 DIAGNOSIS — T82868A Thrombosis of vascular prosthetic devices, implants and grafts, initial encounter: Secondary | ICD-10-CM | POA: Diagnosis not present

## 2017-01-03 DIAGNOSIS — N186 End stage renal disease: Secondary | ICD-10-CM | POA: Diagnosis not present

## 2017-01-03 DIAGNOSIS — Z992 Dependence on renal dialysis: Secondary | ICD-10-CM | POA: Diagnosis not present

## 2017-01-06 DIAGNOSIS — N186 End stage renal disease: Secondary | ICD-10-CM | POA: Diagnosis not present

## 2017-01-06 DIAGNOSIS — Z992 Dependence on renal dialysis: Secondary | ICD-10-CM | POA: Diagnosis not present

## 2017-01-08 DIAGNOSIS — Z992 Dependence on renal dialysis: Secondary | ICD-10-CM | POA: Diagnosis not present

## 2017-01-08 DIAGNOSIS — N186 End stage renal disease: Secondary | ICD-10-CM | POA: Diagnosis not present

## 2017-01-09 ENCOUNTER — Ambulatory Visit (INDEPENDENT_AMBULATORY_CARE_PROVIDER_SITE_OTHER): Payer: Medicare HMO | Admitting: Vascular Surgery

## 2017-01-09 ENCOUNTER — Encounter (INDEPENDENT_AMBULATORY_CARE_PROVIDER_SITE_OTHER): Payer: Medicare HMO

## 2017-01-10 DIAGNOSIS — Z992 Dependence on renal dialysis: Secondary | ICD-10-CM | POA: Diagnosis not present

## 2017-01-10 DIAGNOSIS — N186 End stage renal disease: Secondary | ICD-10-CM | POA: Diagnosis not present

## 2017-01-13 DIAGNOSIS — Z992 Dependence on renal dialysis: Secondary | ICD-10-CM | POA: Diagnosis not present

## 2017-01-13 DIAGNOSIS — N186 End stage renal disease: Secondary | ICD-10-CM | POA: Diagnosis not present

## 2017-01-15 DIAGNOSIS — Z992 Dependence on renal dialysis: Secondary | ICD-10-CM | POA: Diagnosis not present

## 2017-01-15 DIAGNOSIS — N186 End stage renal disease: Secondary | ICD-10-CM | POA: Diagnosis not present

## 2017-01-17 DIAGNOSIS — Z992 Dependence on renal dialysis: Secondary | ICD-10-CM | POA: Diagnosis not present

## 2017-01-17 DIAGNOSIS — N186 End stage renal disease: Secondary | ICD-10-CM | POA: Diagnosis not present

## 2017-01-20 DIAGNOSIS — Z992 Dependence on renal dialysis: Secondary | ICD-10-CM | POA: Diagnosis not present

## 2017-01-20 DIAGNOSIS — N186 End stage renal disease: Secondary | ICD-10-CM | POA: Diagnosis not present

## 2017-01-22 DIAGNOSIS — N186 End stage renal disease: Secondary | ICD-10-CM | POA: Diagnosis not present

## 2017-01-22 DIAGNOSIS — Z992 Dependence on renal dialysis: Secondary | ICD-10-CM | POA: Diagnosis not present

## 2017-01-24 DIAGNOSIS — Z992 Dependence on renal dialysis: Secondary | ICD-10-CM | POA: Diagnosis not present

## 2017-01-24 DIAGNOSIS — N186 End stage renal disease: Secondary | ICD-10-CM | POA: Diagnosis not present

## 2017-01-27 DIAGNOSIS — N186 End stage renal disease: Secondary | ICD-10-CM | POA: Diagnosis not present

## 2017-01-27 DIAGNOSIS — Z992 Dependence on renal dialysis: Secondary | ICD-10-CM | POA: Diagnosis not present

## 2017-01-29 DIAGNOSIS — N186 End stage renal disease: Secondary | ICD-10-CM | POA: Diagnosis not present

## 2017-01-29 DIAGNOSIS — Z992 Dependence on renal dialysis: Secondary | ICD-10-CM | POA: Diagnosis not present

## 2017-01-31 DIAGNOSIS — Z992 Dependence on renal dialysis: Secondary | ICD-10-CM | POA: Diagnosis not present

## 2017-01-31 DIAGNOSIS — N186 End stage renal disease: Secondary | ICD-10-CM | POA: Diagnosis not present

## 2017-02-03 DIAGNOSIS — N186 End stage renal disease: Secondary | ICD-10-CM | POA: Diagnosis not present

## 2017-02-03 DIAGNOSIS — Z992 Dependence on renal dialysis: Secondary | ICD-10-CM | POA: Diagnosis not present

## 2017-02-05 DIAGNOSIS — Z992 Dependence on renal dialysis: Secondary | ICD-10-CM | POA: Diagnosis not present

## 2017-02-05 DIAGNOSIS — N186 End stage renal disease: Secondary | ICD-10-CM | POA: Diagnosis not present

## 2017-02-07 DIAGNOSIS — Z992 Dependence on renal dialysis: Secondary | ICD-10-CM | POA: Diagnosis not present

## 2017-02-07 DIAGNOSIS — N186 End stage renal disease: Secondary | ICD-10-CM | POA: Diagnosis not present

## 2017-02-10 DIAGNOSIS — Z992 Dependence on renal dialysis: Secondary | ICD-10-CM | POA: Diagnosis not present

## 2017-02-10 DIAGNOSIS — N186 End stage renal disease: Secondary | ICD-10-CM | POA: Diagnosis not present

## 2017-02-12 DIAGNOSIS — Z992 Dependence on renal dialysis: Secondary | ICD-10-CM | POA: Diagnosis not present

## 2017-02-12 DIAGNOSIS — N186 End stage renal disease: Secondary | ICD-10-CM | POA: Diagnosis not present

## 2017-02-13 DIAGNOSIS — Z992 Dependence on renal dialysis: Secondary | ICD-10-CM | POA: Diagnosis not present

## 2017-02-13 DIAGNOSIS — N186 End stage renal disease: Secondary | ICD-10-CM | POA: Diagnosis not present

## 2017-02-14 DIAGNOSIS — Z992 Dependence on renal dialysis: Secondary | ICD-10-CM | POA: Diagnosis not present

## 2017-02-14 DIAGNOSIS — N186 End stage renal disease: Secondary | ICD-10-CM | POA: Diagnosis not present

## 2017-02-17 DIAGNOSIS — N186 End stage renal disease: Secondary | ICD-10-CM | POA: Diagnosis not present

## 2017-02-17 DIAGNOSIS — Z992 Dependence on renal dialysis: Secondary | ICD-10-CM | POA: Diagnosis not present

## 2017-02-19 DIAGNOSIS — Z992 Dependence on renal dialysis: Secondary | ICD-10-CM | POA: Diagnosis not present

## 2017-02-19 DIAGNOSIS — N186 End stage renal disease: Secondary | ICD-10-CM | POA: Diagnosis not present

## 2017-02-21 DIAGNOSIS — N186 End stage renal disease: Secondary | ICD-10-CM | POA: Diagnosis not present

## 2017-02-21 DIAGNOSIS — Z992 Dependence on renal dialysis: Secondary | ICD-10-CM | POA: Diagnosis not present

## 2017-02-24 DIAGNOSIS — N186 End stage renal disease: Secondary | ICD-10-CM | POA: Diagnosis not present

## 2017-02-24 DIAGNOSIS — Z992 Dependence on renal dialysis: Secondary | ICD-10-CM | POA: Diagnosis not present

## 2017-02-26 DIAGNOSIS — Z992 Dependence on renal dialysis: Secondary | ICD-10-CM | POA: Diagnosis not present

## 2017-02-26 DIAGNOSIS — N186 End stage renal disease: Secondary | ICD-10-CM | POA: Diagnosis not present

## 2017-02-28 DIAGNOSIS — Z992 Dependence on renal dialysis: Secondary | ICD-10-CM | POA: Diagnosis not present

## 2017-02-28 DIAGNOSIS — N186 End stage renal disease: Secondary | ICD-10-CM | POA: Diagnosis not present

## 2017-03-03 DIAGNOSIS — N186 End stage renal disease: Secondary | ICD-10-CM | POA: Diagnosis not present

## 2017-03-03 DIAGNOSIS — Z992 Dependence on renal dialysis: Secondary | ICD-10-CM | POA: Diagnosis not present

## 2017-03-05 DIAGNOSIS — Z992 Dependence on renal dialysis: Secondary | ICD-10-CM | POA: Diagnosis not present

## 2017-03-05 DIAGNOSIS — N186 End stage renal disease: Secondary | ICD-10-CM | POA: Diagnosis not present

## 2017-03-07 DIAGNOSIS — Z992 Dependence on renal dialysis: Secondary | ICD-10-CM | POA: Diagnosis not present

## 2017-03-07 DIAGNOSIS — N186 End stage renal disease: Secondary | ICD-10-CM | POA: Diagnosis not present

## 2017-03-10 DIAGNOSIS — Z992 Dependence on renal dialysis: Secondary | ICD-10-CM | POA: Diagnosis not present

## 2017-03-10 DIAGNOSIS — N186 End stage renal disease: Secondary | ICD-10-CM | POA: Diagnosis not present

## 2017-03-12 DIAGNOSIS — N186 End stage renal disease: Secondary | ICD-10-CM | POA: Diagnosis not present

## 2017-03-12 DIAGNOSIS — Z992 Dependence on renal dialysis: Secondary | ICD-10-CM | POA: Diagnosis not present

## 2017-03-14 DIAGNOSIS — Z992 Dependence on renal dialysis: Secondary | ICD-10-CM | POA: Diagnosis not present

## 2017-03-14 DIAGNOSIS — N186 End stage renal disease: Secondary | ICD-10-CM | POA: Diagnosis not present

## 2017-03-15 DIAGNOSIS — N186 End stage renal disease: Secondary | ICD-10-CM | POA: Diagnosis not present

## 2017-03-15 DIAGNOSIS — Z992 Dependence on renal dialysis: Secondary | ICD-10-CM | POA: Diagnosis not present

## 2017-03-17 DIAGNOSIS — N186 End stage renal disease: Secondary | ICD-10-CM | POA: Diagnosis not present

## 2017-03-17 DIAGNOSIS — Z992 Dependence on renal dialysis: Secondary | ICD-10-CM | POA: Diagnosis not present

## 2017-03-19 DIAGNOSIS — Z992 Dependence on renal dialysis: Secondary | ICD-10-CM | POA: Diagnosis not present

## 2017-03-19 DIAGNOSIS — N186 End stage renal disease: Secondary | ICD-10-CM | POA: Diagnosis not present

## 2017-03-21 DIAGNOSIS — N186 End stage renal disease: Secondary | ICD-10-CM | POA: Diagnosis not present

## 2017-03-21 DIAGNOSIS — Z992 Dependence on renal dialysis: Secondary | ICD-10-CM | POA: Diagnosis not present

## 2017-03-24 DIAGNOSIS — N186 End stage renal disease: Secondary | ICD-10-CM | POA: Diagnosis not present

## 2017-03-24 DIAGNOSIS — Z992 Dependence on renal dialysis: Secondary | ICD-10-CM | POA: Diagnosis not present

## 2017-03-26 DIAGNOSIS — N186 End stage renal disease: Secondary | ICD-10-CM | POA: Diagnosis not present

## 2017-03-26 DIAGNOSIS — Z992 Dependence on renal dialysis: Secondary | ICD-10-CM | POA: Diagnosis not present

## 2017-03-28 DIAGNOSIS — N186 End stage renal disease: Secondary | ICD-10-CM | POA: Diagnosis not present

## 2017-03-28 DIAGNOSIS — Z992 Dependence on renal dialysis: Secondary | ICD-10-CM | POA: Diagnosis not present

## 2017-03-31 DIAGNOSIS — Z992 Dependence on renal dialysis: Secondary | ICD-10-CM | POA: Diagnosis not present

## 2017-03-31 DIAGNOSIS — N186 End stage renal disease: Secondary | ICD-10-CM | POA: Diagnosis not present

## 2017-04-02 DIAGNOSIS — Z992 Dependence on renal dialysis: Secondary | ICD-10-CM | POA: Diagnosis not present

## 2017-04-02 DIAGNOSIS — N186 End stage renal disease: Secondary | ICD-10-CM | POA: Diagnosis not present

## 2017-04-04 DIAGNOSIS — N186 End stage renal disease: Secondary | ICD-10-CM | POA: Diagnosis not present

## 2017-04-04 DIAGNOSIS — Z992 Dependence on renal dialysis: Secondary | ICD-10-CM | POA: Diagnosis not present

## 2017-04-07 DIAGNOSIS — N186 End stage renal disease: Secondary | ICD-10-CM | POA: Diagnosis not present

## 2017-04-07 DIAGNOSIS — Z992 Dependence on renal dialysis: Secondary | ICD-10-CM | POA: Diagnosis not present

## 2017-04-09 DIAGNOSIS — N186 End stage renal disease: Secondary | ICD-10-CM | POA: Diagnosis not present

## 2017-04-09 DIAGNOSIS — Z992 Dependence on renal dialysis: Secondary | ICD-10-CM | POA: Diagnosis not present

## 2017-04-11 DIAGNOSIS — N186 End stage renal disease: Secondary | ICD-10-CM | POA: Diagnosis not present

## 2017-04-11 DIAGNOSIS — Z992 Dependence on renal dialysis: Secondary | ICD-10-CM | POA: Diagnosis not present

## 2017-04-14 DIAGNOSIS — Z992 Dependence on renal dialysis: Secondary | ICD-10-CM | POA: Diagnosis not present

## 2017-04-14 DIAGNOSIS — N186 End stage renal disease: Secondary | ICD-10-CM | POA: Diagnosis not present

## 2017-04-15 DIAGNOSIS — N186 End stage renal disease: Secondary | ICD-10-CM | POA: Diagnosis not present

## 2017-04-15 DIAGNOSIS — Z992 Dependence on renal dialysis: Secondary | ICD-10-CM | POA: Diagnosis not present

## 2017-04-16 DIAGNOSIS — Z992 Dependence on renal dialysis: Secondary | ICD-10-CM | POA: Diagnosis not present

## 2017-04-16 DIAGNOSIS — N186 End stage renal disease: Secondary | ICD-10-CM | POA: Diagnosis not present

## 2017-04-18 DIAGNOSIS — Z992 Dependence on renal dialysis: Secondary | ICD-10-CM | POA: Diagnosis not present

## 2017-04-18 DIAGNOSIS — N186 End stage renal disease: Secondary | ICD-10-CM | POA: Diagnosis not present

## 2017-04-21 DIAGNOSIS — Z992 Dependence on renal dialysis: Secondary | ICD-10-CM | POA: Diagnosis not present

## 2017-04-21 DIAGNOSIS — N186 End stage renal disease: Secondary | ICD-10-CM | POA: Diagnosis not present

## 2017-04-23 DIAGNOSIS — Z992 Dependence on renal dialysis: Secondary | ICD-10-CM | POA: Diagnosis not present

## 2017-04-23 DIAGNOSIS — N186 End stage renal disease: Secondary | ICD-10-CM | POA: Diagnosis not present

## 2017-04-25 DIAGNOSIS — N186 End stage renal disease: Secondary | ICD-10-CM | POA: Diagnosis not present

## 2017-04-25 DIAGNOSIS — Z992 Dependence on renal dialysis: Secondary | ICD-10-CM | POA: Diagnosis not present

## 2017-04-28 DIAGNOSIS — Z992 Dependence on renal dialysis: Secondary | ICD-10-CM | POA: Diagnosis not present

## 2017-04-28 DIAGNOSIS — N186 End stage renal disease: Secondary | ICD-10-CM | POA: Diagnosis not present

## 2017-04-30 DIAGNOSIS — N186 End stage renal disease: Secondary | ICD-10-CM | POA: Diagnosis not present

## 2017-04-30 DIAGNOSIS — Z992 Dependence on renal dialysis: Secondary | ICD-10-CM | POA: Diagnosis not present

## 2017-05-02 DIAGNOSIS — N186 End stage renal disease: Secondary | ICD-10-CM | POA: Diagnosis not present

## 2017-05-02 DIAGNOSIS — Z992 Dependence on renal dialysis: Secondary | ICD-10-CM | POA: Diagnosis not present

## 2017-05-05 DIAGNOSIS — N186 End stage renal disease: Secondary | ICD-10-CM | POA: Diagnosis not present

## 2017-05-05 DIAGNOSIS — Z992 Dependence on renal dialysis: Secondary | ICD-10-CM | POA: Diagnosis not present

## 2017-05-07 DIAGNOSIS — Z992 Dependence on renal dialysis: Secondary | ICD-10-CM | POA: Diagnosis not present

## 2017-05-07 DIAGNOSIS — N186 End stage renal disease: Secondary | ICD-10-CM | POA: Diagnosis not present

## 2017-05-09 DIAGNOSIS — N186 End stage renal disease: Secondary | ICD-10-CM | POA: Diagnosis not present

## 2017-05-09 DIAGNOSIS — Z992 Dependence on renal dialysis: Secondary | ICD-10-CM | POA: Diagnosis not present

## 2017-05-12 DIAGNOSIS — N186 End stage renal disease: Secondary | ICD-10-CM | POA: Diagnosis not present

## 2017-05-12 DIAGNOSIS — Z992 Dependence on renal dialysis: Secondary | ICD-10-CM | POA: Diagnosis not present

## 2017-05-14 DIAGNOSIS — Z992 Dependence on renal dialysis: Secondary | ICD-10-CM | POA: Diagnosis not present

## 2017-05-14 DIAGNOSIS — N186 End stage renal disease: Secondary | ICD-10-CM | POA: Diagnosis not present

## 2017-05-16 DIAGNOSIS — N186 End stage renal disease: Secondary | ICD-10-CM | POA: Diagnosis not present

## 2017-05-16 DIAGNOSIS — Z992 Dependence on renal dialysis: Secondary | ICD-10-CM | POA: Diagnosis not present

## 2017-05-19 DIAGNOSIS — N186 End stage renal disease: Secondary | ICD-10-CM | POA: Diagnosis not present

## 2017-05-19 DIAGNOSIS — Z992 Dependence on renal dialysis: Secondary | ICD-10-CM | POA: Diagnosis not present

## 2017-05-21 DIAGNOSIS — Z992 Dependence on renal dialysis: Secondary | ICD-10-CM | POA: Diagnosis not present

## 2017-05-21 DIAGNOSIS — N186 End stage renal disease: Secondary | ICD-10-CM | POA: Diagnosis not present

## 2017-05-23 DIAGNOSIS — N186 End stage renal disease: Secondary | ICD-10-CM | POA: Diagnosis not present

## 2017-05-23 DIAGNOSIS — Z992 Dependence on renal dialysis: Secondary | ICD-10-CM | POA: Diagnosis not present

## 2017-05-26 DIAGNOSIS — Z992 Dependence on renal dialysis: Secondary | ICD-10-CM | POA: Diagnosis not present

## 2017-05-26 DIAGNOSIS — N186 End stage renal disease: Secondary | ICD-10-CM | POA: Diagnosis not present

## 2017-05-28 DIAGNOSIS — Z992 Dependence on renal dialysis: Secondary | ICD-10-CM | POA: Diagnosis not present

## 2017-05-28 DIAGNOSIS — N186 End stage renal disease: Secondary | ICD-10-CM | POA: Diagnosis not present

## 2017-05-30 DIAGNOSIS — Z992 Dependence on renal dialysis: Secondary | ICD-10-CM | POA: Diagnosis not present

## 2017-05-30 DIAGNOSIS — N186 End stage renal disease: Secondary | ICD-10-CM | POA: Diagnosis not present

## 2017-06-02 DIAGNOSIS — N186 End stage renal disease: Secondary | ICD-10-CM | POA: Diagnosis not present

## 2017-06-02 DIAGNOSIS — Z992 Dependence on renal dialysis: Secondary | ICD-10-CM | POA: Diagnosis not present

## 2017-06-04 DIAGNOSIS — N186 End stage renal disease: Secondary | ICD-10-CM | POA: Diagnosis not present

## 2017-06-04 DIAGNOSIS — Z992 Dependence on renal dialysis: Secondary | ICD-10-CM | POA: Diagnosis not present

## 2017-06-06 DIAGNOSIS — N186 End stage renal disease: Secondary | ICD-10-CM | POA: Diagnosis not present

## 2017-06-06 DIAGNOSIS — Z992 Dependence on renal dialysis: Secondary | ICD-10-CM | POA: Diagnosis not present

## 2017-06-09 DIAGNOSIS — Z992 Dependence on renal dialysis: Secondary | ICD-10-CM | POA: Diagnosis not present

## 2017-06-09 DIAGNOSIS — N186 End stage renal disease: Secondary | ICD-10-CM | POA: Diagnosis not present

## 2017-06-11 DIAGNOSIS — N186 End stage renal disease: Secondary | ICD-10-CM | POA: Diagnosis not present

## 2017-06-11 DIAGNOSIS — Z992 Dependence on renal dialysis: Secondary | ICD-10-CM | POA: Diagnosis not present

## 2017-06-13 DIAGNOSIS — Z992 Dependence on renal dialysis: Secondary | ICD-10-CM | POA: Diagnosis not present

## 2017-06-13 DIAGNOSIS — N186 End stage renal disease: Secondary | ICD-10-CM | POA: Diagnosis not present

## 2017-06-15 DIAGNOSIS — N186 End stage renal disease: Secondary | ICD-10-CM | POA: Diagnosis not present

## 2017-06-15 DIAGNOSIS — Z992 Dependence on renal dialysis: Secondary | ICD-10-CM | POA: Diagnosis not present

## 2017-06-16 DIAGNOSIS — N186 End stage renal disease: Secondary | ICD-10-CM | POA: Diagnosis not present

## 2017-06-16 DIAGNOSIS — Z23 Encounter for immunization: Secondary | ICD-10-CM | POA: Diagnosis not present

## 2017-06-16 DIAGNOSIS — Z992 Dependence on renal dialysis: Secondary | ICD-10-CM | POA: Diagnosis not present

## 2017-06-18 DIAGNOSIS — N186 End stage renal disease: Secondary | ICD-10-CM | POA: Diagnosis not present

## 2017-06-18 DIAGNOSIS — Z23 Encounter for immunization: Secondary | ICD-10-CM | POA: Diagnosis not present

## 2017-06-18 DIAGNOSIS — Z992 Dependence on renal dialysis: Secondary | ICD-10-CM | POA: Diagnosis not present

## 2017-06-20 DIAGNOSIS — Z992 Dependence on renal dialysis: Secondary | ICD-10-CM | POA: Diagnosis not present

## 2017-06-20 DIAGNOSIS — Z23 Encounter for immunization: Secondary | ICD-10-CM | POA: Diagnosis not present

## 2017-06-20 DIAGNOSIS — N186 End stage renal disease: Secondary | ICD-10-CM | POA: Diagnosis not present

## 2017-06-23 DIAGNOSIS — N186 End stage renal disease: Secondary | ICD-10-CM | POA: Diagnosis not present

## 2017-06-23 DIAGNOSIS — Z992 Dependence on renal dialysis: Secondary | ICD-10-CM | POA: Diagnosis not present

## 2017-06-23 DIAGNOSIS — Z23 Encounter for immunization: Secondary | ICD-10-CM | POA: Diagnosis not present

## 2017-06-25 DIAGNOSIS — Z23 Encounter for immunization: Secondary | ICD-10-CM | POA: Diagnosis not present

## 2017-06-25 DIAGNOSIS — Z992 Dependence on renal dialysis: Secondary | ICD-10-CM | POA: Diagnosis not present

## 2017-06-25 DIAGNOSIS — N186 End stage renal disease: Secondary | ICD-10-CM | POA: Diagnosis not present

## 2017-06-27 DIAGNOSIS — Z992 Dependence on renal dialysis: Secondary | ICD-10-CM | POA: Diagnosis not present

## 2017-06-27 DIAGNOSIS — N186 End stage renal disease: Secondary | ICD-10-CM | POA: Diagnosis not present

## 2017-06-30 DIAGNOSIS — N186 End stage renal disease: Secondary | ICD-10-CM | POA: Diagnosis not present

## 2017-06-30 DIAGNOSIS — Z992 Dependence on renal dialysis: Secondary | ICD-10-CM | POA: Diagnosis not present

## 2017-06-30 DIAGNOSIS — Z23 Encounter for immunization: Secondary | ICD-10-CM | POA: Diagnosis not present

## 2017-07-02 DIAGNOSIS — Z992 Dependence on renal dialysis: Secondary | ICD-10-CM | POA: Diagnosis not present

## 2017-07-02 DIAGNOSIS — N186 End stage renal disease: Secondary | ICD-10-CM | POA: Diagnosis not present

## 2017-07-02 DIAGNOSIS — Z23 Encounter for immunization: Secondary | ICD-10-CM | POA: Diagnosis not present

## 2017-07-04 DIAGNOSIS — N186 End stage renal disease: Secondary | ICD-10-CM | POA: Diagnosis not present

## 2017-07-04 DIAGNOSIS — Z23 Encounter for immunization: Secondary | ICD-10-CM | POA: Diagnosis not present

## 2017-07-04 DIAGNOSIS — Z992 Dependence on renal dialysis: Secondary | ICD-10-CM | POA: Diagnosis not present

## 2017-07-07 DIAGNOSIS — N186 End stage renal disease: Secondary | ICD-10-CM | POA: Diagnosis not present

## 2017-07-07 DIAGNOSIS — Z23 Encounter for immunization: Secondary | ICD-10-CM | POA: Diagnosis not present

## 2017-07-07 DIAGNOSIS — Z992 Dependence on renal dialysis: Secondary | ICD-10-CM | POA: Diagnosis not present

## 2017-07-09 DIAGNOSIS — Z23 Encounter for immunization: Secondary | ICD-10-CM | POA: Diagnosis not present

## 2017-07-09 DIAGNOSIS — Z992 Dependence on renal dialysis: Secondary | ICD-10-CM | POA: Diagnosis not present

## 2017-07-09 DIAGNOSIS — N186 End stage renal disease: Secondary | ICD-10-CM | POA: Diagnosis not present

## 2017-07-11 DIAGNOSIS — Z23 Encounter for immunization: Secondary | ICD-10-CM | POA: Diagnosis not present

## 2017-07-11 DIAGNOSIS — N186 End stage renal disease: Secondary | ICD-10-CM | POA: Diagnosis not present

## 2017-07-11 DIAGNOSIS — Z992 Dependence on renal dialysis: Secondary | ICD-10-CM | POA: Diagnosis not present

## 2017-07-14 DIAGNOSIS — N186 End stage renal disease: Secondary | ICD-10-CM | POA: Diagnosis not present

## 2017-07-14 DIAGNOSIS — Z23 Encounter for immunization: Secondary | ICD-10-CM | POA: Diagnosis not present

## 2017-07-14 DIAGNOSIS — Z992 Dependence on renal dialysis: Secondary | ICD-10-CM | POA: Diagnosis not present

## 2017-07-16 DIAGNOSIS — Z23 Encounter for immunization: Secondary | ICD-10-CM | POA: Diagnosis not present

## 2017-07-16 DIAGNOSIS — N186 End stage renal disease: Secondary | ICD-10-CM | POA: Diagnosis not present

## 2017-07-16 DIAGNOSIS — Z992 Dependence on renal dialysis: Secondary | ICD-10-CM | POA: Diagnosis not present

## 2017-07-18 DIAGNOSIS — N186 End stage renal disease: Secondary | ICD-10-CM | POA: Diagnosis not present

## 2017-07-18 DIAGNOSIS — Z992 Dependence on renal dialysis: Secondary | ICD-10-CM | POA: Diagnosis not present

## 2017-07-21 DIAGNOSIS — N186 End stage renal disease: Secondary | ICD-10-CM | POA: Diagnosis not present

## 2017-07-21 DIAGNOSIS — Z992 Dependence on renal dialysis: Secondary | ICD-10-CM | POA: Diagnosis not present

## 2017-07-23 DIAGNOSIS — Z992 Dependence on renal dialysis: Secondary | ICD-10-CM | POA: Diagnosis not present

## 2017-07-23 DIAGNOSIS — N186 End stage renal disease: Secondary | ICD-10-CM | POA: Diagnosis not present

## 2017-07-25 DIAGNOSIS — Z992 Dependence on renal dialysis: Secondary | ICD-10-CM | POA: Diagnosis not present

## 2017-07-25 DIAGNOSIS — N186 End stage renal disease: Secondary | ICD-10-CM | POA: Diagnosis not present

## 2017-07-28 DIAGNOSIS — Z992 Dependence on renal dialysis: Secondary | ICD-10-CM | POA: Diagnosis not present

## 2017-07-28 DIAGNOSIS — N186 End stage renal disease: Secondary | ICD-10-CM | POA: Diagnosis not present

## 2017-07-30 DIAGNOSIS — Z992 Dependence on renal dialysis: Secondary | ICD-10-CM | POA: Diagnosis not present

## 2017-07-30 DIAGNOSIS — N186 End stage renal disease: Secondary | ICD-10-CM | POA: Diagnosis not present

## 2017-08-01 DIAGNOSIS — Z992 Dependence on renal dialysis: Secondary | ICD-10-CM | POA: Diagnosis not present

## 2017-08-01 DIAGNOSIS — N186 End stage renal disease: Secondary | ICD-10-CM | POA: Diagnosis not present

## 2017-08-05 DIAGNOSIS — N186 End stage renal disease: Secondary | ICD-10-CM | POA: Diagnosis not present

## 2017-08-05 DIAGNOSIS — Z992 Dependence on renal dialysis: Secondary | ICD-10-CM | POA: Diagnosis not present

## 2017-08-08 DIAGNOSIS — N186 End stage renal disease: Secondary | ICD-10-CM | POA: Diagnosis not present

## 2017-08-08 DIAGNOSIS — Z992 Dependence on renal dialysis: Secondary | ICD-10-CM | POA: Diagnosis not present

## 2017-08-11 DIAGNOSIS — N186 End stage renal disease: Secondary | ICD-10-CM | POA: Diagnosis not present

## 2017-08-11 DIAGNOSIS — Z992 Dependence on renal dialysis: Secondary | ICD-10-CM | POA: Diagnosis not present

## 2017-08-13 DIAGNOSIS — Z992 Dependence on renal dialysis: Secondary | ICD-10-CM | POA: Diagnosis not present

## 2017-08-13 DIAGNOSIS — N186 End stage renal disease: Secondary | ICD-10-CM | POA: Diagnosis not present

## 2017-08-15 DIAGNOSIS — N186 End stage renal disease: Secondary | ICD-10-CM | POA: Diagnosis not present

## 2017-08-15 DIAGNOSIS — Z992 Dependence on renal dialysis: Secondary | ICD-10-CM | POA: Diagnosis not present

## 2017-08-18 DIAGNOSIS — Z992 Dependence on renal dialysis: Secondary | ICD-10-CM | POA: Diagnosis not present

## 2017-08-18 DIAGNOSIS — N186 End stage renal disease: Secondary | ICD-10-CM | POA: Diagnosis not present

## 2017-08-20 DIAGNOSIS — Z992 Dependence on renal dialysis: Secondary | ICD-10-CM | POA: Diagnosis not present

## 2017-08-20 DIAGNOSIS — N186 End stage renal disease: Secondary | ICD-10-CM | POA: Diagnosis not present

## 2017-08-22 DIAGNOSIS — N186 End stage renal disease: Secondary | ICD-10-CM | POA: Diagnosis not present

## 2017-08-22 DIAGNOSIS — Z992 Dependence on renal dialysis: Secondary | ICD-10-CM | POA: Diagnosis not present

## 2017-08-27 DIAGNOSIS — Z992 Dependence on renal dialysis: Secondary | ICD-10-CM | POA: Diagnosis not present

## 2017-08-27 DIAGNOSIS — N186 End stage renal disease: Secondary | ICD-10-CM | POA: Diagnosis not present

## 2017-08-29 DIAGNOSIS — Z992 Dependence on renal dialysis: Secondary | ICD-10-CM | POA: Diagnosis not present

## 2017-08-29 DIAGNOSIS — N186 End stage renal disease: Secondary | ICD-10-CM | POA: Diagnosis not present

## 2017-09-01 DIAGNOSIS — Z992 Dependence on renal dialysis: Secondary | ICD-10-CM | POA: Diagnosis not present

## 2017-09-01 DIAGNOSIS — N186 End stage renal disease: Secondary | ICD-10-CM | POA: Diagnosis not present

## 2017-09-03 DIAGNOSIS — N186 End stage renal disease: Secondary | ICD-10-CM | POA: Diagnosis not present

## 2017-09-03 DIAGNOSIS — Z992 Dependence on renal dialysis: Secondary | ICD-10-CM | POA: Diagnosis not present

## 2017-09-05 DIAGNOSIS — N186 End stage renal disease: Secondary | ICD-10-CM | POA: Diagnosis not present

## 2017-09-05 DIAGNOSIS — Z992 Dependence on renal dialysis: Secondary | ICD-10-CM | POA: Diagnosis not present

## 2017-09-07 DIAGNOSIS — N186 End stage renal disease: Secondary | ICD-10-CM | POA: Diagnosis not present

## 2017-09-07 DIAGNOSIS — Z992 Dependence on renal dialysis: Secondary | ICD-10-CM | POA: Diagnosis not present

## 2017-09-10 DIAGNOSIS — Z992 Dependence on renal dialysis: Secondary | ICD-10-CM | POA: Diagnosis not present

## 2017-09-10 DIAGNOSIS — N186 End stage renal disease: Secondary | ICD-10-CM | POA: Diagnosis not present

## 2017-09-12 DIAGNOSIS — N186 End stage renal disease: Secondary | ICD-10-CM | POA: Diagnosis not present

## 2017-09-12 DIAGNOSIS — Z992 Dependence on renal dialysis: Secondary | ICD-10-CM | POA: Diagnosis not present

## 2017-09-14 DIAGNOSIS — N186 End stage renal disease: Secondary | ICD-10-CM | POA: Diagnosis not present

## 2017-09-14 DIAGNOSIS — Z992 Dependence on renal dialysis: Secondary | ICD-10-CM | POA: Diagnosis not present

## 2017-09-15 DIAGNOSIS — N186 End stage renal disease: Secondary | ICD-10-CM | POA: Diagnosis not present

## 2017-09-15 DIAGNOSIS — Z992 Dependence on renal dialysis: Secondary | ICD-10-CM | POA: Diagnosis not present

## 2017-09-17 DIAGNOSIS — N186 End stage renal disease: Secondary | ICD-10-CM | POA: Diagnosis not present

## 2017-09-17 DIAGNOSIS — Z992 Dependence on renal dialysis: Secondary | ICD-10-CM | POA: Diagnosis not present

## 2017-09-17 DIAGNOSIS — N2581 Secondary hyperparathyroidism of renal origin: Secondary | ICD-10-CM | POA: Diagnosis not present

## 2017-09-19 DIAGNOSIS — N186 End stage renal disease: Secondary | ICD-10-CM | POA: Diagnosis not present

## 2017-09-19 DIAGNOSIS — Z992 Dependence on renal dialysis: Secondary | ICD-10-CM | POA: Diagnosis not present

## 2017-09-19 DIAGNOSIS — N2581 Secondary hyperparathyroidism of renal origin: Secondary | ICD-10-CM | POA: Diagnosis not present

## 2017-09-22 DIAGNOSIS — Z992 Dependence on renal dialysis: Secondary | ICD-10-CM | POA: Diagnosis not present

## 2017-09-22 DIAGNOSIS — N186 End stage renal disease: Secondary | ICD-10-CM | POA: Diagnosis not present

## 2017-09-22 DIAGNOSIS — N2581 Secondary hyperparathyroidism of renal origin: Secondary | ICD-10-CM | POA: Diagnosis not present

## 2017-09-24 DIAGNOSIS — N2581 Secondary hyperparathyroidism of renal origin: Secondary | ICD-10-CM | POA: Diagnosis not present

## 2017-09-24 DIAGNOSIS — Z992 Dependence on renal dialysis: Secondary | ICD-10-CM | POA: Diagnosis not present

## 2017-09-24 DIAGNOSIS — N186 End stage renal disease: Secondary | ICD-10-CM | POA: Diagnosis not present

## 2017-09-26 DIAGNOSIS — N2581 Secondary hyperparathyroidism of renal origin: Secondary | ICD-10-CM | POA: Diagnosis not present

## 2017-09-26 DIAGNOSIS — N186 End stage renal disease: Secondary | ICD-10-CM | POA: Diagnosis not present

## 2017-09-26 DIAGNOSIS — Z992 Dependence on renal dialysis: Secondary | ICD-10-CM | POA: Diagnosis not present

## 2017-09-29 DIAGNOSIS — Z992 Dependence on renal dialysis: Secondary | ICD-10-CM | POA: Diagnosis not present

## 2017-09-29 DIAGNOSIS — N2581 Secondary hyperparathyroidism of renal origin: Secondary | ICD-10-CM | POA: Diagnosis not present

## 2017-09-29 DIAGNOSIS — N186 End stage renal disease: Secondary | ICD-10-CM | POA: Diagnosis not present

## 2017-10-01 DIAGNOSIS — N186 End stage renal disease: Secondary | ICD-10-CM | POA: Diagnosis not present

## 2017-10-01 DIAGNOSIS — Z992 Dependence on renal dialysis: Secondary | ICD-10-CM | POA: Diagnosis not present

## 2017-10-01 DIAGNOSIS — N2581 Secondary hyperparathyroidism of renal origin: Secondary | ICD-10-CM | POA: Diagnosis not present

## 2017-10-03 DIAGNOSIS — N186 End stage renal disease: Secondary | ICD-10-CM | POA: Diagnosis not present

## 2017-10-03 DIAGNOSIS — Z992 Dependence on renal dialysis: Secondary | ICD-10-CM | POA: Diagnosis not present

## 2017-10-03 DIAGNOSIS — N2581 Secondary hyperparathyroidism of renal origin: Secondary | ICD-10-CM | POA: Diagnosis not present

## 2017-10-06 DIAGNOSIS — N186 End stage renal disease: Secondary | ICD-10-CM | POA: Diagnosis not present

## 2017-10-06 DIAGNOSIS — N2581 Secondary hyperparathyroidism of renal origin: Secondary | ICD-10-CM | POA: Diagnosis not present

## 2017-10-06 DIAGNOSIS — Z992 Dependence on renal dialysis: Secondary | ICD-10-CM | POA: Diagnosis not present

## 2017-10-10 DIAGNOSIS — N186 End stage renal disease: Secondary | ICD-10-CM | POA: Diagnosis not present

## 2017-10-10 DIAGNOSIS — Z992 Dependence on renal dialysis: Secondary | ICD-10-CM | POA: Diagnosis not present

## 2017-10-10 DIAGNOSIS — N2581 Secondary hyperparathyroidism of renal origin: Secondary | ICD-10-CM | POA: Diagnosis not present

## 2017-10-13 DIAGNOSIS — N2581 Secondary hyperparathyroidism of renal origin: Secondary | ICD-10-CM | POA: Diagnosis not present

## 2017-10-13 DIAGNOSIS — N186 End stage renal disease: Secondary | ICD-10-CM | POA: Diagnosis not present

## 2017-10-13 DIAGNOSIS — Z992 Dependence on renal dialysis: Secondary | ICD-10-CM | POA: Diagnosis not present

## 2017-10-15 DIAGNOSIS — N186 End stage renal disease: Secondary | ICD-10-CM | POA: Diagnosis not present

## 2017-10-15 DIAGNOSIS — Z992 Dependence on renal dialysis: Secondary | ICD-10-CM | POA: Diagnosis not present

## 2017-10-15 DIAGNOSIS — N2581 Secondary hyperparathyroidism of renal origin: Secondary | ICD-10-CM | POA: Diagnosis not present

## 2017-10-16 DIAGNOSIS — N186 End stage renal disease: Secondary | ICD-10-CM | POA: Diagnosis not present

## 2017-10-16 DIAGNOSIS — Z992 Dependence on renal dialysis: Secondary | ICD-10-CM | POA: Diagnosis not present

## 2017-10-17 DIAGNOSIS — N2581 Secondary hyperparathyroidism of renal origin: Secondary | ICD-10-CM | POA: Diagnosis not present

## 2017-10-17 DIAGNOSIS — N186 End stage renal disease: Secondary | ICD-10-CM | POA: Diagnosis not present

## 2017-10-17 DIAGNOSIS — Z992 Dependence on renal dialysis: Secondary | ICD-10-CM | POA: Diagnosis not present

## 2017-10-20 DIAGNOSIS — Z992 Dependence on renal dialysis: Secondary | ICD-10-CM | POA: Diagnosis not present

## 2017-10-20 DIAGNOSIS — N186 End stage renal disease: Secondary | ICD-10-CM | POA: Diagnosis not present

## 2017-10-20 DIAGNOSIS — N2581 Secondary hyperparathyroidism of renal origin: Secondary | ICD-10-CM | POA: Diagnosis not present

## 2017-10-22 DIAGNOSIS — Z992 Dependence on renal dialysis: Secondary | ICD-10-CM | POA: Diagnosis not present

## 2017-10-22 DIAGNOSIS — N186 End stage renal disease: Secondary | ICD-10-CM | POA: Diagnosis not present

## 2017-10-22 DIAGNOSIS — N2581 Secondary hyperparathyroidism of renal origin: Secondary | ICD-10-CM | POA: Diagnosis not present

## 2017-10-24 DIAGNOSIS — Z992 Dependence on renal dialysis: Secondary | ICD-10-CM | POA: Diagnosis not present

## 2017-10-24 DIAGNOSIS — N2581 Secondary hyperparathyroidism of renal origin: Secondary | ICD-10-CM | POA: Diagnosis not present

## 2017-10-24 DIAGNOSIS — N186 End stage renal disease: Secondary | ICD-10-CM | POA: Diagnosis not present

## 2017-10-27 DIAGNOSIS — N2581 Secondary hyperparathyroidism of renal origin: Secondary | ICD-10-CM | POA: Diagnosis not present

## 2017-10-27 DIAGNOSIS — N186 End stage renal disease: Secondary | ICD-10-CM | POA: Diagnosis not present

## 2017-10-27 DIAGNOSIS — Z992 Dependence on renal dialysis: Secondary | ICD-10-CM | POA: Diagnosis not present

## 2017-10-29 DIAGNOSIS — N2581 Secondary hyperparathyroidism of renal origin: Secondary | ICD-10-CM | POA: Diagnosis not present

## 2017-10-29 DIAGNOSIS — Z992 Dependence on renal dialysis: Secondary | ICD-10-CM | POA: Diagnosis not present

## 2017-10-29 DIAGNOSIS — N186 End stage renal disease: Secondary | ICD-10-CM | POA: Diagnosis not present

## 2017-10-31 DIAGNOSIS — N2581 Secondary hyperparathyroidism of renal origin: Secondary | ICD-10-CM | POA: Diagnosis not present

## 2017-10-31 DIAGNOSIS — Z992 Dependence on renal dialysis: Secondary | ICD-10-CM | POA: Diagnosis not present

## 2017-10-31 DIAGNOSIS — N186 End stage renal disease: Secondary | ICD-10-CM | POA: Diagnosis not present

## 2017-11-03 DIAGNOSIS — Z992 Dependence on renal dialysis: Secondary | ICD-10-CM | POA: Diagnosis not present

## 2017-11-03 DIAGNOSIS — N2581 Secondary hyperparathyroidism of renal origin: Secondary | ICD-10-CM | POA: Diagnosis not present

## 2017-11-03 DIAGNOSIS — N186 End stage renal disease: Secondary | ICD-10-CM | POA: Diagnosis not present

## 2017-11-05 DIAGNOSIS — Z992 Dependence on renal dialysis: Secondary | ICD-10-CM | POA: Diagnosis not present

## 2017-11-05 DIAGNOSIS — N186 End stage renal disease: Secondary | ICD-10-CM | POA: Diagnosis not present

## 2017-11-05 DIAGNOSIS — N2581 Secondary hyperparathyroidism of renal origin: Secondary | ICD-10-CM | POA: Diagnosis not present

## 2017-11-07 DIAGNOSIS — N2581 Secondary hyperparathyroidism of renal origin: Secondary | ICD-10-CM | POA: Diagnosis not present

## 2017-11-07 DIAGNOSIS — N186 End stage renal disease: Secondary | ICD-10-CM | POA: Diagnosis not present

## 2017-11-07 DIAGNOSIS — Z992 Dependence on renal dialysis: Secondary | ICD-10-CM | POA: Diagnosis not present

## 2017-11-10 DIAGNOSIS — N186 End stage renal disease: Secondary | ICD-10-CM | POA: Diagnosis not present

## 2017-11-10 DIAGNOSIS — N2581 Secondary hyperparathyroidism of renal origin: Secondary | ICD-10-CM | POA: Diagnosis not present

## 2017-11-10 DIAGNOSIS — Z992 Dependence on renal dialysis: Secondary | ICD-10-CM | POA: Diagnosis not present

## 2017-11-12 DIAGNOSIS — N186 End stage renal disease: Secondary | ICD-10-CM | POA: Diagnosis not present

## 2017-11-12 DIAGNOSIS — N2581 Secondary hyperparathyroidism of renal origin: Secondary | ICD-10-CM | POA: Diagnosis not present

## 2017-11-12 DIAGNOSIS — Z992 Dependence on renal dialysis: Secondary | ICD-10-CM | POA: Diagnosis not present

## 2017-11-13 DIAGNOSIS — N186 End stage renal disease: Secondary | ICD-10-CM | POA: Diagnosis not present

## 2017-11-13 DIAGNOSIS — Z992 Dependence on renal dialysis: Secondary | ICD-10-CM | POA: Diagnosis not present

## 2017-11-14 DIAGNOSIS — Z992 Dependence on renal dialysis: Secondary | ICD-10-CM | POA: Diagnosis not present

## 2017-11-14 DIAGNOSIS — N186 End stage renal disease: Secondary | ICD-10-CM | POA: Diagnosis not present

## 2017-11-14 DIAGNOSIS — N2581 Secondary hyperparathyroidism of renal origin: Secondary | ICD-10-CM | POA: Diagnosis not present

## 2017-11-17 DIAGNOSIS — Z992 Dependence on renal dialysis: Secondary | ICD-10-CM | POA: Diagnosis not present

## 2017-11-17 DIAGNOSIS — N2581 Secondary hyperparathyroidism of renal origin: Secondary | ICD-10-CM | POA: Diagnosis not present

## 2017-11-17 DIAGNOSIS — N186 End stage renal disease: Secondary | ICD-10-CM | POA: Diagnosis not present

## 2017-11-19 DIAGNOSIS — N186 End stage renal disease: Secondary | ICD-10-CM | POA: Diagnosis not present

## 2017-11-19 DIAGNOSIS — N2581 Secondary hyperparathyroidism of renal origin: Secondary | ICD-10-CM | POA: Diagnosis not present

## 2017-11-19 DIAGNOSIS — Z992 Dependence on renal dialysis: Secondary | ICD-10-CM | POA: Diagnosis not present

## 2017-11-21 DIAGNOSIS — N186 End stage renal disease: Secondary | ICD-10-CM | POA: Diagnosis not present

## 2017-11-21 DIAGNOSIS — N2581 Secondary hyperparathyroidism of renal origin: Secondary | ICD-10-CM | POA: Diagnosis not present

## 2017-11-21 DIAGNOSIS — Z992 Dependence on renal dialysis: Secondary | ICD-10-CM | POA: Diagnosis not present

## 2017-11-24 DIAGNOSIS — Z992 Dependence on renal dialysis: Secondary | ICD-10-CM | POA: Diagnosis not present

## 2017-11-24 DIAGNOSIS — N186 End stage renal disease: Secondary | ICD-10-CM | POA: Diagnosis not present

## 2017-11-24 DIAGNOSIS — N2581 Secondary hyperparathyroidism of renal origin: Secondary | ICD-10-CM | POA: Diagnosis not present

## 2017-11-26 DIAGNOSIS — Z992 Dependence on renal dialysis: Secondary | ICD-10-CM | POA: Diagnosis not present

## 2017-11-26 DIAGNOSIS — N186 End stage renal disease: Secondary | ICD-10-CM | POA: Diagnosis not present

## 2017-11-26 DIAGNOSIS — N2581 Secondary hyperparathyroidism of renal origin: Secondary | ICD-10-CM | POA: Diagnosis not present

## 2017-11-28 DIAGNOSIS — N2581 Secondary hyperparathyroidism of renal origin: Secondary | ICD-10-CM | POA: Diagnosis not present

## 2017-11-28 DIAGNOSIS — Z992 Dependence on renal dialysis: Secondary | ICD-10-CM | POA: Diagnosis not present

## 2017-11-28 DIAGNOSIS — N186 End stage renal disease: Secondary | ICD-10-CM | POA: Diagnosis not present

## 2017-12-01 DIAGNOSIS — N2581 Secondary hyperparathyroidism of renal origin: Secondary | ICD-10-CM | POA: Diagnosis not present

## 2017-12-01 DIAGNOSIS — Z992 Dependence on renal dialysis: Secondary | ICD-10-CM | POA: Diagnosis not present

## 2017-12-01 DIAGNOSIS — N186 End stage renal disease: Secondary | ICD-10-CM | POA: Diagnosis not present

## 2017-12-03 DIAGNOSIS — N2581 Secondary hyperparathyroidism of renal origin: Secondary | ICD-10-CM | POA: Diagnosis not present

## 2017-12-03 DIAGNOSIS — N186 End stage renal disease: Secondary | ICD-10-CM | POA: Diagnosis not present

## 2017-12-03 DIAGNOSIS — Z992 Dependence on renal dialysis: Secondary | ICD-10-CM | POA: Diagnosis not present

## 2017-12-05 DIAGNOSIS — N2581 Secondary hyperparathyroidism of renal origin: Secondary | ICD-10-CM | POA: Diagnosis not present

## 2017-12-05 DIAGNOSIS — N186 End stage renal disease: Secondary | ICD-10-CM | POA: Diagnosis not present

## 2017-12-05 DIAGNOSIS — Z992 Dependence on renal dialysis: Secondary | ICD-10-CM | POA: Diagnosis not present

## 2017-12-08 DIAGNOSIS — N186 End stage renal disease: Secondary | ICD-10-CM | POA: Diagnosis not present

## 2017-12-08 DIAGNOSIS — Z992 Dependence on renal dialysis: Secondary | ICD-10-CM | POA: Diagnosis not present

## 2017-12-08 DIAGNOSIS — N2581 Secondary hyperparathyroidism of renal origin: Secondary | ICD-10-CM | POA: Diagnosis not present

## 2017-12-10 DIAGNOSIS — N186 End stage renal disease: Secondary | ICD-10-CM | POA: Diagnosis not present

## 2017-12-10 DIAGNOSIS — N2581 Secondary hyperparathyroidism of renal origin: Secondary | ICD-10-CM | POA: Diagnosis not present

## 2017-12-10 DIAGNOSIS — Z992 Dependence on renal dialysis: Secondary | ICD-10-CM | POA: Diagnosis not present

## 2017-12-12 DIAGNOSIS — N186 End stage renal disease: Secondary | ICD-10-CM | POA: Diagnosis not present

## 2017-12-12 DIAGNOSIS — N2581 Secondary hyperparathyroidism of renal origin: Secondary | ICD-10-CM | POA: Diagnosis not present

## 2017-12-12 DIAGNOSIS — Z992 Dependence on renal dialysis: Secondary | ICD-10-CM | POA: Diagnosis not present

## 2017-12-14 DIAGNOSIS — N186 End stage renal disease: Secondary | ICD-10-CM | POA: Diagnosis not present

## 2017-12-14 DIAGNOSIS — Z992 Dependence on renal dialysis: Secondary | ICD-10-CM | POA: Diagnosis not present

## 2017-12-15 DIAGNOSIS — Z992 Dependence on renal dialysis: Secondary | ICD-10-CM | POA: Diagnosis not present

## 2017-12-15 DIAGNOSIS — N2581 Secondary hyperparathyroidism of renal origin: Secondary | ICD-10-CM | POA: Diagnosis not present

## 2017-12-15 DIAGNOSIS — N186 End stage renal disease: Secondary | ICD-10-CM | POA: Diagnosis not present

## 2017-12-17 DIAGNOSIS — N2581 Secondary hyperparathyroidism of renal origin: Secondary | ICD-10-CM | POA: Diagnosis not present

## 2017-12-17 DIAGNOSIS — Z992 Dependence on renal dialysis: Secondary | ICD-10-CM | POA: Diagnosis not present

## 2017-12-17 DIAGNOSIS — N186 End stage renal disease: Secondary | ICD-10-CM | POA: Diagnosis not present

## 2017-12-19 DIAGNOSIS — N186 End stage renal disease: Secondary | ICD-10-CM | POA: Diagnosis not present

## 2017-12-19 DIAGNOSIS — N2581 Secondary hyperparathyroidism of renal origin: Secondary | ICD-10-CM | POA: Diagnosis not present

## 2017-12-19 DIAGNOSIS — Z992 Dependence on renal dialysis: Secondary | ICD-10-CM | POA: Diagnosis not present

## 2017-12-22 DIAGNOSIS — Z992 Dependence on renal dialysis: Secondary | ICD-10-CM | POA: Diagnosis not present

## 2017-12-22 DIAGNOSIS — N186 End stage renal disease: Secondary | ICD-10-CM | POA: Diagnosis not present

## 2017-12-22 DIAGNOSIS — N2581 Secondary hyperparathyroidism of renal origin: Secondary | ICD-10-CM | POA: Diagnosis not present

## 2017-12-24 DIAGNOSIS — N2581 Secondary hyperparathyroidism of renal origin: Secondary | ICD-10-CM | POA: Diagnosis not present

## 2017-12-24 DIAGNOSIS — N186 End stage renal disease: Secondary | ICD-10-CM | POA: Diagnosis not present

## 2017-12-24 DIAGNOSIS — Z992 Dependence on renal dialysis: Secondary | ICD-10-CM | POA: Diagnosis not present

## 2017-12-26 DIAGNOSIS — Z992 Dependence on renal dialysis: Secondary | ICD-10-CM | POA: Diagnosis not present

## 2017-12-26 DIAGNOSIS — N186 End stage renal disease: Secondary | ICD-10-CM | POA: Diagnosis not present

## 2017-12-26 DIAGNOSIS — N2581 Secondary hyperparathyroidism of renal origin: Secondary | ICD-10-CM | POA: Diagnosis not present

## 2017-12-29 DIAGNOSIS — Z992 Dependence on renal dialysis: Secondary | ICD-10-CM | POA: Diagnosis not present

## 2017-12-29 DIAGNOSIS — N2581 Secondary hyperparathyroidism of renal origin: Secondary | ICD-10-CM | POA: Diagnosis not present

## 2017-12-29 DIAGNOSIS — N186 End stage renal disease: Secondary | ICD-10-CM | POA: Diagnosis not present

## 2018-01-02 DIAGNOSIS — N2581 Secondary hyperparathyroidism of renal origin: Secondary | ICD-10-CM | POA: Diagnosis not present

## 2018-01-02 DIAGNOSIS — Z992 Dependence on renal dialysis: Secondary | ICD-10-CM | POA: Diagnosis not present

## 2018-01-02 DIAGNOSIS — N186 End stage renal disease: Secondary | ICD-10-CM | POA: Diagnosis not present

## 2018-01-06 DIAGNOSIS — Z992 Dependence on renal dialysis: Secondary | ICD-10-CM | POA: Diagnosis not present

## 2018-01-06 DIAGNOSIS — N186 End stage renal disease: Secondary | ICD-10-CM | POA: Diagnosis not present

## 2018-01-06 DIAGNOSIS — N2581 Secondary hyperparathyroidism of renal origin: Secondary | ICD-10-CM | POA: Diagnosis not present

## 2018-01-09 DIAGNOSIS — N2581 Secondary hyperparathyroidism of renal origin: Secondary | ICD-10-CM | POA: Diagnosis not present

## 2018-01-09 DIAGNOSIS — Z992 Dependence on renal dialysis: Secondary | ICD-10-CM | POA: Diagnosis not present

## 2018-01-09 DIAGNOSIS — N186 End stage renal disease: Secondary | ICD-10-CM | POA: Diagnosis not present

## 2018-01-13 DIAGNOSIS — N2581 Secondary hyperparathyroidism of renal origin: Secondary | ICD-10-CM | POA: Diagnosis not present

## 2018-01-13 DIAGNOSIS — N186 End stage renal disease: Secondary | ICD-10-CM | POA: Diagnosis not present

## 2018-01-13 DIAGNOSIS — Z992 Dependence on renal dialysis: Secondary | ICD-10-CM | POA: Diagnosis not present

## 2018-01-13 IMAGING — CR DG CHEST 2V
1 series · 2 of 2 positions shown · non-contrast
Comparison: No prior.

CLINICAL DATA: Dialysis.

EXAM:
CHEST  2 VIEW

[Series 1: dg chest 2 view · 0.14mm/px · 2 of 2 slices shown]
[im 1/2]
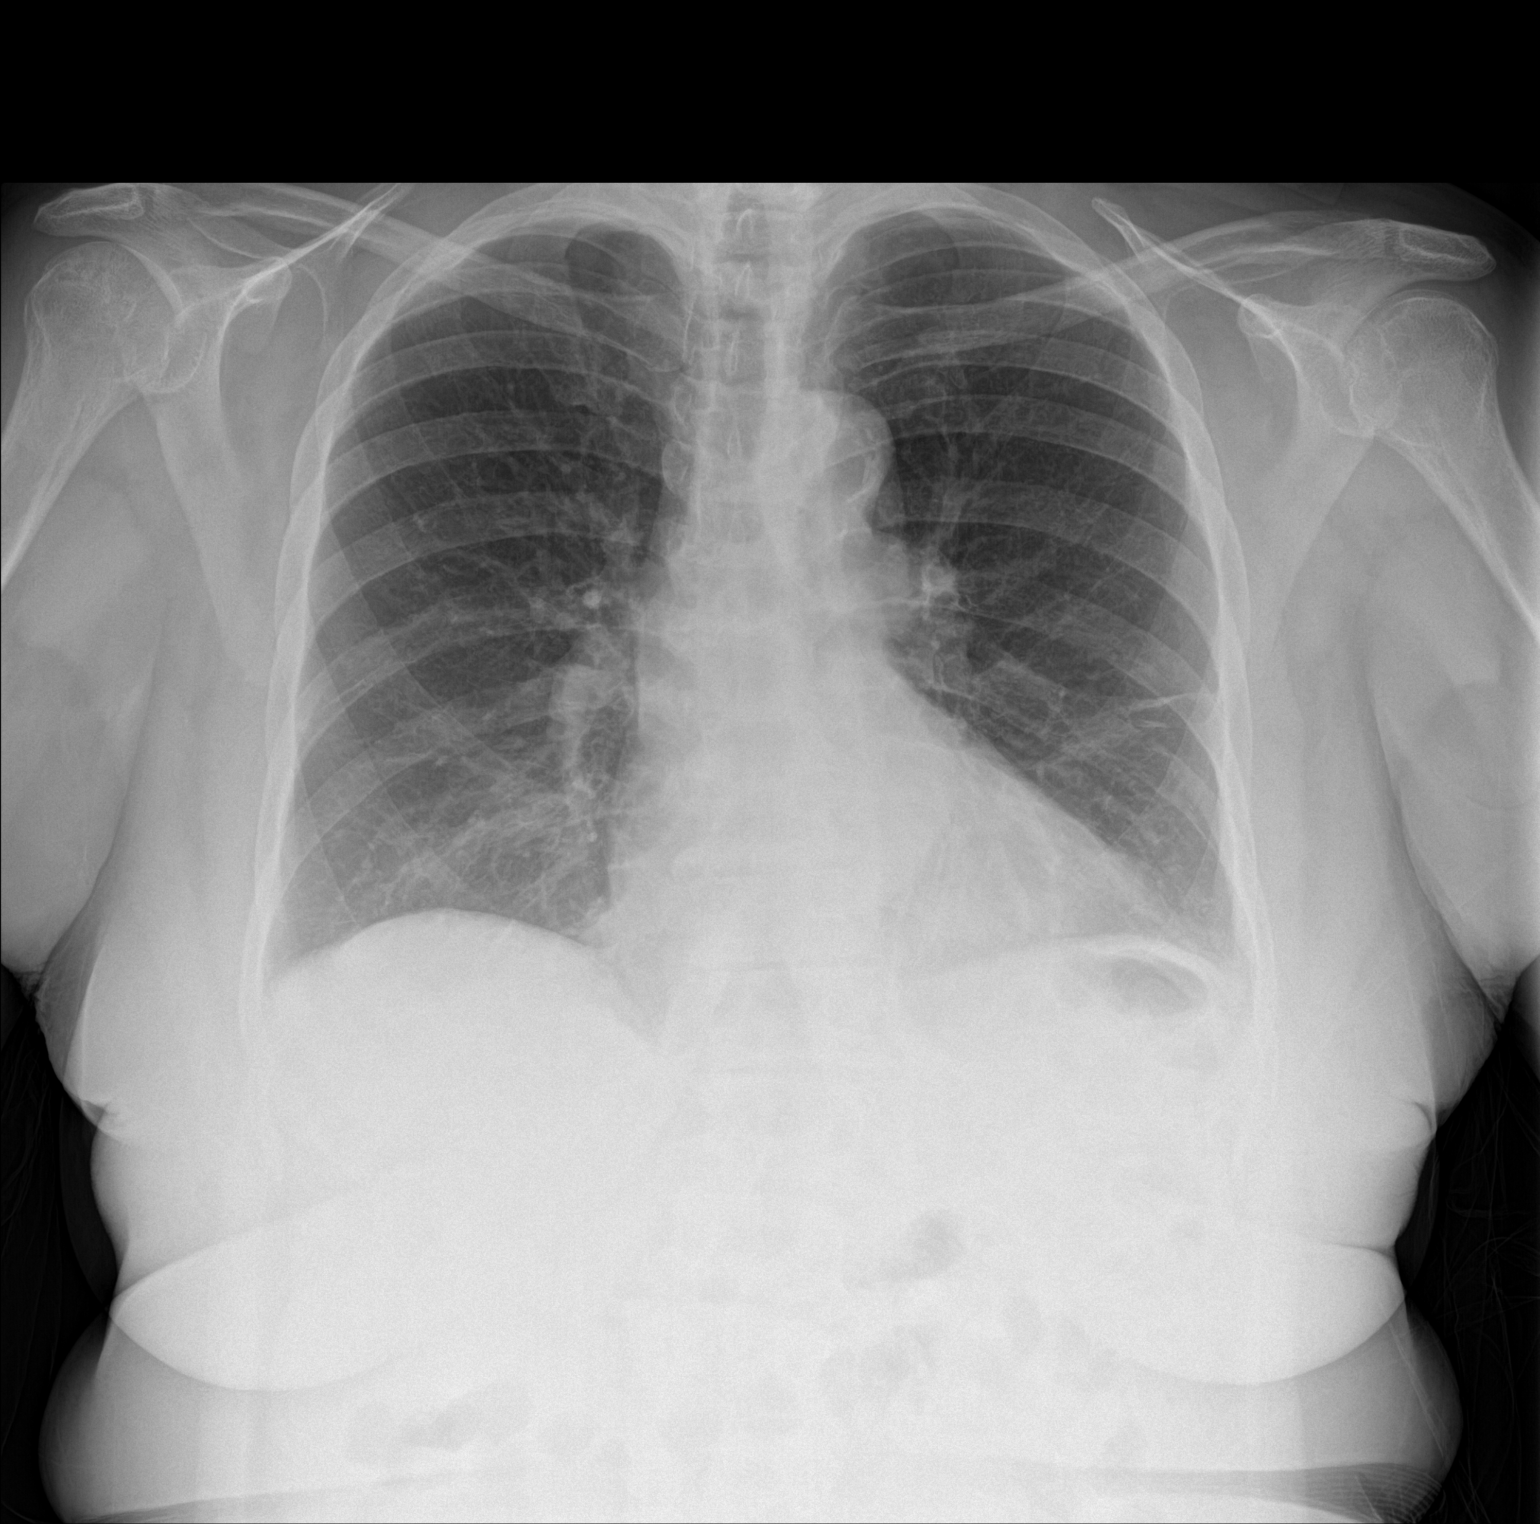
[im 2/2]
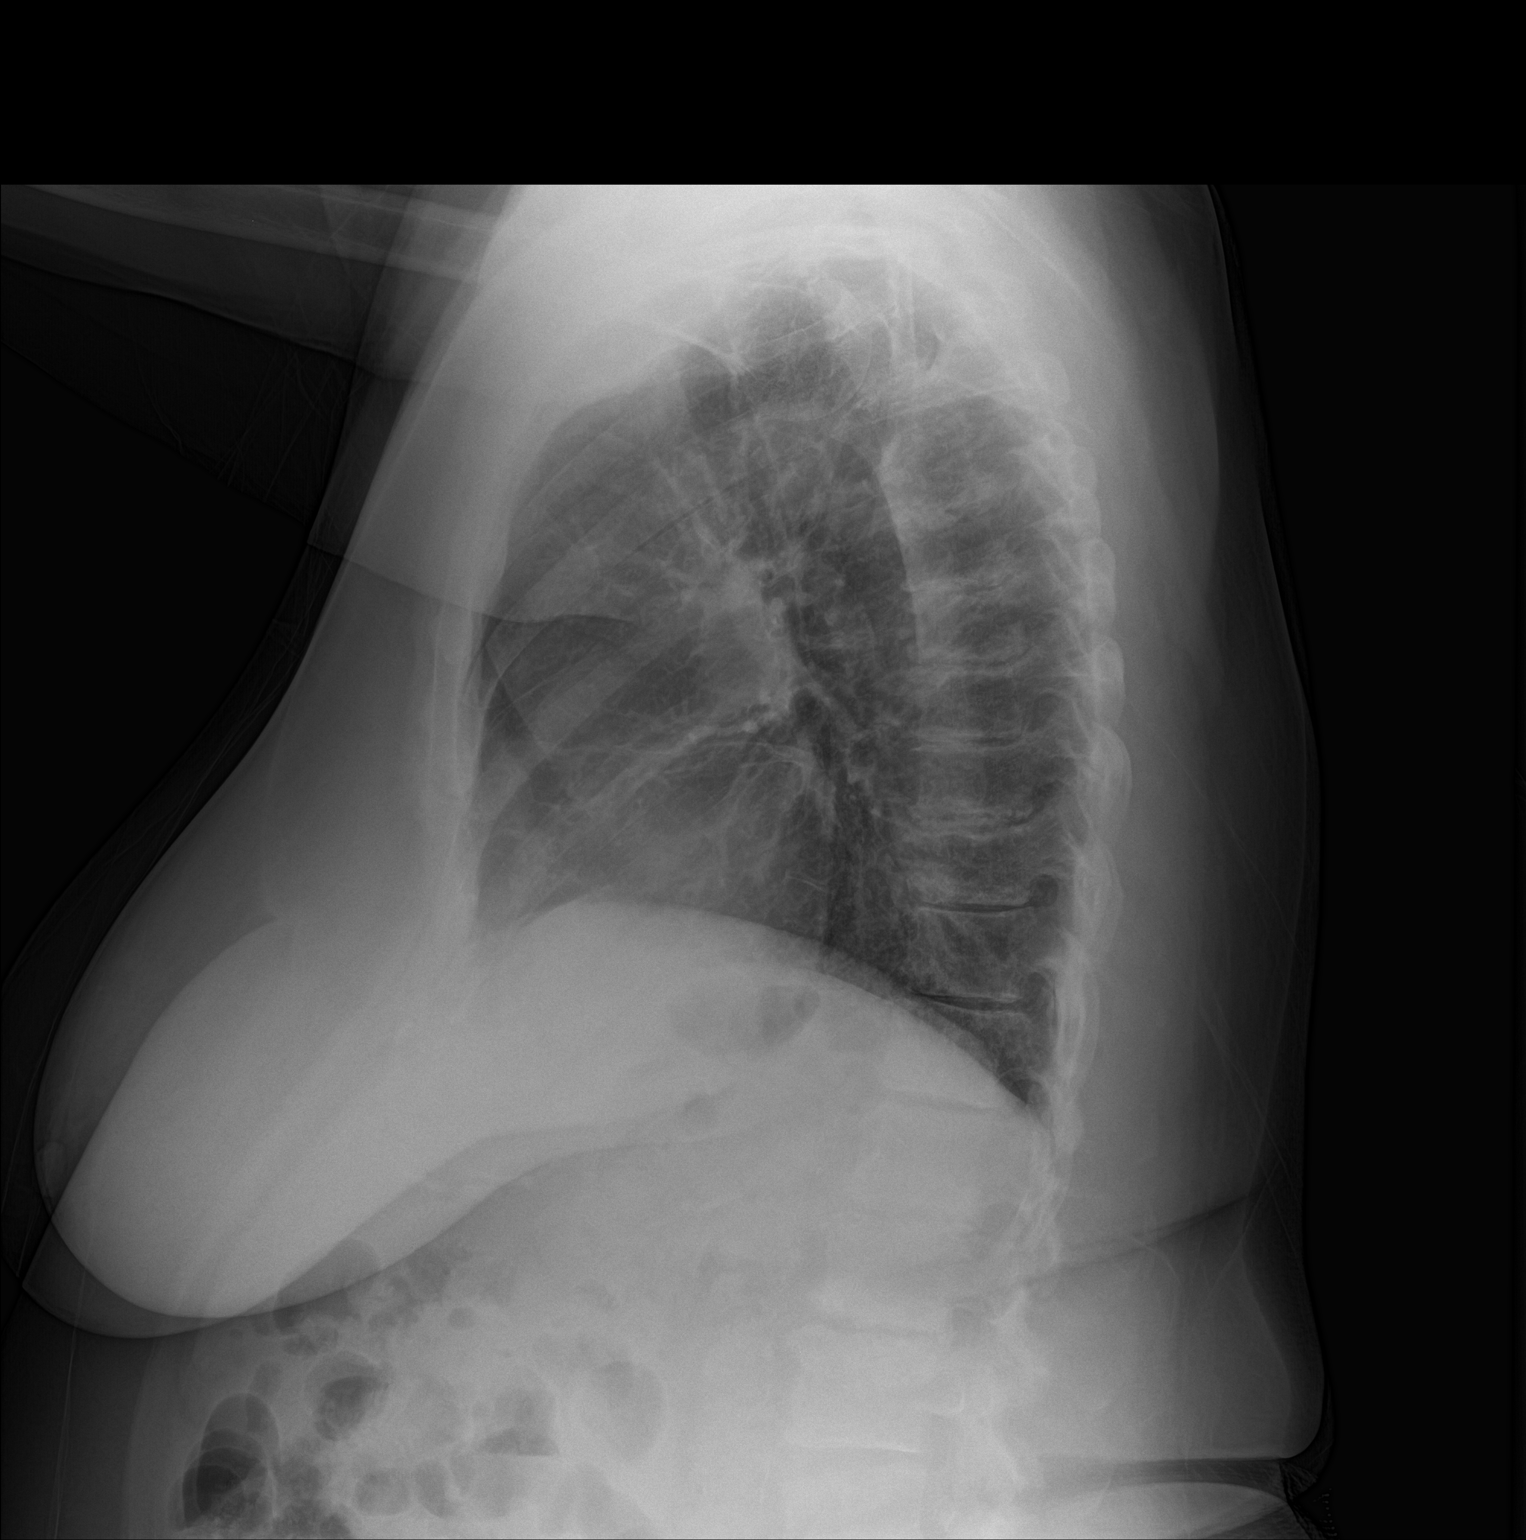

[2 of 2 positions shown; findings below may reference images not displayed]

FINDINGS: Mediastinum hilar structures normal. Heart size normal. Low lung
volumes with mild bibasilar atelectasis. No pleural effusion or
pneumothorax. Degenerative changes thoracic spine with scoliosis.
IMPRESSION: Low lung volumes with mild bibasilar atelectasis and or infiltrates.

## 2018-01-16 DIAGNOSIS — N186 End stage renal disease: Secondary | ICD-10-CM | POA: Diagnosis not present

## 2018-01-16 DIAGNOSIS — N2581 Secondary hyperparathyroidism of renal origin: Secondary | ICD-10-CM | POA: Diagnosis not present

## 2018-01-16 DIAGNOSIS — Z992 Dependence on renal dialysis: Secondary | ICD-10-CM | POA: Diagnosis not present

## 2018-01-20 DIAGNOSIS — N2581 Secondary hyperparathyroidism of renal origin: Secondary | ICD-10-CM | POA: Diagnosis not present

## 2018-01-20 DIAGNOSIS — Z992 Dependence on renal dialysis: Secondary | ICD-10-CM | POA: Diagnosis not present

## 2018-01-20 DIAGNOSIS — N186 End stage renal disease: Secondary | ICD-10-CM | POA: Diagnosis not present

## 2018-01-23 DIAGNOSIS — N2581 Secondary hyperparathyroidism of renal origin: Secondary | ICD-10-CM | POA: Diagnosis not present

## 2018-01-23 DIAGNOSIS — N186 End stage renal disease: Secondary | ICD-10-CM | POA: Diagnosis not present

## 2018-01-23 DIAGNOSIS — Z992 Dependence on renal dialysis: Secondary | ICD-10-CM | POA: Diagnosis not present

## 2018-01-27 DIAGNOSIS — Z992 Dependence on renal dialysis: Secondary | ICD-10-CM | POA: Diagnosis not present

## 2018-01-27 DIAGNOSIS — N186 End stage renal disease: Secondary | ICD-10-CM | POA: Diagnosis not present

## 2018-01-27 DIAGNOSIS — N2581 Secondary hyperparathyroidism of renal origin: Secondary | ICD-10-CM | POA: Diagnosis not present

## 2018-01-30 DIAGNOSIS — N2581 Secondary hyperparathyroidism of renal origin: Secondary | ICD-10-CM | POA: Diagnosis not present

## 2018-01-30 DIAGNOSIS — N186 End stage renal disease: Secondary | ICD-10-CM | POA: Diagnosis not present

## 2018-01-30 DIAGNOSIS — Z992 Dependence on renal dialysis: Secondary | ICD-10-CM | POA: Diagnosis not present

## 2018-02-03 DIAGNOSIS — N2581 Secondary hyperparathyroidism of renal origin: Secondary | ICD-10-CM | POA: Diagnosis not present

## 2018-02-03 DIAGNOSIS — Z992 Dependence on renal dialysis: Secondary | ICD-10-CM | POA: Diagnosis not present

## 2018-02-03 DIAGNOSIS — N186 End stage renal disease: Secondary | ICD-10-CM | POA: Diagnosis not present

## 2018-02-06 DIAGNOSIS — N2581 Secondary hyperparathyroidism of renal origin: Secondary | ICD-10-CM | POA: Diagnosis not present

## 2018-02-06 DIAGNOSIS — N186 End stage renal disease: Secondary | ICD-10-CM | POA: Diagnosis not present

## 2018-02-06 DIAGNOSIS — Z992 Dependence on renal dialysis: Secondary | ICD-10-CM | POA: Diagnosis not present

## 2018-02-10 DIAGNOSIS — N186 End stage renal disease: Secondary | ICD-10-CM | POA: Diagnosis not present

## 2018-02-10 DIAGNOSIS — N2581 Secondary hyperparathyroidism of renal origin: Secondary | ICD-10-CM | POA: Diagnosis not present

## 2018-02-10 DIAGNOSIS — Z992 Dependence on renal dialysis: Secondary | ICD-10-CM | POA: Diagnosis not present

## 2018-02-13 DIAGNOSIS — N186 End stage renal disease: Secondary | ICD-10-CM | POA: Diagnosis not present

## 2018-02-13 DIAGNOSIS — Z992 Dependence on renal dialysis: Secondary | ICD-10-CM | POA: Diagnosis not present

## 2018-02-14 DIAGNOSIS — N2581 Secondary hyperparathyroidism of renal origin: Secondary | ICD-10-CM | POA: Diagnosis not present

## 2018-02-14 DIAGNOSIS — Z992 Dependence on renal dialysis: Secondary | ICD-10-CM | POA: Diagnosis not present

## 2018-02-14 DIAGNOSIS — N186 End stage renal disease: Secondary | ICD-10-CM | POA: Diagnosis not present

## 2018-02-17 DIAGNOSIS — N2581 Secondary hyperparathyroidism of renal origin: Secondary | ICD-10-CM | POA: Diagnosis not present

## 2018-02-17 DIAGNOSIS — N186 End stage renal disease: Secondary | ICD-10-CM | POA: Diagnosis not present

## 2018-02-17 DIAGNOSIS — Z992 Dependence on renal dialysis: Secondary | ICD-10-CM | POA: Diagnosis not present

## 2018-02-20 DIAGNOSIS — N2581 Secondary hyperparathyroidism of renal origin: Secondary | ICD-10-CM | POA: Diagnosis not present

## 2018-02-20 DIAGNOSIS — Z992 Dependence on renal dialysis: Secondary | ICD-10-CM | POA: Diagnosis not present

## 2018-02-20 DIAGNOSIS — N186 End stage renal disease: Secondary | ICD-10-CM | POA: Diagnosis not present

## 2018-02-24 DIAGNOSIS — Z992 Dependence on renal dialysis: Secondary | ICD-10-CM | POA: Diagnosis not present

## 2018-02-24 DIAGNOSIS — N2581 Secondary hyperparathyroidism of renal origin: Secondary | ICD-10-CM | POA: Diagnosis not present

## 2018-02-24 DIAGNOSIS — N186 End stage renal disease: Secondary | ICD-10-CM | POA: Diagnosis not present

## 2018-02-26 DIAGNOSIS — N186 End stage renal disease: Secondary | ICD-10-CM | POA: Diagnosis not present

## 2018-02-26 DIAGNOSIS — Z992 Dependence on renal dialysis: Secondary | ICD-10-CM | POA: Diagnosis not present

## 2018-02-26 DIAGNOSIS — N2581 Secondary hyperparathyroidism of renal origin: Secondary | ICD-10-CM | POA: Diagnosis not present

## 2018-03-03 DIAGNOSIS — Z992 Dependence on renal dialysis: Secondary | ICD-10-CM | POA: Diagnosis not present

## 2018-03-03 DIAGNOSIS — N186 End stage renal disease: Secondary | ICD-10-CM | POA: Diagnosis not present

## 2018-03-03 DIAGNOSIS — N2581 Secondary hyperparathyroidism of renal origin: Secondary | ICD-10-CM | POA: Diagnosis not present

## 2018-03-06 DIAGNOSIS — N186 End stage renal disease: Secondary | ICD-10-CM | POA: Diagnosis not present

## 2018-03-06 DIAGNOSIS — N2581 Secondary hyperparathyroidism of renal origin: Secondary | ICD-10-CM | POA: Diagnosis not present

## 2018-03-06 DIAGNOSIS — Z992 Dependence on renal dialysis: Secondary | ICD-10-CM | POA: Diagnosis not present

## 2018-03-10 DIAGNOSIS — Z992 Dependence on renal dialysis: Secondary | ICD-10-CM | POA: Diagnosis not present

## 2018-03-10 DIAGNOSIS — N186 End stage renal disease: Secondary | ICD-10-CM | POA: Diagnosis not present

## 2018-03-10 DIAGNOSIS — N2581 Secondary hyperparathyroidism of renal origin: Secondary | ICD-10-CM | POA: Diagnosis not present

## 2018-03-13 DIAGNOSIS — N2581 Secondary hyperparathyroidism of renal origin: Secondary | ICD-10-CM | POA: Diagnosis not present

## 2018-03-13 DIAGNOSIS — N186 End stage renal disease: Secondary | ICD-10-CM | POA: Diagnosis not present

## 2018-03-13 DIAGNOSIS — Z992 Dependence on renal dialysis: Secondary | ICD-10-CM | POA: Diagnosis not present

## 2018-03-15 DIAGNOSIS — N186 End stage renal disease: Secondary | ICD-10-CM | POA: Diagnosis not present

## 2018-03-15 DIAGNOSIS — Z992 Dependence on renal dialysis: Secondary | ICD-10-CM | POA: Diagnosis not present

## 2018-03-17 DIAGNOSIS — N186 End stage renal disease: Secondary | ICD-10-CM | POA: Diagnosis not present

## 2018-03-17 DIAGNOSIS — Z992 Dependence on renal dialysis: Secondary | ICD-10-CM | POA: Diagnosis not present

## 2018-03-17 DIAGNOSIS — N2581 Secondary hyperparathyroidism of renal origin: Secondary | ICD-10-CM | POA: Diagnosis not present

## 2018-03-20 DIAGNOSIS — N2581 Secondary hyperparathyroidism of renal origin: Secondary | ICD-10-CM | POA: Diagnosis not present

## 2018-03-20 DIAGNOSIS — N186 End stage renal disease: Secondary | ICD-10-CM | POA: Diagnosis not present

## 2018-03-20 DIAGNOSIS — Z992 Dependence on renal dialysis: Secondary | ICD-10-CM | POA: Diagnosis not present

## 2018-03-24 DIAGNOSIS — N2581 Secondary hyperparathyroidism of renal origin: Secondary | ICD-10-CM | POA: Diagnosis not present

## 2018-03-24 DIAGNOSIS — N186 End stage renal disease: Secondary | ICD-10-CM | POA: Diagnosis not present

## 2018-03-24 DIAGNOSIS — Z992 Dependence on renal dialysis: Secondary | ICD-10-CM | POA: Diagnosis not present

## 2018-03-27 DIAGNOSIS — N2581 Secondary hyperparathyroidism of renal origin: Secondary | ICD-10-CM | POA: Diagnosis not present

## 2018-03-27 DIAGNOSIS — Z992 Dependence on renal dialysis: Secondary | ICD-10-CM | POA: Diagnosis not present

## 2018-03-27 DIAGNOSIS — N186 End stage renal disease: Secondary | ICD-10-CM | POA: Diagnosis not present

## 2018-03-31 DIAGNOSIS — Z992 Dependence on renal dialysis: Secondary | ICD-10-CM | POA: Diagnosis not present

## 2018-03-31 DIAGNOSIS — N186 End stage renal disease: Secondary | ICD-10-CM | POA: Diagnosis not present

## 2018-03-31 DIAGNOSIS — N2581 Secondary hyperparathyroidism of renal origin: Secondary | ICD-10-CM | POA: Diagnosis not present

## 2018-04-03 DIAGNOSIS — Z992 Dependence on renal dialysis: Secondary | ICD-10-CM | POA: Diagnosis not present

## 2018-04-03 DIAGNOSIS — N186 End stage renal disease: Secondary | ICD-10-CM | POA: Diagnosis not present

## 2018-04-03 DIAGNOSIS — N2581 Secondary hyperparathyroidism of renal origin: Secondary | ICD-10-CM | POA: Diagnosis not present

## 2018-04-07 DIAGNOSIS — Z992 Dependence on renal dialysis: Secondary | ICD-10-CM | POA: Diagnosis not present

## 2018-04-07 DIAGNOSIS — N186 End stage renal disease: Secondary | ICD-10-CM | POA: Diagnosis not present

## 2018-04-07 DIAGNOSIS — N2581 Secondary hyperparathyroidism of renal origin: Secondary | ICD-10-CM | POA: Diagnosis not present

## 2018-04-10 DIAGNOSIS — N2581 Secondary hyperparathyroidism of renal origin: Secondary | ICD-10-CM | POA: Diagnosis not present

## 2018-04-10 DIAGNOSIS — Z992 Dependence on renal dialysis: Secondary | ICD-10-CM | POA: Diagnosis not present

## 2018-04-10 DIAGNOSIS — N186 End stage renal disease: Secondary | ICD-10-CM | POA: Diagnosis not present

## 2018-04-14 DIAGNOSIS — Z992 Dependence on renal dialysis: Secondary | ICD-10-CM | POA: Diagnosis not present

## 2018-04-14 DIAGNOSIS — N2581 Secondary hyperparathyroidism of renal origin: Secondary | ICD-10-CM | POA: Diagnosis not present

## 2018-04-14 DIAGNOSIS — N186 End stage renal disease: Secondary | ICD-10-CM | POA: Diagnosis not present

## 2018-04-15 DIAGNOSIS — N186 End stage renal disease: Secondary | ICD-10-CM | POA: Diagnosis not present

## 2018-04-15 DIAGNOSIS — Z992 Dependence on renal dialysis: Secondary | ICD-10-CM | POA: Diagnosis not present

## 2018-04-17 DIAGNOSIS — Z992 Dependence on renal dialysis: Secondary | ICD-10-CM | POA: Diagnosis not present

## 2018-04-17 DIAGNOSIS — N2581 Secondary hyperparathyroidism of renal origin: Secondary | ICD-10-CM | POA: Diagnosis not present

## 2018-04-17 DIAGNOSIS — N186 End stage renal disease: Secondary | ICD-10-CM | POA: Diagnosis not present

## 2018-04-21 DIAGNOSIS — N186 End stage renal disease: Secondary | ICD-10-CM | POA: Diagnosis not present

## 2018-04-21 DIAGNOSIS — N2581 Secondary hyperparathyroidism of renal origin: Secondary | ICD-10-CM | POA: Diagnosis not present

## 2018-04-21 DIAGNOSIS — Z992 Dependence on renal dialysis: Secondary | ICD-10-CM | POA: Diagnosis not present

## 2018-04-24 DIAGNOSIS — N186 End stage renal disease: Secondary | ICD-10-CM | POA: Diagnosis not present

## 2018-04-24 DIAGNOSIS — N2581 Secondary hyperparathyroidism of renal origin: Secondary | ICD-10-CM | POA: Diagnosis not present

## 2018-04-24 DIAGNOSIS — Z992 Dependence on renal dialysis: Secondary | ICD-10-CM | POA: Diagnosis not present

## 2018-04-28 DIAGNOSIS — N2581 Secondary hyperparathyroidism of renal origin: Secondary | ICD-10-CM | POA: Diagnosis not present

## 2018-04-28 DIAGNOSIS — N186 End stage renal disease: Secondary | ICD-10-CM | POA: Diagnosis not present

## 2018-04-28 DIAGNOSIS — Z992 Dependence on renal dialysis: Secondary | ICD-10-CM | POA: Diagnosis not present

## 2018-04-30 DIAGNOSIS — N2581 Secondary hyperparathyroidism of renal origin: Secondary | ICD-10-CM | POA: Diagnosis not present

## 2018-04-30 DIAGNOSIS — N186 End stage renal disease: Secondary | ICD-10-CM | POA: Diagnosis not present

## 2018-04-30 DIAGNOSIS — Z992 Dependence on renal dialysis: Secondary | ICD-10-CM | POA: Diagnosis not present

## 2018-05-05 DIAGNOSIS — N186 End stage renal disease: Secondary | ICD-10-CM | POA: Diagnosis not present

## 2018-05-05 DIAGNOSIS — Z992 Dependence on renal dialysis: Secondary | ICD-10-CM | POA: Diagnosis not present

## 2018-05-05 DIAGNOSIS — N2581 Secondary hyperparathyroidism of renal origin: Secondary | ICD-10-CM | POA: Diagnosis not present

## 2018-05-08 DIAGNOSIS — Z992 Dependence on renal dialysis: Secondary | ICD-10-CM | POA: Diagnosis not present

## 2018-05-08 DIAGNOSIS — N186 End stage renal disease: Secondary | ICD-10-CM | POA: Diagnosis not present

## 2018-05-08 DIAGNOSIS — N2581 Secondary hyperparathyroidism of renal origin: Secondary | ICD-10-CM | POA: Diagnosis not present

## 2018-05-12 DIAGNOSIS — N186 End stage renal disease: Secondary | ICD-10-CM | POA: Diagnosis not present

## 2018-05-12 DIAGNOSIS — Z992 Dependence on renal dialysis: Secondary | ICD-10-CM | POA: Diagnosis not present

## 2018-05-12 DIAGNOSIS — N2581 Secondary hyperparathyroidism of renal origin: Secondary | ICD-10-CM | POA: Diagnosis not present

## 2018-05-15 DIAGNOSIS — N2581 Secondary hyperparathyroidism of renal origin: Secondary | ICD-10-CM | POA: Diagnosis not present

## 2018-05-15 DIAGNOSIS — Z992 Dependence on renal dialysis: Secondary | ICD-10-CM | POA: Diagnosis not present

## 2018-05-15 DIAGNOSIS — N186 End stage renal disease: Secondary | ICD-10-CM | POA: Diagnosis not present

## 2018-05-16 DIAGNOSIS — Z992 Dependence on renal dialysis: Secondary | ICD-10-CM | POA: Diagnosis not present

## 2018-05-16 DIAGNOSIS — N186 End stage renal disease: Secondary | ICD-10-CM | POA: Diagnosis not present

## 2018-05-19 DIAGNOSIS — N2581 Secondary hyperparathyroidism of renal origin: Secondary | ICD-10-CM | POA: Diagnosis not present

## 2018-05-19 DIAGNOSIS — Z992 Dependence on renal dialysis: Secondary | ICD-10-CM | POA: Diagnosis not present

## 2018-05-19 DIAGNOSIS — N186 End stage renal disease: Secondary | ICD-10-CM | POA: Diagnosis not present

## 2018-05-22 DIAGNOSIS — Z992 Dependence on renal dialysis: Secondary | ICD-10-CM | POA: Diagnosis not present

## 2018-05-22 DIAGNOSIS — N2581 Secondary hyperparathyroidism of renal origin: Secondary | ICD-10-CM | POA: Diagnosis not present

## 2018-05-22 DIAGNOSIS — N186 End stage renal disease: Secondary | ICD-10-CM | POA: Diagnosis not present

## 2018-05-26 DIAGNOSIS — N186 End stage renal disease: Secondary | ICD-10-CM | POA: Diagnosis not present

## 2018-05-26 DIAGNOSIS — N2581 Secondary hyperparathyroidism of renal origin: Secondary | ICD-10-CM | POA: Diagnosis not present

## 2018-05-26 DIAGNOSIS — Z992 Dependence on renal dialysis: Secondary | ICD-10-CM | POA: Diagnosis not present

## 2018-05-29 DIAGNOSIS — Z992 Dependence on renal dialysis: Secondary | ICD-10-CM | POA: Diagnosis not present

## 2018-05-29 DIAGNOSIS — N2581 Secondary hyperparathyroidism of renal origin: Secondary | ICD-10-CM | POA: Diagnosis not present

## 2018-05-29 DIAGNOSIS — N186 End stage renal disease: Secondary | ICD-10-CM | POA: Diagnosis not present

## 2018-06-01 DIAGNOSIS — I871 Compression of vein: Secondary | ICD-10-CM | POA: Diagnosis not present

## 2018-06-01 DIAGNOSIS — T82858A Stenosis of vascular prosthetic devices, implants and grafts, initial encounter: Secondary | ICD-10-CM | POA: Diagnosis not present

## 2018-06-02 DIAGNOSIS — N186 End stage renal disease: Secondary | ICD-10-CM | POA: Diagnosis not present

## 2018-06-02 DIAGNOSIS — N2581 Secondary hyperparathyroidism of renal origin: Secondary | ICD-10-CM | POA: Diagnosis not present

## 2018-06-02 DIAGNOSIS — Z992 Dependence on renal dialysis: Secondary | ICD-10-CM | POA: Diagnosis not present

## 2018-06-05 DIAGNOSIS — N2581 Secondary hyperparathyroidism of renal origin: Secondary | ICD-10-CM | POA: Diagnosis not present

## 2018-06-05 DIAGNOSIS — Z992 Dependence on renal dialysis: Secondary | ICD-10-CM | POA: Diagnosis not present

## 2018-06-05 DIAGNOSIS — N186 End stage renal disease: Secondary | ICD-10-CM | POA: Diagnosis not present

## 2018-06-08 ENCOUNTER — Ambulatory Visit (INDEPENDENT_AMBULATORY_CARE_PROVIDER_SITE_OTHER): Payer: Medicare HMO | Admitting: Vascular Surgery

## 2018-06-08 ENCOUNTER — Encounter (INDEPENDENT_AMBULATORY_CARE_PROVIDER_SITE_OTHER): Payer: Medicare HMO

## 2018-06-09 DIAGNOSIS — Z992 Dependence on renal dialysis: Secondary | ICD-10-CM | POA: Diagnosis not present

## 2018-06-09 DIAGNOSIS — N2581 Secondary hyperparathyroidism of renal origin: Secondary | ICD-10-CM | POA: Diagnosis not present

## 2018-06-09 DIAGNOSIS — N186 End stage renal disease: Secondary | ICD-10-CM | POA: Diagnosis not present

## 2018-06-12 DIAGNOSIS — Z992 Dependence on renal dialysis: Secondary | ICD-10-CM | POA: Diagnosis not present

## 2018-06-12 DIAGNOSIS — N2581 Secondary hyperparathyroidism of renal origin: Secondary | ICD-10-CM | POA: Diagnosis not present

## 2018-06-12 DIAGNOSIS — N186 End stage renal disease: Secondary | ICD-10-CM | POA: Diagnosis not present

## 2018-06-15 DIAGNOSIS — Z992 Dependence on renal dialysis: Secondary | ICD-10-CM | POA: Diagnosis not present

## 2018-06-15 DIAGNOSIS — N186 End stage renal disease: Secondary | ICD-10-CM | POA: Diagnosis not present

## 2018-06-16 DIAGNOSIS — Z23 Encounter for immunization: Secondary | ICD-10-CM | POA: Diagnosis not present

## 2018-06-16 DIAGNOSIS — N186 End stage renal disease: Secondary | ICD-10-CM | POA: Diagnosis not present

## 2018-06-16 DIAGNOSIS — N2581 Secondary hyperparathyroidism of renal origin: Secondary | ICD-10-CM | POA: Diagnosis not present

## 2018-06-16 DIAGNOSIS — Z992 Dependence on renal dialysis: Secondary | ICD-10-CM | POA: Diagnosis not present

## 2018-06-19 DIAGNOSIS — Z992 Dependence on renal dialysis: Secondary | ICD-10-CM | POA: Diagnosis not present

## 2018-06-19 DIAGNOSIS — N2581 Secondary hyperparathyroidism of renal origin: Secondary | ICD-10-CM | POA: Diagnosis not present

## 2018-06-19 DIAGNOSIS — Z23 Encounter for immunization: Secondary | ICD-10-CM | POA: Diagnosis not present

## 2018-06-19 DIAGNOSIS — N186 End stage renal disease: Secondary | ICD-10-CM | POA: Diagnosis not present

## 2018-06-23 DIAGNOSIS — N2581 Secondary hyperparathyroidism of renal origin: Secondary | ICD-10-CM | POA: Diagnosis not present

## 2018-06-23 DIAGNOSIS — N186 End stage renal disease: Secondary | ICD-10-CM | POA: Diagnosis not present

## 2018-06-23 DIAGNOSIS — Z992 Dependence on renal dialysis: Secondary | ICD-10-CM | POA: Diagnosis not present

## 2018-06-23 DIAGNOSIS — Z23 Encounter for immunization: Secondary | ICD-10-CM | POA: Diagnosis not present

## 2018-06-26 DIAGNOSIS — N186 End stage renal disease: Secondary | ICD-10-CM | POA: Diagnosis not present

## 2018-06-26 DIAGNOSIS — N2581 Secondary hyperparathyroidism of renal origin: Secondary | ICD-10-CM | POA: Diagnosis not present

## 2018-06-26 DIAGNOSIS — Z992 Dependence on renal dialysis: Secondary | ICD-10-CM | POA: Diagnosis not present

## 2018-06-26 DIAGNOSIS — Z23 Encounter for immunization: Secondary | ICD-10-CM | POA: Diagnosis not present

## 2018-06-30 DIAGNOSIS — N186 End stage renal disease: Secondary | ICD-10-CM | POA: Diagnosis not present

## 2018-06-30 DIAGNOSIS — Z992 Dependence on renal dialysis: Secondary | ICD-10-CM | POA: Diagnosis not present

## 2018-06-30 DIAGNOSIS — N2581 Secondary hyperparathyroidism of renal origin: Secondary | ICD-10-CM | POA: Diagnosis not present

## 2018-06-30 DIAGNOSIS — Z23 Encounter for immunization: Secondary | ICD-10-CM | POA: Diagnosis not present

## 2018-07-03 DIAGNOSIS — Z23 Encounter for immunization: Secondary | ICD-10-CM | POA: Diagnosis not present

## 2018-07-03 DIAGNOSIS — Z992 Dependence on renal dialysis: Secondary | ICD-10-CM | POA: Diagnosis not present

## 2018-07-03 DIAGNOSIS — N2581 Secondary hyperparathyroidism of renal origin: Secondary | ICD-10-CM | POA: Diagnosis not present

## 2018-07-03 DIAGNOSIS — N186 End stage renal disease: Secondary | ICD-10-CM | POA: Diagnosis not present

## 2018-07-07 DIAGNOSIS — Z992 Dependence on renal dialysis: Secondary | ICD-10-CM | POA: Diagnosis not present

## 2018-07-07 DIAGNOSIS — N2581 Secondary hyperparathyroidism of renal origin: Secondary | ICD-10-CM | POA: Diagnosis not present

## 2018-07-07 DIAGNOSIS — N186 End stage renal disease: Secondary | ICD-10-CM | POA: Diagnosis not present

## 2018-07-07 DIAGNOSIS — Z23 Encounter for immunization: Secondary | ICD-10-CM | POA: Diagnosis not present

## 2018-07-10 DIAGNOSIS — Z23 Encounter for immunization: Secondary | ICD-10-CM | POA: Diagnosis not present

## 2018-07-10 DIAGNOSIS — Z992 Dependence on renal dialysis: Secondary | ICD-10-CM | POA: Diagnosis not present

## 2018-07-10 DIAGNOSIS — N186 End stage renal disease: Secondary | ICD-10-CM | POA: Diagnosis not present

## 2018-07-10 DIAGNOSIS — N2581 Secondary hyperparathyroidism of renal origin: Secondary | ICD-10-CM | POA: Diagnosis not present

## 2018-07-14 DIAGNOSIS — N2581 Secondary hyperparathyroidism of renal origin: Secondary | ICD-10-CM | POA: Diagnosis not present

## 2018-07-14 DIAGNOSIS — Z992 Dependence on renal dialysis: Secondary | ICD-10-CM | POA: Diagnosis not present

## 2018-07-14 DIAGNOSIS — Z23 Encounter for immunization: Secondary | ICD-10-CM | POA: Diagnosis not present

## 2018-07-14 DIAGNOSIS — N186 End stage renal disease: Secondary | ICD-10-CM | POA: Diagnosis not present

## 2018-07-16 DIAGNOSIS — N186 End stage renal disease: Secondary | ICD-10-CM | POA: Diagnosis not present

## 2018-07-16 DIAGNOSIS — Z992 Dependence on renal dialysis: Secondary | ICD-10-CM | POA: Diagnosis not present

## 2018-07-17 DIAGNOSIS — N186 End stage renal disease: Secondary | ICD-10-CM | POA: Diagnosis not present

## 2018-07-17 DIAGNOSIS — N2581 Secondary hyperparathyroidism of renal origin: Secondary | ICD-10-CM | POA: Diagnosis not present

## 2018-07-17 DIAGNOSIS — Z23 Encounter for immunization: Secondary | ICD-10-CM | POA: Diagnosis not present

## 2018-07-17 DIAGNOSIS — Z992 Dependence on renal dialysis: Secondary | ICD-10-CM | POA: Diagnosis not present

## 2018-07-21 DIAGNOSIS — N186 End stage renal disease: Secondary | ICD-10-CM | POA: Diagnosis not present

## 2018-07-21 DIAGNOSIS — Z23 Encounter for immunization: Secondary | ICD-10-CM | POA: Diagnosis not present

## 2018-07-21 DIAGNOSIS — Z992 Dependence on renal dialysis: Secondary | ICD-10-CM | POA: Diagnosis not present

## 2018-07-21 DIAGNOSIS — N2581 Secondary hyperparathyroidism of renal origin: Secondary | ICD-10-CM | POA: Diagnosis not present

## 2018-07-24 DIAGNOSIS — Z992 Dependence on renal dialysis: Secondary | ICD-10-CM | POA: Diagnosis not present

## 2018-07-24 DIAGNOSIS — N2581 Secondary hyperparathyroidism of renal origin: Secondary | ICD-10-CM | POA: Diagnosis not present

## 2018-07-24 DIAGNOSIS — N186 End stage renal disease: Secondary | ICD-10-CM | POA: Diagnosis not present

## 2018-07-24 DIAGNOSIS — Z23 Encounter for immunization: Secondary | ICD-10-CM | POA: Diagnosis not present

## 2018-07-28 DIAGNOSIS — Z23 Encounter for immunization: Secondary | ICD-10-CM | POA: Diagnosis not present

## 2018-07-28 DIAGNOSIS — N2581 Secondary hyperparathyroidism of renal origin: Secondary | ICD-10-CM | POA: Diagnosis not present

## 2018-07-28 DIAGNOSIS — Z992 Dependence on renal dialysis: Secondary | ICD-10-CM | POA: Diagnosis not present

## 2018-07-28 DIAGNOSIS — N186 End stage renal disease: Secondary | ICD-10-CM | POA: Diagnosis not present

## 2018-07-30 DIAGNOSIS — N2581 Secondary hyperparathyroidism of renal origin: Secondary | ICD-10-CM | POA: Diagnosis not present

## 2018-07-30 DIAGNOSIS — Z992 Dependence on renal dialysis: Secondary | ICD-10-CM | POA: Diagnosis not present

## 2018-07-30 DIAGNOSIS — N186 End stage renal disease: Secondary | ICD-10-CM | POA: Diagnosis not present

## 2018-07-30 DIAGNOSIS — Z23 Encounter for immunization: Secondary | ICD-10-CM | POA: Diagnosis not present

## 2018-08-04 DIAGNOSIS — N2581 Secondary hyperparathyroidism of renal origin: Secondary | ICD-10-CM | POA: Diagnosis not present

## 2018-08-04 DIAGNOSIS — N186 End stage renal disease: Secondary | ICD-10-CM | POA: Diagnosis not present

## 2018-08-04 DIAGNOSIS — Z23 Encounter for immunization: Secondary | ICD-10-CM | POA: Diagnosis not present

## 2018-08-04 DIAGNOSIS — Z992 Dependence on renal dialysis: Secondary | ICD-10-CM | POA: Diagnosis not present

## 2018-08-07 DIAGNOSIS — Z992 Dependence on renal dialysis: Secondary | ICD-10-CM | POA: Diagnosis not present

## 2018-08-07 DIAGNOSIS — N186 End stage renal disease: Secondary | ICD-10-CM | POA: Diagnosis not present

## 2018-08-07 DIAGNOSIS — Z23 Encounter for immunization: Secondary | ICD-10-CM | POA: Diagnosis not present

## 2018-08-07 DIAGNOSIS — N2581 Secondary hyperparathyroidism of renal origin: Secondary | ICD-10-CM | POA: Diagnosis not present

## 2018-08-11 DIAGNOSIS — Z992 Dependence on renal dialysis: Secondary | ICD-10-CM | POA: Diagnosis not present

## 2018-08-11 DIAGNOSIS — N2581 Secondary hyperparathyroidism of renal origin: Secondary | ICD-10-CM | POA: Diagnosis not present

## 2018-08-11 DIAGNOSIS — Z23 Encounter for immunization: Secondary | ICD-10-CM | POA: Diagnosis not present

## 2018-08-11 DIAGNOSIS — N186 End stage renal disease: Secondary | ICD-10-CM | POA: Diagnosis not present

## 2018-08-14 DIAGNOSIS — N2581 Secondary hyperparathyroidism of renal origin: Secondary | ICD-10-CM | POA: Diagnosis not present

## 2018-08-14 DIAGNOSIS — Z23 Encounter for immunization: Secondary | ICD-10-CM | POA: Diagnosis not present

## 2018-08-14 DIAGNOSIS — Z992 Dependence on renal dialysis: Secondary | ICD-10-CM | POA: Diagnosis not present

## 2018-08-14 DIAGNOSIS — N186 End stage renal disease: Secondary | ICD-10-CM | POA: Diagnosis not present

## 2018-08-15 DIAGNOSIS — Z992 Dependence on renal dialysis: Secondary | ICD-10-CM | POA: Diagnosis not present

## 2018-08-15 DIAGNOSIS — N186 End stage renal disease: Secondary | ICD-10-CM | POA: Diagnosis not present

## 2018-08-18 DIAGNOSIS — N2581 Secondary hyperparathyroidism of renal origin: Secondary | ICD-10-CM | POA: Diagnosis not present

## 2018-08-18 DIAGNOSIS — N186 End stage renal disease: Secondary | ICD-10-CM | POA: Diagnosis not present

## 2018-08-18 DIAGNOSIS — Z992 Dependence on renal dialysis: Secondary | ICD-10-CM | POA: Diagnosis not present

## 2018-08-21 DIAGNOSIS — N186 End stage renal disease: Secondary | ICD-10-CM | POA: Diagnosis not present

## 2018-08-21 DIAGNOSIS — N2581 Secondary hyperparathyroidism of renal origin: Secondary | ICD-10-CM | POA: Diagnosis not present

## 2018-08-21 DIAGNOSIS — Z992 Dependence on renal dialysis: Secondary | ICD-10-CM | POA: Diagnosis not present

## 2018-08-25 DIAGNOSIS — N2581 Secondary hyperparathyroidism of renal origin: Secondary | ICD-10-CM | POA: Diagnosis not present

## 2018-08-25 DIAGNOSIS — N186 End stage renal disease: Secondary | ICD-10-CM | POA: Diagnosis not present

## 2018-08-25 DIAGNOSIS — Z992 Dependence on renal dialysis: Secondary | ICD-10-CM | POA: Diagnosis not present

## 2018-08-28 DIAGNOSIS — Z992 Dependence on renal dialysis: Secondary | ICD-10-CM | POA: Diagnosis not present

## 2018-08-28 DIAGNOSIS — N2581 Secondary hyperparathyroidism of renal origin: Secondary | ICD-10-CM | POA: Diagnosis not present

## 2018-08-28 DIAGNOSIS — N186 End stage renal disease: Secondary | ICD-10-CM | POA: Diagnosis not present

## 2018-09-01 DIAGNOSIS — Z992 Dependence on renal dialysis: Secondary | ICD-10-CM | POA: Diagnosis not present

## 2018-09-01 DIAGNOSIS — N2581 Secondary hyperparathyroidism of renal origin: Secondary | ICD-10-CM | POA: Diagnosis not present

## 2018-09-01 DIAGNOSIS — N186 End stage renal disease: Secondary | ICD-10-CM | POA: Diagnosis not present

## 2018-09-04 DIAGNOSIS — Z992 Dependence on renal dialysis: Secondary | ICD-10-CM | POA: Diagnosis not present

## 2018-09-04 DIAGNOSIS — N186 End stage renal disease: Secondary | ICD-10-CM | POA: Diagnosis not present

## 2018-09-04 DIAGNOSIS — N2581 Secondary hyperparathyroidism of renal origin: Secondary | ICD-10-CM | POA: Diagnosis not present

## 2018-09-07 DIAGNOSIS — T82858A Stenosis of vascular prosthetic devices, implants and grafts, initial encounter: Secondary | ICD-10-CM | POA: Diagnosis not present

## 2018-09-07 DIAGNOSIS — I871 Compression of vein: Secondary | ICD-10-CM | POA: Diagnosis not present

## 2018-09-08 DIAGNOSIS — N186 End stage renal disease: Secondary | ICD-10-CM | POA: Diagnosis not present

## 2018-09-08 DIAGNOSIS — N2581 Secondary hyperparathyroidism of renal origin: Secondary | ICD-10-CM | POA: Diagnosis not present

## 2018-09-08 DIAGNOSIS — Z992 Dependence on renal dialysis: Secondary | ICD-10-CM | POA: Diagnosis not present

## 2018-09-11 DIAGNOSIS — N186 End stage renal disease: Secondary | ICD-10-CM | POA: Diagnosis not present

## 2018-09-11 DIAGNOSIS — Z992 Dependence on renal dialysis: Secondary | ICD-10-CM | POA: Diagnosis not present

## 2018-09-11 DIAGNOSIS — N2581 Secondary hyperparathyroidism of renal origin: Secondary | ICD-10-CM | POA: Diagnosis not present

## 2018-09-15 DIAGNOSIS — N186 End stage renal disease: Secondary | ICD-10-CM | POA: Diagnosis not present

## 2018-09-15 DIAGNOSIS — Z992 Dependence on renal dialysis: Secondary | ICD-10-CM | POA: Diagnosis not present

## 2018-09-15 DIAGNOSIS — N2581 Secondary hyperparathyroidism of renal origin: Secondary | ICD-10-CM | POA: Diagnosis not present

## 2019-02-15 DIAGNOSIS — N186 End stage renal disease: Secondary | ICD-10-CM | POA: Diagnosis not present

## 2019-02-15 DIAGNOSIS — N2581 Secondary hyperparathyroidism of renal origin: Secondary | ICD-10-CM | POA: Diagnosis not present

## 2019-02-15 DIAGNOSIS — Z992 Dependence on renal dialysis: Secondary | ICD-10-CM | POA: Diagnosis not present

## 2019-02-19 DIAGNOSIS — Z992 Dependence on renal dialysis: Secondary | ICD-10-CM | POA: Diagnosis not present

## 2019-02-19 DIAGNOSIS — N186 End stage renal disease: Secondary | ICD-10-CM | POA: Diagnosis not present

## 2019-02-19 DIAGNOSIS — N2581 Secondary hyperparathyroidism of renal origin: Secondary | ICD-10-CM | POA: Diagnosis not present

## 2019-02-22 DIAGNOSIS — Z992 Dependence on renal dialysis: Secondary | ICD-10-CM | POA: Diagnosis not present

## 2019-02-22 DIAGNOSIS — N2581 Secondary hyperparathyroidism of renal origin: Secondary | ICD-10-CM | POA: Diagnosis not present

## 2019-02-22 DIAGNOSIS — N186 End stage renal disease: Secondary | ICD-10-CM | POA: Diagnosis not present

## 2019-02-26 DIAGNOSIS — N186 End stage renal disease: Secondary | ICD-10-CM | POA: Diagnosis not present

## 2019-02-26 DIAGNOSIS — N2581 Secondary hyperparathyroidism of renal origin: Secondary | ICD-10-CM | POA: Diagnosis not present

## 2019-02-26 DIAGNOSIS — Z992 Dependence on renal dialysis: Secondary | ICD-10-CM | POA: Diagnosis not present

## 2019-03-01 DIAGNOSIS — Z992 Dependence on renal dialysis: Secondary | ICD-10-CM | POA: Diagnosis not present

## 2019-03-01 DIAGNOSIS — N186 End stage renal disease: Secondary | ICD-10-CM | POA: Diagnosis not present

## 2019-03-01 DIAGNOSIS — N2581 Secondary hyperparathyroidism of renal origin: Secondary | ICD-10-CM | POA: Diagnosis not present

## 2019-03-05 DIAGNOSIS — N2581 Secondary hyperparathyroidism of renal origin: Secondary | ICD-10-CM | POA: Diagnosis not present

## 2019-03-05 DIAGNOSIS — N186 End stage renal disease: Secondary | ICD-10-CM | POA: Diagnosis not present

## 2019-03-05 DIAGNOSIS — Z992 Dependence on renal dialysis: Secondary | ICD-10-CM | POA: Diagnosis not present

## 2019-03-08 DIAGNOSIS — Z992 Dependence on renal dialysis: Secondary | ICD-10-CM | POA: Diagnosis not present

## 2019-03-08 DIAGNOSIS — N2581 Secondary hyperparathyroidism of renal origin: Secondary | ICD-10-CM | POA: Diagnosis not present

## 2019-03-08 DIAGNOSIS — N186 End stage renal disease: Secondary | ICD-10-CM | POA: Diagnosis not present

## 2019-03-12 DIAGNOSIS — N186 End stage renal disease: Secondary | ICD-10-CM | POA: Diagnosis not present

## 2019-03-12 DIAGNOSIS — Z992 Dependence on renal dialysis: Secondary | ICD-10-CM | POA: Diagnosis not present

## 2019-03-12 DIAGNOSIS — N2581 Secondary hyperparathyroidism of renal origin: Secondary | ICD-10-CM | POA: Diagnosis not present

## 2019-03-15 DIAGNOSIS — N2581 Secondary hyperparathyroidism of renal origin: Secondary | ICD-10-CM | POA: Diagnosis not present

## 2019-03-15 DIAGNOSIS — N186 End stage renal disease: Secondary | ICD-10-CM | POA: Diagnosis not present

## 2019-03-15 DIAGNOSIS — Z992 Dependence on renal dialysis: Secondary | ICD-10-CM | POA: Diagnosis not present

## 2019-03-16 DIAGNOSIS — N186 End stage renal disease: Secondary | ICD-10-CM | POA: Diagnosis not present

## 2019-03-16 DIAGNOSIS — Z992 Dependence on renal dialysis: Secondary | ICD-10-CM | POA: Diagnosis not present

## 2019-03-19 DIAGNOSIS — N2581 Secondary hyperparathyroidism of renal origin: Secondary | ICD-10-CM | POA: Diagnosis not present

## 2019-03-19 DIAGNOSIS — Z992 Dependence on renal dialysis: Secondary | ICD-10-CM | POA: Diagnosis not present

## 2019-03-19 DIAGNOSIS — N186 End stage renal disease: Secondary | ICD-10-CM | POA: Diagnosis not present

## 2019-03-22 DIAGNOSIS — N2581 Secondary hyperparathyroidism of renal origin: Secondary | ICD-10-CM | POA: Diagnosis not present

## 2019-03-22 DIAGNOSIS — N186 End stage renal disease: Secondary | ICD-10-CM | POA: Diagnosis not present

## 2019-03-22 DIAGNOSIS — Z992 Dependence on renal dialysis: Secondary | ICD-10-CM | POA: Diagnosis not present

## 2019-03-26 DIAGNOSIS — Z992 Dependence on renal dialysis: Secondary | ICD-10-CM | POA: Diagnosis not present

## 2019-03-26 DIAGNOSIS — N2581 Secondary hyperparathyroidism of renal origin: Secondary | ICD-10-CM | POA: Diagnosis not present

## 2019-03-26 DIAGNOSIS — N186 End stage renal disease: Secondary | ICD-10-CM | POA: Diagnosis not present

## 2019-03-29 DIAGNOSIS — N2581 Secondary hyperparathyroidism of renal origin: Secondary | ICD-10-CM | POA: Diagnosis not present

## 2019-03-29 DIAGNOSIS — Z992 Dependence on renal dialysis: Secondary | ICD-10-CM | POA: Diagnosis not present

## 2019-03-29 DIAGNOSIS — N186 End stage renal disease: Secondary | ICD-10-CM | POA: Diagnosis not present

## 2019-04-02 DIAGNOSIS — N186 End stage renal disease: Secondary | ICD-10-CM | POA: Diagnosis not present

## 2019-04-02 DIAGNOSIS — Z992 Dependence on renal dialysis: Secondary | ICD-10-CM | POA: Diagnosis not present

## 2019-04-02 DIAGNOSIS — N2581 Secondary hyperparathyroidism of renal origin: Secondary | ICD-10-CM | POA: Diagnosis not present

## 2019-04-05 DIAGNOSIS — N2581 Secondary hyperparathyroidism of renal origin: Secondary | ICD-10-CM | POA: Diagnosis not present

## 2019-04-05 DIAGNOSIS — N186 End stage renal disease: Secondary | ICD-10-CM | POA: Diagnosis not present

## 2019-04-05 DIAGNOSIS — Z992 Dependence on renal dialysis: Secondary | ICD-10-CM | POA: Diagnosis not present

## 2019-04-09 DIAGNOSIS — Z992 Dependence on renal dialysis: Secondary | ICD-10-CM | POA: Diagnosis not present

## 2019-04-09 DIAGNOSIS — N186 End stage renal disease: Secondary | ICD-10-CM | POA: Diagnosis not present

## 2019-04-09 DIAGNOSIS — N2581 Secondary hyperparathyroidism of renal origin: Secondary | ICD-10-CM | POA: Diagnosis not present

## 2019-04-12 DIAGNOSIS — Z992 Dependence on renal dialysis: Secondary | ICD-10-CM | POA: Diagnosis not present

## 2019-04-12 DIAGNOSIS — N186 End stage renal disease: Secondary | ICD-10-CM | POA: Diagnosis not present

## 2019-04-12 DIAGNOSIS — N2581 Secondary hyperparathyroidism of renal origin: Secondary | ICD-10-CM | POA: Diagnosis not present

## 2019-04-16 DIAGNOSIS — Z992 Dependence on renal dialysis: Secondary | ICD-10-CM | POA: Diagnosis not present

## 2019-04-16 DIAGNOSIS — N186 End stage renal disease: Secondary | ICD-10-CM | POA: Diagnosis not present

## 2019-04-16 DIAGNOSIS — N2581 Secondary hyperparathyroidism of renal origin: Secondary | ICD-10-CM | POA: Diagnosis not present

## 2019-04-19 DIAGNOSIS — N186 End stage renal disease: Secondary | ICD-10-CM | POA: Diagnosis not present

## 2019-04-19 DIAGNOSIS — N2581 Secondary hyperparathyroidism of renal origin: Secondary | ICD-10-CM | POA: Diagnosis not present

## 2019-04-19 DIAGNOSIS — Z992 Dependence on renal dialysis: Secondary | ICD-10-CM | POA: Diagnosis not present

## 2019-04-23 DIAGNOSIS — Z992 Dependence on renal dialysis: Secondary | ICD-10-CM | POA: Diagnosis not present

## 2019-04-23 DIAGNOSIS — N2581 Secondary hyperparathyroidism of renal origin: Secondary | ICD-10-CM | POA: Diagnosis not present

## 2019-04-23 DIAGNOSIS — N186 End stage renal disease: Secondary | ICD-10-CM | POA: Diagnosis not present

## 2019-04-26 DIAGNOSIS — Z992 Dependence on renal dialysis: Secondary | ICD-10-CM | POA: Diagnosis not present

## 2019-04-26 DIAGNOSIS — N186 End stage renal disease: Secondary | ICD-10-CM | POA: Diagnosis not present

## 2019-04-26 DIAGNOSIS — N2581 Secondary hyperparathyroidism of renal origin: Secondary | ICD-10-CM | POA: Diagnosis not present

## 2019-04-30 DIAGNOSIS — N186 End stage renal disease: Secondary | ICD-10-CM | POA: Diagnosis not present

## 2019-04-30 DIAGNOSIS — Z992 Dependence on renal dialysis: Secondary | ICD-10-CM | POA: Diagnosis not present

## 2019-04-30 DIAGNOSIS — N2581 Secondary hyperparathyroidism of renal origin: Secondary | ICD-10-CM | POA: Diagnosis not present

## 2019-05-03 DIAGNOSIS — N2581 Secondary hyperparathyroidism of renal origin: Secondary | ICD-10-CM | POA: Diagnosis not present

## 2019-05-03 DIAGNOSIS — N186 End stage renal disease: Secondary | ICD-10-CM | POA: Diagnosis not present

## 2019-05-03 DIAGNOSIS — Z992 Dependence on renal dialysis: Secondary | ICD-10-CM | POA: Diagnosis not present

## 2019-05-07 DIAGNOSIS — Z992 Dependence on renal dialysis: Secondary | ICD-10-CM | POA: Diagnosis not present

## 2019-05-07 DIAGNOSIS — N186 End stage renal disease: Secondary | ICD-10-CM | POA: Diagnosis not present

## 2019-05-07 DIAGNOSIS — N2581 Secondary hyperparathyroidism of renal origin: Secondary | ICD-10-CM | POA: Diagnosis not present

## 2019-05-10 DIAGNOSIS — Z992 Dependence on renal dialysis: Secondary | ICD-10-CM | POA: Diagnosis not present

## 2019-05-10 DIAGNOSIS — N2581 Secondary hyperparathyroidism of renal origin: Secondary | ICD-10-CM | POA: Diagnosis not present

## 2019-05-10 DIAGNOSIS — N186 End stage renal disease: Secondary | ICD-10-CM | POA: Diagnosis not present

## 2019-05-14 DIAGNOSIS — N186 End stage renal disease: Secondary | ICD-10-CM | POA: Diagnosis not present

## 2019-05-14 DIAGNOSIS — Z992 Dependence on renal dialysis: Secondary | ICD-10-CM | POA: Diagnosis not present

## 2019-05-14 DIAGNOSIS — N2581 Secondary hyperparathyroidism of renal origin: Secondary | ICD-10-CM | POA: Diagnosis not present

## 2019-05-17 DIAGNOSIS — N2581 Secondary hyperparathyroidism of renal origin: Secondary | ICD-10-CM | POA: Diagnosis not present

## 2019-05-17 DIAGNOSIS — N186 End stage renal disease: Secondary | ICD-10-CM | POA: Diagnosis not present

## 2019-05-17 DIAGNOSIS — Z992 Dependence on renal dialysis: Secondary | ICD-10-CM | POA: Diagnosis not present

## 2019-05-21 DIAGNOSIS — Z23 Encounter for immunization: Secondary | ICD-10-CM | POA: Diagnosis not present

## 2019-05-21 DIAGNOSIS — Z992 Dependence on renal dialysis: Secondary | ICD-10-CM | POA: Diagnosis not present

## 2019-05-21 DIAGNOSIS — N186 End stage renal disease: Secondary | ICD-10-CM | POA: Diagnosis not present

## 2019-05-21 DIAGNOSIS — N2581 Secondary hyperparathyroidism of renal origin: Secondary | ICD-10-CM | POA: Diagnosis not present

## 2019-05-24 DIAGNOSIS — Z23 Encounter for immunization: Secondary | ICD-10-CM | POA: Diagnosis not present

## 2019-05-24 DIAGNOSIS — Z992 Dependence on renal dialysis: Secondary | ICD-10-CM | POA: Diagnosis not present

## 2019-05-24 DIAGNOSIS — N186 End stage renal disease: Secondary | ICD-10-CM | POA: Diagnosis not present

## 2019-05-24 DIAGNOSIS — N2581 Secondary hyperparathyroidism of renal origin: Secondary | ICD-10-CM | POA: Diagnosis not present

## 2019-05-28 DIAGNOSIS — Z23 Encounter for immunization: Secondary | ICD-10-CM | POA: Diagnosis not present

## 2019-05-28 DIAGNOSIS — Z992 Dependence on renal dialysis: Secondary | ICD-10-CM | POA: Diagnosis not present

## 2019-05-28 DIAGNOSIS — N2581 Secondary hyperparathyroidism of renal origin: Secondary | ICD-10-CM | POA: Diagnosis not present

## 2019-05-28 DIAGNOSIS — N186 End stage renal disease: Secondary | ICD-10-CM | POA: Diagnosis not present

## 2019-05-31 DIAGNOSIS — Z992 Dependence on renal dialysis: Secondary | ICD-10-CM | POA: Diagnosis not present

## 2019-05-31 DIAGNOSIS — N186 End stage renal disease: Secondary | ICD-10-CM | POA: Diagnosis not present

## 2019-05-31 DIAGNOSIS — Z23 Encounter for immunization: Secondary | ICD-10-CM | POA: Diagnosis not present

## 2019-05-31 DIAGNOSIS — N2581 Secondary hyperparathyroidism of renal origin: Secondary | ICD-10-CM | POA: Diagnosis not present

## 2019-06-04 DIAGNOSIS — N186 End stage renal disease: Secondary | ICD-10-CM | POA: Diagnosis not present

## 2019-06-04 DIAGNOSIS — N2581 Secondary hyperparathyroidism of renal origin: Secondary | ICD-10-CM | POA: Diagnosis not present

## 2019-06-04 DIAGNOSIS — Z23 Encounter for immunization: Secondary | ICD-10-CM | POA: Diagnosis not present

## 2019-06-04 DIAGNOSIS — Z992 Dependence on renal dialysis: Secondary | ICD-10-CM | POA: Diagnosis not present

## 2019-06-07 DIAGNOSIS — N186 End stage renal disease: Secondary | ICD-10-CM | POA: Diagnosis not present

## 2019-06-07 DIAGNOSIS — Z992 Dependence on renal dialysis: Secondary | ICD-10-CM | POA: Diagnosis not present

## 2019-06-07 DIAGNOSIS — N2581 Secondary hyperparathyroidism of renal origin: Secondary | ICD-10-CM | POA: Diagnosis not present

## 2019-06-07 DIAGNOSIS — Z23 Encounter for immunization: Secondary | ICD-10-CM | POA: Diagnosis not present

## 2019-06-11 DIAGNOSIS — Z23 Encounter for immunization: Secondary | ICD-10-CM | POA: Diagnosis not present

## 2019-06-11 DIAGNOSIS — Z992 Dependence on renal dialysis: Secondary | ICD-10-CM | POA: Diagnosis not present

## 2019-06-11 DIAGNOSIS — N186 End stage renal disease: Secondary | ICD-10-CM | POA: Diagnosis not present

## 2019-06-11 DIAGNOSIS — N2581 Secondary hyperparathyroidism of renal origin: Secondary | ICD-10-CM | POA: Diagnosis not present

## 2019-06-14 DIAGNOSIS — N2581 Secondary hyperparathyroidism of renal origin: Secondary | ICD-10-CM | POA: Diagnosis not present

## 2019-06-14 DIAGNOSIS — Z992 Dependence on renal dialysis: Secondary | ICD-10-CM | POA: Diagnosis not present

## 2019-06-14 DIAGNOSIS — Z23 Encounter for immunization: Secondary | ICD-10-CM | POA: Diagnosis not present

## 2019-06-14 DIAGNOSIS — N186 End stage renal disease: Secondary | ICD-10-CM | POA: Diagnosis not present

## 2019-06-16 DIAGNOSIS — N186 End stage renal disease: Secondary | ICD-10-CM | POA: Diagnosis not present

## 2019-06-16 DIAGNOSIS — Z992 Dependence on renal dialysis: Secondary | ICD-10-CM | POA: Diagnosis not present

## 2019-06-18 DIAGNOSIS — N2581 Secondary hyperparathyroidism of renal origin: Secondary | ICD-10-CM | POA: Diagnosis not present

## 2019-06-18 DIAGNOSIS — Z992 Dependence on renal dialysis: Secondary | ICD-10-CM | POA: Diagnosis not present

## 2019-06-18 DIAGNOSIS — N186 End stage renal disease: Secondary | ICD-10-CM | POA: Diagnosis not present

## 2019-06-21 DIAGNOSIS — N186 End stage renal disease: Secondary | ICD-10-CM | POA: Diagnosis not present

## 2019-06-21 DIAGNOSIS — N2581 Secondary hyperparathyroidism of renal origin: Secondary | ICD-10-CM | POA: Diagnosis not present

## 2019-06-21 DIAGNOSIS — Z992 Dependence on renal dialysis: Secondary | ICD-10-CM | POA: Diagnosis not present

## 2019-06-25 DIAGNOSIS — Z992 Dependence on renal dialysis: Secondary | ICD-10-CM | POA: Diagnosis not present

## 2019-06-25 DIAGNOSIS — N2581 Secondary hyperparathyroidism of renal origin: Secondary | ICD-10-CM | POA: Diagnosis not present

## 2019-06-25 DIAGNOSIS — N186 End stage renal disease: Secondary | ICD-10-CM | POA: Diagnosis not present

## 2019-06-28 DIAGNOSIS — N186 End stage renal disease: Secondary | ICD-10-CM | POA: Diagnosis not present

## 2019-06-28 DIAGNOSIS — Z992 Dependence on renal dialysis: Secondary | ICD-10-CM | POA: Diagnosis not present

## 2019-06-28 DIAGNOSIS — N2581 Secondary hyperparathyroidism of renal origin: Secondary | ICD-10-CM | POA: Diagnosis not present

## 2019-07-02 DIAGNOSIS — Z992 Dependence on renal dialysis: Secondary | ICD-10-CM | POA: Diagnosis not present

## 2019-07-02 DIAGNOSIS — N186 End stage renal disease: Secondary | ICD-10-CM | POA: Diagnosis not present

## 2019-07-02 DIAGNOSIS — N2581 Secondary hyperparathyroidism of renal origin: Secondary | ICD-10-CM | POA: Diagnosis not present

## 2019-07-05 DIAGNOSIS — N186 End stage renal disease: Secondary | ICD-10-CM | POA: Diagnosis not present

## 2019-07-05 DIAGNOSIS — N2581 Secondary hyperparathyroidism of renal origin: Secondary | ICD-10-CM | POA: Diagnosis not present

## 2019-07-05 DIAGNOSIS — Z992 Dependence on renal dialysis: Secondary | ICD-10-CM | POA: Diagnosis not present

## 2019-07-09 DIAGNOSIS — N186 End stage renal disease: Secondary | ICD-10-CM | POA: Diagnosis not present

## 2019-07-09 DIAGNOSIS — Z992 Dependence on renal dialysis: Secondary | ICD-10-CM | POA: Diagnosis not present

## 2019-07-09 DIAGNOSIS — N2581 Secondary hyperparathyroidism of renal origin: Secondary | ICD-10-CM | POA: Diagnosis not present

## 2019-07-12 DIAGNOSIS — Z992 Dependence on renal dialysis: Secondary | ICD-10-CM | POA: Diagnosis not present

## 2019-07-12 DIAGNOSIS — N2581 Secondary hyperparathyroidism of renal origin: Secondary | ICD-10-CM | POA: Diagnosis not present

## 2019-07-12 DIAGNOSIS — N186 End stage renal disease: Secondary | ICD-10-CM | POA: Diagnosis not present

## 2019-07-16 DIAGNOSIS — Z992 Dependence on renal dialysis: Secondary | ICD-10-CM | POA: Diagnosis not present

## 2019-07-16 DIAGNOSIS — N2581 Secondary hyperparathyroidism of renal origin: Secondary | ICD-10-CM | POA: Diagnosis not present

## 2019-07-16 DIAGNOSIS — N186 End stage renal disease: Secondary | ICD-10-CM | POA: Diagnosis not present

## 2019-07-17 DIAGNOSIS — Z992 Dependence on renal dialysis: Secondary | ICD-10-CM | POA: Diagnosis not present

## 2019-07-17 DIAGNOSIS — N186 End stage renal disease: Secondary | ICD-10-CM | POA: Diagnosis not present

## 2019-07-19 DIAGNOSIS — N186 End stage renal disease: Secondary | ICD-10-CM | POA: Diagnosis not present

## 2019-07-19 DIAGNOSIS — N2581 Secondary hyperparathyroidism of renal origin: Secondary | ICD-10-CM | POA: Diagnosis not present

## 2019-07-19 DIAGNOSIS — Z992 Dependence on renal dialysis: Secondary | ICD-10-CM | POA: Diagnosis not present

## 2019-07-23 DIAGNOSIS — Z992 Dependence on renal dialysis: Secondary | ICD-10-CM | POA: Diagnosis not present

## 2019-07-23 DIAGNOSIS — N2581 Secondary hyperparathyroidism of renal origin: Secondary | ICD-10-CM | POA: Diagnosis not present

## 2019-07-23 DIAGNOSIS — N186 End stage renal disease: Secondary | ICD-10-CM | POA: Diagnosis not present

## 2019-07-26 DIAGNOSIS — N186 End stage renal disease: Secondary | ICD-10-CM | POA: Diagnosis not present

## 2019-07-26 DIAGNOSIS — Z992 Dependence on renal dialysis: Secondary | ICD-10-CM | POA: Diagnosis not present

## 2019-07-26 DIAGNOSIS — N2581 Secondary hyperparathyroidism of renal origin: Secondary | ICD-10-CM | POA: Diagnosis not present

## 2019-07-30 DIAGNOSIS — N186 End stage renal disease: Secondary | ICD-10-CM | POA: Diagnosis not present

## 2019-07-30 DIAGNOSIS — N2581 Secondary hyperparathyroidism of renal origin: Secondary | ICD-10-CM | POA: Diagnosis not present

## 2019-07-30 DIAGNOSIS — Z992 Dependence on renal dialysis: Secondary | ICD-10-CM | POA: Diagnosis not present

## 2019-08-02 DIAGNOSIS — N2581 Secondary hyperparathyroidism of renal origin: Secondary | ICD-10-CM | POA: Diagnosis not present

## 2019-08-02 DIAGNOSIS — N186 End stage renal disease: Secondary | ICD-10-CM | POA: Diagnosis not present

## 2019-08-02 DIAGNOSIS — Z992 Dependence on renal dialysis: Secondary | ICD-10-CM | POA: Diagnosis not present

## 2019-08-06 DIAGNOSIS — N2581 Secondary hyperparathyroidism of renal origin: Secondary | ICD-10-CM | POA: Diagnosis not present

## 2019-08-06 DIAGNOSIS — Z992 Dependence on renal dialysis: Secondary | ICD-10-CM | POA: Diagnosis not present

## 2019-08-06 DIAGNOSIS — N186 End stage renal disease: Secondary | ICD-10-CM | POA: Diagnosis not present

## 2019-08-09 DIAGNOSIS — Z992 Dependence on renal dialysis: Secondary | ICD-10-CM | POA: Diagnosis not present

## 2019-08-09 DIAGNOSIS — N2581 Secondary hyperparathyroidism of renal origin: Secondary | ICD-10-CM | POA: Diagnosis not present

## 2019-08-09 DIAGNOSIS — N186 End stage renal disease: Secondary | ICD-10-CM | POA: Diagnosis not present

## 2019-08-13 DIAGNOSIS — N186 End stage renal disease: Secondary | ICD-10-CM | POA: Diagnosis not present

## 2019-08-13 DIAGNOSIS — Z992 Dependence on renal dialysis: Secondary | ICD-10-CM | POA: Diagnosis not present

## 2019-08-13 DIAGNOSIS — N2581 Secondary hyperparathyroidism of renal origin: Secondary | ICD-10-CM | POA: Diagnosis not present

## 2019-08-16 DIAGNOSIS — N2581 Secondary hyperparathyroidism of renal origin: Secondary | ICD-10-CM | POA: Diagnosis not present

## 2019-08-16 DIAGNOSIS — Z992 Dependence on renal dialysis: Secondary | ICD-10-CM | POA: Diagnosis not present

## 2019-08-16 DIAGNOSIS — N186 End stage renal disease: Secondary | ICD-10-CM | POA: Diagnosis not present

## 2019-08-20 DIAGNOSIS — N2581 Secondary hyperparathyroidism of renal origin: Secondary | ICD-10-CM | POA: Diagnosis not present

## 2019-08-20 DIAGNOSIS — Z992 Dependence on renal dialysis: Secondary | ICD-10-CM | POA: Diagnosis not present

## 2019-08-20 DIAGNOSIS — N186 End stage renal disease: Secondary | ICD-10-CM | POA: Diagnosis not present

## 2019-08-23 DIAGNOSIS — Z992 Dependence on renal dialysis: Secondary | ICD-10-CM | POA: Diagnosis not present

## 2019-08-23 DIAGNOSIS — N186 End stage renal disease: Secondary | ICD-10-CM | POA: Diagnosis not present

## 2019-08-23 DIAGNOSIS — N2581 Secondary hyperparathyroidism of renal origin: Secondary | ICD-10-CM | POA: Diagnosis not present

## 2019-08-27 DIAGNOSIS — Z992 Dependence on renal dialysis: Secondary | ICD-10-CM | POA: Diagnosis not present

## 2019-08-27 DIAGNOSIS — N2581 Secondary hyperparathyroidism of renal origin: Secondary | ICD-10-CM | POA: Diagnosis not present

## 2019-08-27 DIAGNOSIS — N186 End stage renal disease: Secondary | ICD-10-CM | POA: Diagnosis not present

## 2019-08-30 DIAGNOSIS — N186 End stage renal disease: Secondary | ICD-10-CM | POA: Diagnosis not present

## 2019-08-30 DIAGNOSIS — N2581 Secondary hyperparathyroidism of renal origin: Secondary | ICD-10-CM | POA: Diagnosis not present

## 2019-08-30 DIAGNOSIS — Z992 Dependence on renal dialysis: Secondary | ICD-10-CM | POA: Diagnosis not present

## 2019-09-02 DIAGNOSIS — Z20828 Contact with and (suspected) exposure to other viral communicable diseases: Secondary | ICD-10-CM | POA: Diagnosis not present

## 2019-09-02 DIAGNOSIS — Z03818 Encounter for observation for suspected exposure to other biological agents ruled out: Secondary | ICD-10-CM | POA: Diagnosis not present

## 2019-09-03 DIAGNOSIS — N2581 Secondary hyperparathyroidism of renal origin: Secondary | ICD-10-CM | POA: Diagnosis not present

## 2019-09-03 DIAGNOSIS — Z992 Dependence on renal dialysis: Secondary | ICD-10-CM | POA: Diagnosis not present

## 2019-09-03 DIAGNOSIS — N186 End stage renal disease: Secondary | ICD-10-CM | POA: Diagnosis not present

## 2019-09-06 DIAGNOSIS — Z992 Dependence on renal dialysis: Secondary | ICD-10-CM | POA: Diagnosis not present

## 2019-09-06 DIAGNOSIS — N186 End stage renal disease: Secondary | ICD-10-CM | POA: Diagnosis not present

## 2019-09-06 DIAGNOSIS — N2581 Secondary hyperparathyroidism of renal origin: Secondary | ICD-10-CM | POA: Diagnosis not present

## 2019-09-11 DIAGNOSIS — N186 End stage renal disease: Secondary | ICD-10-CM | POA: Diagnosis not present

## 2019-09-11 DIAGNOSIS — N2581 Secondary hyperparathyroidism of renal origin: Secondary | ICD-10-CM | POA: Diagnosis not present

## 2019-09-11 DIAGNOSIS — Z992 Dependence on renal dialysis: Secondary | ICD-10-CM | POA: Diagnosis not present

## 2019-09-13 DIAGNOSIS — N2581 Secondary hyperparathyroidism of renal origin: Secondary | ICD-10-CM | POA: Diagnosis not present

## 2019-09-13 DIAGNOSIS — Z992 Dependence on renal dialysis: Secondary | ICD-10-CM | POA: Diagnosis not present

## 2019-09-13 DIAGNOSIS — N186 End stage renal disease: Secondary | ICD-10-CM | POA: Diagnosis not present

## 2019-09-16 DIAGNOSIS — N2581 Secondary hyperparathyroidism of renal origin: Secondary | ICD-10-CM | POA: Diagnosis not present

## 2019-09-16 DIAGNOSIS — N186 End stage renal disease: Secondary | ICD-10-CM | POA: Diagnosis not present

## 2019-09-16 DIAGNOSIS — Z992 Dependence on renal dialysis: Secondary | ICD-10-CM | POA: Diagnosis not present

## 2019-09-20 DIAGNOSIS — N186 End stage renal disease: Secondary | ICD-10-CM | POA: Diagnosis not present

## 2019-09-20 DIAGNOSIS — N2581 Secondary hyperparathyroidism of renal origin: Secondary | ICD-10-CM | POA: Diagnosis not present

## 2019-09-20 DIAGNOSIS — Z992 Dependence on renal dialysis: Secondary | ICD-10-CM | POA: Diagnosis not present

## 2019-09-24 DIAGNOSIS — N2581 Secondary hyperparathyroidism of renal origin: Secondary | ICD-10-CM | POA: Diagnosis not present

## 2019-09-24 DIAGNOSIS — N186 End stage renal disease: Secondary | ICD-10-CM | POA: Diagnosis not present

## 2019-09-24 DIAGNOSIS — Z992 Dependence on renal dialysis: Secondary | ICD-10-CM | POA: Diagnosis not present

## 2019-09-27 DIAGNOSIS — N186 End stage renal disease: Secondary | ICD-10-CM | POA: Diagnosis not present

## 2019-09-27 DIAGNOSIS — N2581 Secondary hyperparathyroidism of renal origin: Secondary | ICD-10-CM | POA: Diagnosis not present

## 2019-09-27 DIAGNOSIS — Z992 Dependence on renal dialysis: Secondary | ICD-10-CM | POA: Diagnosis not present

## 2019-10-01 DIAGNOSIS — N186 End stage renal disease: Secondary | ICD-10-CM | POA: Diagnosis not present

## 2019-10-01 DIAGNOSIS — N2581 Secondary hyperparathyroidism of renal origin: Secondary | ICD-10-CM | POA: Diagnosis not present

## 2019-10-01 DIAGNOSIS — Z992 Dependence on renal dialysis: Secondary | ICD-10-CM | POA: Diagnosis not present

## 2019-10-04 DIAGNOSIS — N2581 Secondary hyperparathyroidism of renal origin: Secondary | ICD-10-CM | POA: Diagnosis not present

## 2019-10-04 DIAGNOSIS — Z992 Dependence on renal dialysis: Secondary | ICD-10-CM | POA: Diagnosis not present

## 2019-10-04 DIAGNOSIS — N186 End stage renal disease: Secondary | ICD-10-CM | POA: Diagnosis not present

## 2019-10-08 DIAGNOSIS — Z992 Dependence on renal dialysis: Secondary | ICD-10-CM | POA: Diagnosis not present

## 2019-10-08 DIAGNOSIS — N186 End stage renal disease: Secondary | ICD-10-CM | POA: Diagnosis not present

## 2019-10-08 DIAGNOSIS — N2581 Secondary hyperparathyroidism of renal origin: Secondary | ICD-10-CM | POA: Diagnosis not present

## 2019-10-11 DIAGNOSIS — N186 End stage renal disease: Secondary | ICD-10-CM | POA: Diagnosis not present

## 2019-10-11 DIAGNOSIS — N2581 Secondary hyperparathyroidism of renal origin: Secondary | ICD-10-CM | POA: Diagnosis not present

## 2019-10-11 DIAGNOSIS — Z992 Dependence on renal dialysis: Secondary | ICD-10-CM | POA: Diagnosis not present

## 2019-10-15 DIAGNOSIS — N186 End stage renal disease: Secondary | ICD-10-CM | POA: Diagnosis not present

## 2019-10-15 DIAGNOSIS — N2581 Secondary hyperparathyroidism of renal origin: Secondary | ICD-10-CM | POA: Diagnosis not present

## 2019-10-15 DIAGNOSIS — Z992 Dependence on renal dialysis: Secondary | ICD-10-CM | POA: Diagnosis not present

## 2019-10-17 DIAGNOSIS — N186 End stage renal disease: Secondary | ICD-10-CM | POA: Diagnosis not present

## 2019-10-17 DIAGNOSIS — Z992 Dependence on renal dialysis: Secondary | ICD-10-CM | POA: Diagnosis not present

## 2019-10-18 DIAGNOSIS — N2581 Secondary hyperparathyroidism of renal origin: Secondary | ICD-10-CM | POA: Diagnosis not present

## 2019-10-18 DIAGNOSIS — N186 End stage renal disease: Secondary | ICD-10-CM | POA: Diagnosis not present

## 2019-10-18 DIAGNOSIS — Z992 Dependence on renal dialysis: Secondary | ICD-10-CM | POA: Diagnosis not present

## 2019-10-22 DIAGNOSIS — N186 End stage renal disease: Secondary | ICD-10-CM | POA: Diagnosis not present

## 2019-10-22 DIAGNOSIS — N2581 Secondary hyperparathyroidism of renal origin: Secondary | ICD-10-CM | POA: Diagnosis not present

## 2019-10-22 DIAGNOSIS — Z992 Dependence on renal dialysis: Secondary | ICD-10-CM | POA: Diagnosis not present

## 2019-10-25 DIAGNOSIS — N2581 Secondary hyperparathyroidism of renal origin: Secondary | ICD-10-CM | POA: Diagnosis not present

## 2019-10-25 DIAGNOSIS — N186 End stage renal disease: Secondary | ICD-10-CM | POA: Diagnosis not present

## 2019-10-25 DIAGNOSIS — Z992 Dependence on renal dialysis: Secondary | ICD-10-CM | POA: Diagnosis not present

## 2019-10-29 DIAGNOSIS — N186 End stage renal disease: Secondary | ICD-10-CM | POA: Diagnosis not present

## 2019-10-29 DIAGNOSIS — N2581 Secondary hyperparathyroidism of renal origin: Secondary | ICD-10-CM | POA: Diagnosis not present

## 2019-10-29 DIAGNOSIS — Z992 Dependence on renal dialysis: Secondary | ICD-10-CM | POA: Diagnosis not present

## 2019-11-01 DIAGNOSIS — N2581 Secondary hyperparathyroidism of renal origin: Secondary | ICD-10-CM | POA: Diagnosis not present

## 2019-11-01 DIAGNOSIS — N186 End stage renal disease: Secondary | ICD-10-CM | POA: Diagnosis not present

## 2019-11-01 DIAGNOSIS — Z992 Dependence on renal dialysis: Secondary | ICD-10-CM | POA: Diagnosis not present

## 2019-11-05 DIAGNOSIS — Z992 Dependence on renal dialysis: Secondary | ICD-10-CM | POA: Diagnosis not present

## 2019-11-05 DIAGNOSIS — N186 End stage renal disease: Secondary | ICD-10-CM | POA: Diagnosis not present

## 2019-11-05 DIAGNOSIS — N2581 Secondary hyperparathyroidism of renal origin: Secondary | ICD-10-CM | POA: Diagnosis not present

## 2019-11-08 DIAGNOSIS — N2581 Secondary hyperparathyroidism of renal origin: Secondary | ICD-10-CM | POA: Diagnosis not present

## 2019-11-08 DIAGNOSIS — N186 End stage renal disease: Secondary | ICD-10-CM | POA: Diagnosis not present

## 2019-11-08 DIAGNOSIS — Z992 Dependence on renal dialysis: Secondary | ICD-10-CM | POA: Diagnosis not present

## 2019-11-12 DIAGNOSIS — N186 End stage renal disease: Secondary | ICD-10-CM | POA: Diagnosis not present

## 2019-11-12 DIAGNOSIS — Z79899 Other long term (current) drug therapy: Secondary | ICD-10-CM | POA: Diagnosis not present

## 2019-11-12 DIAGNOSIS — N2581 Secondary hyperparathyroidism of renal origin: Secondary | ICD-10-CM | POA: Diagnosis not present

## 2019-11-12 DIAGNOSIS — Z992 Dependence on renal dialysis: Secondary | ICD-10-CM | POA: Diagnosis not present

## 2019-11-14 DIAGNOSIS — Z992 Dependence on renal dialysis: Secondary | ICD-10-CM | POA: Diagnosis not present

## 2019-11-14 DIAGNOSIS — N186 End stage renal disease: Secondary | ICD-10-CM | POA: Diagnosis not present

## 2019-11-15 DIAGNOSIS — Z992 Dependence on renal dialysis: Secondary | ICD-10-CM | POA: Diagnosis not present

## 2019-11-15 DIAGNOSIS — N186 End stage renal disease: Secondary | ICD-10-CM | POA: Diagnosis not present

## 2019-11-15 DIAGNOSIS — N2581 Secondary hyperparathyroidism of renal origin: Secondary | ICD-10-CM | POA: Diagnosis not present

## 2019-11-19 DIAGNOSIS — Z992 Dependence on renal dialysis: Secondary | ICD-10-CM | POA: Diagnosis not present

## 2019-11-19 DIAGNOSIS — N2581 Secondary hyperparathyroidism of renal origin: Secondary | ICD-10-CM | POA: Diagnosis not present

## 2019-11-19 DIAGNOSIS — N186 End stage renal disease: Secondary | ICD-10-CM | POA: Diagnosis not present

## 2019-11-22 DIAGNOSIS — N186 End stage renal disease: Secondary | ICD-10-CM | POA: Diagnosis not present

## 2019-11-22 DIAGNOSIS — Z992 Dependence on renal dialysis: Secondary | ICD-10-CM | POA: Diagnosis not present

## 2019-11-22 DIAGNOSIS — N2581 Secondary hyperparathyroidism of renal origin: Secondary | ICD-10-CM | POA: Diagnosis not present

## 2019-11-26 DIAGNOSIS — N186 End stage renal disease: Secondary | ICD-10-CM | POA: Diagnosis not present

## 2019-11-26 DIAGNOSIS — Z992 Dependence on renal dialysis: Secondary | ICD-10-CM | POA: Diagnosis not present

## 2019-11-26 DIAGNOSIS — N2581 Secondary hyperparathyroidism of renal origin: Secondary | ICD-10-CM | POA: Diagnosis not present

## 2019-11-29 DIAGNOSIS — Z992 Dependence on renal dialysis: Secondary | ICD-10-CM | POA: Diagnosis not present

## 2019-11-29 DIAGNOSIS — N186 End stage renal disease: Secondary | ICD-10-CM | POA: Diagnosis not present

## 2019-11-29 DIAGNOSIS — N2581 Secondary hyperparathyroidism of renal origin: Secondary | ICD-10-CM | POA: Diagnosis not present

## 2019-12-03 DIAGNOSIS — N186 End stage renal disease: Secondary | ICD-10-CM | POA: Diagnosis not present

## 2019-12-03 DIAGNOSIS — Z992 Dependence on renal dialysis: Secondary | ICD-10-CM | POA: Diagnosis not present

## 2019-12-03 DIAGNOSIS — N2581 Secondary hyperparathyroidism of renal origin: Secondary | ICD-10-CM | POA: Diagnosis not present

## 2019-12-06 DIAGNOSIS — N186 End stage renal disease: Secondary | ICD-10-CM | POA: Diagnosis not present

## 2019-12-06 DIAGNOSIS — N2581 Secondary hyperparathyroidism of renal origin: Secondary | ICD-10-CM | POA: Diagnosis not present

## 2019-12-06 DIAGNOSIS — Z992 Dependence on renal dialysis: Secondary | ICD-10-CM | POA: Diagnosis not present

## 2019-12-10 DIAGNOSIS — N186 End stage renal disease: Secondary | ICD-10-CM | POA: Diagnosis not present

## 2019-12-10 DIAGNOSIS — N2581 Secondary hyperparathyroidism of renal origin: Secondary | ICD-10-CM | POA: Diagnosis not present

## 2019-12-10 DIAGNOSIS — Z992 Dependence on renal dialysis: Secondary | ICD-10-CM | POA: Diagnosis not present

## 2019-12-13 DIAGNOSIS — N186 End stage renal disease: Secondary | ICD-10-CM | POA: Diagnosis not present

## 2019-12-13 DIAGNOSIS — N2581 Secondary hyperparathyroidism of renal origin: Secondary | ICD-10-CM | POA: Diagnosis not present

## 2019-12-13 DIAGNOSIS — Z992 Dependence on renal dialysis: Secondary | ICD-10-CM | POA: Diagnosis not present

## 2019-12-15 DIAGNOSIS — Z992 Dependence on renal dialysis: Secondary | ICD-10-CM | POA: Diagnosis not present

## 2019-12-15 DIAGNOSIS — N186 End stage renal disease: Secondary | ICD-10-CM | POA: Diagnosis not present

## 2019-12-17 DIAGNOSIS — N186 End stage renal disease: Secondary | ICD-10-CM | POA: Diagnosis not present

## 2019-12-17 DIAGNOSIS — Z992 Dependence on renal dialysis: Secondary | ICD-10-CM | POA: Diagnosis not present

## 2019-12-17 DIAGNOSIS — N2581 Secondary hyperparathyroidism of renal origin: Secondary | ICD-10-CM | POA: Diagnosis not present

## 2019-12-20 DIAGNOSIS — N186 End stage renal disease: Secondary | ICD-10-CM | POA: Diagnosis not present

## 2019-12-20 DIAGNOSIS — N2581 Secondary hyperparathyroidism of renal origin: Secondary | ICD-10-CM | POA: Diagnosis not present

## 2019-12-20 DIAGNOSIS — Z992 Dependence on renal dialysis: Secondary | ICD-10-CM | POA: Diagnosis not present

## 2019-12-24 DIAGNOSIS — N2581 Secondary hyperparathyroidism of renal origin: Secondary | ICD-10-CM | POA: Diagnosis not present

## 2019-12-24 DIAGNOSIS — N186 End stage renal disease: Secondary | ICD-10-CM | POA: Diagnosis not present

## 2019-12-24 DIAGNOSIS — Z992 Dependence on renal dialysis: Secondary | ICD-10-CM | POA: Diagnosis not present

## 2019-12-27 DIAGNOSIS — Z992 Dependence on renal dialysis: Secondary | ICD-10-CM | POA: Diagnosis not present

## 2019-12-27 DIAGNOSIS — N186 End stage renal disease: Secondary | ICD-10-CM | POA: Diagnosis not present

## 2019-12-27 DIAGNOSIS — N2581 Secondary hyperparathyroidism of renal origin: Secondary | ICD-10-CM | POA: Diagnosis not present

## 2019-12-31 DIAGNOSIS — N186 End stage renal disease: Secondary | ICD-10-CM | POA: Diagnosis not present

## 2019-12-31 DIAGNOSIS — N2581 Secondary hyperparathyroidism of renal origin: Secondary | ICD-10-CM | POA: Diagnosis not present

## 2019-12-31 DIAGNOSIS — Z992 Dependence on renal dialysis: Secondary | ICD-10-CM | POA: Diagnosis not present

## 2020-01-03 DIAGNOSIS — N186 End stage renal disease: Secondary | ICD-10-CM | POA: Diagnosis not present

## 2020-01-03 DIAGNOSIS — Z992 Dependence on renal dialysis: Secondary | ICD-10-CM | POA: Diagnosis not present

## 2020-01-03 DIAGNOSIS — N2581 Secondary hyperparathyroidism of renal origin: Secondary | ICD-10-CM | POA: Diagnosis not present

## 2020-01-07 DIAGNOSIS — N2581 Secondary hyperparathyroidism of renal origin: Secondary | ICD-10-CM | POA: Diagnosis not present

## 2020-01-07 DIAGNOSIS — N186 End stage renal disease: Secondary | ICD-10-CM | POA: Diagnosis not present

## 2020-01-07 DIAGNOSIS — Z992 Dependence on renal dialysis: Secondary | ICD-10-CM | POA: Diagnosis not present

## 2020-01-10 DIAGNOSIS — N2581 Secondary hyperparathyroidism of renal origin: Secondary | ICD-10-CM | POA: Diagnosis not present

## 2020-01-10 DIAGNOSIS — Z992 Dependence on renal dialysis: Secondary | ICD-10-CM | POA: Diagnosis not present

## 2020-01-10 DIAGNOSIS — N186 End stage renal disease: Secondary | ICD-10-CM | POA: Diagnosis not present

## 2020-01-14 DIAGNOSIS — Z992 Dependence on renal dialysis: Secondary | ICD-10-CM | POA: Diagnosis not present

## 2020-01-14 DIAGNOSIS — N2581 Secondary hyperparathyroidism of renal origin: Secondary | ICD-10-CM | POA: Diagnosis not present

## 2020-01-14 DIAGNOSIS — N186 End stage renal disease: Secondary | ICD-10-CM | POA: Diagnosis not present

## 2020-01-17 DIAGNOSIS — Z992 Dependence on renal dialysis: Secondary | ICD-10-CM | POA: Diagnosis not present

## 2020-01-17 DIAGNOSIS — N2581 Secondary hyperparathyroidism of renal origin: Secondary | ICD-10-CM | POA: Diagnosis not present

## 2020-01-17 DIAGNOSIS — N186 End stage renal disease: Secondary | ICD-10-CM | POA: Diagnosis not present

## 2020-01-19 DIAGNOSIS — I871 Compression of vein: Secondary | ICD-10-CM | POA: Diagnosis not present

## 2020-01-19 DIAGNOSIS — N186 End stage renal disease: Secondary | ICD-10-CM | POA: Diagnosis not present

## 2020-01-19 DIAGNOSIS — T82858A Stenosis of vascular prosthetic devices, implants and grafts, initial encounter: Secondary | ICD-10-CM | POA: Diagnosis not present

## 2020-01-21 DIAGNOSIS — N186 End stage renal disease: Secondary | ICD-10-CM | POA: Diagnosis not present

## 2020-01-21 DIAGNOSIS — Z992 Dependence on renal dialysis: Secondary | ICD-10-CM | POA: Diagnosis not present

## 2020-01-21 DIAGNOSIS — N2581 Secondary hyperparathyroidism of renal origin: Secondary | ICD-10-CM | POA: Diagnosis not present

## 2020-01-24 DIAGNOSIS — N2581 Secondary hyperparathyroidism of renal origin: Secondary | ICD-10-CM | POA: Diagnosis not present

## 2020-01-24 DIAGNOSIS — N186 End stage renal disease: Secondary | ICD-10-CM | POA: Diagnosis not present

## 2020-01-24 DIAGNOSIS — Z992 Dependence on renal dialysis: Secondary | ICD-10-CM | POA: Diagnosis not present

## 2020-01-28 DIAGNOSIS — N2581 Secondary hyperparathyroidism of renal origin: Secondary | ICD-10-CM | POA: Diagnosis not present

## 2020-01-28 DIAGNOSIS — N186 End stage renal disease: Secondary | ICD-10-CM | POA: Diagnosis not present

## 2020-01-28 DIAGNOSIS — Z992 Dependence on renal dialysis: Secondary | ICD-10-CM | POA: Diagnosis not present

## 2020-01-31 DIAGNOSIS — N186 End stage renal disease: Secondary | ICD-10-CM | POA: Diagnosis not present

## 2020-01-31 DIAGNOSIS — Z992 Dependence on renal dialysis: Secondary | ICD-10-CM | POA: Diagnosis not present

## 2020-01-31 DIAGNOSIS — N2581 Secondary hyperparathyroidism of renal origin: Secondary | ICD-10-CM | POA: Diagnosis not present

## 2020-02-04 DIAGNOSIS — N2581 Secondary hyperparathyroidism of renal origin: Secondary | ICD-10-CM | POA: Diagnosis not present

## 2020-02-04 DIAGNOSIS — N186 End stage renal disease: Secondary | ICD-10-CM | POA: Diagnosis not present

## 2020-02-04 DIAGNOSIS — Z992 Dependence on renal dialysis: Secondary | ICD-10-CM | POA: Diagnosis not present

## 2020-02-07 DIAGNOSIS — Z992 Dependence on renal dialysis: Secondary | ICD-10-CM | POA: Diagnosis not present

## 2020-02-07 DIAGNOSIS — N2581 Secondary hyperparathyroidism of renal origin: Secondary | ICD-10-CM | POA: Diagnosis not present

## 2020-02-07 DIAGNOSIS — N186 End stage renal disease: Secondary | ICD-10-CM | POA: Diagnosis not present

## 2020-02-11 DIAGNOSIS — N186 End stage renal disease: Secondary | ICD-10-CM | POA: Diagnosis not present

## 2020-02-11 DIAGNOSIS — Z992 Dependence on renal dialysis: Secondary | ICD-10-CM | POA: Diagnosis not present

## 2020-02-11 DIAGNOSIS — N2581 Secondary hyperparathyroidism of renal origin: Secondary | ICD-10-CM | POA: Diagnosis not present

## 2020-02-14 DIAGNOSIS — N186 End stage renal disease: Secondary | ICD-10-CM | POA: Diagnosis not present

## 2020-02-14 DIAGNOSIS — N2581 Secondary hyperparathyroidism of renal origin: Secondary | ICD-10-CM | POA: Diagnosis not present

## 2020-02-14 DIAGNOSIS — Z992 Dependence on renal dialysis: Secondary | ICD-10-CM | POA: Diagnosis not present

## 2020-02-18 DIAGNOSIS — N186 End stage renal disease: Secondary | ICD-10-CM | POA: Diagnosis not present

## 2020-02-18 DIAGNOSIS — Z992 Dependence on renal dialysis: Secondary | ICD-10-CM | POA: Diagnosis not present

## 2020-02-18 DIAGNOSIS — N2581 Secondary hyperparathyroidism of renal origin: Secondary | ICD-10-CM | POA: Diagnosis not present

## 2020-02-21 DIAGNOSIS — N186 End stage renal disease: Secondary | ICD-10-CM | POA: Diagnosis not present

## 2020-02-21 DIAGNOSIS — Z992 Dependence on renal dialysis: Secondary | ICD-10-CM | POA: Diagnosis not present

## 2020-02-21 DIAGNOSIS — N2581 Secondary hyperparathyroidism of renal origin: Secondary | ICD-10-CM | POA: Diagnosis not present

## 2020-02-25 DIAGNOSIS — N2581 Secondary hyperparathyroidism of renal origin: Secondary | ICD-10-CM | POA: Diagnosis not present

## 2020-02-25 DIAGNOSIS — Z992 Dependence on renal dialysis: Secondary | ICD-10-CM | POA: Diagnosis not present

## 2020-02-25 DIAGNOSIS — N186 End stage renal disease: Secondary | ICD-10-CM | POA: Diagnosis not present

## 2020-02-28 DIAGNOSIS — Z992 Dependence on renal dialysis: Secondary | ICD-10-CM | POA: Diagnosis not present

## 2020-02-28 DIAGNOSIS — N2581 Secondary hyperparathyroidism of renal origin: Secondary | ICD-10-CM | POA: Diagnosis not present

## 2020-02-28 DIAGNOSIS — N186 End stage renal disease: Secondary | ICD-10-CM | POA: Diagnosis not present

## 2020-03-03 DIAGNOSIS — N2581 Secondary hyperparathyroidism of renal origin: Secondary | ICD-10-CM | POA: Diagnosis not present

## 2020-03-03 DIAGNOSIS — Z992 Dependence on renal dialysis: Secondary | ICD-10-CM | POA: Diagnosis not present

## 2020-03-03 DIAGNOSIS — N186 End stage renal disease: Secondary | ICD-10-CM | POA: Diagnosis not present

## 2020-03-06 DIAGNOSIS — Z992 Dependence on renal dialysis: Secondary | ICD-10-CM | POA: Diagnosis not present

## 2020-03-06 DIAGNOSIS — N186 End stage renal disease: Secondary | ICD-10-CM | POA: Diagnosis not present

## 2020-03-06 DIAGNOSIS — N2581 Secondary hyperparathyroidism of renal origin: Secondary | ICD-10-CM | POA: Diagnosis not present

## 2020-03-10 DIAGNOSIS — Z992 Dependence on renal dialysis: Secondary | ICD-10-CM | POA: Diagnosis not present

## 2020-03-10 DIAGNOSIS — N186 End stage renal disease: Secondary | ICD-10-CM | POA: Diagnosis not present

## 2020-03-10 DIAGNOSIS — N2581 Secondary hyperparathyroidism of renal origin: Secondary | ICD-10-CM | POA: Diagnosis not present

## 2020-03-13 DIAGNOSIS — Z992 Dependence on renal dialysis: Secondary | ICD-10-CM | POA: Diagnosis not present

## 2020-03-13 DIAGNOSIS — N186 End stage renal disease: Secondary | ICD-10-CM | POA: Diagnosis not present

## 2020-03-13 DIAGNOSIS — N2581 Secondary hyperparathyroidism of renal origin: Secondary | ICD-10-CM | POA: Diagnosis not present

## 2020-03-15 DIAGNOSIS — N186 End stage renal disease: Secondary | ICD-10-CM | POA: Diagnosis not present

## 2020-03-15 DIAGNOSIS — Z992 Dependence on renal dialysis: Secondary | ICD-10-CM | POA: Diagnosis not present

## 2020-03-17 DIAGNOSIS — Z992 Dependence on renal dialysis: Secondary | ICD-10-CM | POA: Diagnosis not present

## 2020-03-17 DIAGNOSIS — N186 End stage renal disease: Secondary | ICD-10-CM | POA: Diagnosis not present

## 2020-03-17 DIAGNOSIS — N2581 Secondary hyperparathyroidism of renal origin: Secondary | ICD-10-CM | POA: Diagnosis not present

## 2020-03-20 DIAGNOSIS — N186 End stage renal disease: Secondary | ICD-10-CM | POA: Diagnosis not present

## 2020-03-20 DIAGNOSIS — N2581 Secondary hyperparathyroidism of renal origin: Secondary | ICD-10-CM | POA: Diagnosis not present

## 2020-03-20 DIAGNOSIS — Z992 Dependence on renal dialysis: Secondary | ICD-10-CM | POA: Diagnosis not present

## 2020-03-24 DIAGNOSIS — N2581 Secondary hyperparathyroidism of renal origin: Secondary | ICD-10-CM | POA: Diagnosis not present

## 2020-03-24 DIAGNOSIS — Z992 Dependence on renal dialysis: Secondary | ICD-10-CM | POA: Diagnosis not present

## 2020-03-24 DIAGNOSIS — N186 End stage renal disease: Secondary | ICD-10-CM | POA: Diagnosis not present

## 2020-03-27 DIAGNOSIS — N186 End stage renal disease: Secondary | ICD-10-CM | POA: Diagnosis not present

## 2020-03-27 DIAGNOSIS — N2581 Secondary hyperparathyroidism of renal origin: Secondary | ICD-10-CM | POA: Diagnosis not present

## 2020-03-27 DIAGNOSIS — Z992 Dependence on renal dialysis: Secondary | ICD-10-CM | POA: Diagnosis not present

## 2020-03-31 DIAGNOSIS — N186 End stage renal disease: Secondary | ICD-10-CM | POA: Diagnosis not present

## 2020-03-31 DIAGNOSIS — N2581 Secondary hyperparathyroidism of renal origin: Secondary | ICD-10-CM | POA: Diagnosis not present

## 2020-03-31 DIAGNOSIS — Z992 Dependence on renal dialysis: Secondary | ICD-10-CM | POA: Diagnosis not present

## 2020-04-03 DIAGNOSIS — N186 End stage renal disease: Secondary | ICD-10-CM | POA: Diagnosis not present

## 2020-04-03 DIAGNOSIS — N2581 Secondary hyperparathyroidism of renal origin: Secondary | ICD-10-CM | POA: Diagnosis not present

## 2020-04-03 DIAGNOSIS — Z992 Dependence on renal dialysis: Secondary | ICD-10-CM | POA: Diagnosis not present

## 2020-04-07 DIAGNOSIS — N186 End stage renal disease: Secondary | ICD-10-CM | POA: Diagnosis not present

## 2020-04-07 DIAGNOSIS — Z992 Dependence on renal dialysis: Secondary | ICD-10-CM | POA: Diagnosis not present

## 2020-04-07 DIAGNOSIS — N2581 Secondary hyperparathyroidism of renal origin: Secondary | ICD-10-CM | POA: Diagnosis not present

## 2020-04-10 DIAGNOSIS — N186 End stage renal disease: Secondary | ICD-10-CM | POA: Diagnosis not present

## 2020-04-10 DIAGNOSIS — Z992 Dependence on renal dialysis: Secondary | ICD-10-CM | POA: Diagnosis not present

## 2020-04-10 DIAGNOSIS — N2581 Secondary hyperparathyroidism of renal origin: Secondary | ICD-10-CM | POA: Diagnosis not present

## 2020-04-14 DIAGNOSIS — N2581 Secondary hyperparathyroidism of renal origin: Secondary | ICD-10-CM | POA: Diagnosis not present

## 2020-04-14 DIAGNOSIS — Z992 Dependence on renal dialysis: Secondary | ICD-10-CM | POA: Diagnosis not present

## 2020-04-14 DIAGNOSIS — N186 End stage renal disease: Secondary | ICD-10-CM | POA: Diagnosis not present

## 2020-04-15 DIAGNOSIS — Z992 Dependence on renal dialysis: Secondary | ICD-10-CM | POA: Diagnosis not present

## 2020-04-15 DIAGNOSIS — N186 End stage renal disease: Secondary | ICD-10-CM | POA: Diagnosis not present

## 2020-04-17 DIAGNOSIS — N186 End stage renal disease: Secondary | ICD-10-CM | POA: Diagnosis not present

## 2020-04-17 DIAGNOSIS — N2581 Secondary hyperparathyroidism of renal origin: Secondary | ICD-10-CM | POA: Diagnosis not present

## 2020-04-17 DIAGNOSIS — Z992 Dependence on renal dialysis: Secondary | ICD-10-CM | POA: Diagnosis not present

## 2020-04-21 DIAGNOSIS — N2581 Secondary hyperparathyroidism of renal origin: Secondary | ICD-10-CM | POA: Diagnosis not present

## 2020-04-21 DIAGNOSIS — N186 End stage renal disease: Secondary | ICD-10-CM | POA: Diagnosis not present

## 2020-04-21 DIAGNOSIS — Z992 Dependence on renal dialysis: Secondary | ICD-10-CM | POA: Diagnosis not present

## 2020-04-24 DIAGNOSIS — N186 End stage renal disease: Secondary | ICD-10-CM | POA: Diagnosis not present

## 2020-04-24 DIAGNOSIS — Z992 Dependence on renal dialysis: Secondary | ICD-10-CM | POA: Diagnosis not present

## 2020-04-24 DIAGNOSIS — N2581 Secondary hyperparathyroidism of renal origin: Secondary | ICD-10-CM | POA: Diagnosis not present

## 2020-04-28 DIAGNOSIS — Z992 Dependence on renal dialysis: Secondary | ICD-10-CM | POA: Diagnosis not present

## 2020-04-28 DIAGNOSIS — N2581 Secondary hyperparathyroidism of renal origin: Secondary | ICD-10-CM | POA: Diagnosis not present

## 2020-04-28 DIAGNOSIS — N186 End stage renal disease: Secondary | ICD-10-CM | POA: Diagnosis not present

## 2020-05-01 DIAGNOSIS — N2581 Secondary hyperparathyroidism of renal origin: Secondary | ICD-10-CM | POA: Diagnosis not present

## 2020-05-01 DIAGNOSIS — N186 End stage renal disease: Secondary | ICD-10-CM | POA: Diagnosis not present

## 2020-05-01 DIAGNOSIS — Z992 Dependence on renal dialysis: Secondary | ICD-10-CM | POA: Diagnosis not present

## 2020-05-05 DIAGNOSIS — Z992 Dependence on renal dialysis: Secondary | ICD-10-CM | POA: Diagnosis not present

## 2020-05-05 DIAGNOSIS — N2581 Secondary hyperparathyroidism of renal origin: Secondary | ICD-10-CM | POA: Diagnosis not present

## 2020-05-05 DIAGNOSIS — N186 End stage renal disease: Secondary | ICD-10-CM | POA: Diagnosis not present

## 2020-05-08 DIAGNOSIS — N186 End stage renal disease: Secondary | ICD-10-CM | POA: Diagnosis not present

## 2020-05-08 DIAGNOSIS — Z992 Dependence on renal dialysis: Secondary | ICD-10-CM | POA: Diagnosis not present

## 2020-05-08 DIAGNOSIS — N2581 Secondary hyperparathyroidism of renal origin: Secondary | ICD-10-CM | POA: Diagnosis not present

## 2020-05-12 DIAGNOSIS — N2581 Secondary hyperparathyroidism of renal origin: Secondary | ICD-10-CM | POA: Diagnosis not present

## 2020-05-12 DIAGNOSIS — Z992 Dependence on renal dialysis: Secondary | ICD-10-CM | POA: Diagnosis not present

## 2020-05-12 DIAGNOSIS — N186 End stage renal disease: Secondary | ICD-10-CM | POA: Diagnosis not present

## 2020-05-16 DIAGNOSIS — Z992 Dependence on renal dialysis: Secondary | ICD-10-CM | POA: Diagnosis not present

## 2020-05-16 DIAGNOSIS — N186 End stage renal disease: Secondary | ICD-10-CM | POA: Diagnosis not present

## 2020-05-17 DIAGNOSIS — Z992 Dependence on renal dialysis: Secondary | ICD-10-CM | POA: Diagnosis not present

## 2020-05-17 DIAGNOSIS — N2581 Secondary hyperparathyroidism of renal origin: Secondary | ICD-10-CM | POA: Diagnosis not present

## 2020-05-17 DIAGNOSIS — N186 End stage renal disease: Secondary | ICD-10-CM | POA: Diagnosis not present

## 2020-05-19 DIAGNOSIS — N186 End stage renal disease: Secondary | ICD-10-CM | POA: Diagnosis not present

## 2020-05-19 DIAGNOSIS — N2581 Secondary hyperparathyroidism of renal origin: Secondary | ICD-10-CM | POA: Diagnosis not present

## 2020-05-19 DIAGNOSIS — Z992 Dependence on renal dialysis: Secondary | ICD-10-CM | POA: Diagnosis not present

## 2020-05-22 DIAGNOSIS — N2581 Secondary hyperparathyroidism of renal origin: Secondary | ICD-10-CM | POA: Diagnosis not present

## 2020-05-22 DIAGNOSIS — N186 End stage renal disease: Secondary | ICD-10-CM | POA: Diagnosis not present

## 2020-05-22 DIAGNOSIS — Z992 Dependence on renal dialysis: Secondary | ICD-10-CM | POA: Diagnosis not present

## 2020-05-26 DIAGNOSIS — Z992 Dependence on renal dialysis: Secondary | ICD-10-CM | POA: Diagnosis not present

## 2020-05-26 DIAGNOSIS — N186 End stage renal disease: Secondary | ICD-10-CM | POA: Diagnosis not present

## 2020-05-26 DIAGNOSIS — N2581 Secondary hyperparathyroidism of renal origin: Secondary | ICD-10-CM | POA: Diagnosis not present

## 2020-05-29 DIAGNOSIS — Z992 Dependence on renal dialysis: Secondary | ICD-10-CM | POA: Diagnosis not present

## 2020-05-29 DIAGNOSIS — N186 End stage renal disease: Secondary | ICD-10-CM | POA: Diagnosis not present

## 2020-05-29 DIAGNOSIS — N2581 Secondary hyperparathyroidism of renal origin: Secondary | ICD-10-CM | POA: Diagnosis not present

## 2020-06-02 DIAGNOSIS — N186 End stage renal disease: Secondary | ICD-10-CM | POA: Diagnosis not present

## 2020-06-02 DIAGNOSIS — Z992 Dependence on renal dialysis: Secondary | ICD-10-CM | POA: Diagnosis not present

## 2020-06-02 DIAGNOSIS — N2581 Secondary hyperparathyroidism of renal origin: Secondary | ICD-10-CM | POA: Diagnosis not present

## 2020-06-05 DIAGNOSIS — N186 End stage renal disease: Secondary | ICD-10-CM | POA: Diagnosis not present

## 2020-06-05 DIAGNOSIS — Z992 Dependence on renal dialysis: Secondary | ICD-10-CM | POA: Diagnosis not present

## 2020-06-05 DIAGNOSIS — N2581 Secondary hyperparathyroidism of renal origin: Secondary | ICD-10-CM | POA: Diagnosis not present

## 2020-06-09 DIAGNOSIS — N186 End stage renal disease: Secondary | ICD-10-CM | POA: Diagnosis not present

## 2020-06-09 DIAGNOSIS — Z992 Dependence on renal dialysis: Secondary | ICD-10-CM | POA: Diagnosis not present

## 2020-06-09 DIAGNOSIS — N2581 Secondary hyperparathyroidism of renal origin: Secondary | ICD-10-CM | POA: Diagnosis not present

## 2020-06-12 DIAGNOSIS — Z992 Dependence on renal dialysis: Secondary | ICD-10-CM | POA: Diagnosis not present

## 2020-06-12 DIAGNOSIS — N186 End stage renal disease: Secondary | ICD-10-CM | POA: Diagnosis not present

## 2020-06-12 DIAGNOSIS — N2581 Secondary hyperparathyroidism of renal origin: Secondary | ICD-10-CM | POA: Diagnosis not present

## 2020-06-15 DIAGNOSIS — Z992 Dependence on renal dialysis: Secondary | ICD-10-CM | POA: Diagnosis not present

## 2020-06-15 DIAGNOSIS — N186 End stage renal disease: Secondary | ICD-10-CM | POA: Diagnosis not present

## 2020-06-16 DIAGNOSIS — N2581 Secondary hyperparathyroidism of renal origin: Secondary | ICD-10-CM | POA: Diagnosis not present

## 2020-06-16 DIAGNOSIS — N186 End stage renal disease: Secondary | ICD-10-CM | POA: Diagnosis not present

## 2020-06-16 DIAGNOSIS — Z992 Dependence on renal dialysis: Secondary | ICD-10-CM | POA: Diagnosis not present

## 2020-06-19 DIAGNOSIS — N2581 Secondary hyperparathyroidism of renal origin: Secondary | ICD-10-CM | POA: Diagnosis not present

## 2020-06-19 DIAGNOSIS — Z992 Dependence on renal dialysis: Secondary | ICD-10-CM | POA: Diagnosis not present

## 2020-06-19 DIAGNOSIS — N186 End stage renal disease: Secondary | ICD-10-CM | POA: Diagnosis not present

## 2020-06-23 DIAGNOSIS — Z992 Dependence on renal dialysis: Secondary | ICD-10-CM | POA: Diagnosis not present

## 2020-06-23 DIAGNOSIS — N186 End stage renal disease: Secondary | ICD-10-CM | POA: Diagnosis not present

## 2020-06-23 DIAGNOSIS — N2581 Secondary hyperparathyroidism of renal origin: Secondary | ICD-10-CM | POA: Diagnosis not present

## 2020-06-26 DIAGNOSIS — N2581 Secondary hyperparathyroidism of renal origin: Secondary | ICD-10-CM | POA: Diagnosis not present

## 2020-06-26 DIAGNOSIS — Z992 Dependence on renal dialysis: Secondary | ICD-10-CM | POA: Diagnosis not present

## 2020-06-26 DIAGNOSIS — N186 End stage renal disease: Secondary | ICD-10-CM | POA: Diagnosis not present

## 2020-06-30 DIAGNOSIS — N2581 Secondary hyperparathyroidism of renal origin: Secondary | ICD-10-CM | POA: Diagnosis not present

## 2020-06-30 DIAGNOSIS — N186 End stage renal disease: Secondary | ICD-10-CM | POA: Diagnosis not present

## 2020-06-30 DIAGNOSIS — Z992 Dependence on renal dialysis: Secondary | ICD-10-CM | POA: Diagnosis not present

## 2020-07-03 DIAGNOSIS — N186 End stage renal disease: Secondary | ICD-10-CM | POA: Diagnosis not present

## 2020-07-03 DIAGNOSIS — N2581 Secondary hyperparathyroidism of renal origin: Secondary | ICD-10-CM | POA: Diagnosis not present

## 2020-07-03 DIAGNOSIS — Z992 Dependence on renal dialysis: Secondary | ICD-10-CM | POA: Diagnosis not present

## 2020-07-07 DIAGNOSIS — Z992 Dependence on renal dialysis: Secondary | ICD-10-CM | POA: Diagnosis not present

## 2020-07-07 DIAGNOSIS — N186 End stage renal disease: Secondary | ICD-10-CM | POA: Diagnosis not present

## 2020-07-07 DIAGNOSIS — N2581 Secondary hyperparathyroidism of renal origin: Secondary | ICD-10-CM | POA: Diagnosis not present

## 2020-07-10 DIAGNOSIS — Z992 Dependence on renal dialysis: Secondary | ICD-10-CM | POA: Diagnosis not present

## 2020-07-10 DIAGNOSIS — N186 End stage renal disease: Secondary | ICD-10-CM | POA: Diagnosis not present

## 2020-07-10 DIAGNOSIS — N2581 Secondary hyperparathyroidism of renal origin: Secondary | ICD-10-CM | POA: Diagnosis not present

## 2020-07-14 DIAGNOSIS — N2581 Secondary hyperparathyroidism of renal origin: Secondary | ICD-10-CM | POA: Diagnosis not present

## 2020-07-14 DIAGNOSIS — N186 End stage renal disease: Secondary | ICD-10-CM | POA: Diagnosis not present

## 2020-07-14 DIAGNOSIS — Z992 Dependence on renal dialysis: Secondary | ICD-10-CM | POA: Diagnosis not present

## 2020-07-16 DIAGNOSIS — Z992 Dependence on renal dialysis: Secondary | ICD-10-CM | POA: Diagnosis not present

## 2020-07-16 DIAGNOSIS — N186 End stage renal disease: Secondary | ICD-10-CM | POA: Diagnosis not present

## 2020-07-17 DIAGNOSIS — N2581 Secondary hyperparathyroidism of renal origin: Secondary | ICD-10-CM | POA: Diagnosis not present

## 2020-07-17 DIAGNOSIS — Z992 Dependence on renal dialysis: Secondary | ICD-10-CM | POA: Diagnosis not present

## 2020-07-17 DIAGNOSIS — N186 End stage renal disease: Secondary | ICD-10-CM | POA: Diagnosis not present

## 2020-07-21 DIAGNOSIS — N2581 Secondary hyperparathyroidism of renal origin: Secondary | ICD-10-CM | POA: Diagnosis not present

## 2020-07-21 DIAGNOSIS — Z992 Dependence on renal dialysis: Secondary | ICD-10-CM | POA: Diagnosis not present

## 2020-07-21 DIAGNOSIS — N186 End stage renal disease: Secondary | ICD-10-CM | POA: Diagnosis not present

## 2020-07-24 DIAGNOSIS — Z992 Dependence on renal dialysis: Secondary | ICD-10-CM | POA: Diagnosis not present

## 2020-07-24 DIAGNOSIS — N186 End stage renal disease: Secondary | ICD-10-CM | POA: Diagnosis not present

## 2020-07-24 DIAGNOSIS — N2581 Secondary hyperparathyroidism of renal origin: Secondary | ICD-10-CM | POA: Diagnosis not present

## 2020-07-28 DIAGNOSIS — N2581 Secondary hyperparathyroidism of renal origin: Secondary | ICD-10-CM | POA: Diagnosis not present

## 2020-07-28 DIAGNOSIS — Z992 Dependence on renal dialysis: Secondary | ICD-10-CM | POA: Diagnosis not present

## 2020-07-28 DIAGNOSIS — N186 End stage renal disease: Secondary | ICD-10-CM | POA: Diagnosis not present

## 2020-07-31 DIAGNOSIS — N186 End stage renal disease: Secondary | ICD-10-CM | POA: Diagnosis not present

## 2020-07-31 DIAGNOSIS — N2581 Secondary hyperparathyroidism of renal origin: Secondary | ICD-10-CM | POA: Diagnosis not present

## 2020-07-31 DIAGNOSIS — Z992 Dependence on renal dialysis: Secondary | ICD-10-CM | POA: Diagnosis not present

## 2020-08-04 DIAGNOSIS — N2581 Secondary hyperparathyroidism of renal origin: Secondary | ICD-10-CM | POA: Diagnosis not present

## 2020-08-04 DIAGNOSIS — N186 End stage renal disease: Secondary | ICD-10-CM | POA: Diagnosis not present

## 2020-08-04 DIAGNOSIS — Z992 Dependence on renal dialysis: Secondary | ICD-10-CM | POA: Diagnosis not present

## 2020-08-07 DIAGNOSIS — N186 End stage renal disease: Secondary | ICD-10-CM | POA: Diagnosis not present

## 2020-08-07 DIAGNOSIS — N2581 Secondary hyperparathyroidism of renal origin: Secondary | ICD-10-CM | POA: Diagnosis not present

## 2020-08-07 DIAGNOSIS — Z992 Dependence on renal dialysis: Secondary | ICD-10-CM | POA: Diagnosis not present

## 2020-08-11 DIAGNOSIS — N186 End stage renal disease: Secondary | ICD-10-CM | POA: Diagnosis not present

## 2020-08-11 DIAGNOSIS — Z992 Dependence on renal dialysis: Secondary | ICD-10-CM | POA: Diagnosis not present

## 2020-08-11 DIAGNOSIS — N2581 Secondary hyperparathyroidism of renal origin: Secondary | ICD-10-CM | POA: Diagnosis not present

## 2020-08-14 ENCOUNTER — Emergency Department: Payer: Medicare HMO

## 2020-08-14 ENCOUNTER — Encounter: Payer: Self-pay | Admitting: Intensive Care

## 2020-08-14 ENCOUNTER — Inpatient Hospital Stay
Admission: EM | Admit: 2020-08-14 | Discharge: 2020-08-16 | DRG: 177 | Disposition: A | Discharge status: Home / Self Care | Payer: Medicare HMO | Attending: Internal Medicine | Admitting: Internal Medicine

## 2020-08-14 ENCOUNTER — Other Ambulatory Visit: Payer: Self-pay

## 2020-08-14 DIAGNOSIS — N2581 Secondary hyperparathyroidism of renal origin: Secondary | ICD-10-CM | POA: Diagnosis present

## 2020-08-14 DIAGNOSIS — I10 Essential (primary) hypertension: Secondary | ICD-10-CM | POA: Diagnosis present

## 2020-08-14 DIAGNOSIS — R531 Weakness: Secondary | ICD-10-CM | POA: Diagnosis not present

## 2020-08-14 DIAGNOSIS — N186 End stage renal disease: Secondary | ICD-10-CM | POA: Diagnosis not present

## 2020-08-14 DIAGNOSIS — U071 COVID-19: Secondary | ICD-10-CM | POA: Diagnosis not present

## 2020-08-14 DIAGNOSIS — E871 Hypo-osmolality and hyponatremia: Secondary | ICD-10-CM | POA: Diagnosis present

## 2020-08-14 DIAGNOSIS — Z841 Family history of disorders of kidney and ureter: Secondary | ICD-10-CM

## 2020-08-14 DIAGNOSIS — J189 Pneumonia, unspecified organism: Secondary | ICD-10-CM | POA: Diagnosis not present

## 2020-08-14 DIAGNOSIS — I120 Hypertensive chronic kidney disease with stage 5 chronic kidney disease or end stage renal disease: Secondary | ICD-10-CM | POA: Diagnosis present

## 2020-08-14 DIAGNOSIS — Z7902 Long term (current) use of antithrombotics/antiplatelets: Secondary | ICD-10-CM

## 2020-08-14 DIAGNOSIS — R0902 Hypoxemia: Secondary | ICD-10-CM

## 2020-08-14 DIAGNOSIS — Z90710 Acquired absence of both cervix and uterus: Secondary | ICD-10-CM | POA: Diagnosis not present

## 2020-08-14 DIAGNOSIS — Z79899 Other long term (current) drug therapy: Secondary | ICD-10-CM | POA: Diagnosis not present

## 2020-08-14 DIAGNOSIS — Z87891 Personal history of nicotine dependence: Secondary | ICD-10-CM

## 2020-08-14 DIAGNOSIS — Z992 Dependence on renal dialysis: Secondary | ICD-10-CM

## 2020-08-14 DIAGNOSIS — E7800 Pure hypercholesterolemia, unspecified: Secondary | ICD-10-CM | POA: Diagnosis present

## 2020-08-14 DIAGNOSIS — E876 Hypokalemia: Secondary | ICD-10-CM | POA: Diagnosis present

## 2020-08-14 DIAGNOSIS — D631 Anemia in chronic kidney disease: Secondary | ICD-10-CM | POA: Diagnosis not present

## 2020-08-14 DIAGNOSIS — Z8249 Family history of ischemic heart disease and other diseases of the circulatory system: Secondary | ICD-10-CM

## 2020-08-14 DIAGNOSIS — J1282 Pneumonia due to coronavirus disease 2019: Secondary | ICD-10-CM | POA: Diagnosis present

## 2020-08-14 DIAGNOSIS — E785 Hyperlipidemia, unspecified: Secondary | ICD-10-CM | POA: Diagnosis present

## 2020-08-14 DIAGNOSIS — R1111 Vomiting without nausea: Secondary | ICD-10-CM | POA: Diagnosis not present

## 2020-08-14 DIAGNOSIS — J9601 Acute respiratory failure with hypoxia: Secondary | ICD-10-CM | POA: Diagnosis present

## 2020-08-14 LAB — CBC WITH DIFFERENTIAL/PLATELET
Abs Immature Granulocytes: 0.1 10*3/uL — ABNORMAL HIGH (ref 0.00–0.07)
Basophils Absolute: 0 10*3/uL (ref 0.0–0.1)
Basophils Relative: 0 %
Eosinophils Absolute: 0 10*3/uL (ref 0.0–0.5)
Eosinophils Relative: 0 %
HCT: 36.2 % (ref 36.0–46.0)
Hemoglobin: 12.1 g/dL (ref 12.0–15.0)
Immature Granulocytes: 2 %
Lymphocytes Relative: 15 %
Lymphs Abs: 0.9 10*3/uL (ref 0.7–4.0)
MCH: 26.2 pg (ref 26.0–34.0)
MCHC: 33.4 g/dL (ref 30.0–36.0)
MCV: 78.5 fL — ABNORMAL LOW (ref 80.0–100.0)
Monocytes Absolute: 0.5 10*3/uL (ref 0.1–1.0)
Monocytes Relative: 9 %
Neutro Abs: 4.8 10*3/uL (ref 1.7–7.7)
Neutrophils Relative %: 74 %
Platelets: 243 10*3/uL (ref 150–400)
RBC: 4.61 MIL/uL (ref 3.87–5.11)
RDW: 13.6 % (ref 11.5–15.5)
WBC: 6.4 10*3/uL (ref 4.0–10.5)
nRBC: 0 % (ref 0.0–0.2)

## 2020-08-14 LAB — COMPREHENSIVE METABOLIC PANEL
ALT: 15 U/L (ref 0–44)
AST: 28 U/L (ref 15–41)
Albumin: 3.5 g/dL (ref 3.5–5.0)
Alkaline Phosphatase: 46 U/L (ref 38–126)
Anion gap: 15 (ref 5–15)
BUN: 24 mg/dL — ABNORMAL HIGH (ref 8–23)
CO2: 23 mmol/L (ref 22–32)
Calcium: 8.2 mg/dL — ABNORMAL LOW (ref 8.9–10.3)
Chloride: 94 mmol/L — ABNORMAL LOW (ref 98–111)
Creatinine, Ser: 6.57 mg/dL — ABNORMAL HIGH (ref 0.44–1.00)
GFR, Estimated: 6 mL/min — ABNORMAL LOW (ref >60)
Glucose, Bld: 91 mg/dL (ref 70–99)
Potassium: 3.3 mmol/L — ABNORMAL LOW (ref 3.5–5.1)
Sodium: 132 mmol/L — ABNORMAL LOW (ref 135–145)
Total Bilirubin: 0.6 mg/dL (ref 0.3–1.2)
Total Protein: 7.6 g/dL (ref 6.5–8.1)

## 2020-08-14 LAB — URINALYSIS, COMPLETE (UACMP) WITH MICROSCOPIC
Bilirubin Urine: NEGATIVE
Glucose, UA: 50 mg/dL — AB
Hgb urine dipstick: NEGATIVE
Ketones, ur: NEGATIVE mg/dL
Leukocytes,Ua: NEGATIVE
Nitrite: NEGATIVE
Protein, ur: >=300 mg/dL — AB
Specific Gravity, Urine: 1.011 (ref 1.005–1.030)
pH: 8 (ref 5.0–8.0)

## 2020-08-14 LAB — PROCALCITONIN: Procalcitonin: 0.11 ng/mL

## 2020-08-14 LAB — RESP PANEL BY RT-PCR (FLU A&B, COVID) ARPGX2
Influenza A by PCR: NEGATIVE
Influenza B by PCR: NEGATIVE
SARS Coronavirus 2 by RT PCR: POSITIVE — AB

## 2020-08-14 LAB — FIBRINOGEN: Fibrinogen: 567 mg/dL — ABNORMAL HIGH (ref 210–475)

## 2020-08-14 LAB — PROTIME-INR
INR: 0.9 (ref 0.8–1.2)
Prothrombin Time: 11.3 seconds — ABNORMAL LOW (ref 11.4–15.2)

## 2020-08-14 LAB — LACTIC ACID, PLASMA: Lactic Acid, Venous: 1 mmol/L (ref 0.5–1.9)

## 2020-08-14 MED ORDER — HEPARIN SODIUM (PORCINE) 5000 UNIT/ML IJ SOLN
5000.0000 [IU] | Freq: Three times a day (TID) | INTRAMUSCULAR | Status: DC
  Administered 2020-08-15 – 2020-08-16 (×4): 5000 [IU] via SUBCUTANEOUS
  Filled 2020-08-14 (×4): qty 1

## 2020-08-14 MED ORDER — SODIUM CHLORIDE 0.9 % IV SOLN
100.0000 mg | Freq: Every day | INTRAVENOUS | Status: DC
  Administered 2020-08-15 – 2020-08-16 (×2): 100 mg via INTRAVENOUS
  Filled 2020-08-14: qty 20
  Filled 2020-08-14: qty 100
  Filled 2020-08-14: qty 20

## 2020-08-14 MED ORDER — DEXAMETHASONE SODIUM PHOSPHATE 10 MG/ML IJ SOLN
6.0000 mg | INTRAMUSCULAR | Status: DC
  Administered 2020-08-15: 23:00:00 6 mg via INTRAVENOUS
  Filled 2020-08-14: qty 1

## 2020-08-14 MED ORDER — DEXAMETHASONE SODIUM PHOSPHATE 10 MG/ML IJ SOLN
10.0000 mg | Freq: Once | INTRAMUSCULAR | Status: AC
  Administered 2020-08-14: 10 mg via INTRAVENOUS
  Filled 2020-08-14: qty 1

## 2020-08-14 MED ORDER — ACETAMINOPHEN 325 MG PO TABS: 650.0000 mg | ORAL_TABLET | Freq: Four times a day (QID) | ORAL | Status: DC | PRN

## 2020-08-14 MED ORDER — ONDANSETRON HCL 4 MG/2ML IJ SOLN: 4.0000 mg | Freq: Four times a day (QID) | INTRAMUSCULAR | Status: DC | PRN

## 2020-08-14 MED ORDER — LABETALOL HCL 5 MG/ML IV SOLN: 10.0000 mg | INTRAVENOUS | Status: DC | PRN

## 2020-08-14 MED ORDER — ONDANSETRON HCL 4 MG PO TABS: 4.0000 mg | ORAL_TABLET | Freq: Four times a day (QID) | ORAL | Status: DC | PRN

## 2020-08-14 MED ORDER — GUAIFENESIN-DM 100-10 MG/5ML PO SYRP
10.0000 mL | ORAL_SOLUTION | ORAL | Status: DC | PRN
  Administered 2020-08-15 – 2020-08-16 (×3): 10 mL via ORAL
  Filled 2020-08-14 (×3): qty 10

## 2020-08-14 MED ORDER — SODIUM CHLORIDE 0.9 % IV SOLN
200.0000 mg | Freq: Once | INTRAVENOUS | Status: DC
  Filled 2020-08-14: qty 40

## 2020-08-14 MED ORDER — ACETAMINOPHEN 325 MG PO TABS
650.0000 mg | ORAL_TABLET | Freq: Once | ORAL | Status: AC | PRN
  Administered 2020-08-14: 650 mg via ORAL
  Filled 2020-08-14: qty 2

## 2020-08-14 MED ORDER — SODIUM CHLORIDE 0.9 % IV SOLN: 250.0000 mL | INTRAVENOUS | Status: DC | PRN

## 2020-08-14 NOTE — ED Triage Notes (Signed)
Patient presents with fever, weakness, body aches and reports "just not feeling well." Denies knowing she had fever. Receives dialysis Monday and Friday. Completed dialysis today before coming to hospital. Dialysis access on left arm. Patient alert to self, place, and situation. Disoriented to time.

## 2020-08-14 NOTE — ED Notes (Signed)
Pt presents to ED w/ c/o weakness x 2 weeks. Pt denies any pain. Pt endorses a minor cough. Pt resting comfortably. Awaiting further orders. Will continue to monitor.

## 2020-08-14 NOTE — ED Notes (Signed)
Attempted to call report. Per unit [facility] manager not available and this pt has not been assigned to a RN yet. Pt in NAD at this time. VSS. Will continue to monitor.

## 2020-08-14 NOTE — Progress Notes (Signed)
Remdesivir - Pharmacy Brief Note   O:  ALT: 15 CXR: Potential developing pneumonia in the LEFT lung base. This could also represent atelectasis given lack of clear correlate on lateral view. SpO2: 100% on 2 L [place]   A/P:  Remdesivir 200 mg IVPB once followed by 100 mg IVPB daily x 4 days.   Dorena Bodo, PharmD 08/14/2020 7:04 PM

## 2020-08-14 NOTE — ED Provider Notes (Signed)
[facility] Emergency Department Provider Note   ____________________________________________   I have reviewed the triage vital signs and the nursing notes.   HISTORY  Chief Complaint Generalized weakness  History limited by: Not Limited   HPI Kathleen Sharp is a 74 y.o. female who presents to the emergency department today because of concern for generalized weakness. The patient states that her symptoms have been present for the past 2 weeks. The patient denies any focal weakness. While she was found to have a fever here in triage she has not noticed any fevers at home. Additionally the patient states that she has not had any chest pain or abdominal pain. Still makes urine although denies any painful or bad odor to her urine. She gets dialysis through a graft and denies any pain or swelling to that area. Had some diarrhea although no abdominal pain nausea or vomiting. Has had some decreased appetite.   Records reviewed. Per medical record review patient has a history of ESRD on dialysis.   Past Medical History:  Diagnosis Date  . Anemia   . Arthritis   . Chronic kidney disease    Chronic Kidney Disease  . Hypercholesterolemia   . Hypertension     Patient Active Problem List   Diagnosis Date Noted  . End stage renal disease ([place]) 07/11/2016  . Complication of vascular access for dialysis 07/11/2016  . Essential hypertension 07/11/2016  . Hemodialysis-associated hypotension 07/11/2016  . Anemia of chronic renal failure, stage 5 ([place]) 07/18/2015    Past Surgical History:  Procedure Laterality Date  . ABDOMINAL HYSTERECTOMY    . AV FISTULA PLACEMENT Left 12/15/2015   Procedure: INSERTION OF ARTERIOVENOUS (AV) GORE-TEX GRAFT ARM             ( BRACHIAL AXILLARY GRAFT );  Surgeon: [name], MD;  Location: [facility] ORS;  Service: Vascular;  Laterality: Left;  . CAPD INSERTION N/A 11/24/2015   Procedure: Exploratory laparoscopy;  Surgeon: [name], MD;  Location: [facility] ORS;  Service: Vascular;  Laterality: N/A;, attempted insertion but unable due to scar tissue  . PERIPHERAL VASCULAR CATHETERIZATION Left 02/27/2016   Procedure: Thrombectomy;  Surgeon: [name], MD;  Location: [place] [facility];  Service: Cardiovascular;  Laterality: Left;  . PERIPHERAL VASCULAR CATHETERIZATION Left 04/12/2016   Procedure: Thrombectomy;  Surgeon: [name], MD;  Location: [place] [facility];  Service: Cardiovascular;  Laterality: Left;  . PERIPHERAL VASCULAR CATHETERIZATION Left 05/22/2016   Procedure: Thrombectomy;  Surgeon: [name], MD;  Location: [place] [facility];  Service: Cardiovascular;  Laterality: Left;  . PERIPHERAL VASCULAR CATHETERIZATION N/A 05/22/2016   Procedure: A/V Shuntogram/Fistulagram;  Surgeon: [name], MD;  Location: [place] [facility];  Service: Cardiovascular;  Laterality: N/A;  . PERIPHERAL VASCULAR CATHETERIZATION Left 06/21/2016   Procedure: Thrombectomy;  Surgeon: [name], MD;  Location: [place] [facility];  Service: Cardiovascular;  Laterality: Left;    Prior to Admission medications   Medication Sig Start Date End Date Taking? Authorizing Provider  atorvastatin (LIPITOR) 20 MG tablet Take 20 mg by mouth daily at 6 PM.  09/19/14   [provider]  clopidogrel (PLAVIX) 75 MG tablet Take 75 mg by mouth daily.    [provider]  HYDROcodone-acetaminophen (NORCO) 5-325 MG tablet Take 1-2 tablets by mouth every 6 (six) hours as needed for moderate pain or severe pain. 12/15/15   Schnier, Dolores Lory, MD  lidocaine-prilocaine (EMLA) cream  APPLY TO ACCESS SITE 30 MINUTES BEFORE TREATMENT 03/14/16   [provider]  lisinopril (PRINIVIL,ZESTRIL) 5 MG tablet Take 10 mg by mouth daily.     [provider]  multivitamin (RENA-VIT) TABS tablet Take 1 tablet by mouth daily. 03/28/16   [provider]  RENVELA 800 MG tablet Take 1 tablet by  mouth 3 (three) times daily. 04/01/16   [provider]    Allergies Patient has no known allergies.  Family History  Problem Relation Age of Onset  . Kidney disease Father   . Heart disease Mother   . Kidney disease Brother     Social History Social History   Tobacco Use  . Smoking status: Former Smoker    Packs/day: 1.00    Years: 15.00    Pack years: 15.00    Types: Cigarettes    Quit date: 09/16/2012    Years since quitting: 7.9  . Smokeless tobacco: Never Used  Substance Use Topics  . Alcohol use: No    Alcohol/week: 0.0 standard drinks  . Drug use: No    Review of Systems Constitutional: Positive for generalized weakness. Eyes: No visual changes. ENT: No sore throat. Cardiovascular: Denies chest pain. Respiratory: Denies shortness of breath. Positive for cough. Gastrointestinal: No abdominal pain.  No nausea, no vomiting.  Positive for decreased appetite.  Genitourinary: Negative for dysuria. Musculoskeletal: Negative for back pain. Skin: Negative for rash. Neurological: Negative for headaches, focal weakness or numbness.  ____________________________________________   PHYSICAL EXAM:  VITAL SIGNS: ED Triage Vitals  Enc Vitals Group     BP 08/14/20 1442 (!) 187/82     Pulse Rate 08/14/20 1442 89     Resp 08/14/20 1442 18     Temp 08/14/20 1442 (!) 101.1 F (38.4 C)     Temp Source 08/14/20 1442 Oral     SpO2 08/14/20 1442 96 %     Weight 08/14/20 1436 188 lb (85.3 kg)     Height 08/14/20 1436 $RemoveBefor'5\' 3"'[id number]$  (1.6 m)     Head Circumference --      Peak Flow --      Pain Score 08/14/20 1443 4   Constitutional: Alert and oriented.  Eyes: Conjunctivae are normal.  ENT      Head: Normocephalic and atraumatic.      Nose: No congestion/rhinnorhea.      Mouth/Throat: Mucous membranes are moist.      Neck: No stridor. Hematological/Lymphatic/Immunilogical: No cervical lymphadenopathy. Cardiovascular: Normal rate, regular rhythm.  No murmurs, rubs, or  gallops.  Respiratory: Normal respiratory effort without tachypnea nor retractions. Breath sounds are clear and equal bilaterally. No wheezes/rales/rhonchi. Gastrointestinal: Soft and non tender. No rebound. No guarding.  Genitourinary: Deferred Musculoskeletal: Normal range of motion in all extremities. No lower extremity edema. Neurologic:  Normal speech and language. No gross focal neurologic deficits are appreciated.  Skin:  Skin is warm, dry and intact. No rash noted. Psychiatric: Mood and affect are normal. Speech and behavior are normal. Patient exhibits appropriate insight and judgment.  ____________________________________________    LABS (pertinent positives/negatives)  CBC wbc 6.4, hgb 12.1, plt 243 Procalcitonin 0.11 Lactic acid 1.0 UA cloudy, glucose 50, 0-5 rbc and wbc COVID positive CMP na 132, k 3.3, glu 91, cr 6.57  ____________________________________________   EKG  None  ____________________________________________    RADIOLOGY  CXR Potential developing pneumonia in the left lung base  ____________________________________________   PROCEDURES  Procedures  ____________________________________________   INITIAL IMPRESSION / ASSESSMENT AND PLAN /  ED COURSE  Pertinent labs & imaging results that were available during my care of the patient were reviewed by me and considered in my medical decision making (see chart for details).   Patient presented to the emergency department today because of concerns for generalized weakness that has been present for the past 2 weeks.  Broad work-up was initiated.  Chest x-ray was concerning for potentially developing left lower lobe pneumonia.  However patient's procalcitonin 0.11.  Covid was positive.  At this point I doubt bacterial pneumonia.  While here in the emergency department the patient desatted to 88% on room air while lying in the stretcher.  Because of this she was placed on nasal cannula.  Patient will  be admitted to the hospital service.  I discussed findings and plan with patient.   ____________________________________________   FINAL CLINICAL IMPRESSION(S) / ED DIAGNOSES  Final diagnoses:  Weakness  COVID-19  Hypoxia     Note: This dictation was prepared with Dragon dictation. Any transcriptional errors that result from this process are unintentional     Nance Pear, MD 08/14/20 [phone]

## 2020-08-14 NOTE — ED Notes (Signed)
Date and time results received: 08/14/20 1857 (use smartphrase ".now" to insert current time)  Test: Covid Critical Value: Positive  Name of Provider Notified: Archie Balboa, MD

## 2020-08-14 NOTE — H&P (Signed)
History and Physical    KENDAL GHAZARIAN XIH:[id number] DOB: 1946-04-18 DOA: 08/14/2020  PCP: Adalberto Ill, MD   Patient coming from: Home   Chief Complaint: Weakness, fatigue   HPI: Kathleen Sharp is a 74 y.o. female with medical history significant for ESRD on hemodialysis Mondays and Fridays, hypertension, and hyperlipidemia, now presenting to the emergency department with almost 2 weeks of fatigue and general malaise.  She reports receiving her second COVID-19 vaccination in March 2021.  She denies any chest pain, leg swelling, or productive cough.  She is not aware of any fevers.  ED Course: Upon arrival to the ED, patient is found to be febrile to 38.4 C, saturating upper 80s to low 90s on room air, mildly tachypneic, and with stable blood pressure.  EKG features sinus rhythm with sinus arrhythmia.  Chest x-ray notable for cardiomegaly, vascular congestion, and opacity in the left base.  Chemistry panel features a mild hyponatremia and hypokalemia, normal bicarbonate, and BUN of 24.  CBC notable for mild microcytosis without anemia.  COVID-19 PCR is positive.  Blood cultures were collected and the patient was treated with acetaminophen, Decadron, remdesivir, and supplemental oxygen.  Review of Systems:  All other systems reviewed and apart from HPI, are negative.  Past Medical History:  Diagnosis Date  . Anemia   . Arthritis   . Chronic kidney disease    Chronic Kidney Disease  . Hypercholesterolemia   . Hypertension     Past Surgical History:  Procedure Laterality Date  . ABDOMINAL HYSTERECTOMY    . AV FISTULA PLACEMENT Left 12/15/2015   Procedure: INSERTION OF ARTERIOVENOUS (AV) GORE-TEX GRAFT ARM             ( BRACHIAL AXILLARY GRAFT );  Surgeon: [name], MD;  Location: [facility] ORS;  Service: Vascular;  Laterality: Left;  . CAPD INSERTION N/A 11/24/2015   Procedure: Exploratory laparoscopy;  Surgeon: [name], MD;  Location: [facility];  Service: Vascular;   Laterality: N/A;, attempted insertion but unable due to scar tissue  . PERIPHERAL VASCULAR CATHETERIZATION Left 02/27/2016   Procedure: Thrombectomy;  Surgeon: [name], MD;  Location: [place] [facility];  Service: Cardiovascular;  Laterality: Left;  . PERIPHERAL VASCULAR CATHETERIZATION Left 04/12/2016   Procedure: Thrombectomy;  Surgeon: [name], MD;  Location: [place] [facility];  Service: Cardiovascular;  Laterality: Left;  . PERIPHERAL VASCULAR CATHETERIZATION Left 05/22/2016   Procedure: Thrombectomy;  Surgeon: [name], MD;  Location: [place] [facility];  Service: Cardiovascular;  Laterality: Left;  . PERIPHERAL VASCULAR CATHETERIZATION N/A 05/22/2016   Procedure: A/V Shuntogram/Fistulagram;  Surgeon: [name], MD;  Location: [place] [facility];  Service: Cardiovascular;  Laterality: N/A;  . PERIPHERAL VASCULAR CATHETERIZATION Left 06/21/2016   Procedure: Thrombectomy;  Surgeon: [name], MD;  Location: [place] [facility];  Service: Cardiovascular;  Laterality: Left;    Social History:   reports that she quit smoking about 7 years ago. Her smoking use included cigarettes. She has a 15.00 pack-year smoking history. She has never used smokeless tobacco. She reports that she does not drink alcohol and does not use drugs.  No Known Allergies  Family History  Problem Relation Age of Onset  . Kidney disease Father   . Heart disease Mother   . Kidney disease Brother      Prior to Admission medications   Medication Sig Start Date End Date Taking? Authorizing Provider  atorvastatin (LIPITOR) 20  MG tablet Take 20 mg by mouth daily at 6 PM.  09/19/14  Yes [provider]  carvedilol (COREG) 6.25 MG tablet Take 6.25 mg by mouth daily.   Yes [provider]  clopidogrel (PLAVIX) 75 MG tablet Take 75 mg by mouth daily.   Yes [provider]  lidocaine-prilocaine (EMLA) cream APPLY TO ACCESS SITE 30 MINUTES BEFORE  TREATMENT 03/14/16  Yes [provider]  lisinopril (ZESTRIL) 10 MG tablet Take 10 mg by mouth daily.    Yes [provider]  loratadine (CLARITIN) 10 MG tablet Take 10 mg by mouth daily.   Yes [provider]  multivitamin (RENA-VIT) TABS tablet Take 1 tablet by mouth daily. 03/28/16  Yes [provider]  RENVELA 800 MG tablet Take 1 tablet by mouth 3 (three) times daily. 04/01/16  Yes [provider]  HYDROcodone-acetaminophen (NORCO) 5-325 MG tablet Take 1-2 tablets by mouth every 6 (six) hours as needed for moderate pain or severe pain. Patient not taking: Reported on 08/14/2020 12/15/15   Schnier, Dolores Lory, MD    Physical Exam: Vitals:   08/14/20 1750 08/14/20 1800 08/14/20 1830 08/14/20 1900  BP: (!) 152/92 132/89 120/82 123/82  Pulse: 73 73 72 72  Resp: (!) 22 (!) 24 (!) 22 (!) 22  Temp:      TempSrc:      SpO2: 93% 96% 97% 98%  Weight:      Height:        Constitutional: NAD, calm  Eyes: PERTLA, lids and conjunctivae normal ENMT: Mucous membranes are moist. Posterior pharynx clear of any exudate or lesions.   Neck: normal, supple, no masses, no thyromegaly Respiratory: mild tachypnea, no wheezing. No pallor or cyanosis.  Cardiovascular: S1 & S2 heard, regular rate and rhythm. No extremity edema.   Abdomen: No distension, no tenderness, soft. Bowel sounds active.  Musculoskeletal: no clubbing / cyanosis. No joint deformity upper and lower extremities.   Skin: no significant rashes, lesions, ulcers. Xerosis. Warm, dry, well-perfused. Neurologic: CN 2-12 grossly intact. Sensation intact. Moving all extremities.  Psychiatric: Alert and oriented to person, place, and situation. Very pleasant and cooperative.    Labs and Imaging on Admission: I have personally reviewed following labs and imaging studies  CBC: Recent Labs  Lab 08/14/20 1540  WBC 6.4  NEUTROABS 4.8  HGB 12.1  HCT 36.2  MCV 78.5*  PLT 885   Basic Metabolic  Panel: Recent Labs  Lab 08/14/20 1457  NA 132*  K 3.3*  CL 94*  CO2 23  GLUCOSE 91  BUN 24*  CREATININE 6.57*  CALCIUM 8.2*   GFR: Estimated Creatinine Clearance: 8 mL/min (A) (by C-G formula based on SCr of 6.57 mg/dL (H)). Liver Function Tests: Recent Labs  Lab 08/14/20 1457  AST 28  ALT 15  ALKPHOS 46  BILITOT 0.6  PROT 7.6  ALBUMIN 3.5   No results for input(s): LIPASE, AMYLASE in the last 168 hours. No results for input(s): AMMONIA in the last 168 hours. Coagulation Profile: Recent Labs  Lab 08/14/20 1540  INR 0.9   Cardiac Enzymes: No results for input(s): CKTOTAL, CKMB, CKMBINDEX, TROPONINI in the last 168 hours. BNP (last 3 results) No results for input(s): PROBNP in the last 8760 hours. HbA1C: No results for input(s): HGBA1C in the last 72 hours. CBG: No results for input(s): GLUCAP in the last 168 hours. Lipid Profile: No results for input(s): CHOL, HDL, LDLCALC, TRIG, CHOLHDL, LDLDIRECT in the last 72 hours. Thyroid Function  Tests: No results for input(s): TSH, T4TOTAL, FREET4, T3FREE, THYROIDAB in the last 72 hours. Anemia Panel: No results for input(s): VITAMINB12, FOLATE, FERRITIN, TIBC, IRON, RETICCTPCT in the last 72 hours. Urine analysis:    Component Value Date/Time   COLORURINE YELLOW (A) 08/14/2020 1447   APPEARANCEUR CLOUDY (A) 08/14/2020 1447   LABSPEC 1.011 08/14/2020 1447   PHURINE 8.0 08/14/2020 1447   GLUCOSEU 50 (A) 08/14/2020 1447   HGBUR NEGATIVE 08/14/2020 [place] 08/14/2020 1447   KETONESUR NEGATIVE 08/14/2020 1447   PROTEINUR >=300 (A) 08/14/2020 1447   NITRITE NEGATIVE 08/14/2020 1447   LEUKOCYTESUR NEGATIVE 08/14/2020 1447   Sepsis Labs: $RemoveBefo'@[id number]$ (procalcitonin:4,lacticidven:4) ) Recent Results (from the past 240 hour(s))  Resp Panel by RT-PCR (Flu A&B, Covid) Nasopharyngeal Swab     Status: Abnormal   Collection Time: 08/14/20  5:12 PM   Specimen: Nasopharyngeal Swab; Nasopharyngeal(NP) swabs  in vial transport medium  Result Value Ref Range Status   SARS Coronavirus 2 by RT PCR POSITIVE (A) NEGATIVE Final    Comment: RESULT CALLED TO, READ BACK BY AND VERIFIED WITH: JENNIFER AGNEW 08/14/20 AT 1856 BY ACR (NOTE) SARS-CoV-2 target nucleic acids are DETECTED.  The SARS-CoV-2 RNA is generally detectable in upper respiratory specimens during the acute phase of infection. Positive results are indicative of the presence of the identified virus, but do not rule out bacterial infection or co-infection with other pathogens not detected by the test. Clinical correlation with patient history and other diagnostic information is necessary to determine patient infection status. The expected result is Negative.  Fact Sheet for Patients: EntrepreneurPulse.com.au  Fact Sheet for Healthcare Providers: IncredibleEmployment.be  This test is not yet approved or cleared by the [place] FDA and  has been authorized for detection and/or diagnosis of SARS-CoV-2 by FDA under an Emergency Use Authorization (EUA).  This EUA will remain in effect (meaning this test c an be used) for the duration of  the COVID-19 declaration under Section 564(b)(1) of the Act, 21 U.S.C. section 360bbb-3(b)(1), unless the authorization is terminated or revoked sooner.     Influenza A by PCR NEGATIVE NEGATIVE Final   Influenza B by PCR NEGATIVE NEGATIVE Final    Comment: (NOTE) The Xpert Xpress SARS-CoV-2/FLU/RSV plus assay is intended as an aid in the diagnosis of influenza from Nasopharyngeal swab specimens and should not be used as a sole basis for treatment. Nasal washings and aspirates are unacceptable for Xpert Xpress SARS-CoV-2/FLU/RSV testing.  Fact Sheet for Patients: EntrepreneurPulse.com.au  Fact Sheet for Healthcare Providers: IncredibleEmployment.be  This test is not yet approved or cleared by the [place] FDA  and has been authorized for detection and/or diagnosis of SARS-CoV-2 by FDA under an Emergency Use Authorization (EUA). This EUA will remain in effect (meaning this test can be used) for the duration of the COVID-19 declaration under Section 564(b)(1) of the Act, 21 U.S.C. section 360bbb-3(b)(1), unless the authorization is terminated or revoked.  Performed at [facility], [address] [place], [place] [address]      Radiological Exams on Admission: DG Chest 2 View  Result Date: 08/14/2020 CLINICAL DATA:  Suspected sepsis, fever and weakness. EXAM: CHEST - 2 VIEW COMPARISON:  February of 2017 FINDINGS: Stenting in the LEFT axilla. Heart size is enlarged, increased size since previous imaging. Fullness of RIGHT and LEFT hila. Mild interstitial prominence throughout the chest. No lobar consolidative changes. Partially obscured LEFT hemidiaphragm on frontal view. Osteopenia without acute musculoskeletal process on limited assessment. IMPRESSION:  1. Cardiomegaly with vascular congestion. 2. Potential developing pneumonia in the LEFT lung base. This could also represent atelectasis given lack of clear correlate on lateral view. 3. Signs of vascular stenting in the LEFT axilla. Electronically Signed   By: Zetta Bills M.D.   On: 08/14/2020 16:18    EKG: Independently reviewed. Sinus rhythm with sinus arrhythmia, LAFB, LVH with repolarization abnormality.   Assessment/Plan   1. COVID-19 with acute hypoxic respiratory failure  - Patient reports receiving 2nd COVID vaccine in March 2021, presents with almost 2 weeks of general malaise and fatigue, is found to have COVID-19, and dropped saturations to 88% on rm air  - There was question of possible PNA in left base on CXR but no productive cough, leukocytosis, or elevation in procalcitonin and suspicion for bacterial PNA is low  - Continue remdesivir and Decadron, continue supplemental O2 as needed, trend markers    2. ESRD  -  Patient reports dialyzing twice weekly on Mon and Fri, completed HD just prior to arrival in ED  - No indication for urgent HD on admission  - Renally-dose medications, fluid-restrict    3. Hypertension  - BP 150/90 on admission  - Pharmacy medication-reconciliation pending, will treat as-needed for now    4. Hyponatremia; hypokalemia  - Mild hyponatremia and hypokalemia noted on admission  - Plan to monitor for now in this ESRD patient who appears euvolemic - Repeat chem panel in am     DVT prophylaxis: sq heparin  Code Status: Full  Family Communication: Discussed with patient   Disposition Plan:  Patient is from: Home  Anticipated d/c is to: Home  Anticipated d/c date is: ~08/17/20 Patient currently: Pending improvement in respiratory status  Consults called: None  Admission status: Inpatient     Vianne Bulls, MD [facility]  08/14/2020, 7:44 PM

## 2020-08-15 DIAGNOSIS — U071 COVID-19: Secondary | ICD-10-CM | POA: Diagnosis not present

## 2020-08-15 DIAGNOSIS — Z992 Dependence on renal dialysis: Secondary | ICD-10-CM | POA: Diagnosis not present

## 2020-08-15 DIAGNOSIS — J9601 Acute respiratory failure with hypoxia: Secondary | ICD-10-CM | POA: Diagnosis not present

## 2020-08-15 DIAGNOSIS — N186 End stage renal disease: Secondary | ICD-10-CM | POA: Diagnosis not present

## 2020-08-15 LAB — CBC WITH DIFFERENTIAL/PLATELET
Abs Immature Granulocytes: 0.13 10*3/uL — ABNORMAL HIGH (ref 0.00–0.07)
Basophils Absolute: 0 10*3/uL (ref 0.0–0.1)
Basophils Relative: 0 %
Eosinophils Absolute: 0 10*3/uL (ref 0.0–0.5)
Eosinophils Relative: 0 %
HCT: 31.7 % — ABNORMAL LOW (ref 36.0–46.0)
Hemoglobin: 10.4 g/dL — ABNORMAL LOW (ref 12.0–15.0)
Immature Granulocytes: 2 %
Lymphocytes Relative: 11 %
Lymphs Abs: 0.7 10*3/uL (ref 0.7–4.0)
MCH: 26.2 pg (ref 26.0–34.0)
MCHC: 32.8 g/dL (ref 30.0–36.0)
MCV: 79.8 fL — ABNORMAL LOW (ref 80.0–100.0)
Monocytes Absolute: 0.5 10*3/uL (ref 0.1–1.0)
Monocytes Relative: 8 %
Neutro Abs: 4.8 10*3/uL (ref 1.7–7.7)
Neutrophils Relative %: 79 %
Platelets: 267 10*3/uL (ref 150–400)
RBC: 3.97 MIL/uL (ref 3.87–5.11)
RDW: 13.4 % (ref 11.5–15.5)
WBC: 6.1 10*3/uL (ref 4.0–10.5)
nRBC: 0 % (ref 0.0–0.2)

## 2020-08-15 LAB — COMPREHENSIVE METABOLIC PANEL
ALT: 15 U/L (ref 0–44)
AST: 25 U/L (ref 15–41)
Albumin: 3.2 g/dL — ABNORMAL LOW (ref 3.5–5.0)
Alkaline Phosphatase: 41 U/L (ref 38–126)
Anion gap: 16 — ABNORMAL HIGH (ref 5–15)
BUN: 35 mg/dL — ABNORMAL HIGH (ref 8–23)
CO2: 24 mmol/L (ref 22–32)
Calcium: 8.3 mg/dL — ABNORMAL LOW (ref 8.9–10.3)
Chloride: 94 mmol/L — ABNORMAL LOW (ref 98–111)
Creatinine, Ser: 8.35 mg/dL — ABNORMAL HIGH (ref 0.44–1.00)
GFR, Estimated: 5 mL/min — ABNORMAL LOW (ref >60)
Glucose, Bld: 119 mg/dL — ABNORMAL HIGH (ref 70–99)
Potassium: 3.8 mmol/L (ref 3.5–5.1)
Sodium: 134 mmol/L — ABNORMAL LOW (ref 135–145)
Total Bilirubin: 0.6 mg/dL (ref 0.3–1.2)
Total Protein: 7.1 g/dL (ref 6.5–8.1)

## 2020-08-15 LAB — MAGNESIUM: Magnesium: 2 mg/dL (ref 1.7–2.4)

## 2020-08-15 LAB — FERRITIN: Ferritin: 2330 ng/mL — ABNORMAL HIGH (ref 11–307)

## 2020-08-15 LAB — HEPATITIS B SURFACE ANTIGEN: Hepatitis B Surface Ag: NONREACTIVE

## 2020-08-15 LAB — C-REACTIVE PROTEIN
CRP: 9.9 mg/dL — ABNORMAL HIGH (ref <1.0)
CRP: 10.8 mg/dL — ABNORMAL HIGH (ref <1.0)

## 2020-08-15 LAB — D-DIMER, QUANTITATIVE
D-Dimer, Quant: 1.1 ug/mL-FEU — ABNORMAL HIGH (ref 0.00–0.50)
D-Dimer, Quant: 1.19 ug/mL-FEU — ABNORMAL HIGH (ref 0.00–0.50)

## 2020-08-15 NOTE — Progress Notes (Signed)
PROGRESS NOTE    ZENIAH BRINEY  IWL:[id number] DOB: [birth year] DOA: 08/14/2020 PCP: Adalberto Ill, MD    Assessment & Plan:   Principal Problem:   Acute hypoxemic respiratory failure due to COVID-19 [facility]) Active Problems:   End stage renal disease [facility])   Essential hypertension    TAYLOUR LIETZKE is a 74 y.o. female with medical history significant for ESRD on hemodialysis Mondays and Fridays, hypertension, and hyperlipidemia, now presenting to the emergency department with almost 2 weeks of fatigue and general malaise.  She reports receiving her second COVID-19 vaccination in March 2021.   1. COVID-19 with acute hypoxic respiratory failure  --found to have COVID-19, and dropped saturations to 88% on rm air  - There was question of possible PNA in left base on CXR but no productive cough, leukocytosis, or elevation in procalcitonin and suspicion for bacterial PNA is low  PLAN: --cont Remdesivir --cont steroid as oral decadron --Ensure CRP down-trending --Continue supplemental O2 to keep sats >=92%, wean as tolerated  2. ESRD on HD M/F --pt completed HD just prior to arrival in ED  --CXR showed Cardiomegaly with vascular congestion. --nephrology consult PLAN: --No urgent need for dialysis today, per nephrology  3. Hypertension  - BP 150/90 on admission  --cont home coreg and Lisinopril  4. Hyponatremia; hypokalemia  - Mild hyponatremia and hypokalemia noted on admission  - monitor for now.     DVT prophylaxis: Heparin SQ Code Status: Full code  Family Communication:  Status is: inpatient Dispo:   The patient is from: home Anticipated d/c is to: home Anticipated d/c date is: 1-2 days Patient currently is not medically stable to d/c due to: covid PNA on treatment.   Subjective and Interval History:  Pt reported breathing improved, was found without her O2.  Pt was having a hard time getting up to walk with the nursing.  Pt reported still making good amount  of urine.     Objective: Vitals:   08/15/20 0900 08/15/20 1500 08/15/20 1554 08/15/20 2049  BP:   (!) 174/83 135/85  Pulse:   80 79  Resp:   20 18  Temp:   99.2 F (37.3 C) 98.7 F (37.1 C)  TempSrc:   Oral Oral  SpO2: 97% 92% 92% 92%  Weight:      Height:       No intake or output data in the 24 hours ending 08/16/20 0311 Filed Weights   08/14/20 1436  Weight: 85.3 kg    Examination:   Constitutional: NAD, AAOx3, sitting at side of bed HEENT: conjunctivae and lids normal, EOMI CV: No cyanosis.   RESP: no crackles or wheezes, reduced lung sounds Extremities: No effusions, edema in BLE SKIN: warm, dry and intact Neuro: II - XII grossly intact.   Psych: Normal mood and affect.  Appropriate judgement and reason    Data Reviewed: I have personally reviewed following labs and imaging studies  CBC: Recent Labs  Lab 08/14/20 1540 08/15/20 0711  WBC 6.4 6.1  NEUTROABS 4.8 4.8  HGB 12.1 10.4*  HCT 36.2 31.7*  MCV 78.5* 79.8*  PLT 243 174   Basic Metabolic Panel: Recent Labs  Lab 08/14/20 1457 08/15/20 0711  NA 132* 134*  K 3.3* 3.8  CL 94* 94*  CO2 23 24  GLUCOSE 91 119*  BUN 24* 35*  CREATININE 6.57* 8.35*  CALCIUM 8.2* 8.3*  MG  --  2.0   GFR: Estimated Creatinine Clearance: 6.3 mL/min (A) (by  C-G formula based on SCr of 8.35 mg/dL (H)). Liver Function Tests: Recent Labs  Lab 08/14/20 1457 08/15/20 0711  AST 28 25  ALT 15 15  ALKPHOS 46 41  BILITOT 0.6 0.6  PROT 7.6 7.1  ALBUMIN 3.5 3.2*   No results for input(s): LIPASE, AMYLASE in the last 168 hours. No results for input(s): AMMONIA in the last 168 hours. Coagulation Profile: Recent Labs  Lab 08/14/20 1540  INR 0.9   Cardiac Enzymes: No results for input(s): CKTOTAL, CKMB, CKMBINDEX, TROPONINI in the last 168 hours. BNP (last 3 results) No results for input(s): PROBNP in the last 8760 hours. HbA1C: No results for input(s): HGBA1C in the last 72 hours. CBG: No results for  input(s): GLUCAP in the last 168 hours. Lipid Profile: No results for input(s): CHOL, HDL, LDLCALC, TRIG, CHOLHDL, LDLDIRECT in the last 72 hours. Thyroid Function Tests: No results for input(s): TSH, T4TOTAL, FREET4, T3FREE, THYROIDAB in the last 72 hours. Anemia Panel: Recent Labs    08/15/20 0711  FERRITIN 2,330*   Sepsis Labs: Recent Labs  Lab 08/14/20 1712  PROCALCITON 0.11  LATICACIDVEN 1.0    Recent Results (from the past 240 hour(s))  Culture, blood (Routine x 2)     Status: None (Preliminary result)   Collection Time: 08/14/20  2:58 PM   Specimen: BLOOD  Result Value Ref Range Status   Specimen Description BLOOD BLOOD RIGHT WRIST  Final   Special Requests   Final    BOTTLES DRAWN AEROBIC AND ANAEROBIC Blood Culture results may not be optimal due to an inadequate volume of blood received in culture bottles   Culture   Final    NO GROWTH < 24 HOURS Performed at [facility], [address], [place], [place] [address]    Report Status PENDING  Incomplete  Culture, blood (Routine x 2)     Status: None (Preliminary result)   Collection Time: 08/14/20  2:58 PM   Specimen: BLOOD  Result Value Ref Range Status   Specimen Description BLOOD BLOOD RIGHT ARM  Final   Special Requests   Final    BOTTLES DRAWN AEROBIC AND ANAEROBIC Blood Culture results may not be optimal due to an inadequate volume of blood received in culture bottles   Culture   Final    NO GROWTH < 24 HOURS Performed at [facility], [address], [place],  [address]    Report Status PENDING  Incomplete  Resp Panel by RT-PCR (Flu A&B, Covid) Nasopharyngeal Swab     Status: Abnormal   Collection Time: 08/14/20  5:12 PM   Specimen: Nasopharyngeal Swab; Nasopharyngeal(NP) swabs in vial transport medium  Result Value Ref Range Status   SARS Coronavirus 2 by RT PCR POSITIVE (A) NEGATIVE Final    Comment: RESULT CALLED TO, READ BACK BY AND VERIFIED WITH: JENNIFER AGNEW 08/14/20  AT 1856 BY ACR (NOTE) SARS-CoV-2 target nucleic acids are DETECTED.  The SARS-CoV-2 RNA is generally detectable in upper respiratory specimens during the acute phase of infection. Positive results are indicative of the presence of the identified virus, but do not rule out bacterial infection or co-infection with other pathogens not detected by the test. Clinical correlation with patient history and other diagnostic information is necessary to determine patient infection status. The expected result is Negative.  Fact Sheet for Patients: EntrepreneurPulse.com.au  Fact Sheet for Healthcare Providers: IncredibleEmployment.be  This test is not yet approved or cleared by the [place] FDA and  has been  authorized for detection and/or diagnosis of SARS-CoV-2 by FDA under an Emergency Use Authorization (EUA).  This EUA will remain in effect (meaning this test c an be used) for the duration of  the COVID-19 declaration under Section 564(b)(1) of the Act, 21 U.S.C. section 360bbb-3(b)(1), unless the authorization is terminated or revoked sooner.     Influenza A by PCR NEGATIVE NEGATIVE Final   Influenza B by PCR NEGATIVE NEGATIVE Final    Comment: (NOTE) The Xpert Xpress SARS-CoV-2/FLU/RSV plus assay is intended as an aid in the diagnosis of influenza from Nasopharyngeal swab specimens and should not be used as a sole basis for treatment. Nasal washings and aspirates are unacceptable for Xpert Xpress SARS-CoV-2/FLU/RSV testing.  Fact Sheet for Patients: EntrepreneurPulse.com.au  Fact Sheet for Healthcare Providers: IncredibleEmployment.be  This test is not yet approved or cleared by the [place] FDA and has been authorized for detection and/or diagnosis of SARS-CoV-2 by FDA under an Emergency Use Authorization (EUA). This EUA will remain in effect (meaning this test can be used) for the duration of  the COVID-19 declaration under Section 564(b)(1) of the Act, 21 U.S.C. section 360bbb-3(b)(1), unless the authorization is terminated or revoked.  Performed at [facility], [address] [place], [place] [address]       Radiology Studies: DG Chest 2 View  Result Date: 08/14/2020 CLINICAL DATA:  Suspected sepsis, fever and weakness. EXAM: CHEST - 2 VIEW COMPARISON:  February of 2017 FINDINGS: Stenting in the LEFT axilla. Heart size is enlarged, increased size since previous imaging. Fullness of RIGHT and LEFT hila. Mild interstitial prominence throughout the chest. No lobar consolidative changes. Partially obscured LEFT hemidiaphragm on frontal view. Osteopenia without acute musculoskeletal process on limited assessment. IMPRESSION: 1. Cardiomegaly with vascular congestion. 2. Potential developing pneumonia in the LEFT lung base. This could also represent atelectasis given lack of clear correlate on lateral view. 3. Signs of vascular stenting in the LEFT axilla. Electronically Signed   By: Zetta Bills M.D.   On: 08/14/2020 16:18     Scheduled Meds: . dexamethasone (DECADRON) injection  6 mg Intravenous Q24H  . heparin  5,000 Units Subcutaneous Q8H   Continuous Infusions: . sodium chloride    . remdesivir 200 mg in sodium chloride 0.9% 250 mL IVPB     Followed by  . remdesivir 100 mg in NS 100 mL 100 mg (08/15/20 0758)     LOS: 2 days     Enzo Bi, MD [facility] If 7PM-7AM, please contact night-coverage 08/16/2020, 3:11 AM

## 2020-08-15 NOTE — Progress Notes (Signed)
[facility]  ROUNDING NOTE   Subjective:   Kathleen Sharp is well known to our practice, presented to the ED yesterday with fatigue x 2 weeks.She has medical problems of hypertension, hyperlipidemia and ESRD on dialysis Mondays and Fridays.She received her full treatment prior to coming to the hospital.  Objective:  Vital signs in last 24 hours:  Temp:  [98.2 F (36.8 C)-100 F (37.8 C)] 99.2 F (37.3 C) (11/30 1554) Pulse Rate:  [71-80] 80 (11/30 1554) Resp:  [17-28] 20 (11/30 1554) BP: (120-184)/(76-103) 174/83 (11/30 1554) SpO2:  [92 %-100 %] 92 % (11/30 1554)  Weight change:  Filed Weights   08/14/20 1436  Weight: 85.3 kg    Intake/Output: No intake/output data recorded.   Intake/Output this shift:  No intake/output data recorded.  Physical Exam: General: No acute distress,sitting up at the side of the bed,consuming lunch  Head: Normocephalic, atraumatic. Moist oral mucosal membranes  Eyes: Anicteric  Lungs:  Respirations even,unlabored, lungs with fine crackles, diminished at the bases  Heart: Regular rate and rhythm  Abdomen:  Soft, nontender, non distended  Extremities:  No peripheral edema.  Neurologic: Oriented x 3  Skin: No acute  Lesions or rashes  Access: LUA AVF    Basic Metabolic Panel: Recent Labs  Lab 08/14/20 1457 08/15/20 0711  NA 132* 134*  K 3.3* 3.8  CL 94* 94*  CO2 23 24  GLUCOSE 91 119*  BUN 24* 35*  CREATININE 6.57* 8.35*  CALCIUM 8.2* 8.3*  MG  --  2.0    Liver Function Tests: Recent Labs  Lab 08/14/20 1457 08/15/20 0711  AST 28 25  ALT 15 15  ALKPHOS 46 41  BILITOT 0.6 0.6  PROT 7.6 7.1  ALBUMIN 3.5 3.2*   No results for input(s): LIPASE, AMYLASE in the last 168 hours. No results for input(s): AMMONIA in the last 168 hours.  CBC: Recent Labs  Lab 08/14/20 1540 08/15/20 0711  WBC 6.4 6.1  NEUTROABS 4.8 4.8  HGB 12.1 10.4*  HCT 36.2 31.7*  MCV 78.5* 79.8*  PLT 243 267    Cardiac Enzymes: No results  for input(s): CKTOTAL, CKMB, CKMBINDEX, TROPONINI in the last 168 hours.  BNP: Invalid input(s): POCBNP  CBG: No results for input(s): GLUCAP in the last 168 hours.  Microbiology: Results for orders placed or performed during the hospital encounter of 08/14/20  Culture, blood (Routine x 2)     Status: None (Preliminary result)   Collection Time: 08/14/20  2:58 PM   Specimen: BLOOD  Result Value Ref Range Status   Specimen Description BLOOD BLOOD RIGHT WRIST  Final   Special Requests   Final    BOTTLES DRAWN AEROBIC AND ANAEROBIC Blood Culture results may not be optimal due to an inadequate volume of blood received in culture bottles   Culture   Final    NO GROWTH < 24 HOURS Performed at [facility], [address] [place], [place] [address]    Report Status PENDING  Incomplete  Culture, blood (Routine x 2)     Status: None (Preliminary result)   Collection Time: 08/14/20  2:58 PM   Specimen: BLOOD  Result Value Ref Range Status   Specimen Description BLOOD BLOOD RIGHT ARM  Final   Special Requests   Final    BOTTLES DRAWN AEROBIC AND ANAEROBIC Blood Culture results may not be optimal due to an inadequate volume of blood received in culture bottles   Culture   Final  NO GROWTH < 24 HOURS Performed at [facility], [address] [place], [place] [address]    Report Status PENDING  Incomplete  Resp Panel by RT-PCR (Flu A&B, Covid) Nasopharyngeal Swab     Status: Abnormal   Collection Time: 08/14/20  5:12 PM   Specimen: Nasopharyngeal Swab; Nasopharyngeal(NP) swabs in vial transport medium  Result Value Ref Range Status   SARS Coronavirus 2 by RT PCR POSITIVE (A) NEGATIVE Final    Comment: RESULT CALLED TO, READ BACK BY AND VERIFIED WITH: JENNIFER AGNEW 08/14/20 AT 1856 BY ACR (NOTE) SARS-CoV-2 target nucleic acids are DETECTED.  The SARS-CoV-2 RNA is generally detectable in upper respiratory specimens during the acute phase of infection. Positive  results are indicative of the presence of the identified virus, but do not rule out bacterial infection or co-infection with other pathogens not detected by the test. Clinical correlation with patient history and other diagnostic information is necessary to determine patient infection status. The expected result is Negative.  Fact Sheet for Patients: EntrepreneurPulse.com.au  Fact Sheet for Healthcare Providers: IncredibleEmployment.be  This test is not yet approved or cleared by the [place] FDA and  has been authorized for detection and/or diagnosis of SARS-CoV-2 by FDA under an Emergency Use Authorization (EUA).  This EUA will remain in effect (meaning this test c an be used) for the duration of  the COVID-19 declaration under Section 564(b)(1) of the Act, 21 U.S.C. section 360bbb-3(b)(1), unless the authorization is terminated or revoked sooner.     Influenza A by PCR NEGATIVE NEGATIVE Final   Influenza B by PCR NEGATIVE NEGATIVE Final    Comment: (NOTE) The Xpert Xpress SARS-CoV-2/FLU/RSV plus assay is intended as an aid in the diagnosis of influenza from Nasopharyngeal swab specimens and should not be used as a sole basis for treatment. Nasal washings and aspirates are unacceptable for Xpert Xpress SARS-CoV-2/FLU/RSV testing.  Fact Sheet for Patients: EntrepreneurPulse.com.au  Fact Sheet for Healthcare Providers: IncredibleEmployment.be  This test is not yet approved or cleared by the [place] FDA and has been authorized for detection and/or diagnosis of SARS-CoV-2 by FDA under an Emergency Use Authorization (EUA). This EUA will remain in effect (meaning this test can be used) for the duration of the COVID-19 declaration under Section 564(b)(1) of the Act, 21 U.S.C. section 360bbb-3(b)(1), unless the authorization is terminated or revoked.  Performed at [facility], [address] [place], [place] [address]     Coagulation Studies: Recent Labs    08/14/20 1540  LABPROT 11.3*  INR 0.9    Urinalysis: Recent Labs    08/14/20 1447  COLORURINE YELLOW*  LABSPEC 1.011  PHURINE 8.0  GLUCOSEU 50*  HGBUR NEGATIVE  BILIRUBINUR NEGATIVE  KETONESUR NEGATIVE  PROTEINUR >=300*  NITRITE NEGATIVE  LEUKOCYTESUR NEGATIVE      Imaging: DG Chest 2 View  Result Date: 08/14/2020 CLINICAL DATA:  Suspected sepsis, fever and weakness. EXAM: CHEST - 2 VIEW COMPARISON:  February of 2017 FINDINGS: Stenting in the LEFT axilla. Heart size is enlarged, increased size since previous imaging. Fullness of RIGHT and LEFT hila. Mild interstitial prominence throughout the chest. No lobar consolidative changes. Partially obscured LEFT hemidiaphragm on frontal view. Osteopenia without acute musculoskeletal process on limited assessment. IMPRESSION: 1. Cardiomegaly with vascular congestion. 2. Potential developing pneumonia in the LEFT lung base. This could also represent atelectasis given lack of clear correlate on lateral view. 3. Signs of vascular stenting in the LEFT axilla. Electronically Signed   By:  Zetta Bills M.D.   On: 08/14/2020 16:18     Medications:   . sodium chloride    . remdesivir 200 mg in sodium chloride 0.9% 250 mL IVPB     Followed by  . remdesivir 100 mg in NS 100 mL 100 mg (08/15/20 0758)   . dexamethasone (DECADRON) injection  6 mg Intravenous Q24H  . heparin  5,000 Units Subcutaneous Q8H   sodium chloride, acetaminophen, guaiFENesin-dextromethorphan, labetalol, ondansetron **OR** ondansetron (ZOFRAN) IV  Assessment/ Plan:  Kathleen Sharp is a 74 y.o.  female presented to the ED yesterday with fatigue x 2 weeks.She has medical problems of hypertension, hyperlipidemia and ESRD on dialysis MF.She received her full treatment yesterday.  # ESRD on dialysis Mondays and Fridays Received full dialysis treatment yesterday Volume and  electrolyte status acceptable No additional dialysis required today Will continue assessing daily  # Covid 19 infection Patient is in no acute respiratory distress Not using supplemental O2 which is at bedside, able to talk in full sentences, no accessory muscle use for respiration Receiving  Remdesivir and Dexamethasone  Chest Xray on 08/14/2020 IMPRESSION: 1. Cardiomegaly with vascular congestion. 2. Potential developing pneumonia in the LEFT lung base. This could also represent atelectasis given lack of clear correlate on lateral view. 3. Signs of vascular stenting in the LEFT axilla.  # Secondary Hyperparathyroidism  Lab Results  Component Value Date   CALCIUM 8.3 (L) 08/15/2020  Will continue monitoring bone mineral metabolism parameters  #Hyponatremia Sodium was 132 on presentation, improved to 134 today Will continue monitoring  # Hypokalemia  Potassium normalized to 3.8 today  # Anemia of Chronic Kidney Disease Lab Results  Component Value Date   HGB 10.4 (L) 08/15/2020  At goal, will continue monitoring CBCs   LOS: 1 Jacquelina Hewins 11/30/20214:02 PM

## 2020-08-16 DIAGNOSIS — J9601 Acute respiratory failure with hypoxia: Secondary | ICD-10-CM

## 2020-08-16 DIAGNOSIS — U071 COVID-19: Secondary | ICD-10-CM | POA: Diagnosis not present

## 2020-08-16 LAB — BASIC METABOLIC PANEL
Anion gap: 17 — ABNORMAL HIGH (ref 5–15)
BUN: 54 mg/dL — ABNORMAL HIGH (ref 8–23)
CO2: 21 mmol/L — ABNORMAL LOW (ref 22–32)
Calcium: 8 mg/dL — ABNORMAL LOW (ref 8.9–10.3)
Chloride: 92 mmol/L — ABNORMAL LOW (ref 98–111)
Creatinine, Ser: 9.94 mg/dL — ABNORMAL HIGH (ref 0.44–1.00)
GFR, Estimated: 4 mL/min — ABNORMAL LOW (ref >60)
Glucose, Bld: 134 mg/dL — ABNORMAL HIGH (ref 70–99)
Potassium: 4 mmol/L (ref 3.5–5.1)
Sodium: 130 mmol/L — ABNORMAL LOW (ref 135–145)

## 2020-08-16 LAB — CBC
HCT: 31.5 % — ABNORMAL LOW (ref 36.0–46.0)
Hemoglobin: 10.8 g/dL — ABNORMAL LOW (ref 12.0–15.0)
MCH: 26.8 pg (ref 26.0–34.0)
MCHC: 34.3 g/dL (ref 30.0–36.0)
MCV: 78.2 fL — ABNORMAL LOW (ref 80.0–100.0)
Platelets: 323 10*3/uL (ref 150–400)
RBC: 4.03 MIL/uL (ref 3.87–5.11)
RDW: 13.5 % (ref 11.5–15.5)
WBC: 8.1 10*3/uL (ref 4.0–10.5)
nRBC: 0 % (ref 0.0–0.2)

## 2020-08-16 LAB — MAGNESIUM: Magnesium: 1.9 mg/dL (ref 1.7–2.4)

## 2020-08-16 LAB — C-REACTIVE PROTEIN: CRP: 9.8 mg/dL — ABNORMAL HIGH (ref <1.0)

## 2020-08-16 MED ORDER — CARVEDILOL 6.25 MG PO TABS
6.2500 mg | ORAL_TABLET | Freq: Every day | ORAL | Status: DC
  Administered 2020-08-16: 6.25 mg via ORAL
  Filled 2020-08-16: qty 1

## 2020-08-16 MED ORDER — DEXAMETHASONE 6 MG PO TABS: 6.0000 mg | ORAL_TABLET | Freq: Every day | ORAL | 0 refills | Status: AC

## 2020-08-16 MED ORDER — ALBUTEROL SULFATE HFA 108 (90 BASE) MCG/ACT IN AERS: 2.0000 | INHALATION_SPRAY | Freq: Four times a day (QID) | RESPIRATORY_TRACT | 0 refills | Status: DC | PRN

## 2020-08-16 MED ORDER — ATORVASTATIN CALCIUM 20 MG PO TABS: 20.0000 mg | ORAL_TABLET | Freq: Every day | ORAL | Status: DC

## 2020-08-16 MED ORDER — SEVELAMER CARBONATE 800 MG PO TABS
800.0000 mg | ORAL_TABLET | Freq: Three times a day (TID) | ORAL | Status: DC
  Administered 2020-08-16: 08:00:00 800 mg via ORAL
  Filled 2020-08-16 (×3): qty 1

## 2020-08-16 MED ORDER — TORSEMIDE 20 MG PO TABS: 40.0000 mg | ORAL_TABLET | Freq: Every day | ORAL | Status: DC

## 2020-08-16 MED ORDER — LISINOPRIL 20 MG PO TABS
40.0000 mg | ORAL_TABLET | Freq: Every day | ORAL | Status: DC
  Administered 2020-08-16: 40 mg via ORAL
  Filled 2020-08-16: qty 2

## 2020-08-16 MED ORDER — CLOPIDOGREL BISULFATE 75 MG PO TABS
75.0000 mg | ORAL_TABLET | Freq: Every day | ORAL | Status: DC
  Administered 2020-08-16: 08:00:00 75 mg via ORAL
  Filled 2020-08-16: qty 1

## 2020-08-16 MED ORDER — DEXAMETHASONE 4 MG PO TABS
6.0000 mg | ORAL_TABLET | Freq: Every day | ORAL | Status: DC
  Administered 2020-08-16: 08:00:00 6 mg via ORAL
  Filled 2020-08-16: qty 2

## 2020-08-16 MED ORDER — BENZONATATE 100 MG PO CAPS: 100.0000 mg | ORAL_CAPSULE | Freq: Three times a day (TID) | ORAL | 11 refills | Status: AC | PRN

## 2020-08-16 NOTE — Discharge Summary (Signed)
Physician Discharge Summary  Kathleen Sharp MAU:[id number] DOB: 01/11/1946 DOA: 08/14/2020  PCP: Adalberto Ill, MD  Admit date: 08/14/2020 Discharge date: 08/16/2020  Admitted From: Home Disposition: Home  Recommendations for Outpatient Follow-up:  1. Follow up with PCP in 1-2 weeks 2.   Home Health: No Equipment/Devices: None Discharge Condition: Stable CODE STATUS: Full Diet recommendation: Renal  Brief/Interim Summary: Kathleen Sharp is a 74 y.o. female with medical history significant for ESRD on hemodialysis Mondays and Fridays, hypertension, and hyperlipidemia, now presenting to the emergency department with almost 2 weeks of fatigue and general malaise.  She reports receiving her second COVID-19 vaccination in March 2021.  She denies any chest pain, leg swelling, or productive cough.  She is not aware of any fevers.  Patient received 3 doses of remdesivir in house.  She was weaned from submental oxygen.  She was ambulated around her room without desaturation.  She was urinating spontaneously.  She is in no visible distress.  She is stable for discharge home at this time.  Will complete 10-day course of oral steroids on discharge.  No indication to continue remdesivir at this time.  Communicated with nephrology, patient will keep her normal hemodialysis chair but will go on a different day.  She is aware of this.  Stable vitals at time of discharge.  Patient discharged home in stable condition.  Discharge Diagnoses:  Principal Problem:   Acute hypoxemic respiratory failure due to COVID-19 [facility]) Active Problems:   End stage renal disease ([place])   Essential hypertension  COVID-19 with acute hypoxic respiratory failure --found to have COVID-19, and dropped saturations to 88% on rm air -There was question of possible PNA in left base on CXR but no productive cough, leukocytosis, or elevation in procalcitonin and suspicion for bacterial PNA is low -Completed 3 days of  remdesivir in house- -completed 3 doses of Decadron.  Will prescribe 1 week of Decadron on discharge complete total 10-day course -Patient on room air at time of discharge -Ambulated around room without desaturation or respiratory distress  ESRDon HD M/F --pt completed HD just prior to arrival in ED --CXR showed Cardiomegaly with vascular congestion. -nephrology consult -No urgent HD in house -Communicated with nephrology at time of discharge -Patient will resume her normal hemodialysis schedule.  They are aware of Covid positive status  Hypertension -BP 150/90 on admission --cont home coreg and Lisinopril  Hyponatremia; hypokalemia -Mild hyponatremia and hypokalemia noted on admission -monitor for now.    Discharge Instructions  Discharge Instructions    Diet - low sodium heart healthy   Complete by: As directed    Increase activity slowly   Complete by: As directed      Allergies as of 08/16/2020   No Known Allergies     Medication List    STOP taking these medications   loratadine 10 MG tablet Commonly known as: CLARITIN     TAKE these medications   albuterol 108 (90 Base) MCG/ACT inhaler Commonly known as: VENTOLIN HFA Inhale 2 puffs into the lungs every 6 (six) hours as needed for wheezing or shortness of breath.   atorvastatin 20 MG tablet Commonly known as: LIPITOR Take 20 mg by mouth daily at 6 PM.   benzonatate 100 MG capsule Commonly known as: Tessalon Perles Take 1 capsule (100 mg total) by mouth 3 (three) times daily as needed for cough.   carvedilol 6.25 MG tablet Commonly known as: COREG Take 6.25 mg by mouth daily.   clopidogrel  75 MG tablet Commonly known as: PLAVIX Take 75 mg by mouth daily.   dexamethasone 6 MG tablet Commonly known as: DECADRON Take 1 tablet (6 mg total) by mouth daily for 8 days. Start taking on: August 17, 2020   lidocaine-prilocaine cream Commonly known as: EMLA APPLY TO ACCESS SITE 30 MINUTES  BEFORE TREATMENT   lisinopril 40 MG tablet Commonly known as: ZESTRIL Take 40 mg by mouth daily.   multivitamin Tabs tablet Take 1 tablet by mouth daily.   Renvela 800 MG tablet Generic drug: sevelamer carbonate Take 1 tablet by mouth 3 (three) times daily.       No Known Allergies  Consultations:  Nephrology   Procedures/Studies: DG Chest 2 View  Result Date: 08/14/2020 CLINICAL DATA:  Suspected sepsis, fever and weakness. EXAM: CHEST - 2 VIEW COMPARISON:  February of 2017 FINDINGS: Stenting in the LEFT axilla. Heart size is enlarged, increased size since previous imaging. Fullness of RIGHT and LEFT hila. Mild interstitial prominence throughout the chest. No lobar consolidative changes. Partially obscured LEFT hemidiaphragm on frontal view. Osteopenia without acute musculoskeletal process on limited assessment. IMPRESSION: 1. Cardiomegaly with vascular congestion. 2. Potential developing pneumonia in the LEFT lung base. This could also represent atelectasis given lack of clear correlate on lateral view. 3. Signs of vascular stenting in the LEFT axilla. Electronically Signed   By: Zetta Bills M.D.   On: 08/14/2020 16:18    (Echo, Carotid, EGD, Colonoscopy, ERCP)    Subjective: Seen and examined on the day of discharge.  Sitting up in the edge of the bed.  Speaking on the phone.  No visible distress.  No pain complaints.  On room air.  No shortness of breath.  Discharge Exam: Vitals:   08/16/20 0816 08/16/20 1132  BP:  (!) 129/95  Pulse:  67  Resp:  18  Temp:  98.7 F (37.1 C)  SpO2: 94% 100%   Vitals:   08/16/20 0507 08/16/20 0734 08/16/20 0816 08/16/20 1132  BP: (!) 156/94 (!) 156/80  (!) 129/95  Pulse: 72 69  67  Resp: $Remo'18 18  18  'XFrVZ$ Temp: 98.7 F (37.1 C) 98.5 F (36.9 C)  98.7 F (37.1 C)  TempSrc: Oral Oral  Oral  SpO2: 97% 100% 94% 100%  Weight:      Height:        General: Pt is alert, awake, not in acute distress Cardiovascular: RRR, S1/S2 +, no  rubs, no gallops Respiratory: Mild bibasilar crackles.  Normal work of breathing.  Room air Abdominal: Soft, NT, ND, bowel sounds + Extremities: no edema, no cyanosis, LUE AVF + thrill    The results of significant diagnostics from this hospitalization (including imaging, microbiology, ancillary and laboratory) are listed below for reference.     Microbiology: Recent Results (from the past 240 hour(s))  Culture, blood (Routine x 2)     Status: None (Preliminary result)   Collection Time: 08/14/20  2:58 PM   Specimen: BLOOD  Result Value Ref Range Status   Specimen Description BLOOD BLOOD RIGHT WRIST  Final   Special Requests   Final    BOTTLES DRAWN AEROBIC AND ANAEROBIC Blood Culture results may not be optimal due to an inadequate volume of blood received in culture bottles   Culture   Final    NO GROWTH 2 DAYS Performed at [facility], [address], [place], [place] [address]    Report Status PENDING  Incomplete  Culture, blood (Routine x 2)  Status: None (Preliminary result)   Collection Time: 08/14/20  2:58 PM   Specimen: BLOOD  Result Value Ref Range Status   Specimen Description BLOOD BLOOD RIGHT ARM  Final   Special Requests   Final    BOTTLES DRAWN AEROBIC AND ANAEROBIC Blood Culture results may not be optimal due to an inadequate volume of blood received in culture bottles   Culture   Final    NO GROWTH 2 DAYS Performed at [facility], [address], [place], [place] [address]    Report Status PENDING  Incomplete  Resp Panel by RT-PCR (Flu A&B, Covid) Nasopharyngeal Swab     Status: Abnormal   Collection Time: 08/14/20  5:12 PM   Specimen: Nasopharyngeal Swab; Nasopharyngeal(NP) swabs in vial transport medium  Result Value Ref Range Status   SARS Coronavirus 2 by RT PCR POSITIVE (A) NEGATIVE Final    Comment: RESULT CALLED TO, READ BACK BY AND VERIFIED WITH: JENNIFER AGNEW 08/14/20 AT 1856 BY ACR (NOTE) SARS-CoV-2 target nucleic acids  are DETECTED.  The SARS-CoV-2 RNA is generally detectable in upper respiratory specimens during the acute phase of infection. Positive results are indicative of the presence of the identified virus, but do not rule out bacterial infection or co-infection with other pathogens not detected by the test. Clinical correlation with patient history and other diagnostic information is necessary to determine patient infection status. The expected result is Negative.  Fact Sheet for Patients: EntrepreneurPulse.com.au  Fact Sheet for Healthcare Providers: IncredibleEmployment.be  This test is not yet approved or cleared by the [place] FDA and  has been authorized for detection and/or diagnosis of SARS-CoV-2 by FDA under an Emergency Use Authorization (EUA).  This EUA will remain in effect (meaning this test c an be used) for the duration of  the COVID-19 declaration under Section 564(b)(1) of the Act, 21 U.S.C. section 360bbb-3(b)(1), unless the authorization is terminated or revoked sooner.     Influenza A by PCR NEGATIVE NEGATIVE Final   Influenza B by PCR NEGATIVE NEGATIVE Final    Comment: (NOTE) The Xpert Xpress SARS-CoV-2/FLU/RSV plus assay is intended as an aid in the diagnosis of influenza from Nasopharyngeal swab specimens and should not be used as a sole basis for treatment. Nasal washings and aspirates are unacceptable for Xpert Xpress SARS-CoV-2/FLU/RSV testing.  Fact Sheet for Patients: EntrepreneurPulse.com.au  Fact Sheet for Healthcare Providers: IncredibleEmployment.be  This test is not yet approved or cleared by the [place] FDA and has been authorized for detection and/or diagnosis of SARS-CoV-2 by FDA under an Emergency Use Authorization (EUA). This EUA will remain in effect (meaning this test can be used) for the duration of the COVID-19 declaration under Section 564(b)(1) of the Act,  21 U.S.C. section 360bbb-3(b)(1), unless the authorization is terminated or revoked.  Performed at [facility], [address] [place], [place] [address]      Labs: BNP (last 3 results) No results for input(s): BNP in the last 8760 hours. Basic Metabolic Panel: Recent Labs  Lab 08/14/20 1457 08/15/20 0711 08/16/20 0504  NA 132* 134* 130*  K 3.3* 3.8 4.0  CL 94* 94* 92*  CO2 23 24 21*  GLUCOSE 91 119* 134*  BUN 24* 35* 54*  CREATININE 6.57* 8.35* 9.94*  CALCIUM 8.2* 8.3* 8.0*  MG  --  2.0 1.9   Liver Function Tests: Recent Labs  Lab 08/14/20 1457 08/15/20 0711  AST 28 25  ALT 15 15  ALKPHOS 46 41  BILITOT  0.6 0.6  PROT 7.6 7.1  ALBUMIN 3.5 3.2*   No results for input(s): LIPASE, AMYLASE in the last 168 hours. No results for input(s): AMMONIA in the last 168 hours. CBC: Recent Labs  Lab 08/14/20 1540 08/15/20 0711 08/16/20 0504  WBC 6.4 6.1 8.1  NEUTROABS 4.8 4.8  --   HGB 12.1 10.4* 10.8*  HCT 36.2 31.7* 31.5*  MCV 78.5* 79.8* 78.2*  PLT 243 267 323   Cardiac Enzymes: No results for input(s): CKTOTAL, CKMB, CKMBINDEX, TROPONINI in the last 168 hours. BNP: Invalid input(s): POCBNP CBG: No results for input(s): GLUCAP in the last 168 hours. D-Dimer Recent Labs    08/14/20 2300 08/15/20 0711  DDIMER 1.10* 1.19*   Hgb A1c No results for input(s): HGBA1C in the last 72 hours. Lipid Profile No results for input(s): CHOL, HDL, LDLCALC, TRIG, CHOLHDL, LDLDIRECT in the last 72 hours. Thyroid function studies No results for input(s): TSH, T4TOTAL, T3FREE, THYROIDAB in the last 72 hours.  Invalid input(s): FREET3 Anemia work up Recent Labs    08/15/20 0711  FERRITIN 2,330*   Urinalysis    Component Value Date/Time   COLORURINE YELLOW (A) 08/14/2020 1447   APPEARANCEUR CLOUDY (A) 08/14/2020 1447   LABSPEC 1.011 08/14/2020 1447   PHURINE 8.0 08/14/2020 1447   GLUCOSEU 50 (A) 08/14/2020 1447   HGBUR NEGATIVE 08/14/2020 1447    [place] 08/14/2020 1447   KETONESUR NEGATIVE 08/14/2020 1447   PROTEINUR >=300 (A) 08/14/2020 1447   NITRITE NEGATIVE 08/14/2020 1447   LEUKOCYTESUR NEGATIVE 08/14/2020 1447   Sepsis Labs Invalid input(s): PROCALCITONIN,  WBC,  LACTICIDVEN Microbiology Recent Results (from the past 240 hour(s))  Culture, blood (Routine x 2)     Status: None (Preliminary result)   Collection Time: 08/14/20  2:58 PM   Specimen: BLOOD  Result Value Ref Range Status   Specimen Description BLOOD BLOOD RIGHT WRIST  Final   Special Requests   Final    BOTTLES DRAWN AEROBIC AND ANAEROBIC Blood Culture results may not be optimal due to an inadequate volume of blood received in culture bottles   Culture   Final    NO GROWTH 2 DAYS Performed at [facility], [address], [place], [place] [address]    Report Status PENDING  Incomplete  Culture, blood (Routine x 2)     Status: None (Preliminary result)   Collection Time: 08/14/20  2:58 PM   Specimen: BLOOD  Result Value Ref Range Status   Specimen Description BLOOD BLOOD RIGHT ARM  Final   Special Requests   Final    BOTTLES DRAWN AEROBIC AND ANAEROBIC Blood Culture results may not be optimal due to an inadequate volume of blood received in culture bottles   Culture   Final    NO GROWTH 2 DAYS Performed at [facility], [address], [place], [place] [address]    Report Status PENDING  Incomplete  Resp Panel by RT-PCR (Flu A&B, Covid) Nasopharyngeal Swab     Status: Abnormal   Collection Time: 08/14/20  5:12 PM   Specimen: Nasopharyngeal Swab; Nasopharyngeal(NP) swabs in vial transport medium  Result Value Ref Range Status   SARS Coronavirus 2 by RT PCR POSITIVE (A) NEGATIVE Final    Comment: RESULT CALLED TO, READ BACK BY AND VERIFIED WITH: JENNIFER AGNEW 08/14/20 AT 1856 BY ACR (NOTE) SARS-CoV-2 target nucleic acids are DETECTED.  The SARS-CoV-2 RNA is generally detectable in upper respiratory specimens during  the acute phase of infection. Positive  results are indicative of the presence of the identified virus, but do not rule out bacterial infection or co-infection with other pathogens not detected by the test. Clinical correlation with patient history and other diagnostic information is necessary to determine patient infection status. The expected result is Negative.  Fact Sheet for Patients: EntrepreneurPulse.com.au  Fact Sheet for Healthcare Providers: IncredibleEmployment.be  This test is not yet approved or cleared by the [place] FDA and  has been authorized for detection and/or diagnosis of SARS-CoV-2 by FDA under an Emergency Use Authorization (EUA).  This EUA will remain in effect (meaning this test c an be used) for the duration of  the COVID-19 declaration under Section 564(b)(1) of the Act, 21 U.S.C. section 360bbb-3(b)(1), unless the authorization is terminated or revoked sooner.     Influenza A by PCR NEGATIVE NEGATIVE Final   Influenza B by PCR NEGATIVE NEGATIVE Final    Comment: (NOTE) The Xpert Xpress SARS-CoV-2/FLU/RSV plus assay is intended as an aid in the diagnosis of influenza from Nasopharyngeal swab specimens and should not be used as a sole basis for treatment. Nasal washings and aspirates are unacceptable for Xpert Xpress SARS-CoV-2/FLU/RSV testing.  Fact Sheet for Patients: EntrepreneurPulse.com.au  Fact Sheet for Healthcare Providers: IncredibleEmployment.be  This test is not yet approved or cleared by the [place] FDA and has been authorized for detection and/or diagnosis of SARS-CoV-2 by FDA under an Emergency Use Authorization (EUA). This EUA will remain in effect (meaning this test can be used) for the duration of the COVID-19 declaration under Section 564(b)(1) of the Act, 21 U.S.C. section 360bbb-3(b)(1), unless the authorization is terminated  or revoked.  Performed at [facility], [address] [place], [place] [address]      Time coordinating discharge: Over 30 minutes  SIGNED:   Sidney Ace, MD  [facility] 08/16/2020, 12:45 PM Pager   If 7PM-7AM, please contact night-coverage

## 2020-08-16 NOTE — Progress Notes (Signed)
Patient will go to home clinic [place] on Saturday 12/4 at 3:30pm for isolation shift. A detailed noted was handed off to tech, which was given to patient providing this information. Please reiterate plan at discharge. Please contact me with any questions.  Elvera Bicker Dialysis Coordinator [phone]

## 2020-08-16 NOTE — Progress Notes (Signed)
Aware that patient is COVID Positive and ready for discharge today. Waiting for chair time at [facility] [place]. Will update soon. Please call me with any questions.  Elvera Bicker Dialysis Coordinator [phone]

## 2020-08-16 NOTE — Progress Notes (Signed)
Patient given discharge instructions. Pt verbalized understanding of all instructions including where/when to have outpatient dialysis.

## 2020-08-19 DIAGNOSIS — N2581 Secondary hyperparathyroidism of renal origin: Secondary | ICD-10-CM | POA: Diagnosis not present

## 2020-08-19 DIAGNOSIS — Z992 Dependence on renal dialysis: Secondary | ICD-10-CM | POA: Diagnosis not present

## 2020-08-19 DIAGNOSIS — N186 End stage renal disease: Secondary | ICD-10-CM | POA: Diagnosis not present

## 2020-08-19 LAB — CULTURE, BLOOD (ROUTINE X 2)
Culture: NO GROWTH
Culture: NO GROWTH

## 2020-08-25 DIAGNOSIS — N2581 Secondary hyperparathyroidism of renal origin: Secondary | ICD-10-CM | POA: Diagnosis not present

## 2020-08-25 DIAGNOSIS — N186 End stage renal disease: Secondary | ICD-10-CM | POA: Diagnosis not present

## 2020-08-25 DIAGNOSIS — Z992 Dependence on renal dialysis: Secondary | ICD-10-CM | POA: Diagnosis not present

## 2020-08-28 DIAGNOSIS — N186 End stage renal disease: Secondary | ICD-10-CM | POA: Diagnosis not present

## 2020-08-28 DIAGNOSIS — N2581 Secondary hyperparathyroidism of renal origin: Secondary | ICD-10-CM | POA: Diagnosis not present

## 2020-08-28 DIAGNOSIS — Z992 Dependence on renal dialysis: Secondary | ICD-10-CM | POA: Diagnosis not present

## 2020-09-01 DIAGNOSIS — N2581 Secondary hyperparathyroidism of renal origin: Secondary | ICD-10-CM | POA: Diagnosis not present

## 2020-09-01 DIAGNOSIS — Z992 Dependence on renal dialysis: Secondary | ICD-10-CM | POA: Diagnosis not present

## 2020-09-01 DIAGNOSIS — N186 End stage renal disease: Secondary | ICD-10-CM | POA: Diagnosis not present

## 2020-09-05 DIAGNOSIS — Z992 Dependence on renal dialysis: Secondary | ICD-10-CM | POA: Diagnosis not present

## 2020-09-05 DIAGNOSIS — N2581 Secondary hyperparathyroidism of renal origin: Secondary | ICD-10-CM | POA: Diagnosis not present

## 2020-09-05 DIAGNOSIS — N186 End stage renal disease: Secondary | ICD-10-CM | POA: Diagnosis not present

## 2020-09-08 DIAGNOSIS — Z992 Dependence on renal dialysis: Secondary | ICD-10-CM | POA: Diagnosis not present

## 2020-09-08 DIAGNOSIS — N2581 Secondary hyperparathyroidism of renal origin: Secondary | ICD-10-CM | POA: Diagnosis not present

## 2020-09-08 DIAGNOSIS — N186 End stage renal disease: Secondary | ICD-10-CM | POA: Diagnosis not present

## 2020-09-11 DIAGNOSIS — N186 End stage renal disease: Secondary | ICD-10-CM | POA: Diagnosis not present

## 2020-09-11 DIAGNOSIS — Z992 Dependence on renal dialysis: Secondary | ICD-10-CM | POA: Diagnosis not present

## 2020-09-12 DIAGNOSIS — Z992 Dependence on renal dialysis: Secondary | ICD-10-CM | POA: Diagnosis not present

## 2020-09-12 DIAGNOSIS — N2581 Secondary hyperparathyroidism of renal origin: Secondary | ICD-10-CM | POA: Diagnosis not present

## 2020-09-12 DIAGNOSIS — N186 End stage renal disease: Secondary | ICD-10-CM | POA: Diagnosis not present

## 2020-09-15 DIAGNOSIS — N186 End stage renal disease: Secondary | ICD-10-CM | POA: Diagnosis not present

## 2020-09-15 DIAGNOSIS — Z992 Dependence on renal dialysis: Secondary | ICD-10-CM | POA: Diagnosis not present

## 2020-09-17 DIAGNOSIS — N2581 Secondary hyperparathyroidism of renal origin: Secondary | ICD-10-CM | POA: Diagnosis not present

## 2020-09-17 DIAGNOSIS — N186 End stage renal disease: Secondary | ICD-10-CM | POA: Diagnosis not present

## 2020-09-17 DIAGNOSIS — Z992 Dependence on renal dialysis: Secondary | ICD-10-CM | POA: Diagnosis not present

## 2020-09-19 DIAGNOSIS — N186 End stage renal disease: Secondary | ICD-10-CM | POA: Diagnosis not present

## 2020-09-19 DIAGNOSIS — Z992 Dependence on renal dialysis: Secondary | ICD-10-CM | POA: Diagnosis not present

## 2020-09-19 DIAGNOSIS — N2581 Secondary hyperparathyroidism of renal origin: Secondary | ICD-10-CM | POA: Diagnosis not present

## 2020-09-22 DIAGNOSIS — N2581 Secondary hyperparathyroidism of renal origin: Secondary | ICD-10-CM | POA: Diagnosis not present

## 2020-09-22 DIAGNOSIS — N186 End stage renal disease: Secondary | ICD-10-CM | POA: Diagnosis not present

## 2020-09-22 DIAGNOSIS — Z992 Dependence on renal dialysis: Secondary | ICD-10-CM | POA: Diagnosis not present

## 2020-09-25 DIAGNOSIS — N186 End stage renal disease: Secondary | ICD-10-CM | POA: Diagnosis not present

## 2020-09-25 DIAGNOSIS — N2581 Secondary hyperparathyroidism of renal origin: Secondary | ICD-10-CM | POA: Diagnosis not present

## 2020-09-25 DIAGNOSIS — Z992 Dependence on renal dialysis: Secondary | ICD-10-CM | POA: Diagnosis not present

## 2020-09-29 DIAGNOSIS — N186 End stage renal disease: Secondary | ICD-10-CM | POA: Diagnosis not present

## 2020-09-29 DIAGNOSIS — N2581 Secondary hyperparathyroidism of renal origin: Secondary | ICD-10-CM | POA: Diagnosis not present

## 2020-09-29 DIAGNOSIS — Z992 Dependence on renal dialysis: Secondary | ICD-10-CM | POA: Diagnosis not present

## 2020-10-03 DIAGNOSIS — N2581 Secondary hyperparathyroidism of renal origin: Secondary | ICD-10-CM | POA: Diagnosis not present

## 2020-10-03 DIAGNOSIS — N186 End stage renal disease: Secondary | ICD-10-CM | POA: Diagnosis not present

## 2020-10-03 DIAGNOSIS — Z992 Dependence on renal dialysis: Secondary | ICD-10-CM | POA: Diagnosis not present

## 2020-10-05 DIAGNOSIS — N186 End stage renal disease: Secondary | ICD-10-CM | POA: Diagnosis not present

## 2020-10-05 DIAGNOSIS — N2581 Secondary hyperparathyroidism of renal origin: Secondary | ICD-10-CM | POA: Diagnosis not present

## 2020-10-05 DIAGNOSIS — Z992 Dependence on renal dialysis: Secondary | ICD-10-CM | POA: Diagnosis not present

## 2020-10-11 DIAGNOSIS — Z992 Dependence on renal dialysis: Secondary | ICD-10-CM | POA: Diagnosis not present

## 2020-10-11 DIAGNOSIS — N186 End stage renal disease: Secondary | ICD-10-CM | POA: Diagnosis not present

## 2020-10-11 DIAGNOSIS — N2581 Secondary hyperparathyroidism of renal origin: Secondary | ICD-10-CM | POA: Diagnosis not present

## 2020-10-13 DIAGNOSIS — N2581 Secondary hyperparathyroidism of renal origin: Secondary | ICD-10-CM | POA: Diagnosis not present

## 2020-10-13 DIAGNOSIS — N186 End stage renal disease: Secondary | ICD-10-CM | POA: Diagnosis not present

## 2020-10-13 DIAGNOSIS — Z992 Dependence on renal dialysis: Secondary | ICD-10-CM | POA: Diagnosis not present

## 2020-10-16 DIAGNOSIS — Z992 Dependence on renal dialysis: Secondary | ICD-10-CM | POA: Diagnosis not present

## 2020-10-16 DIAGNOSIS — N186 End stage renal disease: Secondary | ICD-10-CM | POA: Diagnosis not present

## 2020-10-16 DIAGNOSIS — N2581 Secondary hyperparathyroidism of renal origin: Secondary | ICD-10-CM | POA: Diagnosis not present

## 2020-10-20 DIAGNOSIS — N186 End stage renal disease: Secondary | ICD-10-CM | POA: Diagnosis not present

## 2020-10-20 DIAGNOSIS — Z992 Dependence on renal dialysis: Secondary | ICD-10-CM | POA: Diagnosis not present

## 2020-10-20 DIAGNOSIS — N2581 Secondary hyperparathyroidism of renal origin: Secondary | ICD-10-CM | POA: Diagnosis not present

## 2020-10-24 DIAGNOSIS — N2581 Secondary hyperparathyroidism of renal origin: Secondary | ICD-10-CM | POA: Diagnosis not present

## 2020-10-24 DIAGNOSIS — N186 End stage renal disease: Secondary | ICD-10-CM | POA: Diagnosis not present

## 2020-10-24 DIAGNOSIS — Z992 Dependence on renal dialysis: Secondary | ICD-10-CM | POA: Diagnosis not present

## 2020-10-27 DIAGNOSIS — N2581 Secondary hyperparathyroidism of renal origin: Secondary | ICD-10-CM | POA: Diagnosis not present

## 2020-10-27 DIAGNOSIS — Z992 Dependence on renal dialysis: Secondary | ICD-10-CM | POA: Diagnosis not present

## 2020-10-27 DIAGNOSIS — N186 End stage renal disease: Secondary | ICD-10-CM | POA: Diagnosis not present

## 2020-10-30 DIAGNOSIS — N2581 Secondary hyperparathyroidism of renal origin: Secondary | ICD-10-CM | POA: Diagnosis not present

## 2020-10-30 DIAGNOSIS — N186 End stage renal disease: Secondary | ICD-10-CM | POA: Diagnosis not present

## 2020-10-30 DIAGNOSIS — Z992 Dependence on renal dialysis: Secondary | ICD-10-CM | POA: Diagnosis not present

## 2020-11-03 DIAGNOSIS — N186 End stage renal disease: Secondary | ICD-10-CM | POA: Diagnosis not present

## 2020-11-03 DIAGNOSIS — Z992 Dependence on renal dialysis: Secondary | ICD-10-CM | POA: Diagnosis not present

## 2020-11-03 DIAGNOSIS — N2581 Secondary hyperparathyroidism of renal origin: Secondary | ICD-10-CM | POA: Diagnosis not present

## 2020-11-06 DIAGNOSIS — N2581 Secondary hyperparathyroidism of renal origin: Secondary | ICD-10-CM | POA: Diagnosis not present

## 2020-11-06 DIAGNOSIS — N186 End stage renal disease: Secondary | ICD-10-CM | POA: Diagnosis not present

## 2020-11-06 DIAGNOSIS — Z992 Dependence on renal dialysis: Secondary | ICD-10-CM | POA: Diagnosis not present

## 2020-11-10 DIAGNOSIS — N186 End stage renal disease: Secondary | ICD-10-CM | POA: Diagnosis not present

## 2020-11-10 DIAGNOSIS — Z992 Dependence on renal dialysis: Secondary | ICD-10-CM | POA: Diagnosis not present

## 2020-11-10 DIAGNOSIS — N2581 Secondary hyperparathyroidism of renal origin: Secondary | ICD-10-CM | POA: Diagnosis not present

## 2020-11-13 DIAGNOSIS — N186 End stage renal disease: Secondary | ICD-10-CM | POA: Diagnosis not present

## 2020-11-13 DIAGNOSIS — Z992 Dependence on renal dialysis: Secondary | ICD-10-CM | POA: Diagnosis not present

## 2020-11-14 DIAGNOSIS — N186 End stage renal disease: Secondary | ICD-10-CM | POA: Diagnosis not present

## 2020-11-14 DIAGNOSIS — Z992 Dependence on renal dialysis: Secondary | ICD-10-CM | POA: Diagnosis not present

## 2020-11-14 DIAGNOSIS — N2581 Secondary hyperparathyroidism of renal origin: Secondary | ICD-10-CM | POA: Diagnosis not present

## 2020-11-17 DIAGNOSIS — N186 End stage renal disease: Secondary | ICD-10-CM | POA: Diagnosis not present

## 2020-11-17 DIAGNOSIS — N2581 Secondary hyperparathyroidism of renal origin: Secondary | ICD-10-CM | POA: Diagnosis not present

## 2020-11-17 DIAGNOSIS — Z992 Dependence on renal dialysis: Secondary | ICD-10-CM | POA: Diagnosis not present

## 2020-11-20 DIAGNOSIS — N2581 Secondary hyperparathyroidism of renal origin: Secondary | ICD-10-CM | POA: Diagnosis not present

## 2020-11-20 DIAGNOSIS — Z992 Dependence on renal dialysis: Secondary | ICD-10-CM | POA: Diagnosis not present

## 2020-11-20 DIAGNOSIS — N186 End stage renal disease: Secondary | ICD-10-CM | POA: Diagnosis not present

## 2020-11-24 DIAGNOSIS — N186 End stage renal disease: Secondary | ICD-10-CM | POA: Diagnosis not present

## 2020-11-24 DIAGNOSIS — Z992 Dependence on renal dialysis: Secondary | ICD-10-CM | POA: Diagnosis not present

## 2020-11-24 DIAGNOSIS — N2581 Secondary hyperparathyroidism of renal origin: Secondary | ICD-10-CM | POA: Diagnosis not present

## 2020-11-27 DIAGNOSIS — N2581 Secondary hyperparathyroidism of renal origin: Secondary | ICD-10-CM | POA: Diagnosis not present

## 2020-11-27 DIAGNOSIS — Z992 Dependence on renal dialysis: Secondary | ICD-10-CM | POA: Diagnosis not present

## 2020-11-27 DIAGNOSIS — N186 End stage renal disease: Secondary | ICD-10-CM | POA: Diagnosis not present

## 2020-12-01 DIAGNOSIS — N186 End stage renal disease: Secondary | ICD-10-CM | POA: Diagnosis not present

## 2020-12-01 DIAGNOSIS — Z992 Dependence on renal dialysis: Secondary | ICD-10-CM | POA: Diagnosis not present

## 2020-12-01 DIAGNOSIS — N2581 Secondary hyperparathyroidism of renal origin: Secondary | ICD-10-CM | POA: Diagnosis not present

## 2020-12-04 DIAGNOSIS — Z992 Dependence on renal dialysis: Secondary | ICD-10-CM | POA: Diagnosis not present

## 2020-12-04 DIAGNOSIS — N2581 Secondary hyperparathyroidism of renal origin: Secondary | ICD-10-CM | POA: Diagnosis not present

## 2020-12-04 DIAGNOSIS — N186 End stage renal disease: Secondary | ICD-10-CM | POA: Diagnosis not present

## 2020-12-08 DIAGNOSIS — N186 End stage renal disease: Secondary | ICD-10-CM | POA: Diagnosis not present

## 2020-12-08 DIAGNOSIS — Z992 Dependence on renal dialysis: Secondary | ICD-10-CM | POA: Diagnosis not present

## 2020-12-08 DIAGNOSIS — N2581 Secondary hyperparathyroidism of renal origin: Secondary | ICD-10-CM | POA: Diagnosis not present

## 2020-12-11 DIAGNOSIS — N2581 Secondary hyperparathyroidism of renal origin: Secondary | ICD-10-CM | POA: Diagnosis not present

## 2020-12-11 DIAGNOSIS — N186 End stage renal disease: Secondary | ICD-10-CM | POA: Diagnosis not present

## 2020-12-11 DIAGNOSIS — Z992 Dependence on renal dialysis: Secondary | ICD-10-CM | POA: Diagnosis not present

## 2020-12-14 DIAGNOSIS — N186 End stage renal disease: Secondary | ICD-10-CM | POA: Diagnosis not present

## 2020-12-14 DIAGNOSIS — Z992 Dependence on renal dialysis: Secondary | ICD-10-CM | POA: Diagnosis not present

## 2020-12-15 DIAGNOSIS — Z992 Dependence on renal dialysis: Secondary | ICD-10-CM | POA: Diagnosis not present

## 2020-12-15 DIAGNOSIS — N2581 Secondary hyperparathyroidism of renal origin: Secondary | ICD-10-CM | POA: Diagnosis not present

## 2020-12-15 DIAGNOSIS — N186 End stage renal disease: Secondary | ICD-10-CM | POA: Diagnosis not present

## 2020-12-18 DIAGNOSIS — Z992 Dependence on renal dialysis: Secondary | ICD-10-CM | POA: Diagnosis not present

## 2020-12-18 DIAGNOSIS — N2581 Secondary hyperparathyroidism of renal origin: Secondary | ICD-10-CM | POA: Diagnosis not present

## 2020-12-18 DIAGNOSIS — N186 End stage renal disease: Secondary | ICD-10-CM | POA: Diagnosis not present

## 2020-12-22 DIAGNOSIS — N186 End stage renal disease: Secondary | ICD-10-CM | POA: Diagnosis not present

## 2020-12-22 DIAGNOSIS — N2581 Secondary hyperparathyroidism of renal origin: Secondary | ICD-10-CM | POA: Diagnosis not present

## 2020-12-22 DIAGNOSIS — Z992 Dependence on renal dialysis: Secondary | ICD-10-CM | POA: Diagnosis not present

## 2020-12-25 DIAGNOSIS — N2581 Secondary hyperparathyroidism of renal origin: Secondary | ICD-10-CM | POA: Diagnosis not present

## 2020-12-25 DIAGNOSIS — N186 End stage renal disease: Secondary | ICD-10-CM | POA: Diagnosis not present

## 2020-12-25 DIAGNOSIS — Z992 Dependence on renal dialysis: Secondary | ICD-10-CM | POA: Diagnosis not present

## 2020-12-29 DIAGNOSIS — Z992 Dependence on renal dialysis: Secondary | ICD-10-CM | POA: Diagnosis not present

## 2020-12-29 DIAGNOSIS — N2581 Secondary hyperparathyroidism of renal origin: Secondary | ICD-10-CM | POA: Diagnosis not present

## 2020-12-29 DIAGNOSIS — N186 End stage renal disease: Secondary | ICD-10-CM | POA: Diagnosis not present

## 2021-01-01 DIAGNOSIS — N186 End stage renal disease: Secondary | ICD-10-CM | POA: Diagnosis not present

## 2021-01-01 DIAGNOSIS — N2581 Secondary hyperparathyroidism of renal origin: Secondary | ICD-10-CM | POA: Diagnosis not present

## 2021-01-01 DIAGNOSIS — Z992 Dependence on renal dialysis: Secondary | ICD-10-CM | POA: Diagnosis not present

## 2021-01-05 DIAGNOSIS — Z992 Dependence on renal dialysis: Secondary | ICD-10-CM | POA: Diagnosis not present

## 2021-01-05 DIAGNOSIS — N2581 Secondary hyperparathyroidism of renal origin: Secondary | ICD-10-CM | POA: Diagnosis not present

## 2021-01-05 DIAGNOSIS — N186 End stage renal disease: Secondary | ICD-10-CM | POA: Diagnosis not present

## 2021-01-08 DIAGNOSIS — N186 End stage renal disease: Secondary | ICD-10-CM | POA: Diagnosis not present

## 2021-01-08 DIAGNOSIS — N2581 Secondary hyperparathyroidism of renal origin: Secondary | ICD-10-CM | POA: Diagnosis not present

## 2021-01-08 DIAGNOSIS — Z992 Dependence on renal dialysis: Secondary | ICD-10-CM | POA: Diagnosis not present

## 2021-01-12 DIAGNOSIS — N186 End stage renal disease: Secondary | ICD-10-CM | POA: Diagnosis not present

## 2021-01-12 DIAGNOSIS — N2581 Secondary hyperparathyroidism of renal origin: Secondary | ICD-10-CM | POA: Diagnosis not present

## 2021-01-12 DIAGNOSIS — Z992 Dependence on renal dialysis: Secondary | ICD-10-CM | POA: Diagnosis not present

## 2021-01-13 DIAGNOSIS — N186 End stage renal disease: Secondary | ICD-10-CM | POA: Diagnosis not present

## 2021-01-13 DIAGNOSIS — Z992 Dependence on renal dialysis: Secondary | ICD-10-CM | POA: Diagnosis not present

## 2021-01-15 DIAGNOSIS — N2581 Secondary hyperparathyroidism of renal origin: Secondary | ICD-10-CM | POA: Diagnosis not present

## 2021-01-15 DIAGNOSIS — N186 End stage renal disease: Secondary | ICD-10-CM | POA: Diagnosis not present

## 2021-01-15 DIAGNOSIS — Z992 Dependence on renal dialysis: Secondary | ICD-10-CM | POA: Diagnosis not present

## 2021-01-19 DIAGNOSIS — Z992 Dependence on renal dialysis: Secondary | ICD-10-CM | POA: Diagnosis not present

## 2021-01-19 DIAGNOSIS — N2581 Secondary hyperparathyroidism of renal origin: Secondary | ICD-10-CM | POA: Diagnosis not present

## 2021-01-19 DIAGNOSIS — N186 End stage renal disease: Secondary | ICD-10-CM | POA: Diagnosis not present

## 2021-01-22 DIAGNOSIS — Z992 Dependence on renal dialysis: Secondary | ICD-10-CM | POA: Diagnosis not present

## 2021-01-22 DIAGNOSIS — N2581 Secondary hyperparathyroidism of renal origin: Secondary | ICD-10-CM | POA: Diagnosis not present

## 2021-01-22 DIAGNOSIS — N186 End stage renal disease: Secondary | ICD-10-CM | POA: Diagnosis not present

## 2021-01-26 DIAGNOSIS — N2581 Secondary hyperparathyroidism of renal origin: Secondary | ICD-10-CM | POA: Diagnosis not present

## 2021-01-26 DIAGNOSIS — N186 End stage renal disease: Secondary | ICD-10-CM | POA: Diagnosis not present

## 2021-01-26 DIAGNOSIS — Z992 Dependence on renal dialysis: Secondary | ICD-10-CM | POA: Diagnosis not present

## 2021-01-29 DIAGNOSIS — N186 End stage renal disease: Secondary | ICD-10-CM | POA: Diagnosis not present

## 2021-01-29 DIAGNOSIS — N2581 Secondary hyperparathyroidism of renal origin: Secondary | ICD-10-CM | POA: Diagnosis not present

## 2021-01-29 DIAGNOSIS — Z992 Dependence on renal dialysis: Secondary | ICD-10-CM | POA: Diagnosis not present

## 2021-02-02 DIAGNOSIS — Z992 Dependence on renal dialysis: Secondary | ICD-10-CM | POA: Diagnosis not present

## 2021-02-02 DIAGNOSIS — N186 End stage renal disease: Secondary | ICD-10-CM | POA: Diagnosis not present

## 2021-02-02 DIAGNOSIS — N2581 Secondary hyperparathyroidism of renal origin: Secondary | ICD-10-CM | POA: Diagnosis not present

## 2021-02-05 DIAGNOSIS — N2581 Secondary hyperparathyroidism of renal origin: Secondary | ICD-10-CM | POA: Diagnosis not present

## 2021-02-05 DIAGNOSIS — Z992 Dependence on renal dialysis: Secondary | ICD-10-CM | POA: Diagnosis not present

## 2021-02-05 DIAGNOSIS — N186 End stage renal disease: Secondary | ICD-10-CM | POA: Diagnosis not present

## 2021-02-09 DIAGNOSIS — N2581 Secondary hyperparathyroidism of renal origin: Secondary | ICD-10-CM | POA: Diagnosis not present

## 2021-02-09 DIAGNOSIS — N186 End stage renal disease: Secondary | ICD-10-CM | POA: Diagnosis not present

## 2021-02-09 DIAGNOSIS — Z992 Dependence on renal dialysis: Secondary | ICD-10-CM | POA: Diagnosis not present

## 2021-02-12 DIAGNOSIS — Z992 Dependence on renal dialysis: Secondary | ICD-10-CM | POA: Diagnosis not present

## 2021-02-12 DIAGNOSIS — N2581 Secondary hyperparathyroidism of renal origin: Secondary | ICD-10-CM | POA: Diagnosis not present

## 2021-02-12 DIAGNOSIS — N186 End stage renal disease: Secondary | ICD-10-CM | POA: Diagnosis not present

## 2021-02-13 DIAGNOSIS — N186 End stage renal disease: Secondary | ICD-10-CM | POA: Diagnosis not present

## 2021-02-13 DIAGNOSIS — Z992 Dependence on renal dialysis: Secondary | ICD-10-CM | POA: Diagnosis not present

## 2021-02-16 DIAGNOSIS — N186 End stage renal disease: Secondary | ICD-10-CM | POA: Diagnosis not present

## 2021-02-16 DIAGNOSIS — N2581 Secondary hyperparathyroidism of renal origin: Secondary | ICD-10-CM | POA: Diagnosis not present

## 2021-02-16 DIAGNOSIS — Z992 Dependence on renal dialysis: Secondary | ICD-10-CM | POA: Diagnosis not present

## 2021-02-19 DIAGNOSIS — N186 End stage renal disease: Secondary | ICD-10-CM | POA: Diagnosis not present

## 2021-02-19 DIAGNOSIS — N2581 Secondary hyperparathyroidism of renal origin: Secondary | ICD-10-CM | POA: Diagnosis not present

## 2021-02-19 DIAGNOSIS — Z992 Dependence on renal dialysis: Secondary | ICD-10-CM | POA: Diagnosis not present

## 2021-02-23 DIAGNOSIS — Z992 Dependence on renal dialysis: Secondary | ICD-10-CM | POA: Diagnosis not present

## 2021-02-23 DIAGNOSIS — N2581 Secondary hyperparathyroidism of renal origin: Secondary | ICD-10-CM | POA: Diagnosis not present

## 2021-02-23 DIAGNOSIS — N186 End stage renal disease: Secondary | ICD-10-CM | POA: Diagnosis not present

## 2021-02-26 DIAGNOSIS — Z992 Dependence on renal dialysis: Secondary | ICD-10-CM | POA: Diagnosis not present

## 2021-02-26 DIAGNOSIS — N2581 Secondary hyperparathyroidism of renal origin: Secondary | ICD-10-CM | POA: Diagnosis not present

## 2021-02-26 DIAGNOSIS — N186 End stage renal disease: Secondary | ICD-10-CM | POA: Diagnosis not present

## 2021-03-02 DIAGNOSIS — N2581 Secondary hyperparathyroidism of renal origin: Secondary | ICD-10-CM | POA: Diagnosis not present

## 2021-03-02 DIAGNOSIS — N186 End stage renal disease: Secondary | ICD-10-CM | POA: Diagnosis not present

## 2021-03-02 DIAGNOSIS — Z992 Dependence on renal dialysis: Secondary | ICD-10-CM | POA: Diagnosis not present

## 2021-03-05 DIAGNOSIS — N2581 Secondary hyperparathyroidism of renal origin: Secondary | ICD-10-CM | POA: Diagnosis not present

## 2021-03-05 DIAGNOSIS — N186 End stage renal disease: Secondary | ICD-10-CM | POA: Diagnosis not present

## 2021-03-05 DIAGNOSIS — Z992 Dependence on renal dialysis: Secondary | ICD-10-CM | POA: Diagnosis not present

## 2021-03-09 DIAGNOSIS — N2581 Secondary hyperparathyroidism of renal origin: Secondary | ICD-10-CM | POA: Diagnosis not present

## 2021-03-09 DIAGNOSIS — N186 End stage renal disease: Secondary | ICD-10-CM | POA: Diagnosis not present

## 2021-03-09 DIAGNOSIS — Z992 Dependence on renal dialysis: Secondary | ICD-10-CM | POA: Diagnosis not present

## 2021-03-12 DIAGNOSIS — N2581 Secondary hyperparathyroidism of renal origin: Secondary | ICD-10-CM | POA: Diagnosis not present

## 2021-03-12 DIAGNOSIS — N186 End stage renal disease: Secondary | ICD-10-CM | POA: Diagnosis not present

## 2021-03-12 DIAGNOSIS — Z992 Dependence on renal dialysis: Secondary | ICD-10-CM | POA: Diagnosis not present

## 2021-03-15 DIAGNOSIS — N186 End stage renal disease: Secondary | ICD-10-CM | POA: Diagnosis not present

## 2021-03-15 DIAGNOSIS — Z992 Dependence on renal dialysis: Secondary | ICD-10-CM | POA: Diagnosis not present

## 2021-03-16 DIAGNOSIS — N2581 Secondary hyperparathyroidism of renal origin: Secondary | ICD-10-CM | POA: Diagnosis not present

## 2021-03-16 DIAGNOSIS — Z992 Dependence on renal dialysis: Secondary | ICD-10-CM | POA: Diagnosis not present

## 2021-03-16 DIAGNOSIS — N186 End stage renal disease: Secondary | ICD-10-CM | POA: Diagnosis not present

## 2021-03-19 DIAGNOSIS — Z992 Dependence on renal dialysis: Secondary | ICD-10-CM | POA: Diagnosis not present

## 2021-03-19 DIAGNOSIS — N2581 Secondary hyperparathyroidism of renal origin: Secondary | ICD-10-CM | POA: Diagnosis not present

## 2021-03-19 DIAGNOSIS — N186 End stage renal disease: Secondary | ICD-10-CM | POA: Diagnosis not present

## 2021-03-23 DIAGNOSIS — N2581 Secondary hyperparathyroidism of renal origin: Secondary | ICD-10-CM | POA: Diagnosis not present

## 2021-03-23 DIAGNOSIS — Z992 Dependence on renal dialysis: Secondary | ICD-10-CM | POA: Diagnosis not present

## 2021-03-23 DIAGNOSIS — N186 End stage renal disease: Secondary | ICD-10-CM | POA: Diagnosis not present

## 2021-03-26 DIAGNOSIS — N186 End stage renal disease: Secondary | ICD-10-CM | POA: Diagnosis not present

## 2021-03-26 DIAGNOSIS — N2581 Secondary hyperparathyroidism of renal origin: Secondary | ICD-10-CM | POA: Diagnosis not present

## 2021-03-26 DIAGNOSIS — Z992 Dependence on renal dialysis: Secondary | ICD-10-CM | POA: Diagnosis not present

## 2021-03-30 DIAGNOSIS — N2581 Secondary hyperparathyroidism of renal origin: Secondary | ICD-10-CM | POA: Diagnosis not present

## 2021-03-30 DIAGNOSIS — N186 End stage renal disease: Secondary | ICD-10-CM | POA: Diagnosis not present

## 2021-03-30 DIAGNOSIS — Z992 Dependence on renal dialysis: Secondary | ICD-10-CM | POA: Diagnosis not present

## 2021-04-03 DIAGNOSIS — Z992 Dependence on renal dialysis: Secondary | ICD-10-CM | POA: Diagnosis not present

## 2021-04-03 DIAGNOSIS — N2581 Secondary hyperparathyroidism of renal origin: Secondary | ICD-10-CM | POA: Diagnosis not present

## 2021-04-03 DIAGNOSIS — N186 End stage renal disease: Secondary | ICD-10-CM | POA: Diagnosis not present

## 2021-04-06 DIAGNOSIS — Z992 Dependence on renal dialysis: Secondary | ICD-10-CM | POA: Diagnosis not present

## 2021-04-06 DIAGNOSIS — N186 End stage renal disease: Secondary | ICD-10-CM | POA: Diagnosis not present

## 2021-04-06 DIAGNOSIS — N2581 Secondary hyperparathyroidism of renal origin: Secondary | ICD-10-CM | POA: Diagnosis not present

## 2021-04-09 DIAGNOSIS — N186 End stage renal disease: Secondary | ICD-10-CM | POA: Diagnosis not present

## 2021-04-09 DIAGNOSIS — N2581 Secondary hyperparathyroidism of renal origin: Secondary | ICD-10-CM | POA: Diagnosis not present

## 2021-04-09 DIAGNOSIS — Z992 Dependence on renal dialysis: Secondary | ICD-10-CM | POA: Diagnosis not present

## 2021-04-13 DIAGNOSIS — Z992 Dependence on renal dialysis: Secondary | ICD-10-CM | POA: Diagnosis not present

## 2021-04-13 DIAGNOSIS — N2581 Secondary hyperparathyroidism of renal origin: Secondary | ICD-10-CM | POA: Diagnosis not present

## 2021-04-13 DIAGNOSIS — N186 End stage renal disease: Secondary | ICD-10-CM | POA: Diagnosis not present

## 2021-04-15 DIAGNOSIS — Z992 Dependence on renal dialysis: Secondary | ICD-10-CM | POA: Diagnosis not present

## 2021-04-15 DIAGNOSIS — N186 End stage renal disease: Secondary | ICD-10-CM | POA: Diagnosis not present

## 2021-04-16 DIAGNOSIS — N2581 Secondary hyperparathyroidism of renal origin: Secondary | ICD-10-CM | POA: Diagnosis not present

## 2021-04-16 DIAGNOSIS — Z992 Dependence on renal dialysis: Secondary | ICD-10-CM | POA: Diagnosis not present

## 2021-04-16 DIAGNOSIS — N186 End stage renal disease: Secondary | ICD-10-CM | POA: Diagnosis not present

## 2021-04-20 DIAGNOSIS — N186 End stage renal disease: Secondary | ICD-10-CM | POA: Diagnosis not present

## 2021-04-20 DIAGNOSIS — Z992 Dependence on renal dialysis: Secondary | ICD-10-CM | POA: Diagnosis not present

## 2021-04-20 DIAGNOSIS — N2581 Secondary hyperparathyroidism of renal origin: Secondary | ICD-10-CM | POA: Diagnosis not present

## 2021-04-23 DIAGNOSIS — N186 End stage renal disease: Secondary | ICD-10-CM | POA: Diagnosis not present

## 2021-04-23 DIAGNOSIS — N2581 Secondary hyperparathyroidism of renal origin: Secondary | ICD-10-CM | POA: Diagnosis not present

## 2021-04-23 DIAGNOSIS — Z992 Dependence on renal dialysis: Secondary | ICD-10-CM | POA: Diagnosis not present

## 2021-04-27 DIAGNOSIS — N186 End stage renal disease: Secondary | ICD-10-CM | POA: Diagnosis not present

## 2021-04-27 DIAGNOSIS — Z992 Dependence on renal dialysis: Secondary | ICD-10-CM | POA: Diagnosis not present

## 2021-04-27 DIAGNOSIS — N2581 Secondary hyperparathyroidism of renal origin: Secondary | ICD-10-CM | POA: Diagnosis not present

## 2021-04-30 DIAGNOSIS — N186 End stage renal disease: Secondary | ICD-10-CM | POA: Diagnosis not present

## 2021-04-30 DIAGNOSIS — N2581 Secondary hyperparathyroidism of renal origin: Secondary | ICD-10-CM | POA: Diagnosis not present

## 2021-04-30 DIAGNOSIS — Z992 Dependence on renal dialysis: Secondary | ICD-10-CM | POA: Diagnosis not present

## 2021-05-04 DIAGNOSIS — N2581 Secondary hyperparathyroidism of renal origin: Secondary | ICD-10-CM | POA: Diagnosis not present

## 2021-05-04 DIAGNOSIS — N186 End stage renal disease: Secondary | ICD-10-CM | POA: Diagnosis not present

## 2021-05-04 DIAGNOSIS — Z992 Dependence on renal dialysis: Secondary | ICD-10-CM | POA: Diagnosis not present

## 2021-05-07 DIAGNOSIS — N2581 Secondary hyperparathyroidism of renal origin: Secondary | ICD-10-CM | POA: Diagnosis not present

## 2021-05-07 DIAGNOSIS — N186 End stage renal disease: Secondary | ICD-10-CM | POA: Diagnosis not present

## 2021-05-07 DIAGNOSIS — Z992 Dependence on renal dialysis: Secondary | ICD-10-CM | POA: Diagnosis not present

## 2021-05-11 DIAGNOSIS — Z992 Dependence on renal dialysis: Secondary | ICD-10-CM | POA: Diagnosis not present

## 2021-05-11 DIAGNOSIS — N2581 Secondary hyperparathyroidism of renal origin: Secondary | ICD-10-CM | POA: Diagnosis not present

## 2021-05-11 DIAGNOSIS — N186 End stage renal disease: Secondary | ICD-10-CM | POA: Diagnosis not present

## 2021-05-14 DIAGNOSIS — N186 End stage renal disease: Secondary | ICD-10-CM | POA: Diagnosis not present

## 2021-05-14 DIAGNOSIS — N2581 Secondary hyperparathyroidism of renal origin: Secondary | ICD-10-CM | POA: Diagnosis not present

## 2021-05-14 DIAGNOSIS — Z992 Dependence on renal dialysis: Secondary | ICD-10-CM | POA: Diagnosis not present

## 2021-05-16 DIAGNOSIS — N186 End stage renal disease: Secondary | ICD-10-CM | POA: Diagnosis not present

## 2021-05-16 DIAGNOSIS — Z992 Dependence on renal dialysis: Secondary | ICD-10-CM | POA: Diagnosis not present

## 2021-05-18 DIAGNOSIS — N186 End stage renal disease: Secondary | ICD-10-CM | POA: Diagnosis not present

## 2021-05-18 DIAGNOSIS — Z992 Dependence on renal dialysis: Secondary | ICD-10-CM | POA: Diagnosis not present

## 2021-05-18 DIAGNOSIS — N2581 Secondary hyperparathyroidism of renal origin: Secondary | ICD-10-CM | POA: Diagnosis not present

## 2021-05-21 DIAGNOSIS — N2581 Secondary hyperparathyroidism of renal origin: Secondary | ICD-10-CM | POA: Diagnosis not present

## 2021-05-21 DIAGNOSIS — Z992 Dependence on renal dialysis: Secondary | ICD-10-CM | POA: Diagnosis not present

## 2021-05-21 DIAGNOSIS — N186 End stage renal disease: Secondary | ICD-10-CM | POA: Diagnosis not present

## 2021-05-26 DIAGNOSIS — N2581 Secondary hyperparathyroidism of renal origin: Secondary | ICD-10-CM | POA: Diagnosis not present

## 2021-05-26 DIAGNOSIS — Z992 Dependence on renal dialysis: Secondary | ICD-10-CM | POA: Diagnosis not present

## 2021-05-26 DIAGNOSIS — N186 End stage renal disease: Secondary | ICD-10-CM | POA: Diagnosis not present

## 2021-05-28 DIAGNOSIS — N186 End stage renal disease: Secondary | ICD-10-CM | POA: Diagnosis not present

## 2021-05-28 DIAGNOSIS — N2581 Secondary hyperparathyroidism of renal origin: Secondary | ICD-10-CM | POA: Diagnosis not present

## 2021-05-28 DIAGNOSIS — Z992 Dependence on renal dialysis: Secondary | ICD-10-CM | POA: Diagnosis not present

## 2021-05-31 DIAGNOSIS — N2581 Secondary hyperparathyroidism of renal origin: Secondary | ICD-10-CM | POA: Diagnosis not present

## 2021-05-31 DIAGNOSIS — N186 End stage renal disease: Secondary | ICD-10-CM | POA: Diagnosis not present

## 2021-05-31 DIAGNOSIS — Z992 Dependence on renal dialysis: Secondary | ICD-10-CM | POA: Diagnosis not present

## 2021-06-04 DIAGNOSIS — N186 End stage renal disease: Secondary | ICD-10-CM | POA: Diagnosis not present

## 2021-06-04 DIAGNOSIS — N2581 Secondary hyperparathyroidism of renal origin: Secondary | ICD-10-CM | POA: Diagnosis not present

## 2021-06-04 DIAGNOSIS — Z992 Dependence on renal dialysis: Secondary | ICD-10-CM | POA: Diagnosis not present

## 2021-06-08 DIAGNOSIS — Z992 Dependence on renal dialysis: Secondary | ICD-10-CM | POA: Diagnosis not present

## 2021-06-08 DIAGNOSIS — N186 End stage renal disease: Secondary | ICD-10-CM | POA: Diagnosis not present

## 2021-06-08 DIAGNOSIS — N2581 Secondary hyperparathyroidism of renal origin: Secondary | ICD-10-CM | POA: Diagnosis not present

## 2021-06-11 DIAGNOSIS — N2581 Secondary hyperparathyroidism of renal origin: Secondary | ICD-10-CM | POA: Diagnosis not present

## 2021-06-11 DIAGNOSIS — N186 End stage renal disease: Secondary | ICD-10-CM | POA: Diagnosis not present

## 2021-06-11 DIAGNOSIS — Z992 Dependence on renal dialysis: Secondary | ICD-10-CM | POA: Diagnosis not present

## 2021-06-15 DIAGNOSIS — Z992 Dependence on renal dialysis: Secondary | ICD-10-CM | POA: Diagnosis not present

## 2021-06-15 DIAGNOSIS — N186 End stage renal disease: Secondary | ICD-10-CM | POA: Diagnosis not present

## 2021-06-15 DIAGNOSIS — N2581 Secondary hyperparathyroidism of renal origin: Secondary | ICD-10-CM | POA: Diagnosis not present

## 2021-06-18 DIAGNOSIS — N186 End stage renal disease: Secondary | ICD-10-CM | POA: Diagnosis not present

## 2021-06-18 DIAGNOSIS — N2581 Secondary hyperparathyroidism of renal origin: Secondary | ICD-10-CM | POA: Diagnosis not present

## 2021-06-18 DIAGNOSIS — Z992 Dependence on renal dialysis: Secondary | ICD-10-CM | POA: Diagnosis not present

## 2021-06-22 DIAGNOSIS — N2581 Secondary hyperparathyroidism of renal origin: Secondary | ICD-10-CM | POA: Diagnosis not present

## 2021-06-22 DIAGNOSIS — N186 End stage renal disease: Secondary | ICD-10-CM | POA: Diagnosis not present

## 2021-06-22 DIAGNOSIS — Z992 Dependence on renal dialysis: Secondary | ICD-10-CM | POA: Diagnosis not present

## 2021-06-25 DIAGNOSIS — N186 End stage renal disease: Secondary | ICD-10-CM | POA: Diagnosis not present

## 2021-06-25 DIAGNOSIS — N2581 Secondary hyperparathyroidism of renal origin: Secondary | ICD-10-CM | POA: Diagnosis not present

## 2021-06-25 DIAGNOSIS — Z992 Dependence on renal dialysis: Secondary | ICD-10-CM | POA: Diagnosis not present

## 2021-06-29 DIAGNOSIS — N186 End stage renal disease: Secondary | ICD-10-CM | POA: Diagnosis not present

## 2021-06-29 DIAGNOSIS — Z992 Dependence on renal dialysis: Secondary | ICD-10-CM | POA: Diagnosis not present

## 2021-06-29 DIAGNOSIS — N2581 Secondary hyperparathyroidism of renal origin: Secondary | ICD-10-CM | POA: Diagnosis not present

## 2021-07-02 DIAGNOSIS — Z992 Dependence on renal dialysis: Secondary | ICD-10-CM | POA: Diagnosis not present

## 2021-07-02 DIAGNOSIS — N2581 Secondary hyperparathyroidism of renal origin: Secondary | ICD-10-CM | POA: Diagnosis not present

## 2021-07-02 DIAGNOSIS — N186 End stage renal disease: Secondary | ICD-10-CM | POA: Diagnosis not present

## 2021-07-06 DIAGNOSIS — Z992 Dependence on renal dialysis: Secondary | ICD-10-CM | POA: Diagnosis not present

## 2021-07-06 DIAGNOSIS — N2581 Secondary hyperparathyroidism of renal origin: Secondary | ICD-10-CM | POA: Diagnosis not present

## 2021-07-06 DIAGNOSIS — N186 End stage renal disease: Secondary | ICD-10-CM | POA: Diagnosis not present

## 2021-07-09 DIAGNOSIS — Z992 Dependence on renal dialysis: Secondary | ICD-10-CM | POA: Diagnosis not present

## 2021-07-09 DIAGNOSIS — N2581 Secondary hyperparathyroidism of renal origin: Secondary | ICD-10-CM | POA: Diagnosis not present

## 2021-07-09 DIAGNOSIS — N186 End stage renal disease: Secondary | ICD-10-CM | POA: Diagnosis not present

## 2021-07-13 DIAGNOSIS — N2581 Secondary hyperparathyroidism of renal origin: Secondary | ICD-10-CM | POA: Diagnosis not present

## 2021-07-13 DIAGNOSIS — N186 End stage renal disease: Secondary | ICD-10-CM | POA: Diagnosis not present

## 2021-07-13 DIAGNOSIS — Z992 Dependence on renal dialysis: Secondary | ICD-10-CM | POA: Diagnosis not present

## 2021-07-16 DIAGNOSIS — N186 End stage renal disease: Secondary | ICD-10-CM | POA: Diagnosis not present

## 2021-07-16 DIAGNOSIS — Z992 Dependence on renal dialysis: Secondary | ICD-10-CM | POA: Diagnosis not present

## 2021-07-16 DIAGNOSIS — N2581 Secondary hyperparathyroidism of renal origin: Secondary | ICD-10-CM | POA: Diagnosis not present

## 2021-07-20 DIAGNOSIS — Z992 Dependence on renal dialysis: Secondary | ICD-10-CM | POA: Diagnosis not present

## 2021-07-20 DIAGNOSIS — N186 End stage renal disease: Secondary | ICD-10-CM | POA: Diagnosis not present

## 2021-07-20 DIAGNOSIS — N2581 Secondary hyperparathyroidism of renal origin: Secondary | ICD-10-CM | POA: Diagnosis not present

## 2021-07-23 DIAGNOSIS — N2581 Secondary hyperparathyroidism of renal origin: Secondary | ICD-10-CM | POA: Diagnosis not present

## 2021-07-23 DIAGNOSIS — N186 End stage renal disease: Secondary | ICD-10-CM | POA: Diagnosis not present

## 2021-07-23 DIAGNOSIS — Z992 Dependence on renal dialysis: Secondary | ICD-10-CM | POA: Diagnosis not present

## 2021-07-27 DIAGNOSIS — Z992 Dependence on renal dialysis: Secondary | ICD-10-CM | POA: Diagnosis not present

## 2021-07-27 DIAGNOSIS — N2581 Secondary hyperparathyroidism of renal origin: Secondary | ICD-10-CM | POA: Diagnosis not present

## 2021-07-27 DIAGNOSIS — N186 End stage renal disease: Secondary | ICD-10-CM | POA: Diagnosis not present

## 2021-07-30 DIAGNOSIS — N186 End stage renal disease: Secondary | ICD-10-CM | POA: Diagnosis not present

## 2021-07-30 DIAGNOSIS — N2581 Secondary hyperparathyroidism of renal origin: Secondary | ICD-10-CM | POA: Diagnosis not present

## 2021-07-30 DIAGNOSIS — Z992 Dependence on renal dialysis: Secondary | ICD-10-CM | POA: Diagnosis not present

## 2021-08-03 DIAGNOSIS — Z992 Dependence on renal dialysis: Secondary | ICD-10-CM | POA: Diagnosis not present

## 2021-08-03 DIAGNOSIS — N2581 Secondary hyperparathyroidism of renal origin: Secondary | ICD-10-CM | POA: Diagnosis not present

## 2021-08-03 DIAGNOSIS — N186 End stage renal disease: Secondary | ICD-10-CM | POA: Diagnosis not present

## 2021-08-06 DIAGNOSIS — Z992 Dependence on renal dialysis: Secondary | ICD-10-CM | POA: Diagnosis not present

## 2021-08-06 DIAGNOSIS — N2581 Secondary hyperparathyroidism of renal origin: Secondary | ICD-10-CM | POA: Diagnosis not present

## 2021-08-06 DIAGNOSIS — N186 End stage renal disease: Secondary | ICD-10-CM | POA: Diagnosis not present

## 2021-08-10 DIAGNOSIS — N2581 Secondary hyperparathyroidism of renal origin: Secondary | ICD-10-CM | POA: Diagnosis not present

## 2021-08-10 DIAGNOSIS — N186 End stage renal disease: Secondary | ICD-10-CM | POA: Diagnosis not present

## 2021-08-10 DIAGNOSIS — Z992 Dependence on renal dialysis: Secondary | ICD-10-CM | POA: Diagnosis not present

## 2021-08-14 DIAGNOSIS — N2581 Secondary hyperparathyroidism of renal origin: Secondary | ICD-10-CM | POA: Diagnosis not present

## 2021-08-14 DIAGNOSIS — Z992 Dependence on renal dialysis: Secondary | ICD-10-CM | POA: Diagnosis not present

## 2021-08-14 DIAGNOSIS — N186 End stage renal disease: Secondary | ICD-10-CM | POA: Diagnosis not present

## 2021-08-15 DIAGNOSIS — N186 End stage renal disease: Secondary | ICD-10-CM | POA: Diagnosis not present

## 2021-08-15 DIAGNOSIS — Z992 Dependence on renal dialysis: Secondary | ICD-10-CM | POA: Diagnosis not present

## 2021-08-17 DIAGNOSIS — Z992 Dependence on renal dialysis: Secondary | ICD-10-CM | POA: Diagnosis not present

## 2021-08-17 DIAGNOSIS — N186 End stage renal disease: Secondary | ICD-10-CM | POA: Diagnosis not present

## 2021-08-17 DIAGNOSIS — N2581 Secondary hyperparathyroidism of renal origin: Secondary | ICD-10-CM | POA: Diagnosis not present

## 2021-08-20 DIAGNOSIS — N186 End stage renal disease: Secondary | ICD-10-CM | POA: Diagnosis not present

## 2021-08-20 DIAGNOSIS — N2581 Secondary hyperparathyroidism of renal origin: Secondary | ICD-10-CM | POA: Diagnosis not present

## 2021-08-20 DIAGNOSIS — Z992 Dependence on renal dialysis: Secondary | ICD-10-CM | POA: Diagnosis not present

## 2021-08-24 DIAGNOSIS — N2581 Secondary hyperparathyroidism of renal origin: Secondary | ICD-10-CM | POA: Diagnosis not present

## 2021-08-24 DIAGNOSIS — N186 End stage renal disease: Secondary | ICD-10-CM | POA: Diagnosis not present

## 2021-08-24 DIAGNOSIS — Z992 Dependence on renal dialysis: Secondary | ICD-10-CM | POA: Diagnosis not present

## 2021-08-27 DIAGNOSIS — N2581 Secondary hyperparathyroidism of renal origin: Secondary | ICD-10-CM | POA: Diagnosis not present

## 2021-08-27 DIAGNOSIS — N186 End stage renal disease: Secondary | ICD-10-CM | POA: Diagnosis not present

## 2021-08-27 DIAGNOSIS — Z992 Dependence on renal dialysis: Secondary | ICD-10-CM | POA: Diagnosis not present

## 2021-08-31 DIAGNOSIS — Z992 Dependence on renal dialysis: Secondary | ICD-10-CM | POA: Diagnosis not present

## 2021-08-31 DIAGNOSIS — N2581 Secondary hyperparathyroidism of renal origin: Secondary | ICD-10-CM | POA: Diagnosis not present

## 2021-08-31 DIAGNOSIS — N186 End stage renal disease: Secondary | ICD-10-CM | POA: Diagnosis not present

## 2021-09-03 DIAGNOSIS — N2581 Secondary hyperparathyroidism of renal origin: Secondary | ICD-10-CM | POA: Diagnosis not present

## 2021-09-03 DIAGNOSIS — N186 End stage renal disease: Secondary | ICD-10-CM | POA: Diagnosis not present

## 2021-09-03 DIAGNOSIS — Z992 Dependence on renal dialysis: Secondary | ICD-10-CM | POA: Diagnosis not present

## 2021-09-07 DIAGNOSIS — N186 End stage renal disease: Secondary | ICD-10-CM | POA: Diagnosis not present

## 2021-09-07 DIAGNOSIS — Z992 Dependence on renal dialysis: Secondary | ICD-10-CM | POA: Diagnosis not present

## 2021-09-07 DIAGNOSIS — N2581 Secondary hyperparathyroidism of renal origin: Secondary | ICD-10-CM | POA: Diagnosis not present

## 2021-09-10 DIAGNOSIS — N186 End stage renal disease: Secondary | ICD-10-CM | POA: Diagnosis not present

## 2021-09-10 DIAGNOSIS — Z992 Dependence on renal dialysis: Secondary | ICD-10-CM | POA: Diagnosis not present

## 2021-09-10 DIAGNOSIS — N2581 Secondary hyperparathyroidism of renal origin: Secondary | ICD-10-CM | POA: Diagnosis not present

## 2021-09-14 DIAGNOSIS — Z992 Dependence on renal dialysis: Secondary | ICD-10-CM | POA: Diagnosis not present

## 2021-09-14 DIAGNOSIS — N186 End stage renal disease: Secondary | ICD-10-CM | POA: Diagnosis not present

## 2021-09-14 DIAGNOSIS — N2581 Secondary hyperparathyroidism of renal origin: Secondary | ICD-10-CM | POA: Diagnosis not present

## 2021-09-15 DIAGNOSIS — N186 End stage renal disease: Secondary | ICD-10-CM | POA: Diagnosis not present

## 2021-09-15 DIAGNOSIS — Z992 Dependence on renal dialysis: Secondary | ICD-10-CM | POA: Diagnosis not present

## 2021-09-17 DIAGNOSIS — N186 End stage renal disease: Secondary | ICD-10-CM | POA: Diagnosis not present

## 2021-09-17 DIAGNOSIS — N2581 Secondary hyperparathyroidism of renal origin: Secondary | ICD-10-CM | POA: Diagnosis not present

## 2021-09-17 DIAGNOSIS — Z992 Dependence on renal dialysis: Secondary | ICD-10-CM | POA: Diagnosis not present

## 2021-09-21 DIAGNOSIS — N186 End stage renal disease: Secondary | ICD-10-CM | POA: Diagnosis not present

## 2021-09-21 DIAGNOSIS — Z992 Dependence on renal dialysis: Secondary | ICD-10-CM | POA: Diagnosis not present

## 2021-09-21 DIAGNOSIS — N2581 Secondary hyperparathyroidism of renal origin: Secondary | ICD-10-CM | POA: Diagnosis not present

## 2021-09-24 DIAGNOSIS — N186 End stage renal disease: Secondary | ICD-10-CM | POA: Diagnosis not present

## 2021-09-24 DIAGNOSIS — Z992 Dependence on renal dialysis: Secondary | ICD-10-CM | POA: Diagnosis not present

## 2021-09-24 DIAGNOSIS — N2581 Secondary hyperparathyroidism of renal origin: Secondary | ICD-10-CM | POA: Diagnosis not present

## 2021-09-28 DIAGNOSIS — Z992 Dependence on renal dialysis: Secondary | ICD-10-CM | POA: Diagnosis not present

## 2021-09-28 DIAGNOSIS — N2581 Secondary hyperparathyroidism of renal origin: Secondary | ICD-10-CM | POA: Diagnosis not present

## 2021-09-28 DIAGNOSIS — N186 End stage renal disease: Secondary | ICD-10-CM | POA: Diagnosis not present

## 2021-10-01 DIAGNOSIS — Z992 Dependence on renal dialysis: Secondary | ICD-10-CM | POA: Diagnosis not present

## 2021-10-01 DIAGNOSIS — N186 End stage renal disease: Secondary | ICD-10-CM | POA: Diagnosis not present

## 2021-10-01 DIAGNOSIS — N2581 Secondary hyperparathyroidism of renal origin: Secondary | ICD-10-CM | POA: Diagnosis not present

## 2021-10-05 DIAGNOSIS — Z992 Dependence on renal dialysis: Secondary | ICD-10-CM | POA: Diagnosis not present

## 2021-10-05 DIAGNOSIS — N2581 Secondary hyperparathyroidism of renal origin: Secondary | ICD-10-CM | POA: Diagnosis not present

## 2021-10-05 DIAGNOSIS — N186 End stage renal disease: Secondary | ICD-10-CM | POA: Diagnosis not present

## 2021-10-08 DIAGNOSIS — N2581 Secondary hyperparathyroidism of renal origin: Secondary | ICD-10-CM | POA: Diagnosis not present

## 2021-10-08 DIAGNOSIS — Z992 Dependence on renal dialysis: Secondary | ICD-10-CM | POA: Diagnosis not present

## 2021-10-08 DIAGNOSIS — N186 End stage renal disease: Secondary | ICD-10-CM | POA: Diagnosis not present

## 2021-10-12 DIAGNOSIS — N2581 Secondary hyperparathyroidism of renal origin: Secondary | ICD-10-CM | POA: Diagnosis not present

## 2021-10-12 DIAGNOSIS — N186 End stage renal disease: Secondary | ICD-10-CM | POA: Diagnosis not present

## 2021-10-12 DIAGNOSIS — Z992 Dependence on renal dialysis: Secondary | ICD-10-CM | POA: Diagnosis not present

## 2021-10-15 DIAGNOSIS — Z992 Dependence on renal dialysis: Secondary | ICD-10-CM | POA: Diagnosis not present

## 2021-10-15 DIAGNOSIS — N186 End stage renal disease: Secondary | ICD-10-CM | POA: Diagnosis not present

## 2021-10-15 DIAGNOSIS — N2581 Secondary hyperparathyroidism of renal origin: Secondary | ICD-10-CM | POA: Diagnosis not present

## 2021-10-16 DIAGNOSIS — Z992 Dependence on renal dialysis: Secondary | ICD-10-CM | POA: Diagnosis not present

## 2021-10-16 DIAGNOSIS — N186 End stage renal disease: Secondary | ICD-10-CM | POA: Diagnosis not present

## 2021-10-19 DIAGNOSIS — N2581 Secondary hyperparathyroidism of renal origin: Secondary | ICD-10-CM | POA: Diagnosis not present

## 2021-10-19 DIAGNOSIS — Z992 Dependence on renal dialysis: Secondary | ICD-10-CM | POA: Diagnosis not present

## 2021-10-19 DIAGNOSIS — N186 End stage renal disease: Secondary | ICD-10-CM | POA: Diagnosis not present

## 2021-10-22 DIAGNOSIS — N2581 Secondary hyperparathyroidism of renal origin: Secondary | ICD-10-CM | POA: Diagnosis not present

## 2021-10-22 DIAGNOSIS — Z992 Dependence on renal dialysis: Secondary | ICD-10-CM | POA: Diagnosis not present

## 2021-10-22 DIAGNOSIS — N186 End stage renal disease: Secondary | ICD-10-CM | POA: Diagnosis not present

## 2021-10-26 DIAGNOSIS — N186 End stage renal disease: Secondary | ICD-10-CM | POA: Diagnosis not present

## 2021-10-26 DIAGNOSIS — Z992 Dependence on renal dialysis: Secondary | ICD-10-CM | POA: Diagnosis not present

## 2021-10-26 DIAGNOSIS — N2581 Secondary hyperparathyroidism of renal origin: Secondary | ICD-10-CM | POA: Diagnosis not present

## 2021-10-29 DIAGNOSIS — N2581 Secondary hyperparathyroidism of renal origin: Secondary | ICD-10-CM | POA: Diagnosis not present

## 2021-10-29 DIAGNOSIS — Z992 Dependence on renal dialysis: Secondary | ICD-10-CM | POA: Diagnosis not present

## 2021-10-29 DIAGNOSIS — N186 End stage renal disease: Secondary | ICD-10-CM | POA: Diagnosis not present

## 2021-11-02 DIAGNOSIS — N186 End stage renal disease: Secondary | ICD-10-CM | POA: Diagnosis not present

## 2021-11-02 DIAGNOSIS — N2581 Secondary hyperparathyroidism of renal origin: Secondary | ICD-10-CM | POA: Diagnosis not present

## 2021-11-02 DIAGNOSIS — Z992 Dependence on renal dialysis: Secondary | ICD-10-CM | POA: Diagnosis not present

## 2021-11-05 DIAGNOSIS — N2581 Secondary hyperparathyroidism of renal origin: Secondary | ICD-10-CM | POA: Diagnosis not present

## 2021-11-05 DIAGNOSIS — N186 End stage renal disease: Secondary | ICD-10-CM | POA: Diagnosis not present

## 2021-11-05 DIAGNOSIS — Z992 Dependence on renal dialysis: Secondary | ICD-10-CM | POA: Diagnosis not present

## 2021-11-09 DIAGNOSIS — N2581 Secondary hyperparathyroidism of renal origin: Secondary | ICD-10-CM | POA: Diagnosis not present

## 2021-11-09 DIAGNOSIS — N186 End stage renal disease: Secondary | ICD-10-CM | POA: Diagnosis not present

## 2021-11-09 DIAGNOSIS — Z992 Dependence on renal dialysis: Secondary | ICD-10-CM | POA: Diagnosis not present

## 2021-11-12 DIAGNOSIS — N186 End stage renal disease: Secondary | ICD-10-CM | POA: Diagnosis not present

## 2021-11-12 DIAGNOSIS — Z992 Dependence on renal dialysis: Secondary | ICD-10-CM | POA: Diagnosis not present

## 2021-11-12 DIAGNOSIS — N2581 Secondary hyperparathyroidism of renal origin: Secondary | ICD-10-CM | POA: Diagnosis not present

## 2021-11-13 DIAGNOSIS — Z992 Dependence on renal dialysis: Secondary | ICD-10-CM | POA: Diagnosis not present

## 2021-11-13 DIAGNOSIS — N186 End stage renal disease: Secondary | ICD-10-CM | POA: Diagnosis not present

## 2021-11-16 DIAGNOSIS — N186 End stage renal disease: Secondary | ICD-10-CM | POA: Diagnosis not present

## 2021-11-16 DIAGNOSIS — Z992 Dependence on renal dialysis: Secondary | ICD-10-CM | POA: Diagnosis not present

## 2021-11-16 DIAGNOSIS — N2581 Secondary hyperparathyroidism of renal origin: Secondary | ICD-10-CM | POA: Diagnosis not present

## 2021-11-19 DIAGNOSIS — N186 End stage renal disease: Secondary | ICD-10-CM | POA: Diagnosis not present

## 2021-11-19 DIAGNOSIS — N2581 Secondary hyperparathyroidism of renal origin: Secondary | ICD-10-CM | POA: Diagnosis not present

## 2021-11-19 DIAGNOSIS — Z992 Dependence on renal dialysis: Secondary | ICD-10-CM | POA: Diagnosis not present

## 2021-11-23 DIAGNOSIS — N186 End stage renal disease: Secondary | ICD-10-CM | POA: Diagnosis not present

## 2021-11-23 DIAGNOSIS — N2581 Secondary hyperparathyroidism of renal origin: Secondary | ICD-10-CM | POA: Diagnosis not present

## 2021-11-23 DIAGNOSIS — Z992 Dependence on renal dialysis: Secondary | ICD-10-CM | POA: Diagnosis not present

## 2021-11-26 DIAGNOSIS — Z992 Dependence on renal dialysis: Secondary | ICD-10-CM | POA: Diagnosis not present

## 2021-11-26 DIAGNOSIS — N2581 Secondary hyperparathyroidism of renal origin: Secondary | ICD-10-CM | POA: Diagnosis not present

## 2021-11-26 DIAGNOSIS — N186 End stage renal disease: Secondary | ICD-10-CM | POA: Diagnosis not present

## 2021-11-30 DIAGNOSIS — Z992 Dependence on renal dialysis: Secondary | ICD-10-CM | POA: Diagnosis not present

## 2021-11-30 DIAGNOSIS — N2581 Secondary hyperparathyroidism of renal origin: Secondary | ICD-10-CM | POA: Diagnosis not present

## 2021-11-30 DIAGNOSIS — N186 End stage renal disease: Secondary | ICD-10-CM | POA: Diagnosis not present

## 2021-12-03 DIAGNOSIS — N186 End stage renal disease: Secondary | ICD-10-CM | POA: Diagnosis not present

## 2021-12-03 DIAGNOSIS — N2581 Secondary hyperparathyroidism of renal origin: Secondary | ICD-10-CM | POA: Diagnosis not present

## 2021-12-03 DIAGNOSIS — Z992 Dependence on renal dialysis: Secondary | ICD-10-CM | POA: Diagnosis not present

## 2021-12-07 DIAGNOSIS — Z992 Dependence on renal dialysis: Secondary | ICD-10-CM | POA: Diagnosis not present

## 2021-12-07 DIAGNOSIS — N2581 Secondary hyperparathyroidism of renal origin: Secondary | ICD-10-CM | POA: Diagnosis not present

## 2021-12-07 DIAGNOSIS — N186 End stage renal disease: Secondary | ICD-10-CM | POA: Diagnosis not present

## 2021-12-10 DIAGNOSIS — N186 End stage renal disease: Secondary | ICD-10-CM | POA: Diagnosis not present

## 2021-12-10 DIAGNOSIS — N2581 Secondary hyperparathyroidism of renal origin: Secondary | ICD-10-CM | POA: Diagnosis not present

## 2021-12-10 DIAGNOSIS — Z992 Dependence on renal dialysis: Secondary | ICD-10-CM | POA: Diagnosis not present

## 2021-12-14 DIAGNOSIS — N2581 Secondary hyperparathyroidism of renal origin: Secondary | ICD-10-CM | POA: Diagnosis not present

## 2021-12-14 DIAGNOSIS — N186 End stage renal disease: Secondary | ICD-10-CM | POA: Diagnosis not present

## 2021-12-14 DIAGNOSIS — Z992 Dependence on renal dialysis: Secondary | ICD-10-CM | POA: Diagnosis not present

## 2021-12-17 DIAGNOSIS — Z992 Dependence on renal dialysis: Secondary | ICD-10-CM | POA: Diagnosis not present

## 2021-12-17 DIAGNOSIS — N186 End stage renal disease: Secondary | ICD-10-CM | POA: Diagnosis not present

## 2021-12-17 DIAGNOSIS — N2581 Secondary hyperparathyroidism of renal origin: Secondary | ICD-10-CM | POA: Diagnosis not present

## 2021-12-21 DIAGNOSIS — N186 End stage renal disease: Secondary | ICD-10-CM | POA: Diagnosis not present

## 2021-12-21 DIAGNOSIS — N2581 Secondary hyperparathyroidism of renal origin: Secondary | ICD-10-CM | POA: Diagnosis not present

## 2021-12-21 DIAGNOSIS — Z992 Dependence on renal dialysis: Secondary | ICD-10-CM | POA: Diagnosis not present

## 2021-12-24 DIAGNOSIS — Z992 Dependence on renal dialysis: Secondary | ICD-10-CM | POA: Diagnosis not present

## 2021-12-24 DIAGNOSIS — N2581 Secondary hyperparathyroidism of renal origin: Secondary | ICD-10-CM | POA: Diagnosis not present

## 2021-12-24 DIAGNOSIS — N186 End stage renal disease: Secondary | ICD-10-CM | POA: Diagnosis not present

## 2021-12-28 DIAGNOSIS — Z992 Dependence on renal dialysis: Secondary | ICD-10-CM | POA: Diagnosis not present

## 2021-12-28 DIAGNOSIS — N2581 Secondary hyperparathyroidism of renal origin: Secondary | ICD-10-CM | POA: Diagnosis not present

## 2021-12-28 DIAGNOSIS — N186 End stage renal disease: Secondary | ICD-10-CM | POA: Diagnosis not present

## 2021-12-31 DIAGNOSIS — N186 End stage renal disease: Secondary | ICD-10-CM | POA: Diagnosis not present

## 2021-12-31 DIAGNOSIS — N2581 Secondary hyperparathyroidism of renal origin: Secondary | ICD-10-CM | POA: Diagnosis not present

## 2021-12-31 DIAGNOSIS — Z992 Dependence on renal dialysis: Secondary | ICD-10-CM | POA: Diagnosis not present

## 2022-01-04 DIAGNOSIS — N2581 Secondary hyperparathyroidism of renal origin: Secondary | ICD-10-CM | POA: Diagnosis not present

## 2022-01-04 DIAGNOSIS — N186 End stage renal disease: Secondary | ICD-10-CM | POA: Diagnosis not present

## 2022-01-04 DIAGNOSIS — Z992 Dependence on renal dialysis: Secondary | ICD-10-CM | POA: Diagnosis not present

## 2022-01-07 DIAGNOSIS — N2581 Secondary hyperparathyroidism of renal origin: Secondary | ICD-10-CM | POA: Diagnosis not present

## 2022-01-07 DIAGNOSIS — N186 End stage renal disease: Secondary | ICD-10-CM | POA: Diagnosis not present

## 2022-01-07 DIAGNOSIS — Z992 Dependence on renal dialysis: Secondary | ICD-10-CM | POA: Diagnosis not present

## 2022-01-11 DIAGNOSIS — N186 End stage renal disease: Secondary | ICD-10-CM | POA: Diagnosis not present

## 2022-01-11 DIAGNOSIS — Z992 Dependence on renal dialysis: Secondary | ICD-10-CM | POA: Diagnosis not present

## 2022-01-11 DIAGNOSIS — N2581 Secondary hyperparathyroidism of renal origin: Secondary | ICD-10-CM | POA: Diagnosis not present

## 2022-01-13 DIAGNOSIS — N186 End stage renal disease: Secondary | ICD-10-CM | POA: Diagnosis not present

## 2022-01-13 DIAGNOSIS — Z992 Dependence on renal dialysis: Secondary | ICD-10-CM | POA: Diagnosis not present

## 2022-01-14 DIAGNOSIS — N2581 Secondary hyperparathyroidism of renal origin: Secondary | ICD-10-CM | POA: Diagnosis not present

## 2022-01-14 DIAGNOSIS — N186 End stage renal disease: Secondary | ICD-10-CM | POA: Diagnosis not present

## 2022-01-14 DIAGNOSIS — Z992 Dependence on renal dialysis: Secondary | ICD-10-CM | POA: Diagnosis not present

## 2022-01-18 DIAGNOSIS — N2581 Secondary hyperparathyroidism of renal origin: Secondary | ICD-10-CM | POA: Diagnosis not present

## 2022-01-18 DIAGNOSIS — N186 End stage renal disease: Secondary | ICD-10-CM | POA: Diagnosis not present

## 2022-01-18 DIAGNOSIS — Z992 Dependence on renal dialysis: Secondary | ICD-10-CM | POA: Diagnosis not present

## 2022-01-21 DIAGNOSIS — N2581 Secondary hyperparathyroidism of renal origin: Secondary | ICD-10-CM | POA: Diagnosis not present

## 2022-01-21 DIAGNOSIS — N186 End stage renal disease: Secondary | ICD-10-CM | POA: Diagnosis not present

## 2022-01-21 DIAGNOSIS — Z992 Dependence on renal dialysis: Secondary | ICD-10-CM | POA: Diagnosis not present

## 2022-01-25 DIAGNOSIS — N2581 Secondary hyperparathyroidism of renal origin: Secondary | ICD-10-CM | POA: Diagnosis not present

## 2022-01-25 DIAGNOSIS — N186 End stage renal disease: Secondary | ICD-10-CM | POA: Diagnosis not present

## 2022-01-25 DIAGNOSIS — Z992 Dependence on renal dialysis: Secondary | ICD-10-CM | POA: Diagnosis not present

## 2022-01-28 DIAGNOSIS — N186 End stage renal disease: Secondary | ICD-10-CM | POA: Diagnosis not present

## 2022-01-28 DIAGNOSIS — Z992 Dependence on renal dialysis: Secondary | ICD-10-CM | POA: Diagnosis not present

## 2022-01-28 DIAGNOSIS — N2581 Secondary hyperparathyroidism of renal origin: Secondary | ICD-10-CM | POA: Diagnosis not present

## 2022-02-01 DIAGNOSIS — N186 End stage renal disease: Secondary | ICD-10-CM | POA: Diagnosis not present

## 2022-02-01 DIAGNOSIS — N2581 Secondary hyperparathyroidism of renal origin: Secondary | ICD-10-CM | POA: Diagnosis not present

## 2022-02-01 DIAGNOSIS — Z992 Dependence on renal dialysis: Secondary | ICD-10-CM | POA: Diagnosis not present

## 2022-02-04 DIAGNOSIS — N2581 Secondary hyperparathyroidism of renal origin: Secondary | ICD-10-CM | POA: Diagnosis not present

## 2022-02-04 DIAGNOSIS — Z992 Dependence on renal dialysis: Secondary | ICD-10-CM | POA: Diagnosis not present

## 2022-02-04 DIAGNOSIS — N186 End stage renal disease: Secondary | ICD-10-CM | POA: Diagnosis not present

## 2022-02-08 DIAGNOSIS — N186 End stage renal disease: Secondary | ICD-10-CM | POA: Diagnosis not present

## 2022-02-08 DIAGNOSIS — Z992 Dependence on renal dialysis: Secondary | ICD-10-CM | POA: Diagnosis not present

## 2022-02-08 DIAGNOSIS — N2581 Secondary hyperparathyroidism of renal origin: Secondary | ICD-10-CM | POA: Diagnosis not present

## 2022-02-11 DIAGNOSIS — Z992 Dependence on renal dialysis: Secondary | ICD-10-CM | POA: Diagnosis not present

## 2022-02-11 DIAGNOSIS — N186 End stage renal disease: Secondary | ICD-10-CM | POA: Diagnosis not present

## 2022-02-11 DIAGNOSIS — N2581 Secondary hyperparathyroidism of renal origin: Secondary | ICD-10-CM | POA: Diagnosis not present

## 2022-02-13 DIAGNOSIS — Z992 Dependence on renal dialysis: Secondary | ICD-10-CM | POA: Diagnosis not present

## 2022-02-13 DIAGNOSIS — N186 End stage renal disease: Secondary | ICD-10-CM | POA: Diagnosis not present

## 2022-02-15 DIAGNOSIS — N2581 Secondary hyperparathyroidism of renal origin: Secondary | ICD-10-CM | POA: Diagnosis not present

## 2022-02-15 DIAGNOSIS — Z992 Dependence on renal dialysis: Secondary | ICD-10-CM | POA: Diagnosis not present

## 2022-02-15 DIAGNOSIS — N186 End stage renal disease: Secondary | ICD-10-CM | POA: Diagnosis not present

## 2022-02-18 DIAGNOSIS — N2581 Secondary hyperparathyroidism of renal origin: Secondary | ICD-10-CM | POA: Diagnosis not present

## 2022-02-18 DIAGNOSIS — Z992 Dependence on renal dialysis: Secondary | ICD-10-CM | POA: Diagnosis not present

## 2022-02-18 DIAGNOSIS — N186 End stage renal disease: Secondary | ICD-10-CM | POA: Diagnosis not present

## 2022-02-22 DIAGNOSIS — N186 End stage renal disease: Secondary | ICD-10-CM | POA: Diagnosis not present

## 2022-02-22 DIAGNOSIS — Z992 Dependence on renal dialysis: Secondary | ICD-10-CM | POA: Diagnosis not present

## 2022-02-22 DIAGNOSIS — N2581 Secondary hyperparathyroidism of renal origin: Secondary | ICD-10-CM | POA: Diagnosis not present

## 2022-02-25 DIAGNOSIS — N2581 Secondary hyperparathyroidism of renal origin: Secondary | ICD-10-CM | POA: Diagnosis not present

## 2022-02-25 DIAGNOSIS — N186 End stage renal disease: Secondary | ICD-10-CM | POA: Diagnosis not present

## 2022-02-25 DIAGNOSIS — Z992 Dependence on renal dialysis: Secondary | ICD-10-CM | POA: Diagnosis not present

## 2022-03-01 DIAGNOSIS — N186 End stage renal disease: Secondary | ICD-10-CM | POA: Diagnosis not present

## 2022-03-01 DIAGNOSIS — Z992 Dependence on renal dialysis: Secondary | ICD-10-CM | POA: Diagnosis not present

## 2022-03-01 DIAGNOSIS — N2581 Secondary hyperparathyroidism of renal origin: Secondary | ICD-10-CM | POA: Diagnosis not present

## 2022-03-04 DIAGNOSIS — N2581 Secondary hyperparathyroidism of renal origin: Secondary | ICD-10-CM | POA: Diagnosis not present

## 2022-03-04 DIAGNOSIS — N186 End stage renal disease: Secondary | ICD-10-CM | POA: Diagnosis not present

## 2022-03-04 DIAGNOSIS — Z992 Dependence on renal dialysis: Secondary | ICD-10-CM | POA: Diagnosis not present

## 2022-03-08 DIAGNOSIS — Z992 Dependence on renal dialysis: Secondary | ICD-10-CM | POA: Diagnosis not present

## 2022-03-08 DIAGNOSIS — N186 End stage renal disease: Secondary | ICD-10-CM | POA: Diagnosis not present

## 2022-03-08 DIAGNOSIS — N2581 Secondary hyperparathyroidism of renal origin: Secondary | ICD-10-CM | POA: Diagnosis not present

## 2022-03-11 DIAGNOSIS — N2581 Secondary hyperparathyroidism of renal origin: Secondary | ICD-10-CM | POA: Diagnosis not present

## 2022-03-11 DIAGNOSIS — Z992 Dependence on renal dialysis: Secondary | ICD-10-CM | POA: Diagnosis not present

## 2022-03-11 DIAGNOSIS — N186 End stage renal disease: Secondary | ICD-10-CM | POA: Diagnosis not present

## 2022-03-15 DIAGNOSIS — N186 End stage renal disease: Secondary | ICD-10-CM | POA: Diagnosis not present

## 2022-03-15 DIAGNOSIS — Z992 Dependence on renal dialysis: Secondary | ICD-10-CM | POA: Diagnosis not present

## 2022-03-15 DIAGNOSIS — N2581 Secondary hyperparathyroidism of renal origin: Secondary | ICD-10-CM | POA: Diagnosis not present

## 2022-03-18 DIAGNOSIS — Z992 Dependence on renal dialysis: Secondary | ICD-10-CM | POA: Diagnosis not present

## 2022-03-18 DIAGNOSIS — N2581 Secondary hyperparathyroidism of renal origin: Secondary | ICD-10-CM | POA: Diagnosis not present

## 2022-03-18 DIAGNOSIS — N186 End stage renal disease: Secondary | ICD-10-CM | POA: Diagnosis not present

## 2022-03-22 DIAGNOSIS — N186 End stage renal disease: Secondary | ICD-10-CM | POA: Diagnosis not present

## 2022-03-22 DIAGNOSIS — Z992 Dependence on renal dialysis: Secondary | ICD-10-CM | POA: Diagnosis not present

## 2022-03-22 DIAGNOSIS — N2581 Secondary hyperparathyroidism of renal origin: Secondary | ICD-10-CM | POA: Diagnosis not present

## 2022-03-25 DIAGNOSIS — Z992 Dependence on renal dialysis: Secondary | ICD-10-CM | POA: Diagnosis not present

## 2022-03-25 DIAGNOSIS — N2581 Secondary hyperparathyroidism of renal origin: Secondary | ICD-10-CM | POA: Diagnosis not present

## 2022-03-25 DIAGNOSIS — N186 End stage renal disease: Secondary | ICD-10-CM | POA: Diagnosis not present

## 2022-03-29 DIAGNOSIS — N2581 Secondary hyperparathyroidism of renal origin: Secondary | ICD-10-CM | POA: Diagnosis not present

## 2022-03-29 DIAGNOSIS — N186 End stage renal disease: Secondary | ICD-10-CM | POA: Diagnosis not present

## 2022-03-29 DIAGNOSIS — Z992 Dependence on renal dialysis: Secondary | ICD-10-CM | POA: Diagnosis not present

## 2022-04-01 DIAGNOSIS — N2581 Secondary hyperparathyroidism of renal origin: Secondary | ICD-10-CM | POA: Diagnosis not present

## 2022-04-01 DIAGNOSIS — N186 End stage renal disease: Secondary | ICD-10-CM | POA: Diagnosis not present

## 2022-04-01 DIAGNOSIS — Z992 Dependence on renal dialysis: Secondary | ICD-10-CM | POA: Diagnosis not present

## 2022-04-05 DIAGNOSIS — N186 End stage renal disease: Secondary | ICD-10-CM | POA: Diagnosis not present

## 2022-04-05 DIAGNOSIS — Z992 Dependence on renal dialysis: Secondary | ICD-10-CM | POA: Diagnosis not present

## 2022-04-05 DIAGNOSIS — N2581 Secondary hyperparathyroidism of renal origin: Secondary | ICD-10-CM | POA: Diagnosis not present

## 2022-04-08 DIAGNOSIS — Z992 Dependence on renal dialysis: Secondary | ICD-10-CM | POA: Diagnosis not present

## 2022-04-08 DIAGNOSIS — N186 End stage renal disease: Secondary | ICD-10-CM | POA: Diagnosis not present

## 2022-04-08 DIAGNOSIS — N2581 Secondary hyperparathyroidism of renal origin: Secondary | ICD-10-CM | POA: Diagnosis not present

## 2022-04-12 DIAGNOSIS — Z992 Dependence on renal dialysis: Secondary | ICD-10-CM | POA: Diagnosis not present

## 2022-04-12 DIAGNOSIS — N186 End stage renal disease: Secondary | ICD-10-CM | POA: Diagnosis not present

## 2022-04-12 DIAGNOSIS — N2581 Secondary hyperparathyroidism of renal origin: Secondary | ICD-10-CM | POA: Diagnosis not present

## 2022-04-15 DIAGNOSIS — N2581 Secondary hyperparathyroidism of renal origin: Secondary | ICD-10-CM | POA: Diagnosis not present

## 2022-04-15 DIAGNOSIS — N186 End stage renal disease: Secondary | ICD-10-CM | POA: Diagnosis not present

## 2022-04-15 DIAGNOSIS — Z992 Dependence on renal dialysis: Secondary | ICD-10-CM | POA: Diagnosis not present

## 2022-04-19 DIAGNOSIS — N2581 Secondary hyperparathyroidism of renal origin: Secondary | ICD-10-CM | POA: Diagnosis not present

## 2022-04-19 DIAGNOSIS — N186 End stage renal disease: Secondary | ICD-10-CM | POA: Diagnosis not present

## 2022-04-19 DIAGNOSIS — Z992 Dependence on renal dialysis: Secondary | ICD-10-CM | POA: Diagnosis not present

## 2022-04-22 DIAGNOSIS — N186 End stage renal disease: Secondary | ICD-10-CM | POA: Diagnosis not present

## 2022-04-22 DIAGNOSIS — Z992 Dependence on renal dialysis: Secondary | ICD-10-CM | POA: Diagnosis not present

## 2022-04-22 DIAGNOSIS — N2581 Secondary hyperparathyroidism of renal origin: Secondary | ICD-10-CM | POA: Diagnosis not present

## 2022-04-26 DIAGNOSIS — N2581 Secondary hyperparathyroidism of renal origin: Secondary | ICD-10-CM | POA: Diagnosis not present

## 2022-04-26 DIAGNOSIS — N186 End stage renal disease: Secondary | ICD-10-CM | POA: Diagnosis not present

## 2022-04-26 DIAGNOSIS — Z992 Dependence on renal dialysis: Secondary | ICD-10-CM | POA: Diagnosis not present

## 2022-04-29 DIAGNOSIS — N2581 Secondary hyperparathyroidism of renal origin: Secondary | ICD-10-CM | POA: Diagnosis not present

## 2022-04-29 DIAGNOSIS — N186 End stage renal disease: Secondary | ICD-10-CM | POA: Diagnosis not present

## 2022-04-29 DIAGNOSIS — Z992 Dependence on renal dialysis: Secondary | ICD-10-CM | POA: Diagnosis not present

## 2022-05-03 DIAGNOSIS — N186 End stage renal disease: Secondary | ICD-10-CM | POA: Diagnosis not present

## 2022-05-03 DIAGNOSIS — Z992 Dependence on renal dialysis: Secondary | ICD-10-CM | POA: Diagnosis not present

## 2022-05-03 DIAGNOSIS — N2581 Secondary hyperparathyroidism of renal origin: Secondary | ICD-10-CM | POA: Diagnosis not present

## 2022-05-06 DIAGNOSIS — Z992 Dependence on renal dialysis: Secondary | ICD-10-CM | POA: Diagnosis not present

## 2022-05-06 DIAGNOSIS — N2581 Secondary hyperparathyroidism of renal origin: Secondary | ICD-10-CM | POA: Diagnosis not present

## 2022-05-06 DIAGNOSIS — N186 End stage renal disease: Secondary | ICD-10-CM | POA: Diagnosis not present

## 2022-05-10 DIAGNOSIS — N2581 Secondary hyperparathyroidism of renal origin: Secondary | ICD-10-CM | POA: Diagnosis not present

## 2022-05-10 DIAGNOSIS — N186 End stage renal disease: Secondary | ICD-10-CM | POA: Diagnosis not present

## 2022-05-10 DIAGNOSIS — Z992 Dependence on renal dialysis: Secondary | ICD-10-CM | POA: Diagnosis not present

## 2022-05-13 DIAGNOSIS — N2581 Secondary hyperparathyroidism of renal origin: Secondary | ICD-10-CM | POA: Diagnosis not present

## 2022-05-13 DIAGNOSIS — Z992 Dependence on renal dialysis: Secondary | ICD-10-CM | POA: Diagnosis not present

## 2022-05-13 DIAGNOSIS — N186 End stage renal disease: Secondary | ICD-10-CM | POA: Diagnosis not present

## 2022-05-16 DIAGNOSIS — N186 End stage renal disease: Secondary | ICD-10-CM | POA: Diagnosis not present

## 2022-05-16 DIAGNOSIS — Z992 Dependence on renal dialysis: Secondary | ICD-10-CM | POA: Diagnosis not present

## 2022-05-17 DIAGNOSIS — N186 End stage renal disease: Secondary | ICD-10-CM | POA: Diagnosis not present

## 2022-05-17 DIAGNOSIS — N2581 Secondary hyperparathyroidism of renal origin: Secondary | ICD-10-CM | POA: Diagnosis not present

## 2022-05-17 DIAGNOSIS — Z992 Dependence on renal dialysis: Secondary | ICD-10-CM | POA: Diagnosis not present

## 2022-05-20 DIAGNOSIS — N186 End stage renal disease: Secondary | ICD-10-CM | POA: Diagnosis not present

## 2022-05-20 DIAGNOSIS — N2581 Secondary hyperparathyroidism of renal origin: Secondary | ICD-10-CM | POA: Diagnosis not present

## 2022-05-20 DIAGNOSIS — Z992 Dependence on renal dialysis: Secondary | ICD-10-CM | POA: Diagnosis not present

## 2022-05-24 DIAGNOSIS — N186 End stage renal disease: Secondary | ICD-10-CM | POA: Diagnosis not present

## 2022-05-24 DIAGNOSIS — N2581 Secondary hyperparathyroidism of renal origin: Secondary | ICD-10-CM | POA: Diagnosis not present

## 2022-05-24 DIAGNOSIS — Z992 Dependence on renal dialysis: Secondary | ICD-10-CM | POA: Diagnosis not present

## 2022-05-27 DIAGNOSIS — N186 End stage renal disease: Secondary | ICD-10-CM | POA: Diagnosis not present

## 2022-05-27 DIAGNOSIS — Z992 Dependence on renal dialysis: Secondary | ICD-10-CM | POA: Diagnosis not present

## 2022-05-27 DIAGNOSIS — N2581 Secondary hyperparathyroidism of renal origin: Secondary | ICD-10-CM | POA: Diagnosis not present

## 2022-05-31 DIAGNOSIS — N2581 Secondary hyperparathyroidism of renal origin: Secondary | ICD-10-CM | POA: Diagnosis not present

## 2022-05-31 DIAGNOSIS — N186 End stage renal disease: Secondary | ICD-10-CM | POA: Diagnosis not present

## 2022-05-31 DIAGNOSIS — Z992 Dependence on renal dialysis: Secondary | ICD-10-CM | POA: Diagnosis not present

## 2022-06-03 DIAGNOSIS — N186 End stage renal disease: Secondary | ICD-10-CM | POA: Diagnosis not present

## 2022-06-03 DIAGNOSIS — N2581 Secondary hyperparathyroidism of renal origin: Secondary | ICD-10-CM | POA: Diagnosis not present

## 2022-06-03 DIAGNOSIS — Z992 Dependence on renal dialysis: Secondary | ICD-10-CM | POA: Diagnosis not present

## 2022-06-08 DIAGNOSIS — N186 End stage renal disease: Secondary | ICD-10-CM | POA: Diagnosis not present

## 2022-06-08 DIAGNOSIS — Z992 Dependence on renal dialysis: Secondary | ICD-10-CM | POA: Diagnosis not present

## 2022-06-08 DIAGNOSIS — N2581 Secondary hyperparathyroidism of renal origin: Secondary | ICD-10-CM | POA: Diagnosis not present

## 2022-06-10 DIAGNOSIS — N186 End stage renal disease: Secondary | ICD-10-CM | POA: Diagnosis not present

## 2022-06-10 DIAGNOSIS — Z79899 Other long term (current) drug therapy: Secondary | ICD-10-CM | POA: Diagnosis not present

## 2022-06-10 DIAGNOSIS — N2581 Secondary hyperparathyroidism of renal origin: Secondary | ICD-10-CM | POA: Diagnosis not present

## 2022-06-10 DIAGNOSIS — Z992 Dependence on renal dialysis: Secondary | ICD-10-CM | POA: Diagnosis not present

## 2022-06-14 DIAGNOSIS — N186 End stage renal disease: Secondary | ICD-10-CM | POA: Diagnosis not present

## 2022-06-14 DIAGNOSIS — Z992 Dependence on renal dialysis: Secondary | ICD-10-CM | POA: Diagnosis not present

## 2022-06-14 DIAGNOSIS — N2581 Secondary hyperparathyroidism of renal origin: Secondary | ICD-10-CM | POA: Diagnosis not present

## 2022-06-15 DIAGNOSIS — N186 End stage renal disease: Secondary | ICD-10-CM | POA: Diagnosis not present

## 2022-06-15 DIAGNOSIS — Z992 Dependence on renal dialysis: Secondary | ICD-10-CM | POA: Diagnosis not present

## 2022-06-17 DIAGNOSIS — N186 End stage renal disease: Secondary | ICD-10-CM | POA: Diagnosis not present

## 2022-06-17 DIAGNOSIS — N2581 Secondary hyperparathyroidism of renal origin: Secondary | ICD-10-CM | POA: Diagnosis not present

## 2022-06-17 DIAGNOSIS — Z992 Dependence on renal dialysis: Secondary | ICD-10-CM | POA: Diagnosis not present

## 2022-06-21 DIAGNOSIS — Z992 Dependence on renal dialysis: Secondary | ICD-10-CM | POA: Diagnosis not present

## 2022-06-21 DIAGNOSIS — N186 End stage renal disease: Secondary | ICD-10-CM | POA: Diagnosis not present

## 2022-06-21 DIAGNOSIS — N2581 Secondary hyperparathyroidism of renal origin: Secondary | ICD-10-CM | POA: Diagnosis not present

## 2022-06-23 ENCOUNTER — Inpatient Hospital Stay
Admission: EM | Admit: 2022-06-23 | Discharge: 2022-07-17 | DRG: 252 | Disposition: E | Payer: Medicare HMO | Attending: Internal Medicine | Admitting: Internal Medicine

## 2022-06-23 ENCOUNTER — Other Ambulatory Visit: Payer: Self-pay

## 2022-06-23 ENCOUNTER — Emergency Department: Payer: Medicare HMO

## 2022-06-23 ENCOUNTER — Encounter: Payer: Self-pay | Admitting: Emergency Medicine

## 2022-06-23 DIAGNOSIS — K5730 Diverticulosis of large intestine without perforation or abscess without bleeding: Secondary | ICD-10-CM | POA: Diagnosis not present

## 2022-06-23 DIAGNOSIS — J810 Acute pulmonary edema: Secondary | ICD-10-CM

## 2022-06-23 DIAGNOSIS — Z841 Family history of disorders of kidney and ureter: Secondary | ICD-10-CM

## 2022-06-23 DIAGNOSIS — J9602 Acute respiratory failure with hypercapnia: Secondary | ICD-10-CM | POA: Diagnosis present

## 2022-06-23 DIAGNOSIS — I672 Cerebral atherosclerosis: Secondary | ICD-10-CM | POA: Diagnosis not present

## 2022-06-23 DIAGNOSIS — Z515 Encounter for palliative care: Secondary | ICD-10-CM | POA: Diagnosis not present

## 2022-06-23 DIAGNOSIS — R000 Tachycardia, unspecified: Secondary | ICD-10-CM | POA: Diagnosis not present

## 2022-06-23 DIAGNOSIS — Z7189 Other specified counseling: Secondary | ICD-10-CM | POA: Diagnosis not present

## 2022-06-23 DIAGNOSIS — I724 Aneurysm of artery of lower extremity: Secondary | ICD-10-CM | POA: Diagnosis present

## 2022-06-23 DIAGNOSIS — K920 Hematemesis: Secondary | ICD-10-CM | POA: Diagnosis not present

## 2022-06-23 DIAGNOSIS — J9690 Respiratory failure, unspecified, unspecified whether with hypoxia or hypercapnia: Secondary | ICD-10-CM | POA: Diagnosis not present

## 2022-06-23 DIAGNOSIS — Q283 Other malformations of cerebral vessels: Secondary | ICD-10-CM | POA: Diagnosis not present

## 2022-06-23 DIAGNOSIS — I70201 Unspecified atherosclerosis of native arteries of extremities, right leg: Secondary | ICD-10-CM | POA: Diagnosis not present

## 2022-06-23 DIAGNOSIS — Z992 Dependence on renal dialysis: Secondary | ICD-10-CM

## 2022-06-23 DIAGNOSIS — R918 Other nonspecific abnormal finding of lung field: Secondary | ICD-10-CM | POA: Diagnosis not present

## 2022-06-23 DIAGNOSIS — I213 ST elevation (STEMI) myocardial infarction of unspecified site: Secondary | ICD-10-CM | POA: Diagnosis not present

## 2022-06-23 DIAGNOSIS — R54 Age-related physical debility: Secondary | ICD-10-CM | POA: Diagnosis present

## 2022-06-23 DIAGNOSIS — T81718A Complication of other artery following a procedure, not elsewhere classified, initial encounter: Secondary | ICD-10-CM | POA: Diagnosis not present

## 2022-06-23 DIAGNOSIS — R55 Syncope and collapse: Secondary | ICD-10-CM | POA: Diagnosis not present

## 2022-06-23 DIAGNOSIS — D72829 Elevated white blood cell count, unspecified: Secondary | ICD-10-CM | POA: Diagnosis not present

## 2022-06-23 DIAGNOSIS — J9601 Acute respiratory failure with hypoxia: Secondary | ICD-10-CM | POA: Diagnosis present

## 2022-06-23 DIAGNOSIS — E875 Hyperkalemia: Secondary | ICD-10-CM | POA: Diagnosis not present

## 2022-06-23 DIAGNOSIS — D631 Anemia in chronic kidney disease: Secondary | ICD-10-CM | POA: Diagnosis present

## 2022-06-23 DIAGNOSIS — I5031 Acute diastolic (congestive) heart failure: Secondary | ICD-10-CM | POA: Diagnosis present

## 2022-06-23 DIAGNOSIS — R4182 Altered mental status, unspecified: Secondary | ICD-10-CM | POA: Diagnosis not present

## 2022-06-23 DIAGNOSIS — M47812 Spondylosis without myelopathy or radiculopathy, cervical region: Secondary | ICD-10-CM | POA: Diagnosis not present

## 2022-06-23 DIAGNOSIS — R079 Chest pain, unspecified: Secondary | ICD-10-CM | POA: Diagnosis not present

## 2022-06-23 DIAGNOSIS — I472 Ventricular tachycardia, unspecified: Secondary | ICD-10-CM | POA: Diagnosis not present

## 2022-06-23 DIAGNOSIS — N179 Acute kidney failure, unspecified: Secondary | ICD-10-CM | POA: Diagnosis present

## 2022-06-23 DIAGNOSIS — I214 Non-ST elevation (NSTEMI) myocardial infarction: Secondary | ICD-10-CM | POA: Diagnosis not present

## 2022-06-23 DIAGNOSIS — I959 Hypotension, unspecified: Secondary | ICD-10-CM | POA: Diagnosis not present

## 2022-06-23 DIAGNOSIS — Z20822 Contact with and (suspected) exposure to covid-19: Secondary | ICD-10-CM | POA: Diagnosis present

## 2022-06-23 DIAGNOSIS — E669 Obesity, unspecified: Secondary | ICD-10-CM | POA: Diagnosis present

## 2022-06-23 DIAGNOSIS — J323 Chronic sphenoidal sinusitis: Secondary | ICD-10-CM | POA: Diagnosis not present

## 2022-06-23 DIAGNOSIS — R008 Other abnormalities of heart beat: Secondary | ICD-10-CM | POA: Diagnosis not present

## 2022-06-23 DIAGNOSIS — N186 End stage renal disease: Secondary | ICD-10-CM | POA: Diagnosis present

## 2022-06-23 DIAGNOSIS — J9621 Acute and chronic respiratory failure with hypoxia: Secondary | ICD-10-CM | POA: Diagnosis not present

## 2022-06-23 DIAGNOSIS — I132 Hypertensive heart and chronic kidney disease with heart failure and with stage 5 chronic kidney disease, or end stage renal disease: Secondary | ICD-10-CM | POA: Diagnosis present

## 2022-06-23 DIAGNOSIS — I4891 Unspecified atrial fibrillation: Secondary | ICD-10-CM | POA: Diagnosis not present

## 2022-06-23 DIAGNOSIS — J811 Chronic pulmonary edema: Secondary | ICD-10-CM | POA: Diagnosis present

## 2022-06-23 DIAGNOSIS — Z91199 Patient's noncompliance with other medical treatment and regimen due to unspecified reason: Secondary | ICD-10-CM

## 2022-06-23 DIAGNOSIS — I728 Aneurysm of other specified arteries: Secondary | ICD-10-CM | POA: Diagnosis present

## 2022-06-23 DIAGNOSIS — Z9889 Other specified postprocedural states: Secondary | ICD-10-CM | POA: Diagnosis not present

## 2022-06-23 DIAGNOSIS — M1990 Unspecified osteoarthritis, unspecified site: Secondary | ICD-10-CM | POA: Diagnosis present

## 2022-06-23 DIAGNOSIS — I471 Supraventricular tachycardia, unspecified: Secondary | ICD-10-CM | POA: Diagnosis present

## 2022-06-23 DIAGNOSIS — I729 Aneurysm of unspecified site: Secondary | ICD-10-CM | POA: Diagnosis not present

## 2022-06-23 DIAGNOSIS — R578 Other shock: Secondary | ICD-10-CM | POA: Diagnosis not present

## 2022-06-23 DIAGNOSIS — E8770 Fluid overload, unspecified: Secondary | ICD-10-CM | POA: Diagnosis present

## 2022-06-23 DIAGNOSIS — K551 Chronic vascular disorders of intestine: Secondary | ICD-10-CM | POA: Diagnosis not present

## 2022-06-23 DIAGNOSIS — E7800 Pure hypercholesterolemia, unspecified: Secondary | ICD-10-CM | POA: Diagnosis present

## 2022-06-23 DIAGNOSIS — J9811 Atelectasis: Secondary | ICD-10-CM | POA: Diagnosis not present

## 2022-06-23 DIAGNOSIS — R0602 Shortness of breath: Principal | ICD-10-CM

## 2022-06-23 DIAGNOSIS — I499 Cardiac arrhythmia, unspecified: Secondary | ICD-10-CM | POA: Diagnosis not present

## 2022-06-23 DIAGNOSIS — J90 Pleural effusion, not elsewhere classified: Secondary | ICD-10-CM | POA: Diagnosis not present

## 2022-06-23 DIAGNOSIS — I25110 Atherosclerotic heart disease of native coronary artery with unstable angina pectoris: Secondary | ICD-10-CM | POA: Diagnosis present

## 2022-06-23 DIAGNOSIS — I493 Ventricular premature depolarization: Secondary | ICD-10-CM | POA: Diagnosis not present

## 2022-06-23 DIAGNOSIS — G9341 Metabolic encephalopathy: Secondary | ICD-10-CM | POA: Diagnosis present

## 2022-06-23 DIAGNOSIS — I6503 Occlusion and stenosis of bilateral vertebral arteries: Secondary | ICD-10-CM | POA: Diagnosis not present

## 2022-06-23 DIAGNOSIS — Z6836 Body mass index (BMI) 36.0-36.9, adult: Secondary | ICD-10-CM

## 2022-06-23 DIAGNOSIS — Z66 Do not resuscitate: Secondary | ICD-10-CM | POA: Diagnosis not present

## 2022-06-23 DIAGNOSIS — K8020 Calculus of gallbladder without cholecystitis without obstruction: Secondary | ICD-10-CM | POA: Diagnosis not present

## 2022-06-23 DIAGNOSIS — N281 Cyst of kidney, acquired: Secondary | ICD-10-CM | POA: Diagnosis not present

## 2022-06-23 DIAGNOSIS — M79602 Pain in left arm: Secondary | ICD-10-CM | POA: Diagnosis not present

## 2022-06-23 DIAGNOSIS — N185 Chronic kidney disease, stage 5: Secondary | ICD-10-CM | POA: Diagnosis not present

## 2022-06-23 DIAGNOSIS — Z8249 Family history of ischemic heart disease and other diseases of the circulatory system: Secondary | ICD-10-CM

## 2022-06-23 DIAGNOSIS — R609 Edema, unspecified: Secondary | ICD-10-CM | POA: Diagnosis not present

## 2022-06-23 DIAGNOSIS — Z79899 Other long term (current) drug therapy: Secondary | ICD-10-CM

## 2022-06-23 DIAGNOSIS — I10 Essential (primary) hypertension: Secondary | ICD-10-CM | POA: Diagnosis present

## 2022-06-23 DIAGNOSIS — E872 Acidosis, unspecified: Secondary | ICD-10-CM | POA: Diagnosis present

## 2022-06-23 DIAGNOSIS — M7989 Other specified soft tissue disorders: Secondary | ICD-10-CM | POA: Diagnosis not present

## 2022-06-23 DIAGNOSIS — R0902 Hypoxemia: Secondary | ICD-10-CM | POA: Diagnosis not present

## 2022-06-23 DIAGNOSIS — R197 Diarrhea, unspecified: Secondary | ICD-10-CM | POA: Diagnosis present

## 2022-06-23 DIAGNOSIS — I743 Embolism and thrombosis of arteries of the lower extremities: Secondary | ICD-10-CM | POA: Diagnosis not present

## 2022-06-23 DIAGNOSIS — I509 Heart failure, unspecified: Secondary | ICD-10-CM | POA: Diagnosis not present

## 2022-06-23 DIAGNOSIS — Z7902 Long term (current) use of antithrombotics/antiplatelets: Secondary | ICD-10-CM

## 2022-06-23 DIAGNOSIS — E162 Hypoglycemia, unspecified: Secondary | ICD-10-CM | POA: Diagnosis not present

## 2022-06-23 DIAGNOSIS — N2581 Secondary hyperparathyroidism of renal origin: Secondary | ICD-10-CM | POA: Diagnosis not present

## 2022-06-23 DIAGNOSIS — R7989 Other specified abnormal findings of blood chemistry: Secondary | ICD-10-CM | POA: Diagnosis not present

## 2022-06-23 DIAGNOSIS — I6782 Cerebral ischemia: Secondary | ICD-10-CM | POA: Diagnosis not present

## 2022-06-23 DIAGNOSIS — I701 Atherosclerosis of renal artery: Secondary | ICD-10-CM | POA: Diagnosis not present

## 2022-06-23 DIAGNOSIS — D62 Acute posthemorrhagic anemia: Secondary | ICD-10-CM | POA: Diagnosis not present

## 2022-06-23 DIAGNOSIS — D696 Thrombocytopenia, unspecified: Secondary | ICD-10-CM | POA: Diagnosis not present

## 2022-06-23 DIAGNOSIS — Z87891 Personal history of nicotine dependence: Secondary | ICD-10-CM

## 2022-06-23 DIAGNOSIS — I6381 Other cerebral infarction due to occlusion or stenosis of small artery: Secondary | ICD-10-CM | POA: Diagnosis not present

## 2022-06-23 DIAGNOSIS — Z90710 Acquired absence of both cervix and uterus: Secondary | ICD-10-CM

## 2022-06-23 DIAGNOSIS — R0689 Other abnormalities of breathing: Secondary | ICD-10-CM | POA: Diagnosis not present

## 2022-06-23 LAB — CBC WITH DIFFERENTIAL/PLATELET
Abs Immature Granulocytes: 0.02 10*3/uL (ref 0.00–0.07)
Basophils Absolute: 0 10*3/uL (ref 0.0–0.1)
Basophils Relative: 0 %
Eosinophils Absolute: 0 10*3/uL (ref 0.0–0.5)
Eosinophils Relative: 0 %
HCT: 38.5 % (ref 36.0–46.0)
Hemoglobin: 11.8 g/dL — ABNORMAL LOW (ref 12.0–15.0)
Immature Granulocytes: 0 %
Lymphocytes Relative: 9 %
Lymphs Abs: 0.5 10*3/uL — ABNORMAL LOW (ref 0.7–4.0)
MCH: 25.3 pg — ABNORMAL LOW (ref 26.0–34.0)
MCHC: 30.6 g/dL (ref 30.0–36.0)
MCV: 82.4 fL (ref 80.0–100.0)
Monocytes Absolute: 0.1 10*3/uL (ref 0.1–1.0)
Monocytes Relative: 1 %
Neutro Abs: 5.2 10*3/uL (ref 1.7–7.7)
Neutrophils Relative %: 90 %
Platelets: 269 10*3/uL (ref 150–400)
RBC: 4.67 MIL/uL (ref 3.87–5.11)
RDW: 15.9 % — ABNORMAL HIGH (ref 11.5–15.5)
WBC: 5.9 10*3/uL (ref 4.0–10.5)
nRBC: 0 % (ref 0.0–0.2)

## 2022-06-23 LAB — RESP PANEL BY RT-PCR (FLU A&B, COVID) ARPGX2
Influenza A by PCR: NEGATIVE
Influenza B by PCR: NEGATIVE
SARS Coronavirus 2 by RT PCR: NEGATIVE

## 2022-06-23 LAB — BASIC METABOLIC PANEL
Anion gap: 18 — ABNORMAL HIGH (ref 5–15)
BUN: 63 mg/dL — ABNORMAL HIGH (ref 8–23)
CO2: 20 mmol/L — ABNORMAL LOW (ref 22–32)
Calcium: 8.3 mg/dL — ABNORMAL LOW (ref 8.9–10.3)
Chloride: 97 mmol/L — ABNORMAL LOW (ref 98–111)
Creatinine, Ser: 9.33 mg/dL — ABNORMAL HIGH (ref 0.44–1.00)
GFR, Estimated: 4 mL/min — ABNORMAL LOW (ref >60)
Glucose, Bld: 172 mg/dL — ABNORMAL HIGH (ref 70–99)
Potassium: 5.9 mmol/L — ABNORMAL HIGH (ref 3.5–5.1)
Sodium: 135 mmol/L (ref 135–145)

## 2022-06-23 LAB — TROPONIN I (HIGH SENSITIVITY)
Troponin I (High Sensitivity): 89 ng/L — ABNORMAL HIGH (ref <18)
Troponin I (High Sensitivity): 10834 ng/L (ref <18)

## 2022-06-23 LAB — LACTIC ACID, PLASMA
Lactic Acid, Venous: 4.1 mmol/L (ref 0.5–1.9)
Lactic Acid, Venous: 2.6 mmol/L (ref 0.5–1.9)

## 2022-06-23 MED ORDER — DILTIAZEM HCL 25 MG/5ML IV SOLN
10.0000 mg | Freq: Once | INTRAVENOUS | Status: AC
  Administered 2022-06-23: 10 mg via INTRAVENOUS
  Filled 2022-06-23: qty 5

## 2022-06-23 MED ORDER — ONDANSETRON HCL 4 MG/2ML IJ SOLN
4.0000 mg | Freq: Four times a day (QID) | INTRAMUSCULAR | Status: AC | PRN
  Administered 2022-06-24: 4 mg via INTRAVENOUS
  Filled 2022-06-23: qty 2

## 2022-06-23 MED ORDER — CHLORHEXIDINE GLUCONATE CLOTH 2 % EX PADS
6.0000 | MEDICATED_PAD | Freq: Every day | CUTANEOUS | Status: DC
  Administered 2022-06-25 – 2022-07-07 (×12): 6 via TOPICAL
  Filled 2022-06-23: qty 6

## 2022-06-23 MED ORDER — LIDOCAINE-PRILOCAINE 2.5-2.5 % EX CREA: 1.0000 | TOPICAL_CREAM | Freq: Every day | CUTANEOUS | Status: DC | PRN

## 2022-06-23 MED ORDER — DEXTROSE 50 % IV SOLN
1.0000 | Freq: Once | INTRAVENOUS | Status: AC
  Administered 2022-06-23: 50 mL via INTRAVENOUS
  Filled 2022-06-23: qty 50

## 2022-06-23 MED ORDER — INSULIN ASPART 100 UNIT/ML IJ SOLN
10.0000 [IU] | Freq: Once | INTRAMUSCULAR | Status: AC
  Administered 2022-06-23: 10 [IU] via INTRAVENOUS
  Filled 2022-06-23: qty 1

## 2022-06-23 MED ORDER — ACETAMINOPHEN 650 MG RE SUPP: 650.0000 mg | Freq: Four times a day (QID) | RECTAL | Status: AC | PRN

## 2022-06-23 MED ORDER — ACETAMINOPHEN 325 MG PO TABS
650.0000 mg | ORAL_TABLET | Freq: Four times a day (QID) | ORAL | Status: AC | PRN
  Administered 2022-06-26 – 2022-06-28 (×4): 650 mg via ORAL
  Filled 2022-06-23 (×3): qty 2

## 2022-06-23 MED ORDER — SENNOSIDES-DOCUSATE SODIUM 8.6-50 MG PO TABS: 1.0000 | ORAL_TABLET | Freq: Every evening | ORAL | Status: DC | PRN

## 2022-06-23 MED ORDER — ONDANSETRON HCL 4 MG PO TABS: 4.0000 mg | ORAL_TABLET | Freq: Four times a day (QID) | ORAL | Status: AC | PRN

## 2022-06-23 MED ORDER — SEVELAMER CARBONATE 800 MG PO TABS
1600.0000 mg | ORAL_TABLET | Freq: Three times a day (TID) | ORAL | Status: DC
  Administered 2022-06-24 – 2022-06-30 (×9): 1600 mg via ORAL
  Filled 2022-06-23 (×13): qty 2

## 2022-06-23 MED ORDER — HEPARIN SODIUM (PORCINE) 5000 UNIT/ML IJ SOLN
5000.0000 [IU] | Freq: Three times a day (TID) | INTRAMUSCULAR | Status: DC
  Administered 2022-06-23: 5000 [IU] via SUBCUTANEOUS
  Filled 2022-06-23: qty 1

## 2022-06-23 MED ORDER — HEPARIN (PORCINE) 25000 UT/250ML-% IV SOLN
1200.0000 [IU]/h | INTRAVENOUS | Status: DC
  Administered 2022-06-24: 850 [IU]/h via INTRAVENOUS
  Administered 2022-06-24: 1000 [IU]/h via INTRAVENOUS
  Administered 2022-06-25 – 2022-06-27 (×3): 1150 [IU]/h via INTRAVENOUS
  Administered 2022-06-28: 1200 [IU]/h via INTRAVENOUS
  Filled 2022-06-23 (×6): qty 250

## 2022-06-23 MED ORDER — SODIUM CHLORIDE 0.9 % IV SOLN
2.0000 g | Freq: Once | INTRAVENOUS | Status: AC
  Administered 2022-06-23: 2 g via INTRAVENOUS
  Filled 2022-06-23: qty 12.5

## 2022-06-23 MED ORDER — CLOPIDOGREL BISULFATE 75 MG PO TABS
75.0000 mg | ORAL_TABLET | Freq: Every day | ORAL | Status: DC
  Administered 2022-06-24 – 2022-07-02 (×7): 75 mg via ORAL
  Filled 2022-06-23 (×9): qty 1

## 2022-06-23 MED ORDER — VANCOMYCIN HCL IN DEXTROSE 1-5 GM/200ML-% IV SOLN
1000.0000 mg | Freq: Once | INTRAVENOUS | Status: AC
  Administered 2022-06-23: 1000 mg via INTRAVENOUS
  Filled 2022-06-23: qty 200

## 2022-06-23 MED ORDER — ATORVASTATIN CALCIUM 20 MG PO TABS
20.0000 mg | ORAL_TABLET | Freq: Every day | ORAL | Status: DC
  Administered 2022-06-24 – 2022-07-02 (×6): 20 mg via ORAL
  Filled 2022-06-23 (×6): qty 1

## 2022-06-23 MED ORDER — CALCIUM GLUCONATE-NACL 1-0.675 GM/50ML-% IV SOLN
1.0000 g | Freq: Once | INTRAVENOUS | Status: AC
  Administered 2022-06-23: 1000 mg via INTRAVENOUS
  Filled 2022-06-23: qty 50

## 2022-06-23 MED ORDER — CARVEDILOL 6.25 MG PO TABS
6.2500 mg | ORAL_TABLET | Freq: Two times a day (BID) | ORAL | Status: DC
  Administered 2022-06-24 – 2022-06-30 (×4): 6.25 mg via ORAL
  Filled 2022-06-23 (×6): qty 1

## 2022-06-23 MED ORDER — LORAZEPAM 2 MG/ML IJ SOLN
0.5000 mg | Freq: Once | INTRAMUSCULAR | Status: AC
  Administered 2022-06-24: 0.5 mg via INTRAVENOUS
  Filled 2022-06-23: qty 1

## 2022-06-23 MED ORDER — SODIUM ZIRCONIUM CYCLOSILICATE 10 G PO PACK
10.0000 g | PACK | Freq: Every day | ORAL | Status: DC
  Administered 2022-06-23 – 2022-06-25 (×2): 10 g via ORAL
  Filled 2022-06-23 (×3): qty 1

## 2022-06-23 MED ORDER — HEPARIN BOLUS VIA INFUSION
4000.0000 [IU] | Freq: Once | INTRAVENOUS | Status: AC
  Administered 2022-06-24: 4000 [IU] via INTRAVENOUS
  Filled 2022-06-23: qty 4000

## 2022-06-23 MED ORDER — HYDRALAZINE HCL 20 MG/ML IJ SOLN: 5.0000 mg | Freq: Three times a day (TID) | INTRAMUSCULAR | Status: DC | PRN

## 2022-06-23 NOTE — ED Notes (Addendum)
Lab call Pt's troponin jumped from [phone]. Provider notified. Checked on Pt and performed new EKG with no new changes. Pt resting comfortably

## 2022-06-23 NOTE — ED Triage Notes (Signed)
Pt to ED via [facility] from home for shortness of breath. Pt is dialysis pt, pt last treatment was on Friday. Pt did her full treatment. Pt denies chest pain. Pt tachycardic and tachypneic with EMS. Pt reports symptoms have been going on x 1 week. Pt states that she called EMS because she was sick and has not gotten better. Pt is able to speak in complete sentences

## 2022-06-23 NOTE — H&P (Signed)
History and Physical   [name] XBM:[id number] DOB: 1946/08/04 DOA: 07/04/2022  PCP: Adalberto Ill, MD  Outpatient Specialists: Dr. Candiss Norse, nephrology Patient coming from: Home  I have personally briefly reviewed patient's old medical records in [place].  Chief Concern: Shortness of breath  HPI: Ms. Kathleen Sharp is a 76 year old female with history of end-stage renal disease on hemodialysis Monday Wednesday Friday, hypertension, cardiomegaly, hyperlipidemia, who presents emergency department for chief concerns of shortness of breath.  Initial vitals in the emergency department showed temperature of 97.5, respiration rate of 29, heart rate of 95, blood pressure 108/87, SPO2 of 96% on 4 L nasal cannula.  Serum sodium is 135, potassium 5.9, chloride of 97, bicarb 20, BUN of 63, serum creatinine of 9.33, GFR 4, nonfasting blood glucose 172, WBC 5.9, hemoglobin 11.8, platelets of 269.  Lactic acid is 4.1.  High sensitive troponin is 89.  COVID/influenza A/influenza B PCR pending.  ED treatment: Diltiazem 10 mg IV one-time dose, Lokelma p.o., insulin aspart 10 mg, D50, calcium gluconate, vancomycin and cefepime ------------------ At bedside patient was able to tell me her name, her age, she knows she is in the hospital.  She was able to identify her nieces [name] and [name] at bedside.  She does not appear to be in acute distress.  She was asking about I was room service bringing her food.  She reports that she was feeling shortness of breath and her face turned hot all of a sudden.  She also noticed palpitations.  She denies chest pain, abdominal pain, nausea, vomiting.  She endorses 1 episode of diarrhea on morning on day of hospitalization.  She denies any known fever.  She denies syncope, loss of consciousness.  ROS: Constitutional: no weight change, no fever ENT/Mouth: no sore throat, no rhinorrhea Eyes: no eye pain, no vision changes Cardiovascular: no chest pain, +  dyspnea,  no edema, + palpitations Respiratory: no cough, no sputum, no wheezing Gastrointestinal: no nausea, no vomiting, no diarrhea, no constipation Genitourinary: no urinary incontinence, no dysuria, no hematuria Musculoskeletal: no arthralgias, no myalgias Skin: no skin lesions, no pruritus, Neuro: no weakness, no loss of consciousness, no syncope Psych: no anxiety, no depression, no decrease appetite Heme/Lymph: no bruising, no bleeding  ED Course: Discussed with emergency medicine provider, patient requiring hospitalization for chief concerns of fluid overload/pulmonary edema.  Assessment/Plan  Principal Problem:   Volume overload Active Problems:   Anemia of chronic renal failure, stage 5 (HCC)   End stage renal disease (HCC)   Essential hypertension   Hyperkalemia   Pulmonary edema   Assessment and Plan:  * Volume overload - Presumed secondary to SVT - Nephrology has been consulted for dialysis - Continue 5 L nasal cannula  Pulmonary edema - Presumed secondary to SVT - Strict I's and O's, nephrology consulted  Hyperkalemia - Status post calcium gluconate 1 g IV, Lokelma 10 g one-time dose, insulin aspart 10 units, D50 1 amp - Nephrology has been consulted for dialysis  Essential hypertension - Patient takes Coreg 6.25 mg twice daily, lisinopril 40 mg daily - Coreg has been resumed for 06/24/2022 - Holding home lisinopril at this time due to normal to low normotensive on admission, a.m. team to resume when the benefits outweigh the risk - Hydralazine 5 mg IV every 8 hours as needed for SBP greater than 185, 3 doses ordered  End stage renal disease ([place]) - Via left upper extremity AV fistula - Monday, Wednesday, Friday - last hemodialysis  session was Friday and she received full treatment - Nephrology consulted, and plan for hemodialysis on 06/24/2022  Anemia of chronic renal failure, stage 5 (HCC) - CBC in a.m.  Chart reviewed.   DVT prophylaxis: Heparin  5000 units subcutaneous every 8 hours Code Status: Full code Diet: Renal Family Communication: Updated nieces Toniann Fail and Myriam Jacobson at bedside with patient's permission Disposition Plan: Pending clinical course Consults called: Nephrology Admission status: Progressive cardiac, observation  Past Medical History:  Diagnosis Date   Anemia    Arthritis    Chronic kidney disease    Chronic Kidney Disease   Hypercholesterolemia    Hypertension    Past Surgical History:  Procedure Laterality Date   ABDOMINAL HYSTERECTOMY     AV FISTULA PLACEMENT Left 12/15/2015   Procedure: INSERTION OF ARTERIOVENOUS (AV) GORE-TEX GRAFT ARM             ( BRACHIAL AXILLARY GRAFT );  Surgeon: Renford Dills, MD;  Location: [facility] ORS;  Service: Vascular;  Laterality: Left;   CAPD INSERTION N/A 11/24/2015   Procedure: Exploratory laparoscopy;  Surgeon: Renford Dills, MD;  Location: [facility] OR[facility];  Service: Vascular;  Laterality: N/A;, attempted insertion but unable due to scar tissue   PERIPHERAL VASCULAR CATHETERIZATION Left 02/27/2016   Procedure: Thrombectomy;  Surgeon: Renford Dills, MD;  Location: [facility];  Service: Cardiovascular;  Laterality: Left;   PERIPHERAL VASCULAR CATHETERIZATION Left 04/12/2016   Procedure: Thrombectomy;  Surgeon: Renford Dills, MD;  Location: [facility];  Service: Cardiovascular;  Laterality: Left;   PERIPHERAL VASCULAR CATHETERIZATION Left 05/22/2016   Procedure: Thrombectomy;  Surgeon: Renford Dills, MD;  Location: [facility];  Service: Cardiovascular;  Laterality: Left;   PERIPHERAL VASCULAR CATHETERIZATION N/A 05/22/2016   Procedure: A/V Shuntogram/Fistulagram;  Surgeon: Renford Dills, MD;  Location: [facility];  Service: Cardiovascular;  Laterality: N/A;   PERIPHERAL VASCULAR CATHETERIZATION Left 06/21/2016   Procedure: Thrombectomy;  Surgeon: Renford Dills, MD;  Location: [facility];  Service: Cardiovascular;   Laterality: Left;   Social History:  reports that she quit smoking about 9 years ago. Her smoking use included cigarettes. She has a 15.00 pack-year smoking history. She has never used smokeless tobacco. She reports that she does not drink alcohol and does not use drugs.  No Known Allergies Family History  Problem Relation Age of Onset   Kidney disease Father    Heart disease Mother    Kidney disease Brother    Family history: Family history reviewed and not pertinent  Prior to Admission medications   Medication Sig Start Date End Date Taking? Authorizing Provider  albuterol (VENTOLIN HFA) 108 (90 Base) MCG/ACT inhaler Inhale 2 puffs into the lungs every 6 (six) hours as needed for wheezing or shortness of breath. 08/16/20   Tresa Moore, MD  atorvastatin (LIPITOR) 20 MG tablet Take 20 mg by mouth daily at 6 PM.  09/19/14   [provider]  carvedilol (COREG) 6.25 MG tablet Take 6.25 mg by mouth daily.    [provider]  clopidogrel (PLAVIX) 75 MG tablet Take 75 mg by mouth daily.    [provider]  lidocaine-prilocaine (EMLA) cream APPLY TO ACCESS SITE 30 MINUTES BEFORE TREATMENT 03/14/16   [provider]  lisinopril (ZESTRIL) 40 MG tablet Take 40 mg by mouth daily. 06/09/20   [provider]  multivitamin (RENA-VIT) TABS tablet Take 1 tablet by mouth daily. 03/28/16  [provider]  RENVELA 800 MG tablet Take 1 tablet by mouth 3 (three) times daily. 04/01/16   [provider]   Physical Exam: Vitals:   06/27/2022 1612 07/11/2022 1616 06/18/2022 1630 06/28/2022 1730  BP:  107/82 108/87 109/76  Pulse: (!) 166  95 90  Resp: (!) 26  (!) 29 18  Temp: (!) 97.5 F (36.4 C)     TempSrc: Oral     SpO2: 95%  96% 100%   Constitutional: appears age-appropriate, chronically frail appearing, NAD, calm, comfortable Eyes: PERRL, lids and conjunctivae normal ENMT: Mucous membranes are moist. Posterior pharynx clear of any exudate or  lesions. Age-appropriate dentition. Hearing appropriate Neck: normal, supple, no masses, no thyromegaly Respiratory: clear to auscultation bilaterally, no wheezing, no crackles. Normal respiratory effort. No accessory muscle use.  Cardiovascular: Regular rate and rhythm, no murmurs / rubs / gallops. No extremity edema. 2+ pedal pulses. No carotid bruits.  Abdomen: no tenderness, no masses palpated, no hepatosplenomegaly. Bowel sounds positive.  Musculoskeletal: no clubbing / cyanosis. No joint deformity upper and lower extremities. Good ROM, no contractures, no atrophy. Normal muscle tone.  Skin: no rashes, lesions, ulcers. No induration Neurologic: Sensation intact. Strength 5/5 in all 4.  Psychiatric: Normal judgment and insight. Alert and oriented x 3. Normal mood.   EKG: independently reviewed, showing sinus rhythm with a rate of 96, QTc 492  Chest x-ray on Admission: I personally reviewed and I agree with radiologist reading as below.  DG Chest Portable 1 View  Result Date: 06/22/2022 CLINICAL DATA:  Shortness of breath EXAM: PORTABLE CHEST 1 VIEW COMPARISON:  Multiple chest x-rays, most recently August 14, 2020 FINDINGS: Cardiomegaly. Unchanged mediastinal contours including calcified atherosclerosis of the aortic arch. Diffuse bilateral interstitial pulmonary opacities. Moderate left and small right pleural effusions. No large pneumothorax. No acute osseous abnormality. The visualized upper abdomen is unremarkable. IMPRESSION: 1. Moderate pulmonary edema. 2. Moderate left and small right pleural effusions. 3. Cardiomegaly. Electronically Signed   By: Beryle Flock M.D.   On: 06/18/2022 16:41    Labs on Admission: I have personally reviewed following labs  CBC: Recent Labs  Lab 07/06/2022 1615  WBC 5.9  NEUTROABS 5.2  HGB 11.8*  HCT 38.5  MCV 82.4  PLT 027   Basic Metabolic Panel: Recent Labs  Lab 07/14/2022 1615  NA 135  K 5.9*  CL 97*  CO2 20*  GLUCOSE 172*  BUN 63*   CREATININE 9.33*  CALCIUM 8.3*   GFR: CrCl cannot be calculated (Unknown ideal weight.).  Urine analysis:    Component Value Date/Time   COLORURINE YELLOW (A) 08/14/2020 1447   APPEARANCEUR CLOUDY (A) 08/14/2020 1447   LABSPEC 1.011 08/14/2020 1447   PHURINE 8.0 08/14/2020 1447   GLUCOSEU 50 (A) 08/14/2020 1447   HGBUR NEGATIVE 08/14/2020 1447   BILIRUBINUR NEGATIVE 08/14/2020 1447   KETONESUR NEGATIVE 08/14/2020 1447   PROTEINUR >=300 (A) 08/14/2020 1447   NITRITE NEGATIVE 08/14/2020 1447   LEUKOCYTESUR NEGATIVE 08/14/2020 1447   Dr. Tobie Poet [facility]  If 7PM-7AM, please contact overnight-coverage provider If 7AM-7PM, please contact day coverage provider [email or web address]  06/29/2022, 6:47 PM

## 2022-06-23 NOTE — Progress Notes (Signed)
ANTICOAGULATION CONSULT NOTE  Pharmacy Consult for heparin infusion Indication: ACS/STEMI  No Known Allergies  Patient Measurements: Height: 5\' 2"  (157.5 cm) Weight: 82.1 kg (181 lb) IBW/kg (Calculated) : 50.1 Heparin Dosing Weight: 68.5 kg  Vital Signs: Temp: 97.8 F (36.6 C) (10/08 2142) Temp Source: Oral (10/08 2142) BP: 101/82 (10/08 2142) Pulse Rate: 146 (10/08 2142)  Labs: Recent Labs    07/14/2022 1615 06/26/2022 1846  HGB 11.8*  --   HCT 38.5  --   PLT 269  --   CREATININE 9.33*  --   TROPONINIHS 89* 10,834*    Estimated Creatinine Clearance: 5.3 mL/min (A) (by C-G formula based on SCr of 9.33 mg/dL (H)).   Medical History: Past Medical History:  Diagnosis Date   Anemia    Arthritis    Chronic kidney disease    Chronic Kidney Disease   Hypercholesterolemia    Hypertension     Assessment: Pt is a 76 yo female presenting to ED w/ SOB found with elevated troponin I level, trending up  Goal of Therapy:  Heparin level 0.3-0.7 units/ml Monitor platelets by anticoagulation protocol: Yes   Plan:  Bolus 4000 units x 1 Start heparin infusion at 850 units/hr Will check HL in 8 hr after start of infusion CBC daily while on heparin  Renda Rolls, PharmD, [facility] 07/06/2022 10:20 PM

## 2022-06-23 NOTE — Progress Notes (Incomplete)
       CROSS COVER NOTE  NAME: Kathleen Sharp MRN: [id number] DOB : 29-Sep-1945    Date of Service   07/06/2022   HPI/Events of Note   2157:  Notified of critical Troponin 10834 up from 64 two hours prior drawn at 96 and called to RN at 2001.  Interventions   Assessment/Plan:  NSTEMI Heparin per pharmacy Repeat EKG 0.5 mg IV ativan      This document was prepared using Dragon voice recognition software and may include unintentional dictation errors.  Neomia Glass DNP, MBA, FNP-BC Nurse Practitioner [facility] Pager [phone]

## 2022-06-23 NOTE — ED Notes (Signed)
Pt has her own sevelamer carbonate 800 mg tab. Takes before eating

## 2022-06-23 NOTE — ED Provider Notes (Signed)
[facility] Provider Note    Event Date/Time   First MD Initiated Contact with Patient 07/13/2022 1612     (approximate)   History   Shortness of Breath   HPI  Kathleen Sharp is a 76 y.o. female  who presents to the emergency department today because of concern for  shortness of breath. The patient states she has been short of breath for roughly 1 week. Does have ESRD and is on dialysis. Says she was fluid overloaded and continues to be so even after having her normal dialysis session two days ago. Because of the fluid overload they have asked her to come in tomorrow for further dialysis. The patient says that today she felt worse. Started feeling hot. Denies any chest pain.  Physical Exam   Triage Vital Signs: ED Triage Vitals  Enc Vitals Group     BP 06/25/2022 1616 107/82     Pulse Rate 07/06/2022 1612 (!) 166     Resp 07/02/2022 1612 (!) 26     Temp 06/20/2022 1612 (!) 97.5 F (36.4 C)     Temp Source 07/06/2022 1612 Oral     SpO2 06/22/2022 1612 95 %     Weight --      Height --      Head Circumference --      Peak Flow --      Pain Score 07/12/2022 1607 0     Pain Loc --      Pain Edu? --      Excl. in GC? --     Most recent vital signs: Vitals:   07/11/2022 1612 06/21/2022 1616  BP:  107/82  Pulse: (!) 166   Resp: (!) 26   Temp: (!) 97.5 F (36.4 C)   SpO2: 95%     General: Awake, alert, oriented.  CV:  Good peripheral perfusion. Tachycardia Resp:  Normal effort. Lungs clear. Abd:  No distention.    ED Results / Procedures / Treatments   Labs (all labs ordered are listed, but only abnormal results are displayed) Labs Reviewed  CBC WITH DIFFERENTIAL/PLATELET - Abnormal; Notable for the following components:      Result Value   Hemoglobin 11.8 (*)    MCH 25.3 (*)    RDW 15.9 (*)    Lymphs Abs 0.5 (*)    All other components within normal limits  BASIC METABOLIC PANEL - Abnormal; Notable for the following components:   Potassium 5.9 (*)     Chloride 97 (*)    CO2 20 (*)    Glucose, Bld 172 (*)    BUN 63 (*)    Creatinine, Ser 9.33 (*)    Calcium 8.3 (*)    GFR, Estimated 4 (*)    Anion gap 18 (*)    All other components within normal limits  LACTIC ACID, PLASMA - Abnormal; Notable for the following components:   Lactic Acid, Venous 4.1 (*)    All other components within normal limits  TROPONIN I (HIGH SENSITIVITY) - Abnormal; Notable for the following components:   Troponin I (High Sensitivity) 89 (*)    All other components within normal limits  RESP PANEL BY RT-PCR (FLU A&B, COVID) ARPGX2  LACTIC ACID, PLASMA  HEPATITIS B SURFACE ANTIGEN  HEPATITIS B SURFACE ANTIBODY,QUALITATIVE  HEPATITIS B SURFACE ANTIBODY, QUANTITATIVE  HEPATITIS B CORE ANTIBODY, TOTAL  HEPATITIS C ANTIBODY     EKG  I, Phineas Semen, attending physician, personally viewed and interpreted this EKG  EKG Time: 1609 Rate: 167 Rhythm: svt vs afib with rvr Axis: left axis deviation Intervals: qtc 554 QRS: wide ST changes: no st elevation Impression: abnormal ekg  I, Nance Pear, attending physician, personally viewed and interpreted this EKG  EKG Time: 1628 Rate: 96 Rhythm: sinus rhythm Axis: left axis deviation Intervals: qtc 492 QRS: LAFB ST changes: no st elevation Impression: abnormal ekg    RADIOLOGY I independently interpreted and visualized the CXR. My interpretation: Cardiomegaly Radiology interpretation:  IMPRESSION:  1. Moderate pulmonary edema.  2. Moderate left and small right pleural effusions.  3. Cardiomegaly.      PROCEDURES:  Critical Care performed: Yes, see critical care procedure note(s)  Procedures  CRITICAL CARE Performed by: Nance Pear   Total critical care time: 35 minutes  Critical care time was exclusive of separately billable procedures and treating other patients.  Critical care was necessary to treat or prevent imminent or life-threatening deterioration.  Critical  care was time spent personally by me on the following activities: development of treatment plan with patient and/or surrogate as well as nursing, discussions with consultants, evaluation of patient's response to treatment, examination of patient, obtaining history from patient or surrogate, ordering and performing treatments and interventions, ordering and review of laboratory studies, ordering and review of radiographic studies, pulse oximetry and re-evaluation of patient's condition.   MEDICATIONS ORDERED IN ED: Medications - No data to display   IMPRESSION / MDM / [place] / ED COURSE  I reviewed the triage vital signs and the nursing notes.                              Differential diagnosis includes, but is not limited to, acs, electrolyte abnormality, pneumonia, arrythmia.  Patient's presentation is most consistent with acute presentation with potential threat to life or bodily function.  patient is on the cardiac monitor to evaluate for evidence of arrhythmia and/or significant heart rate changes.  Patient presented to the emergency department today because of concern for shortness of breath. Patient was found to be tachycardic on initial EKG with unclear rhythm. However given heart rates in the 160s she was given diltiazem which did help slow her heart rate. Repeat EKG is consistent with sinus rhythm. Patient did feel improvement after her heart rate was slowed. Blood work here shows elevated troponin, lactic and potassium. At this time do think elevated troponin likely secondary to fast heart rate. Elevated lactic acid level could also be do to tachyarrythmia however given concern for possible infection patient was given IV abx. Given hyperkalemia she was given calcium, insulin and lokelma. Discussed with Dr. Candiss Norse with nephrology. Discussed with Dr. Tobie Poet with the hospitalist service who will plan on admission.      FINAL CLINICAL IMPRESSION(S) / ED DIAGNOSES   Final  diagnoses:  SOB (shortness of breath)  Tachyarrhythmia  Hyperkalemia       Note:  This document was prepared using Dragon voice recognition software and may include unintentional dictation errors.    Nance Pear, MD 07/07/2022 [phone]

## 2022-06-23 NOTE — Hospital Course (Addendum)
Kathleen Sharp is a 76 year old female with history of end-stage renal disease on hemodialysis Monday Wednesday 12/16/22, hypertension, cardiomegaly, hyperlipidemia, who presents emergency department for chief concerns of shortness of breath.  Patient was complaining of shortness of breath with associated flushed face and palpitations.  No chest pain, nausea or vomiting.  Initial vitals in the emergency department showed temperature of 97.5, respiration rate of 29, heart rate of 95, blood pressure 108/87, SPO2 of 96% on 4 L nasal cannula.  Serum sodium is 135, potassium 5.9, chloride of 97, bicarb 20, BUN of 63, serum creatinine of 9.33, GFR 4, nonfasting blood glucose 172, WBC 5.9, hemoglobin 11.8, platelets of 269.  Lactic acid is 4.1.  High sensitive troponin is 89.  COVID/influenza A/influenza B PCR negative.  ED treatment: Diltiazem 10 mg IV one-time dose, Lokelma p.o., insulin aspart 10 mg, D50, calcium gluconate, vancomycin and cefepime.  10/9: Patient overnight had another episode of dyspnea with flushed face and palpitations.  Found to have supraventricular tachycardia requiring IV diltiazem push.  Troponin increased to above 10,000, peaked at 14,207>>13,306.  Potassium at 6.1, BUN 17, creatinine 10.41, it yesterday 144, ALT 77 and anion gap of 16.  BNP >4500, D-dimer 2.66>>3.10. EKG with SVT, peaked T in anteroseptal and ST depression in lateral leads. Patient was started on heparin infusion by night on-call, cardiology and nephrology was consulted.  Going for dialysis this morning. Per patient she never missed any dialysis, last dialysis was on Saturday as she was unable to go for her 2022-12-16 session due to the death of a dear friend. Echocardiogram pending. Patient refused cardiac catheterization.  10/10: Patient had 2 syncopal episodes, one last night and 1 this a.m. rapid response was called.  During this morning episode patient all of a sudden became unresponsive and appears very  confused.  Initial vitals were stable.  Telemetry noted nonsustained V. tach followed by atrial fibrillation with RVR.  While preparing for amiodarone bolus patient converted back to sinus rhythm with heart rate in 60s.  CT chest and CTA was obtained to rule out CVA which was negative for any acute infarct and CTA of head and neck was negative for any significant large vessel stenosis. Her cardiologist was also consulted and she was placed on amiodarone infusion to prevent further arrhythmias. She also received 500 cc of bolus as blood pressure become softer little after this episode.  Hyperkalemia resolved.  Blood pressure little soft, Patient later agrees with cardiac catheterization which is scheduled for Thursday morning now.  Troponin at 11,321 after peaking at 14,207.  Echocardiogram with normal EF, indeterminate diastolic function, no regional wall motion abnormalities and severe calcification of aortic valve without any evidence of stenosis.  10/11: Patient again very lethargic but following some simple commands when seen during morning rounds.  Blood pressure remained on softer side but improved from yesterday.  Telemetry with some paired PVCs and bigeminy. Going for dialysis today.  Cardiac cath planned for tomorrow morning, n.p.o. after midnight.  We will continue with amiodarone and heparin infusion.  Patient will remain high risk for decompensation and other cardiac arrhythmias based on her underlying comorbidities and recent NSTEMI.    Palliative care was consulted.  10/12: Patient continues to be lethargic.  Overnight rapid response was called secondary to change in mental status.  ABG was done which showed elevated PCO2.  Patient was placed on BiPAP which she has been very noncompliant with.  However this morning when I saw her she was able  to identify her relatives at bedside and was a little bit more compliant in wearing the BiPAP.  Cardiology had planned for a heart catheterization  today but secondary to her change in mental status this is being canceled.  10/13: No overnight concerns.  Patient had a shorter dialysis session today due to agitation and requesting to stop it so it was terminated prematurely.  Complaining of lower substernal pain. Discussed with cardiology and her heparin infusion will be discontinued as she is on heparin infusion for more than 72 hours now.  She was started on aspirin along with Plavix. Cardiac cath is not scheduled for Monday at 7:30 AM. She will continue on amiodarone infusion per cardiology as she continued to have transient tachyarrhythmias.  10/14: Patient had another session of dialysis with removal of 700 UF, becoming intermittently hypotensive, otherwise completed the session.  More alert and oriented today.   Had a long discussion with niece about her risk of deterioration and decompensation.  Echo is now planned for Monday.

## 2022-06-23 NOTE — Assessment & Plan Note (Signed)
Seems stable and at baseline. -Continue to monitor

## 2022-06-23 NOTE — Assessment & Plan Note (Signed)
-   Via left upper extremity AV fistula - Monday, Wednesday, Friday - last hemodialysis session was Saturday, she missed her routine dialysis on Friday due to diet of a close friend.  she received full treatment on Saturday - Nephrology consulted, and she is undergoing dialysis today

## 2022-06-23 NOTE — Assessment & Plan Note (Signed)
Slight worsening of calcium despite getting the treatment in ED, potassium at 6.1 this morning. - Status post calcium gluconate 1 g IV, Lokelma 10 g one-time dose, insulin aspart 10 units, D50 1 amp - Getting her dialysis today -Continue to monitor

## 2022-06-23 NOTE — Assessment & Plan Note (Signed)
-   Presumed secondary to SVT - Strict I's and O's, nephrology consulted -Getting her dialysis

## 2022-06-23 NOTE — Assessment & Plan Note (Signed)
-   Presumed secondary to SVT - Nephrology has been consulted for dialysis - Continue with oxygen-wean as tolerated

## 2022-06-23 NOTE — Assessment & Plan Note (Addendum)
-   Patient takes Coreg 6.25 mg twice daily, lisinopril 40 mg daily - Coreg has been resumed for 06/24/2022 -Keep holding lisinopril as she was getting Cardizem for rate control. -As needed hydralazine

## 2022-06-24 ENCOUNTER — Inpatient Hospital Stay
Admit: 2022-06-24 | Discharge: 2022-06-24 | Disposition: A | Discharge status: Home / Self Care | Payer: Medicare HMO | Attending: Internal Medicine | Admitting: Internal Medicine

## 2022-06-24 DIAGNOSIS — N185 Chronic kidney disease, stage 5: Secondary | ICD-10-CM | POA: Diagnosis not present

## 2022-06-24 DIAGNOSIS — N186 End stage renal disease: Secondary | ICD-10-CM | POA: Diagnosis not present

## 2022-06-24 DIAGNOSIS — J90 Pleural effusion, not elsewhere classified: Secondary | ICD-10-CM | POA: Diagnosis not present

## 2022-06-24 DIAGNOSIS — Z515 Encounter for palliative care: Secondary | ICD-10-CM | POA: Diagnosis not present

## 2022-06-24 DIAGNOSIS — J9602 Acute respiratory failure with hypercapnia: Secondary | ICD-10-CM | POA: Diagnosis present

## 2022-06-24 DIAGNOSIS — Z66 Do not resuscitate: Secondary | ICD-10-CM | POA: Diagnosis not present

## 2022-06-24 DIAGNOSIS — R0602 Shortness of breath: Secondary | ICD-10-CM | POA: Diagnosis not present

## 2022-06-24 DIAGNOSIS — G9341 Metabolic encephalopathy: Secondary | ICD-10-CM | POA: Diagnosis present

## 2022-06-24 DIAGNOSIS — I471 Supraventricular tachycardia, unspecified: Secondary | ICD-10-CM | POA: Diagnosis present

## 2022-06-24 DIAGNOSIS — D696 Thrombocytopenia, unspecified: Secondary | ICD-10-CM | POA: Diagnosis not present

## 2022-06-24 DIAGNOSIS — I214 Non-ST elevation (NSTEMI) myocardial infarction: Secondary | ICD-10-CM | POA: Diagnosis present

## 2022-06-24 DIAGNOSIS — Q283 Other malformations of cerebral vessels: Secondary | ICD-10-CM | POA: Diagnosis not present

## 2022-06-24 DIAGNOSIS — J9601 Acute respiratory failure with hypoxia: Secondary | ICD-10-CM | POA: Diagnosis present

## 2022-06-24 DIAGNOSIS — Z20822 Contact with and (suspected) exposure to covid-19: Secondary | ICD-10-CM | POA: Diagnosis present

## 2022-06-24 DIAGNOSIS — E8770 Fluid overload, unspecified: Secondary | ICD-10-CM | POA: Diagnosis not present

## 2022-06-24 DIAGNOSIS — I6381 Other cerebral infarction due to occlusion or stenosis of small artery: Secondary | ICD-10-CM | POA: Diagnosis not present

## 2022-06-24 DIAGNOSIS — J323 Chronic sphenoidal sinusitis: Secondary | ICD-10-CM | POA: Diagnosis not present

## 2022-06-24 DIAGNOSIS — N2581 Secondary hyperparathyroidism of renal origin: Secondary | ICD-10-CM | POA: Diagnosis not present

## 2022-06-24 DIAGNOSIS — E669 Obesity, unspecified: Secondary | ICD-10-CM | POA: Diagnosis present

## 2022-06-24 DIAGNOSIS — I132 Hypertensive heart and chronic kidney disease with heart failure and with stage 5 chronic kidney disease, or end stage renal disease: Secondary | ICD-10-CM | POA: Diagnosis present

## 2022-06-24 DIAGNOSIS — Z7189 Other specified counseling: Secondary | ICD-10-CM | POA: Diagnosis not present

## 2022-06-24 DIAGNOSIS — N179 Acute kidney failure, unspecified: Secondary | ICD-10-CM | POA: Diagnosis present

## 2022-06-24 DIAGNOSIS — E875 Hyperkalemia: Secondary | ICD-10-CM | POA: Diagnosis not present

## 2022-06-24 DIAGNOSIS — R578 Other shock: Secondary | ICD-10-CM | POA: Diagnosis not present

## 2022-06-24 DIAGNOSIS — I472 Ventricular tachycardia, unspecified: Secondary | ICD-10-CM | POA: Diagnosis not present

## 2022-06-24 DIAGNOSIS — D62 Acute posthemorrhagic anemia: Secondary | ICD-10-CM | POA: Diagnosis not present

## 2022-06-24 DIAGNOSIS — I70201 Unspecified atherosclerosis of native arteries of extremities, right leg: Secondary | ICD-10-CM | POA: Diagnosis not present

## 2022-06-24 DIAGNOSIS — I672 Cerebral atherosclerosis: Secondary | ICD-10-CM | POA: Diagnosis not present

## 2022-06-24 DIAGNOSIS — E872 Acidosis, unspecified: Secondary | ICD-10-CM | POA: Diagnosis present

## 2022-06-24 DIAGNOSIS — M47812 Spondylosis without myelopathy or radiculopathy, cervical region: Secondary | ICD-10-CM | POA: Diagnosis not present

## 2022-06-24 DIAGNOSIS — I724 Aneurysm of artery of lower extremity: Secondary | ICD-10-CM | POA: Diagnosis present

## 2022-06-24 DIAGNOSIS — Z9889 Other specified postprocedural states: Secondary | ICD-10-CM | POA: Diagnosis not present

## 2022-06-24 DIAGNOSIS — I729 Aneurysm of unspecified site: Secondary | ICD-10-CM | POA: Diagnosis not present

## 2022-06-24 DIAGNOSIS — K920 Hematemesis: Secondary | ICD-10-CM | POA: Diagnosis not present

## 2022-06-24 DIAGNOSIS — I6503 Occlusion and stenosis of bilateral vertebral arteries: Secondary | ICD-10-CM | POA: Diagnosis not present

## 2022-06-24 DIAGNOSIS — I4891 Unspecified atrial fibrillation: Secondary | ICD-10-CM | POA: Diagnosis not present

## 2022-06-24 DIAGNOSIS — T81718A Complication of other artery following a procedure, not elsewhere classified, initial encounter: Secondary | ICD-10-CM | POA: Diagnosis not present

## 2022-06-24 DIAGNOSIS — I701 Atherosclerosis of renal artery: Secondary | ICD-10-CM | POA: Diagnosis not present

## 2022-06-24 DIAGNOSIS — I25110 Atherosclerotic heart disease of native coronary artery with unstable angina pectoris: Secondary | ICD-10-CM | POA: Diagnosis present

## 2022-06-24 DIAGNOSIS — I5031 Acute diastolic (congestive) heart failure: Secondary | ICD-10-CM | POA: Diagnosis present

## 2022-06-24 DIAGNOSIS — D631 Anemia in chronic kidney disease: Secondary | ICD-10-CM | POA: Diagnosis present

## 2022-06-24 DIAGNOSIS — Z992 Dependence on renal dialysis: Secondary | ICD-10-CM | POA: Diagnosis not present

## 2022-06-24 LAB — RENAL FUNCTION PANEL
Albumin: 3.6 g/dL (ref 3.5–5.0)
Anion gap: 17 — ABNORMAL HIGH (ref 5–15)
BUN: 76 mg/dL — ABNORMAL HIGH (ref 8–23)
CO2: 21 mmol/L — ABNORMAL LOW (ref 22–32)
Calcium: 8.8 mg/dL — ABNORMAL LOW (ref 8.9–10.3)
Chloride: 99 mmol/L (ref 98–111)
Creatinine, Ser: 10.67 mg/dL — ABNORMAL HIGH (ref 0.44–1.00)
GFR, Estimated: 3 mL/min — ABNORMAL LOW (ref >60)
Glucose, Bld: 72 mg/dL (ref 70–99)
Phosphorus: 7.5 mg/dL — ABNORMAL HIGH (ref 2.5–4.6)
Potassium: 6.2 mmol/L — ABNORMAL HIGH (ref 3.5–5.1)
Sodium: 137 mmol/L (ref 135–145)

## 2022-06-24 LAB — CBC
HCT: 34.9 % — ABNORMAL LOW (ref 36.0–46.0)
HCT: 36.5 % (ref 36.0–46.0)
HCT: 38.6 % (ref 36.0–46.0)
Hemoglobin: 10.9 g/dL — ABNORMAL LOW (ref 12.0–15.0)
Hemoglobin: 11.6 g/dL — ABNORMAL LOW (ref 12.0–15.0)
Hemoglobin: 12.1 g/dL (ref 12.0–15.0)
MCH: 24.7 pg — ABNORMAL LOW (ref 26.0–34.0)
MCH: 25 pg — ABNORMAL LOW (ref 26.0–34.0)
MCH: 25.2 pg — ABNORMAL LOW (ref 26.0–34.0)
MCHC: 31.2 g/dL (ref 30.0–36.0)
MCHC: 31.3 g/dL (ref 30.0–36.0)
MCHC: 31.8 g/dL (ref 30.0–36.0)
MCV: 78.9 fL — ABNORMAL LOW (ref 80.0–100.0)
MCV: 79.3 fL — ABNORMAL LOW (ref 80.0–100.0)
MCV: 80 fL (ref 80.0–100.0)
Platelets: 232 10*3/uL (ref 150–400)
Platelets: 235 10*3/uL (ref 150–400)
Platelets: 242 10*3/uL (ref 150–400)
RBC: 4.36 MIL/uL (ref 3.87–5.11)
RBC: 4.6 MIL/uL (ref 3.87–5.11)
RBC: 4.89 MIL/uL (ref 3.87–5.11)
RDW: 15.7 % — ABNORMAL HIGH (ref 11.5–15.5)
RDW: 15.7 % — ABNORMAL HIGH (ref 11.5–15.5)
RDW: 15.8 % — ABNORMAL HIGH (ref 11.5–15.5)
WBC: 12.3 10*3/uL — ABNORMAL HIGH (ref 4.0–10.5)
WBC: 13.2 10*3/uL — ABNORMAL HIGH (ref 4.0–10.5)
WBC: 7.5 10*3/uL (ref 4.0–10.5)
nRBC: 0 % (ref 0.0–0.2)
nRBC: 0 % (ref 0.0–0.2)
nRBC: 0 % (ref 0.0–0.2)

## 2022-06-24 LAB — COMPREHENSIVE METABOLIC PANEL
ALT: 77 U/L — ABNORMAL HIGH (ref 0–44)
ALT: 118 U/L — ABNORMAL HIGH (ref 0–44)
AST: 144 U/L — ABNORMAL HIGH (ref 15–41)
AST: 144 U/L — ABNORMAL HIGH (ref 15–41)
Albumin: 3.6 g/dL (ref 3.5–5.0)
Albumin: 3.4 g/dL — ABNORMAL LOW (ref 3.5–5.0)
Alkaline Phosphatase: 59 U/L (ref 38–126)
Alkaline Phosphatase: 56 U/L (ref 38–126)
Anion gap: 16 — ABNORMAL HIGH (ref 5–15)
Anion gap: 23 — ABNORMAL HIGH (ref 5–15)
BUN: 72 mg/dL — ABNORMAL HIGH (ref 8–23)
BUN: 39 mg/dL — ABNORMAL HIGH (ref 8–23)
CO2: 23 mmol/L (ref 22–32)
CO2: 24 mmol/L (ref 22–32)
Calcium: 9.2 mg/dL (ref 8.9–10.3)
Calcium: 8.6 mg/dL — ABNORMAL LOW (ref 8.9–10.3)
Chloride: 100 mmol/L (ref 98–111)
Chloride: 90 mmol/L — ABNORMAL LOW (ref 98–111)
Creatinine, Ser: 10.41 mg/dL — ABNORMAL HIGH (ref 0.44–1.00)
Creatinine, Ser: 6.68 mg/dL — ABNORMAL HIGH (ref 0.44–1.00)
GFR, Estimated: 4 mL/min — ABNORMAL LOW (ref >60)
GFR, Estimated: 6 mL/min — ABNORMAL LOW (ref >60)
Glucose, Bld: 90 mg/dL (ref 70–99)
Glucose, Bld: 137 mg/dL — ABNORMAL HIGH (ref 70–99)
Potassium: 6.1 mmol/L — ABNORMAL HIGH (ref 3.5–5.1)
Potassium: 4.9 mmol/L (ref 3.5–5.1)
Sodium: 139 mmol/L (ref 135–145)
Sodium: 137 mmol/L (ref 135–145)
Total Bilirubin: 0.8 mg/dL (ref 0.3–1.2)
Total Bilirubin: 0.9 mg/dL (ref 0.3–1.2)
Total Protein: 6.9 g/dL (ref 6.5–8.1)
Total Protein: 6.7 g/dL (ref 6.5–8.1)

## 2022-06-24 LAB — MRSA NEXT GEN BY PCR, NASAL: MRSA by PCR Next Gen: NOT DETECTED

## 2022-06-24 LAB — TROPONIN I (HIGH SENSITIVITY)
Troponin I (High Sensitivity): 14207 ng/L (ref <18)
Troponin I (High Sensitivity): 13306 ng/L (ref <18)
Troponin I (High Sensitivity): 11276 ng/L (ref <18)

## 2022-06-24 LAB — POTASSIUM: Potassium: 3 mmol/L — ABNORMAL LOW (ref 3.5–5.1)

## 2022-06-24 LAB — ECHOCARDIOGRAM COMPLETE BUBBLE STUDY: S' Lateral: 3 cm

## 2022-06-24 LAB — BASIC METABOLIC PANEL
Anion gap: 17 — ABNORMAL HIGH (ref 5–15)
BUN: 72 mg/dL — ABNORMAL HIGH (ref 8–23)
CO2: 21 mmol/L — ABNORMAL LOW (ref 22–32)
Calcium: 8.5 mg/dL — ABNORMAL LOW (ref 8.9–10.3)
Chloride: 99 mmol/L (ref 98–111)
Creatinine, Ser: 10.13 mg/dL — ABNORMAL HIGH (ref 0.44–1.00)
GFR, Estimated: 4 mL/min — ABNORMAL LOW (ref >60)
Glucose, Bld: 148 mg/dL — ABNORMAL HIGH (ref 70–99)
Potassium: 6.1 mmol/L — ABNORMAL HIGH (ref 3.5–5.1)
Sodium: 137 mmol/L (ref 135–145)

## 2022-06-24 LAB — PROTIME-INR
INR: 1.3 — ABNORMAL HIGH (ref 0.8–1.2)
Prothrombin Time: 15.6 seconds — ABNORMAL HIGH (ref 11.4–15.2)

## 2022-06-24 LAB — HEPATITIS B CORE ANTIBODY, TOTAL: Hep B Core Total Ab: NONREACTIVE

## 2022-06-24 LAB — APTT: aPTT: 23 seconds — ABNORMAL LOW (ref 24–36)

## 2022-06-24 LAB — HEPATITIS B SURFACE ANTIGEN: Hepatitis B Surface Ag: NONREACTIVE

## 2022-06-24 LAB — BRAIN NATRIURETIC PEPTIDE
B Natriuretic Peptide: >4500 pg/mL — ABNORMAL HIGH (ref 0.0–100.0)
B Natriuretic Peptide: >4500 pg/mL — ABNORMAL HIGH (ref 0.0–100.0)

## 2022-06-24 LAB — GLUCOSE, CAPILLARY: Glucose-Capillary: 91 mg/dL (ref 70–99)

## 2022-06-24 LAB — D-DIMER, QUANTITATIVE
D-Dimer, Quant: 2.66 ug/mL-FEU — ABNORMAL HIGH (ref 0.00–0.50)
D-Dimer, Quant: 3.1 ug/mL-FEU — ABNORMAL HIGH (ref 0.00–0.50)

## 2022-06-24 LAB — MAGNESIUM: Magnesium: 1.8 mg/dL (ref 1.7–2.4)

## 2022-06-24 LAB — HEPATITIS B SURFACE ANTIBODY,QUALITATIVE: Hep B S Ab: REACTIVE — AB

## 2022-06-24 LAB — HEPARIN LEVEL (UNFRACTIONATED)
Heparin Unfractionated: 0.35 IU/mL (ref 0.30–0.70)
Heparin Unfractionated: 0.24 IU/mL — ABNORMAL LOW (ref 0.30–0.70)

## 2022-06-24 LAB — HEPATITIS C ANTIBODY: HCV Ab: NONREACTIVE

## 2022-06-24 MED ORDER — LIDOCAINE-PRILOCAINE 2.5-2.5 % EX CREA: 1.0000 | TOPICAL_CREAM | CUTANEOUS | Status: DC | PRN

## 2022-06-24 MED ORDER — HEPARIN SODIUM (PORCINE) 1000 UNIT/ML DIALYSIS: 20.0000 [IU]/kg | INTRAMUSCULAR | Status: DC | PRN

## 2022-06-24 MED ORDER — HEPARIN SODIUM (PORCINE) 1000 UNIT/ML DIALYSIS: 1000.0000 [IU] | INTRAMUSCULAR | Status: DC | PRN

## 2022-06-24 MED ORDER — DILTIAZEM HCL-DEXTROSE 125-5 MG/125ML-% IV SOLN (PREMIX)
5.0000 mg/h | INTRAVENOUS | Status: DC
  Administered 2022-06-24: 5 mg/h via INTRAVENOUS
  Filled 2022-06-24: qty 125

## 2022-06-24 MED ORDER — FUROSEMIDE 10 MG/ML IJ SOLN
80.0000 mg | Freq: Once | INTRAMUSCULAR | Status: AC
  Administered 2022-06-24: 80 mg via INTRAVENOUS
  Filled 2022-06-24: qty 8

## 2022-06-24 MED ORDER — DEXTROSE 50 % IV SOLN
1.0000 | Freq: Once | INTRAVENOUS | Status: AC
  Administered 2022-06-24: 50 mL via INTRAVENOUS
  Filled 2022-06-24: qty 50

## 2022-06-24 MED ORDER — ANTICOAGULANT SODIUM CITRATE 4% (200MG/5ML) IV SOLN: 5.0000 mL | Status: DC | PRN

## 2022-06-24 MED ORDER — ALTEPLASE 2 MG IJ SOLR: 2.0000 mg | Freq: Once | INTRAMUSCULAR | Status: DC | PRN

## 2022-06-24 MED ORDER — PENTAFLUOROPROP-TETRAFLUOROETH EX AERO: 1.0000 | INHALATION_SPRAY | CUTANEOUS | Status: DC | PRN

## 2022-06-24 MED ORDER — DILTIAZEM HCL 25 MG/5ML IV SOLN
10.0000 mg | Freq: Once | INTRAVENOUS | Status: AC
  Administered 2022-06-24: 10 mg via INTRAVENOUS
  Filled 2022-06-24: qty 5

## 2022-06-24 MED ORDER — CALCIUM GLUCONATE-NACL 1-0.675 GM/50ML-% IV SOLN
1.0000 g | Freq: Once | INTRAVENOUS | Status: AC
  Administered 2022-06-24: 1000 mg via INTRAVENOUS
  Filled 2022-06-24: qty 50

## 2022-06-24 MED ORDER — LIDOCAINE HCL (PF) 1 % IJ SOLN: 5.0000 mL | INTRAMUSCULAR | Status: DC | PRN

## 2022-06-24 MED ORDER — HEPARIN BOLUS VIA INFUSION
1000.0000 [IU] | Freq: Once | INTRAVENOUS | Status: AC
  Administered 2022-06-24: 1000 [IU] via INTRAVENOUS
  Filled 2022-06-24: qty 1000

## 2022-06-24 MED ORDER — INSULIN ASPART 100 UNIT/ML IJ SOLN
10.0000 [IU] | Freq: Once | INTRAMUSCULAR | Status: AC
  Administered 2022-06-24: 10 [IU] via INTRAVENOUS
  Filled 2022-06-24: qty 1

## 2022-06-24 NOTE — Progress Notes (Signed)
ANTICOAGULATION CONSULT NOTE  Pharmacy Consult for heparin infusion Indication: ACS/STEMI  No Known Allergies  Patient Measurements: Height: 5\' 2"  (157.5 cm) Weight: 82.1 kg (181 lb) IBW/kg (Calculated) : 50.1 Heparin Dosing Weight: 68.5 kg  Vital Signs: Temp: 98.5 F (36.9 C) (10/09 1645) Temp Source: Oral (10/09 1645) BP: 120/77 (10/09 1645) Pulse Rate: 83 (10/09 1645)  Labs: Recent Labs    06/18/2022 1615 07/07/2022 1846 06/22/2022 2350 06/24/22 0005 06/24/22 0537 06/24/22 0953 06/24/22 1753  HGB 11.8*  --  12.1  --  11.6*  --   --   HCT 38.5  --  38.6  --  36.5  --   --   PLT 269  --  232  --  235  --   --   APTT  --   --  23*  --   --   --   --   LABPROT  --   --  15.6*  --   --   --   --   INR  --   --  1.3*  --   --   --   --   HEPARINUNFRC  --   --   --   --   --  0.35 0.24*  CREATININE 9.33*  --  10.13*  --  10.41* 10.67*  --   TROPONINIHS 89* 10,834* 14,207* 13,306*  --   --   --      Estimated Creatinine Clearance: 4.6 mL/min (A) (by C-G formula based on SCr of 10.67 mg/dL (H)).   Medical History: Past Medical History:  Diagnosis Date   Anemia    Arthritis    Chronic kidney disease    Chronic Kidney Disease   Hypercholesterolemia    Hypertension     Assessment: Pt is a 76 yo female presenting to ED w/ SOB found with elevated troponin I level, trending up  Goal of Therapy:  Heparin level 0.3-0.7 units/ml Monitor platelets by anticoagulation protocol: Yes  Date Time HL Rate/Comment 10/9 0953 0.35 Therapeutic x1 10/9 1753 0.24 Subtherapeutic  Plan:  Bolus 1,000 units Increase heparin infusion to 1,000 units/hr Check anti-Xa level in 8 hours and daily once consecutively therapeutic. Continue to monitor H&H and platelets daily while on heparin gtt.'   [place] Pharmacist 06/24/2022 6:52 PM

## 2022-06-24 NOTE — Progress Notes (Signed)
Rapid response called at 20:37. Rob Therapist, sports Scientist, research (life sciences)) responded due to this RN being involved in another emergency. Rechecked at this time on patient. Patient alert and oriented surrounded by visitors. Reports feels well and has no needs from this RN at this time.

## 2022-06-24 NOTE — Progress Notes (Signed)
Rapid response. Chaplain provided supportive presence to four nieces during rapid response. After rapid response, chaplain engaged Kathleen Sharp and her nieces in conversations regarding her upcoming Cath Lab scheduled for tomorrow. Kathleen Sharp was unsure what the procedure was and is now feeling less anxious about procedure. Kathleen Sharp relies upon her nieces for decisions making as is a widow with no children. Kathleen Sharp desires to continue live while gaining comfort with ongoing heart struggles. NP added consult for chaplain to follow up following procedure for conversations regarding a recent death of a church friend that may be increasing anxiety regarding end of life. Kathleen Sharp was able to name talking on the phone and laughter as her means of importance. She is supported by a large family and church community.      06/24/22 2100  Clinical Encounter Type  Visited With Patient and family together  Visit Type Initial  Referral From Nurse  Consult/Referral To Chaplain

## 2022-06-24 NOTE — Progress Notes (Signed)
       CROSS COVER NOTE  NAME: Kathleen Sharp MRN: [id number] DOB : 1945/10/22    Date of Service   06/24/2022   HPI/Events of Note   Rapid Response called because Kathleen Sharp had what is described as a syncopal event after she returned to bed from ambulating to the bathroom. Staff report she was initially difficult to arouse.   On arrival to bedside Kathleen Sharp was sleeping but responded to her name being called and able to hold a conversation without falling back asleep. She is smiling and reports feeling fine. She reports walking to the bathroom was exhausting for her. She also endorses being emotionally exhausted and overwhelmed after the unexpected death of a friend in the past few weeks.  BP 111/75 HR 98 RR 20 SPO2 94% on 2L [place]   Interventions   Assessment/Plan:  EKG - unchanged from prior CBC, CMP, Mg, Trop - all unrevealing Orthostatics in AM Consult to Chaplain for follow up       This document was prepared using Dragon voice recognition software and may include unintentional dictation errors.  Neomia Glass DNP, MBA, FNP-BC Nurse Practitioner [facility] Pager [phone]

## 2022-06-24 NOTE — ED Notes (Signed)
Kathleen Chew, MD sent message regarding current patient VS and that Cardizem gtt stopped at this time.

## 2022-06-24 NOTE — ED Notes (Signed)
Report given to dialysis at this time.

## 2022-06-24 NOTE — Progress Notes (Signed)
*  PRELIMINARY RESULTS* Echocardiogram 2D Echocardiogram has been performed.  Sherrie Sport 06/24/2022, 1:37 PM

## 2022-06-24 NOTE — Progress Notes (Signed)
[facility]  ROUNDING NOTE   Subjective:   Kathleen Sharp is a 76 year old female with past medical conditions including hyperlipidemia, hypertension, cardiomegaly, and end-stage renal disease on hemodialysis.  Patient presents to the emergency department with complaints of shortness of breath and has been admitted for Volume overload [E87.70]  Patient is known to our practice and receives outpatient dialysis treatments at [facility] on a MWF schedule, supervised by Dr. Candiss Norse.  Patient received full treatment on Friday.  She denies missing recent dialysis treatments.  She states she progressively became short of breath over the past week, unable to identify precipitating factors.  Denies nausea or vomiting.  Reports diarrhea yesterday.  Denies chest pain or discomfort.  Does not recall feeling flushed this morning once shortness of breath started to worsen.  No known fever or chills.  Labs on ED arrival include sodium 135, potassium 5.9, serum bicarb 20, glucose 172, BUN 63, creatinine 9.33 with GFR 4, troponin 89, lactic acid 4.1 and hemoglobin 11.8.  In 2 hours, troponin increased to greater than 10,000.  Respiratory panel negative for influenza and COVID-19.  Chest x-ray shows moderate pulmonary edema with left and right small pleural effusions.  Potassium treated in ED with hyper kalemia protocol, however potassium increased 6.1.  We have been consulted to provide dialysis during this admission.   Objective:  Vital signs in last 24 hours:  Temp:  [97.5 F (36.4 C)-97.9 F (36.6 C)] 97.6 F (36.4 C) (10/09 1342) Pulse Rate:  [25-166] 69 (10/09 1400) Resp:  [14-35] 19 (10/09 1400) BP: (85-140)/(68-119) 118/77 (10/09 1400) SpO2:  [93 %-100 %] 100 % (10/09 1400) Weight:  [82.1 kg-84.6 kg] 82.1 kg (10/09 1343)  Weight change:  Filed Weights   07/12/2022 2141 06/24/22 0930 06/24/22 1343  Weight: 82.1 kg 84.6 kg 82.1 kg    Intake/Output: I/O last 3 completed  shifts: In: 400 [IV Piggyback:400] Out: -    Intake/Output this shift:  Total I/O In: 38.7 [I.V.:38.7] Out: 2500 [Other:2500]  Physical Exam: General: NAD, resting comfortably  Head: Normocephalic, atraumatic. Moist oral mucosal membranes  Eyes: Anicteric  Lungs:  Diminished in bases, normal effort, [place] O2  Heart: Regular rate and rhythm  Abdomen:  Soft, nontender, obese  Extremities: No peripheral edema.  Neurologic: Nonfocal, moving all four extremities  Skin: No lesions  Access: Left AVG    Basic Metabolic Panel: Recent Labs  Lab 07/15/2022 1615 06/20/2022 2350 06/24/22 0537 06/24/22 0953 06/24/22 1330  NA 135 137 139 137  --   K 5.9* 6.1* 6.1* 6.2* 3.0*  CL 97* 99 100 99  --   CO2 20* 21* 23 21*  --   GLUCOSE 172* 148* 90 72  --   BUN 63* 72* 72* 76*  --   CREATININE 9.33* 10.13* 10.41* 10.67*  --   CALCIUM 8.3* 8.5* 9.2 8.8*  --   PHOS  --   --   --  7.5*  --     Liver Function Tests: Recent Labs  Lab 06/24/22 0537 06/24/22 0953  AST 144*  --   ALT 77*  --   ALKPHOS 59  --   BILITOT 0.8  --   PROT 6.9  --   ALBUMIN 3.6 3.6   No results for input(s): "LIPASE", "AMYLASE" in the last 168 hours. No results for input(s): "AMMONIA" in the last 168 hours.  CBC: Recent Labs  Lab 07/13/2022 1615 07/12/2022 2350 06/24/22 0537  WBC 5.9 7.5 13.2*  NEUTROABS 5.2  --   --   HGB 11.8* 12.1 11.6*  HCT 38.5 38.6 36.5  MCV 82.4 78.9* 79.3*  PLT 269 232 235    Cardiac Enzymes: No results for input(s): "CKTOTAL", "CKMB", "CKMBINDEX", "TROPONINI" in the last 168 hours.  BNP: Invalid input(s): "POCBNP"  CBG: No results for input(s): "GLUCAP" in the last 168 hours.  Microbiology: Results for orders placed or performed during the hospital encounter of 06/22/2022  Resp Panel by RT-PCR (Flu A&B, Covid) Anterior Nasal Swab     Status: None   Collection Time: 07/04/2022  5:13 PM   Specimen: Anterior Nasal Swab  Result Value Ref Range Status   SARS Coronavirus 2 by RT  PCR NEGATIVE NEGATIVE Final    Comment: (NOTE) SARS-CoV-2 target nucleic acids are NOT DETECTED.  The SARS-CoV-2 RNA is generally detectable in upper respiratory specimens during the acute phase of infection. The lowest concentration of SARS-CoV-2 viral copies this assay can detect is 138 copies/mL. A negative result does not preclude SARS-Cov-2 infection and should not be used as the sole basis for treatment or other patient management decisions. A negative result may occur with  improper specimen collection/handling, submission of specimen other than nasopharyngeal swab, presence of viral mutation(s) within the areas targeted by this assay, and inadequate number of viral copies(<138 copies/mL). A negative result must be combined with clinical observations, patient history, and epidemiological information. The expected result is Negative.  Fact Sheet for Patients:  EntrepreneurPulse.com.au  Fact Sheet for Healthcare Providers:  IncredibleEmployment.be  This test is no t yet approved or cleared by the [place] FDA and  has been authorized for detection and/or diagnosis of SARS-CoV-2 by FDA under an Emergency Use Authorization (EUA). This EUA will remain  in effect (meaning this test can be used) for the duration of the COVID-19 declaration under Section 564(b)(1) of the Act, 21 U.S.C.section 360bbb-3(b)(1), unless the authorization is terminated  or revoked sooner.       Influenza A by PCR NEGATIVE NEGATIVE Final   Influenza B by PCR NEGATIVE NEGATIVE Final    Comment: (NOTE) The Xpert Xpress SARS-CoV-2/FLU/RSV plus assay is intended as an aid in the diagnosis of influenza from Nasopharyngeal swab specimens and should not be used as a sole basis for treatment. Nasal washings and aspirates are unacceptable for Xpert Xpress SARS-CoV-2/FLU/RSV testing.  Fact Sheet for Patients: EntrepreneurPulse.com.au  Fact Sheet for  Healthcare Providers: IncredibleEmployment.be  This test is not yet approved or cleared by the [place] FDA and has been authorized for detection and/or diagnosis of SARS-CoV-2 by FDA under an Emergency Use Authorization (EUA). This EUA will remain in effect (meaning this test can be used) for the duration of the COVID-19 declaration under Section 564(b)(1) of the Act, 21 U.S.C. section 360bbb-3(b)(1), unless the authorization is terminated or revoked.  Performed at [facility], [address] [place], [place] [address]     Coagulation Studies: Recent Labs    07/11/2022 2350  LABPROT 15.6*  INR 1.3*    Urinalysis: No results for input(s): "COLORURINE", "LABSPEC", "PHURINE", "GLUCOSEU", "HGBUR", "BILIRUBINUR", "KETONESUR", "PROTEINUR", "UROBILINOGEN", "NITRITE", "LEUKOCYTESUR" in the last 72 hours.  Invalid input(s): "APPERANCEUR"    Imaging: DG Chest Portable 1 View  Result Date: 06/27/2022 CLINICAL DATA:  Shortness of breath EXAM: PORTABLE CHEST 1 VIEW COMPARISON:  Multiple chest x-rays, most recently August 14, 2020 FINDINGS: Cardiomegaly. Unchanged mediastinal contours including calcified atherosclerosis of the aortic arch. Diffuse bilateral interstitial pulmonary opacities. Moderate left and small  right pleural effusions. No large pneumothorax. No acute osseous abnormality. The visualized upper abdomen is unremarkable. IMPRESSION: 1. Moderate pulmonary edema. 2. Moderate left and small right pleural effusions. 3. Cardiomegaly. Electronically Signed   By: Beryle Flock M.D.   On: 07/07/2022 16:41     Medications:    anticoagulant sodium citrate     diltiazem (CARDIZEM) infusion Stopped (06/24/22 1104)   heparin 850 Units/hr (06/24/22 0036)    atorvastatin  20 mg Oral q1800   carvedilol  6.25 mg Oral BID WC   Chlorhexidine Gluconate Cloth  6 each Topical Q0600   clopidogrel  75 mg Oral Daily   sevelamer carbonate  1,600 mg Oral TID  WC   sodium zirconium cyclosilicate  10 g Oral Daily   acetaminophen **OR** acetaminophen, alteplase, anticoagulant sodium citrate, heparin, heparin, hydrALAZINE, lidocaine (PF), lidocaine-prilocaine, ondansetron **OR** ondansetron (ZOFRAN) IV, pentafluoroprop-tetrafluoroeth, senna-docusate  Assessment/ Plan:  Kathleen Sharp is a 76 y.o.  female with past medical conditions including hyperlipidemia, hypertension, cardiomegaly, and end-stage renal disease on hemodialysis.  Patient presents to the emergency department with complaints of shortness of breath and has been admitted for Volume overload [E87.70]  [facility]/MWF/left AVG/82 kg  Volume overload/hyperkalemia with end-stage renal disease on hemodialysis.  Last treatment received on Friday.  Potassium on ED arrival 5.9, increased to 6.1 after pharmacologic treatment.  Chest x-ray shows moderate pulmonary edema with bilateral pleural effusions.  Patient received dialysis on 1K bath to correct potassium.  UF goal 2.5 L achieved.  We will recheck potassium 2 hours posttreatment.  Next treatment scheduled for Wednesday unless shortness of breath persist, will offer extra treatment tomorrow.  2. Anemia of chronic kidney disease Lab Results  Component Value Date   HGB 11.6 (L) 06/24/2022    Hemoglobin within desired target.  We will continue to monitor  3. Secondary Hyperparathyroidism: with outpatient labs: PTH 798, phosphorus 8.5, calcium 8.0 on 04/29/22.   Lab Results  Component Value Date   CALCIUM 8.8 (L) 06/24/2022   PHOS 7.5 (H) 06/24/2022    Calcium within desired target however phosphorus remains elevated.  Sevelamer ordered with meals.  4.  Hypertension with chronic kidney disease.  Home regimen includes carvedilol and lisinopril.  Currently receiving carvedilol and diltiazem drip.   LOS: 0   10/9/20233:47 PM

## 2022-06-24 NOTE — ED Notes (Signed)
Dialysis RN at bedside with patient.

## 2022-06-24 NOTE — Progress Notes (Signed)
Progress Note   Patient: Kathleen Sharp CVE:[id number] DOB: May 29, 1946 DOA: 07/02/2022     0 DOS: the patient was seen and examined on 06/24/2022   Brief hospital course: Kathleen Sharp is a 76 year old female with history of end-stage renal disease on hemodialysis Monday Wednesday 12-02-22, hypertension, cardiomegaly, hyperlipidemia, who presents emergency department for chief concerns of shortness of breath.  Patient was complaining of shortness of breath with associated flushed face and palpitations.  No chest pain, nausea or vomiting.  Initial vitals in the emergency department showed temperature of 97.5, respiration rate of 29, heart rate of 95, blood pressure 108/87, SPO2 of 96% on 4 L nasal cannula.  Serum sodium is 135, potassium 5.9, chloride of 97, bicarb 20, BUN of 63, serum creatinine of 9.33, GFR 4, nonfasting blood glucose 172, WBC 5.9, hemoglobin 11.8, platelets of 269.  Lactic acid is 4.1.  High sensitive troponin is 89.  COVID/influenza A/influenza B PCR negative.  ED treatment: Diltiazem 10 mg IV one-time dose, Lokelma p.o., insulin aspart 10 mg, D50, calcium gluconate, vancomycin and cefepime.  10/9: Patient overnight had another episode of dyspnea with flushed face and palpitations.  Found to have supraventricular tachycardia requiring IV diltiazem push.  Troponin increased to above 10,000, peaked at 14,207>>13,306.  Potassium at 6.1, BUN 17, creatinine 10.41, it yesterday 144, ALT 77 and anion gap of 16.  BNP >4500, D-dimer 2.66>>3.10. EKG with SVT, peaked T in anteroseptal and ST depression in lateral leads. Patient was started on heparin infusion [place] on-call, cardiology and nephrology was consulted.  Going for dialysis this morning.   Assessment and Plan: * NSTEMI (non-ST elevated myocardial infarction) ([place]) Significant increase in troponin, which peaked above 14,000, makes her in the range of NSTEMI.  EKG with ST depression in lateral leads along with  SVT. Cardiology was consulted by night on-call provider and she was started on heparin infusion. BNP markedly elevated above 4500 Dr. Laurelyn Sickle PA saw her, apparently refused cardiac cath. Echocardiogram pending. -Continue with heparin infusion -Trend troponin -Plavix was added by cardiology  Pulmonary edema - Presumed secondary to SVT - Strict I's and O's, nephrology consulted -Getting her dialysis  Volume overload - Presumed secondary to SVT - Nephrology has been consulted for dialysis - Continue with oxygen-wean as tolerated  Hyperkalemia Slight worsening of calcium despite getting the treatment in ED, potassium at 6.1 this morning. - Status post calcium gluconate 1 g IV, Lokelma 10 g one-time dose, insulin aspart 10 units, D50 1 amp - Getting her dialysis today -Continue to monitor  End stage renal disease ([place]) - Via left upper extremity AV fistula - Monday, Wednesday, 2022-12-02 - last hemodialysis session was Saturday, she missed her routine dialysis on 02-Dec-2022 due to diet of a close friend.  she received full treatment on Saturday - Nephrology consulted, and she is undergoing dialysis today  Essential hypertension - Patient takes Coreg 6.25 mg twice daily, lisinopril 40 mg daily - Coreg has been resumed for 06/24/2022 -Keep holding lisinopril as she was getting Cardizem for rate control. -As needed hydralazine  Anemia of chronic renal failure, stage 5 (HCC) Seems stable and at baseline. -Continue to monitor   Subjective: Patient was seen and examined today.  Continues to have some shortness of breath, she was started on dialysis in ED room.  Denies any chest pain, nausea or vomiting.  She was tearful as her best friend died on 2022/12/02.  Physical Exam: Vitals:   06/24/22 1145 06/24/22 1200 06/24/22 1215 06/24/22  1230  BP: 111/77 132/76 (!) 140/119 125/85  Pulse: 66 66 75 72  Resp: $Remo'16 18 18 15  'NUDMT$ Temp:      TempSrc:      SpO2: 100% 100% 100% 99%  Weight:      Height:        General.  Obese elderly lady, in no acute distress. Pulmonary.decreased breath sounds at bases bilaterally, normal respiratory effort. CV.  Tachycardia with regular rhythm,  Abdomen.  Soft, nontender, nondistended, BS positive. CNS.  Alert and oriented .  No focal neurologic deficit. Extremities.  No edema, no cyanosis, pulses intact and symmetrical. Psychiatry.  Judgment and insight appears normal.  Data Reviewed: Prior data reviewed  Family Communication: Discussed with niece [name] on phone.  She will talk with her as niece would like her to get cardiac cath.  Disposition: Status is: Inpatient Remains inpatient appropriate because: Severity of illness   Planned Discharge Destination: Home with Home Health  DVT prophylaxis.  Heparin infusion Time spent: 50 minutes  This record has been created using Systems analyst. Errors have been sought and corrected,but may not always be located. Such creation errors do not reflect on the standard of care.  Author: Lorella Nimrod, MD 06/24/2022 1:12 PM  For on call review [email or web address].

## 2022-06-24 NOTE — Progress Notes (Signed)
   06/24/22 1330  Vitals  Temp (!) 97.5 F (36.4 C)  Temp Source Oral  BP 120/78  MAP (mmHg) 92  BP Location Right Arm  BP Method Automatic  Patient Position (if appropriate) Lying  Pulse Rate 69  Pulse Rate Source Monitor  ECG Heart Rate 70  Resp 15  Oxygen Therapy  SpO2 98 %  O2 Device Nasal Cannula  O2 Flow Rate (L/min) 2 L/min  Patient Activity (if Appropriate) In bed  Pulse Oximetry Type Continuous  During Treatment Monitoring  Blood Flow Rate (mL/min) 200 mL/min  HD Safety Checks Performed Yes  Intra-Hemodialysis Comments Tx completed  Dialysis Fluid Bolus Normal Saline  Post Treatment  Dialyzer Clearance Clear  Duration of HD Treatment -hour(s) 3.5 hour(s)  Hemodialysis Intake (mL) 0 mL  Liters Processed 84  Fluid Removed 2500 mL  Tolerated HD Treatment Yes  Post-Hemodialysis Comments hd completed. no complications.  AVG/AVF Arterial Site Held (minutes) 10 minutes  AVG/AVF Venous Site Held (minutes) 10 minutes  Fistula / Graft Left Upper arm  No placement date or time found.   Placed prior to admission: Yes  Orientation: Left  Access Location: Upper arm  Site Condition No complications  Fistula / Graft Assessment Present;Thrill;Bruit  Status Deaccessed  Needle Size 15  Drainage Description None

## 2022-06-24 NOTE — Progress Notes (Signed)
This RN called that rapid response was being called for this patient. She had ambulated to bathroom and when she got to bed had a near syncope episode. Rapid called. Vitals stable and patient stabilized with no intervention. BG wnl. EKG obtained. Neomia Glass NP at bedside to assess patient. New orders place. Will continue to monitor.

## 2022-06-24 NOTE — Progress Notes (Signed)
ANTICOAGULATION CONSULT NOTE  Pharmacy Consult for heparin infusion Indication: ACS/STEMI  No Known Allergies  Patient Measurements: Height: 5\' 2"  (157.5 cm) Weight: 84.6 kg (186 lb 8.2 oz) IBW/kg (Calculated) : 50.1 Heparin Dosing Weight: 68.5 kg  Vital Signs: Temp: 97.9 F (36.6 C) (10/09 0946) Temp Source: Oral (10/09 0946) BP: 101/71 (10/09 1045) Pulse Rate: 66 (10/09 1045)  Labs: Recent Labs    06/26/2022 1615 07/05/2022 1846 06/22/2022 2350 06/24/22 0005 06/24/22 0537 06/24/22 0953  HGB 11.8*  --  12.1  --  11.6*  --   HCT 38.5  --  38.6  --  36.5  --   PLT 269  --  232  --  235  --   APTT  --   --  23*  --   --   --   LABPROT  --   --  15.6*  --   --   --   INR  --   --  1.3*  --   --   --   HEPARINUNFRC  --   --   --   --   --  0.35  CREATININE 9.33*  --  10.13*  --  10.41* 10.67*  TROPONINIHS 89* 10,834* 14,207* 13,306*  --   --      Estimated Creatinine Clearance: 4.7 mL/min (A) (by C-G formula based on SCr of 10.67 mg/dL (H)).   Medical History: Past Medical History:  Diagnosis Date   Anemia    Arthritis    Chronic kidney disease    Chronic Kidney Disease   Hypercholesterolemia    Hypertension     Assessment: Pt is a 76 yo female presenting to ED w/ SOB found with elevated troponin I level, trending up  Goal of Therapy:  Heparin level 0.3-0.7 units/ml Monitor platelets by anticoagulation protocol: Yes  Date Time HL Rate/Comment 10/9 0953 0.35 Therapeutic x1  Plan:  Continue heparin infusion at 850 units/hr Check anti-Xa level in 8 hours and daily once consecutively therapeutic. Continue to monitor H&H and platelets daily while on heparin gtt.'  [place] Pharmacist 06/24/2022 10:47 AM

## 2022-06-24 NOTE — Consult Note (Signed)
Kathleen Sharp is a 76 y.o. female  [id number]  Primary Cardiologist: Adrian Blackwater, MD Reason for Consultation: elevated troponins  HPI: Patient is a 76 year old female with a history of ESRD on dialysis MWF, hypertension, cardiomegaly, hyperlipidemia, who presented to the ED on 06/27/2022 with shortness of breath, palpitations. Denies chest pain.   Review of Systems: shortness of breath, palpitations. Denies chest pain.    Past Medical History:  Diagnosis Date   Anemia    Arthritis    Chronic kidney disease    Chronic Kidney Disease   Hypercholesterolemia    Hypertension     (Not in a hospital admission)     atorvastatin  20 mg Oral q1800   carvedilol  6.25 mg Oral BID WC   Chlorhexidine Gluconate Cloth  6 each Topical Q0600   clopidogrel  75 mg Oral Daily   sevelamer carbonate  1,600 mg Oral TID WC   sodium zirconium cyclosilicate  10 g Oral Daily    Infusions:  anticoagulant sodium citrate     diltiazem (CARDIZEM) infusion 5 mg/hr (06/24/22 0143)   heparin 850 Units/hr (06/24/22 0036)    No Known Allergies  Social History   Socioeconomic History   Marital status: Widowed    Spouse name: Not on file   Number of children: Not on file   Years of education: Not on file   Highest education level: Not on file  Occupational History   Not on file  Tobacco Use   Smoking status: Former    Packs/day: 1.00    Years: 15.00    Total pack years: 15.00    Types: Cigarettes    Quit date: 09/16/2012    Years since quitting: 9.7   Smokeless tobacco: Never  Substance and Sexual Activity   Alcohol use: No    Alcohol/week: 0.0 standard drinks of alcohol   Drug use: No   Sexual activity: Not Currently  Other Topics Concern   Not on file  Social History Narrative   Not on file   Social Determinants of Health   Financial Resource Strain: Not on file  Food Insecurity: Not on file  Transportation Needs: Not on file  Physical Activity: Not on file  Stress: Not on  file  Social Connections: Not on file  Intimate Partner Violence: Not on file    Family History  Problem Relation Age of Onset   Kidney disease Father    Heart disease Mother    Kidney disease Brother     PHYSICAL EXAM: Vitals:   06/24/22 0900 06/24/22 0925  BP: 102/84 (!) 123/92  Pulse: (!) 25 100  Resp: (!) 22 19  Temp:    SpO2:  98%     Intake/Output Summary (Last 24 hours) at 06/24/2022 0947 Last data filed at 06/24/2022 0913 Gross per 24 hour  Intake 401.35 ml  Output --  Net 401.35 ml    General:  Well appearing. No respiratory difficulty HEENT: normal Neck: supple. no JVD. Carotids 2+ bilat; no bruits. No lymphadenopathy or thryomegaly appreciated. Cor: PMI nondisplaced. Regular rate & rhythm. No rubs, gallops or murmurs. Lungs: clear Abdomen: soft, nontender, nondistended. No hepatosplenomegaly. No bruits or masses. Good bowel sounds. Extremities: no cyanosis, clubbing, rash, edema Neuro: alert & oriented x 3, cranial nerves grossly intact. moves all 4 extremities w/o difficulty. Affect pleasant.  ECG: SVT, HR 170 bpm  Results for orders placed or performed during the hospital encounter of 07/09/2022 (from the past 24 hour(s))  CBC with Differential     Status: Abnormal   Collection Time: 07/04/2022  4:15 PM  Result Value Ref Range   WBC 5.9 4.0 - 10.5 K/uL   RBC 4.67 3.87 - 5.11 MIL/uL   Hemoglobin 11.8 (L) 12.0 - 15.0 g/dL   HCT 38.5 36.0 - 46.0 %   MCV 82.4 80.0 - 100.0 fL   MCH 25.3 (L) 26.0 - 34.0 pg   MCHC 30.6 30.0 - 36.0 g/dL   RDW 15.9 (H) 11.5 - 15.5 %   Platelets 269 150 - 400 K/uL   nRBC 0.0 0.0 - 0.2 %   Neutrophils Relative % 90 %   Neutro Abs 5.2 1.7 - 7.7 K/uL   Lymphocytes Relative 9 %   Lymphs Abs 0.5 (L) 0.7 - 4.0 K/uL   Monocytes Relative 1 %   Monocytes Absolute 0.1 0.1 - 1.0 K/uL   Eosinophils Relative 0 %   Eosinophils Absolute 0.0 0.0 - 0.5 K/uL   Basophils Relative 0 %   Basophils Absolute 0.0 0.0 - 0.1 K/uL   Immature  Granulocytes 0 %   Abs Immature Granulocytes 0.02 0.00 - 0.07 K/uL  Basic metabolic panel     Status: Abnormal   Collection Time: 07/02/2022  4:15 PM  Result Value Ref Range   Sodium 135 135 - 145 mmol/L   Potassium 5.9 (H) 3.5 - 5.1 mmol/L   Chloride 97 (L) 98 - 111 mmol/L   CO2 20 (L) 22 - 32 mmol/L   Glucose, Bld 172 (H) 70 - 99 mg/dL   BUN 63 (H) 8 - 23 mg/dL   Creatinine, Ser 9.33 (H) 0.44 - 1.00 mg/dL   Calcium 8.3 (L) 8.9 - 10.3 mg/dL   GFR, Estimated 4 (L) >60 mL/min   Anion gap 18 (H) 5 - 15  Lactic acid, plasma     Status: Abnormal   Collection Time: 06/22/2022  4:15 PM  Result Value Ref Range   Lactic Acid, Venous 4.1 (HH) 0.5 - 1.9 mmol/L  Troponin I (High Sensitivity)     Status: Abnormal   Collection Time: 07/14/2022  4:15 PM  Result Value Ref Range   Troponin I (High Sensitivity) 89 (H) <18 ng/L  Resp Panel by RT-PCR (Flu A&B, Covid) Anterior Nasal Swab     Status: None   Collection Time: 07/12/2022  5:13 PM   Specimen: Anterior Nasal Swab  Result Value Ref Range   SARS Coronavirus 2 by RT PCR NEGATIVE NEGATIVE   Influenza A by PCR NEGATIVE NEGATIVE   Influenza B by PCR NEGATIVE NEGATIVE  Hepatitis B surface antigen     Status: None   Collection Time: 06/16/2022  6:46 PM  Result Value Ref Range   Hepatitis B Surface Ag NON REACTIVE NON REACTIVE  Hepatitis B surface antibody     Status: Abnormal   Collection Time: 07/02/2022  6:46 PM  Result Value Ref Range   Hep B S Ab Reactive (A) NON REACTIVE  Hepatitis B core antibody, total     Status: None   Collection Time: 07/09/2022  6:46 PM  Result Value Ref Range   Hep B Core Total Ab NON REACTIVE NON REACTIVE  Hepatitis C antibody     Status: None   Collection Time: 06/29/2022  6:46 PM  Result Value Ref Range   HCV Ab NON REACTIVE NON REACTIVE  Troponin I (High Sensitivity)     Status: Abnormal   Collection Time: 07/11/2022  6:46 PM  Result Value Ref  Range   Troponin I (High Sensitivity) 10,834 (HH) <18 ng/L  Lactic acid,  plasma     Status: Abnormal   Collection Time: 07/15/2022  6:58 PM  Result Value Ref Range   Lactic Acid, Venous 2.6 (HH) 0.5 - 1.9 mmol/L  Basic metabolic panel     Status: Abnormal   Collection Time: 06/17/2022 11:50 PM  Result Value Ref Range   Sodium 137 135 - 145 mmol/L   Potassium 6.1 (H) 3.5 - 5.1 mmol/L   Chloride 99 98 - 111 mmol/L   CO2 21 (L) 22 - 32 mmol/L   Glucose, Bld 148 (H) 70 - 99 mg/dL   BUN 72 (H) 8 - 23 mg/dL   Creatinine, Ser 10.13 (H) 0.44 - 1.00 mg/dL   Calcium 8.5 (L) 8.9 - 10.3 mg/dL   GFR, Estimated 4 (L) >60 mL/min   Anion gap 17 (H) 5 - 15  CBC     Status: Abnormal   Collection Time: 06/16/2022 11:50 PM  Result Value Ref Range   WBC 7.5 4.0 - 10.5 K/uL   RBC 4.89 3.87 - 5.11 MIL/uL   Hemoglobin 12.1 12.0 - 15.0 g/dL   HCT 38.6 36.0 - 46.0 %   MCV 78.9 (L) 80.0 - 100.0 fL   MCH 24.7 (L) 26.0 - 34.0 pg   MCHC 31.3 30.0 - 36.0 g/dL   RDW 15.8 (H) 11.5 - 15.5 %   Platelets 232 150 - 400 K/uL   nRBC 0.0 0.0 - 0.2 %  Troponin I (High Sensitivity)     Status: Abnormal   Collection Time: 06/22/2022 11:50 PM  Result Value Ref Range   Troponin I (High Sensitivity) 14,207 (HH) <18 ng/L  APTT     Status: Abnormal   Collection Time: 06/24/2022 11:50 PM  Result Value Ref Range   aPTT 23 (L) 24 - 36 seconds  Protime-INR     Status: Abnormal   Collection Time: 06/17/2022 11:50 PM  Result Value Ref Range   Prothrombin Time 15.6 (H) 11.4 - 15.2 seconds   INR 1.3 (H) 0.8 - 1.2  Brain natriuretic peptide     Status: Abnormal   Collection Time: 07/10/2022 11:50 PM  Result Value Ref Range   B Natriuretic Peptide >4,500.0 (H) 0.0 - 100.0 pg/mL  D-dimer, quantitative     Status: Abnormal   Collection Time: 07/04/2022 11:50 PM  Result Value Ref Range   D-Dimer, Quant 2.66 (H) 0.00 - 0.50 ug/mL-FEU  Troponin I (High Sensitivity)     Status: Abnormal   Collection Time: 06/24/22 12:05 AM  Result Value Ref Range   Troponin I (High Sensitivity) 13,306 (HH) <18 ng/L  D-dimer,  quantitative     Status: Abnormal   Collection Time: 06/24/22  1:48 AM  Result Value Ref Range   D-Dimer, Quant 3.10 (H) 0.00 - 0.50 ug/mL-FEU  Brain natriuretic peptide     Status: Abnormal   Collection Time: 06/24/22  1:48 AM  Result Value Ref Range   B Natriuretic Peptide >4,500.0 (H) 0.0 - 100.0 pg/mL  CBC     Status: Abnormal   Collection Time: 06/24/22  5:37 AM  Result Value Ref Range   WBC 13.2 (H) 4.0 - 10.5 K/uL   RBC 4.60 3.87 - 5.11 MIL/uL   Hemoglobin 11.6 (L) 12.0 - 15.0 g/dL   HCT 36.5 36.0 - 46.0 %   MCV 79.3 (L) 80.0 - 100.0 fL   MCH 25.2 (L) 26.0 - 34.0 pg   MCHC  31.8 30.0 - 36.0 g/dL   RDW 15.7 (H) 11.5 - 15.5 %   Platelets 235 150 - 400 K/uL   nRBC 0.0 0.0 - 0.2 %  Comprehensive metabolic panel     Status: Abnormal   Collection Time: 06/24/22  5:37 AM  Result Value Ref Range   Sodium 139 135 - 145 mmol/L   Potassium 6.1 (H) 3.5 - 5.1 mmol/L   Chloride 100 98 - 111 mmol/L   CO2 23 22 - 32 mmol/L   Glucose, Bld 90 70 - 99 mg/dL   BUN 72 (H) 8 - 23 mg/dL   Creatinine, Ser 10.41 (H) 0.44 - 1.00 mg/dL   Calcium 9.2 8.9 - 10.3 mg/dL   Total Protein 6.9 6.5 - 8.1 g/dL   Albumin 3.6 3.5 - 5.0 g/dL   AST 144 (H) 15 - 41 U/L   ALT 77 (H) 0 - 44 U/L   Alkaline Phosphatase 59 38 - 126 U/L   Total Bilirubin 0.8 0.3 - 1.2 mg/dL   GFR, Estimated 4 (L) >60 mL/min   Anion gap 16 (H) 5 - 15   DG Chest Portable 1 View  Result Date: 07/12/2022 CLINICAL DATA:  Shortness of breath EXAM: PORTABLE CHEST 1 VIEW COMPARISON:  Multiple chest x-rays, most recently August 14, 2020 FINDINGS: Cardiomegaly. Unchanged mediastinal contours including calcified atherosclerosis of the aortic arch. Diffuse bilateral interstitial pulmonary opacities. Moderate left and small right pleural effusions. No large pneumothorax. No acute osseous abnormality. The visualized upper abdomen is unremarkable. IMPRESSION: 1. Moderate pulmonary edema. 2. Moderate left and small right pleural effusions. 3.  Cardiomegaly. Electronically Signed   By: Beryle Flock M.D.   On: 07/16/2022 16:41     ASSESSMENT AND PLAN: Patient with history of ESRD, pulmonary edema, presented to ED complaining of shortness of breath due to volume overload. Denies chest pain. Elevated troponin levels, peaked at 14,207, most recent 13,306. Discussed cardiac catheterization with patient. Patient declined at this time. Will start patient on Plavix. Echo results pending. Will continue to follow.   Engineer, drilling FNP-C

## 2022-06-24 NOTE — Plan of Care (Signed)

## 2022-06-24 NOTE — Assessment & Plan Note (Addendum)
Significant increase in troponin, which peaked above 14,000, makes her in the range of NSTEMI.  EKG with ST depression in lateral leads along with SVT. Cardiology was consulted by night on-call provider and she was started on heparin infusion. BNP markedly elevated above 4500 Dr. Laurelyn Sickle PA saw her, apparently refused cardiac cath. Echocardiogram pending. -Continue with heparin infusion -Trend troponin -Plavix was added by cardiology

## 2022-06-25 ENCOUNTER — Inpatient Hospital Stay: Payer: Medicare HMO

## 2022-06-25 ENCOUNTER — Encounter: Payer: Self-pay | Admitting: Internal Medicine

## 2022-06-25 DIAGNOSIS — Q283 Other malformations of cerebral vessels: Secondary | ICD-10-CM | POA: Diagnosis not present

## 2022-06-25 DIAGNOSIS — I6503 Occlusion and stenosis of bilateral vertebral arteries: Secondary | ICD-10-CM | POA: Diagnosis not present

## 2022-06-25 DIAGNOSIS — I672 Cerebral atherosclerosis: Secondary | ICD-10-CM | POA: Diagnosis not present

## 2022-06-25 DIAGNOSIS — N186 End stage renal disease: Secondary | ICD-10-CM | POA: Diagnosis not present

## 2022-06-25 DIAGNOSIS — D631 Anemia in chronic kidney disease: Secondary | ICD-10-CM | POA: Diagnosis not present

## 2022-06-25 DIAGNOSIS — R0602 Shortness of breath: Secondary | ICD-10-CM | POA: Diagnosis not present

## 2022-06-25 DIAGNOSIS — I6381 Other cerebral infarction due to occlusion or stenosis of small artery: Secondary | ICD-10-CM | POA: Diagnosis not present

## 2022-06-25 DIAGNOSIS — I499 Cardiac arrhythmia, unspecified: Secondary | ICD-10-CM | POA: Diagnosis not present

## 2022-06-25 DIAGNOSIS — E8770 Fluid overload, unspecified: Secondary | ICD-10-CM | POA: Diagnosis not present

## 2022-06-25 DIAGNOSIS — J90 Pleural effusion, not elsewhere classified: Secondary | ICD-10-CM | POA: Diagnosis not present

## 2022-06-25 DIAGNOSIS — R55 Syncope and collapse: Secondary | ICD-10-CM | POA: Diagnosis not present

## 2022-06-25 DIAGNOSIS — I214 Non-ST elevation (NSTEMI) myocardial infarction: Secondary | ICD-10-CM | POA: Diagnosis not present

## 2022-06-25 DIAGNOSIS — M47812 Spondylosis without myelopathy or radiculopathy, cervical region: Secondary | ICD-10-CM | POA: Diagnosis not present

## 2022-06-25 DIAGNOSIS — J323 Chronic sphenoidal sinusitis: Secondary | ICD-10-CM | POA: Diagnosis not present

## 2022-06-25 LAB — HEPARIN LEVEL (UNFRACTIONATED)
Heparin Unfractionated: 0.28 IU/mL — ABNORMAL LOW (ref 0.30–0.70)
Heparin Unfractionated: 0.51 IU/mL (ref 0.30–0.70)
Heparin Unfractionated: 0.49 IU/mL (ref 0.30–0.70)

## 2022-06-25 LAB — GLUCOSE, CAPILLARY: Glucose-Capillary: 84 mg/dL (ref 70–99)

## 2022-06-25 LAB — CBC
HCT: 33.6 % — ABNORMAL LOW (ref 36.0–46.0)
Hemoglobin: 10.6 g/dL — ABNORMAL LOW (ref 12.0–15.0)
MCH: 25.2 pg — ABNORMAL LOW (ref 26.0–34.0)
MCHC: 31.5 g/dL (ref 30.0–36.0)
MCV: 79.8 fL — ABNORMAL LOW (ref 80.0–100.0)
Platelets: 230 10*3/uL (ref 150–400)
RBC: 4.21 MIL/uL (ref 3.87–5.11)
RDW: 15.7 % — ABNORMAL HIGH (ref 11.5–15.5)
WBC: 14.8 10*3/uL — ABNORMAL HIGH (ref 4.0–10.5)
nRBC: 0 % (ref 0.0–0.2)

## 2022-06-25 LAB — BASIC METABOLIC PANEL
Anion gap: 14 (ref 5–15)
BUN: 45 mg/dL — ABNORMAL HIGH (ref 8–23)
CO2: 28 mmol/L (ref 22–32)
Calcium: 8.4 mg/dL — ABNORMAL LOW (ref 8.9–10.3)
Chloride: 93 mmol/L — ABNORMAL LOW (ref 98–111)
Creatinine, Ser: 7.34 mg/dL — ABNORMAL HIGH (ref 0.44–1.00)
GFR, Estimated: 5 mL/min — ABNORMAL LOW (ref >60)
Glucose, Bld: 97 mg/dL (ref 70–99)
Potassium: 4.8 mmol/L (ref 3.5–5.1)
Sodium: 135 mmol/L (ref 135–145)

## 2022-06-25 LAB — TROPONIN I (HIGH SENSITIVITY)
Troponin I (High Sensitivity): 11321 ng/L (ref <18)
Troponin I (High Sensitivity): 12073 ng/L (ref <18)

## 2022-06-25 LAB — HEPATITIS B SURFACE ANTIBODY, QUANTITATIVE: Hep B S AB Quant (Post): 65.1 m[IU]/mL (ref >9.9)

## 2022-06-25 MED ORDER — HEPARIN SODIUM (PORCINE) 1000 UNIT/ML IJ SOLN
1000.0000 [IU] | Freq: Once | INTRAMUSCULAR | Status: AC
  Administered 2022-06-25: 1000 [IU] via INTRAVENOUS
  Filled 2022-06-25: qty 1

## 2022-06-25 MED ORDER — AMIODARONE HCL IN DEXTROSE 360-4.14 MG/200ML-% IV SOLN
30.0000 mg/h | INTRAVENOUS | Status: DC
  Administered 2022-06-25 – 2022-06-30 (×8): 30 mg/h via INTRAVENOUS
  Filled 2022-06-25 (×8): qty 200

## 2022-06-25 MED ORDER — IOHEXOL 350 MG/ML SOLN
75.0000 mL | Freq: Once | INTRAVENOUS | Status: AC | PRN
  Administered 2022-06-25: 75 mL via INTRAVENOUS

## 2022-06-25 MED ORDER — SODIUM CHLORIDE 0.9% FLUSH
3.0000 mL | Freq: Two times a day (BID) | INTRAVENOUS | Status: DC
  Administered 2022-06-25 – 2022-07-07 (×19): 3 mL via INTRAVENOUS

## 2022-06-25 MED ORDER — AMIODARONE HCL IN DEXTROSE 360-4.14 MG/200ML-% IV SOLN
60.0000 mg/h | INTRAVENOUS | Status: AC
  Administered 2022-06-25: 60 mg/h via INTRAVENOUS
  Filled 2022-06-25 (×2): qty 200

## 2022-06-25 MED ORDER — SODIUM CHLORIDE 0.9 % IV BOLUS
500.0000 mL | Freq: Once | INTRAVENOUS | Status: AC
  Administered 2022-06-25: 500 mL via INTRAVENOUS

## 2022-06-25 NOTE — Assessment & Plan Note (Addendum)
-   Patient takes Coreg 6.25 mg twice daily, lisinopril 40 mg daily Blood pressure seems improving with intermittently mildly elevated readings.  Still had couple of softer blood pressure readings. -Keep holding home antihypertensives -Continue to monitor

## 2022-06-25 NOTE — Assessment & Plan Note (Addendum)
Patient received an extra session of dialysis today as she was unable to complete her routine yesterday due to some agitation. - Via left upper extremity AV fistula - Monday, Wednesday, Friday -Nephrology is on board -Continue with routine dialysis

## 2022-06-25 NOTE — Progress Notes (Addendum)
RN to room after family called out concerned that patient was having seizure, patient lethargic, weak, and confused, MD at bedside 12 lead EKG obtained patient with episode of VT then a fib with spontaneous conversion to SR, BP elevated during episode however softer as time progressed (see flowsheet), CBG 84, no complaints of pain, left arm weakness resolved, CT and Chest Xray ordered, patient to start amiodarone gtt.   06/25/22 1111  Vitals  BP (!) 150/133  MAP (mmHg) 141  BP Location Right Arm  BP Method Automatic  Patient Position (if appropriate) Lying  Pulse Rate 71  Pulse Rate Source Monitor  MEWS COLOR  MEWS Score Color Green  Oxygen Therapy  SpO2 95 %  O2 Device Nasal Cannula  O2 Flow Rate (L/min) 2 L/min  MEWS Score  MEWS Temp 0  MEWS Systolic 0  MEWS Pulse 0  MEWS RR 0  MEWS LOC 0  MEWS Score 0

## 2022-06-25 NOTE — Assessment & Plan Note (Signed)
Resolved with dialysis. -Continue to monitor -Patient is high risk for cardiac arrhythmia

## 2022-06-25 NOTE — Progress Notes (Signed)
Patient had an episode of nonsustained ventricular tachycardia followed by atrial fibrillation associated with dizziness spell.  Patient states that she did not pass out.  She denies any chest pain or shortness of breath but dizziness is abating.  Started IV amiodarone and will switch to p.o. amiodarone 400 twice daily tomorrow unless there is a problem of hypotension overnight.  Would like to continue IV amiodarone to prevent these episodes as p.o. amiodarone takes a while to take effect

## 2022-06-25 NOTE — Progress Notes (Signed)
ANTICOAGULATION CONSULT NOTE  Pharmacy Consult for heparin infusion Indication: ACS/STEMI  No Known Allergies  Patient Measurements: Height: 5\' 2"  (157.5 cm) Weight: 82.1 kg (181 lb) IBW/kg (Calculated) : 50.1 Heparin Dosing Weight: 68.5 kg  Vital Signs: Temp: 97.8 F (36.6 C) (10/10 1225) Temp Source: Oral (10/10 0736) BP: 90/63 (10/10 1315) Pulse Rate: 69 (10/10 1315)  Labs: Recent Labs    06/29/2022 2350 06/24/22 0005 06/24/22 0537 06/24/22 0537 06/24/22 0953 06/24/22 1753 06/24/22 2127 06/24/22 2349 06/25/22 0223 06/25/22 0442 06/25/22 1439  HGB 12.1  --  11.6*  --   --   --  10.9*  --   --  10.6*  --   HCT 38.6  --  36.5  --   --   --  34.9*  --   --  33.6*  --   PLT 232  --  235  --   --   --  242  --   --  230  --   APTT 23*  --   --   --   --   --   --   --   --   --   --   LABPROT 15.6*  --   --   --   --   --   --   --   --   --   --   INR 1.3*  --   --   --   --   --   --   --   --   --   --   HEPARINUNFRC  --   --   --    < > 0.35 0.24*  --   --   --  0.28* 0.51  CREATININE 10.13*  --  10.41*  --  10.67*  --  6.68*  --   --  7.34*  --   TROPONINIHS 14,207*   < >  --   --   --   --  11,276* 12,073* 11,321*  --   --    < > = values in this interval not displayed.     Estimated Creatinine Clearance: 6.7 mL/min (A) (by C-G formula based on SCr of 7.34 mg/dL (H)).   Medical History: Past Medical History:  Diagnosis Date   Anemia    Arthritis    Chronic kidney disease    Chronic Kidney Disease   Hypercholesterolemia    Hypertension   Heparin Dosing Weight: 68.5 kg  Assessment: Pt is a 76 yo female presenting to ED w/ SOB found with elevated troponin I level, trending up  Goal of Therapy:  Heparin level 0.3-0.7 units/ml Monitor platelets by anticoagulation protocol: Yes  Date Time HL Rate/Comment 10/9 0953 0.35 Therapeutic x1 10/9 1753 0.24 Subtherapeutic; 850>1000 un/hr 10/10  0442 0.28 Subtherapeutic; 1000 > 1150  un/hr 10/10 1439 0.51 Therapeutic x1  Hgb 12.1>10.9>10.6; Plts 232>230 Trop 14k>13.3k>12.1k>11.3k  Plan:  HL therapeutic x1. Continue heparin infusion at 1,150 units/hr Check anti-Xa level in 8 hours and daily once consecutively therapeutic. Continue to monitor H&H and platelets daily while on heparin gtt.'   [place] Pharmacist 06/25/2022 3:02 PM

## 2022-06-25 NOTE — Progress Notes (Signed)
SUBJECTIVE: Patient is a 76 year old female with a history of ESRD on dialysis MWF, hypertension, cardiomegaly, hyperlipidemia, who presented to the ED on 07/14/2022 with shortness of breath, palpitations. Denies chest pain.    Patient had syncopal episode last night, rapid response called.    Vitals:   06/25/22 0420 06/25/22 0425 06/25/22 0736 06/25/22 0826  BP: 95/75 95/73 (!) 84/65 (!) 80/67  Pulse:   73   Resp:   16   Temp:   98.3 F (36.8 C)   TempSrc:   Oral   SpO2:   100%   Weight:      Height:        Intake/Output Summary (Last 24 hours) at 06/25/2022 0828 Last data filed at 06/24/2022 1817 Gross per 24 hour  Intake 278.65 ml  Output 2500 ml  Net -2221.35 ml    LABS: Basic Metabolic Panel: Recent Labs    06/24/22 0953 06/24/22 1330 06/24/22 2127 06/25/22 0442  NA 137  --  137 135  K 6.2*   < > 4.9 4.8  CL 99  --  90* 93*  CO2 21*  --  24 28  GLUCOSE 72  --  137* 97  BUN 76*  --  39* 45*  CREATININE 10.67*  --  6.68* 7.34*  CALCIUM 8.8*  --  8.6* 8.4*  MG  --   --  1.8  --   PHOS 7.5*  --   --   --    < > = values in this interval not displayed.   Liver Function Tests: Recent Labs    06/24/22 0537 06/24/22 0953 06/24/22 2127  AST 144*  --  144*  ALT 77*  --  118*  ALKPHOS 59  --  56  BILITOT 0.8  --  0.9  PROT 6.9  --  6.7  ALBUMIN 3.6 3.6 3.4*   No results for input(s): "LIPASE", "AMYLASE" in the last 72 hours. CBC: Recent Labs    07/06/2022 1615 07/07/2022 2350 06/24/22 2127 06/25/22 0442  WBC 5.9   < > 12.3* 14.8*  NEUTROABS 5.2  --   --   --   HGB 11.8*   < > 10.9* 10.6*  HCT 38.5   < > 34.9* 33.6*  MCV 82.4   < > 80.0 79.8*  PLT 269   < > 242 230   < > = values in this interval not displayed.   Cardiac Enzymes: No results for input(s): "CKTOTAL", "CKMB", "CKMBINDEX", "TROPONINI" in the last 72 hours. BNP: Invalid input(s): "POCBNP" D-Dimer: Recent Labs    07/16/2022 2350 06/24/22 0148  DDIMER 2.66* 3.10*   Hemoglobin A1C: No  results for input(s): "HGBA1C" in the last 72 hours. Fasting Lipid Panel: No results for input(s): "CHOL", "HDL", "LDLCALC", "TRIG", "CHOLHDL", "LDLDIRECT" in the last 72 hours. Thyroid Function Tests: No results for input(s): "TSH", "T4TOTAL", "T3FREE", "THYROIDAB" in the last 72 hours.  Invalid input(s): "FREET3" Anemia Panel: No results for input(s): "VITAMINB12", "FOLATE", "FERRITIN", "TIBC", "IRON", "RETICCTPCT" in the last 72 hours.   PHYSICAL EXAM General: Well developed, well nourished, in no acute distress HEENT:  Normocephalic and atramatic Neck:  No JVD.  Lungs: Clear bilaterally to auscultation and percussion. Heart: HRRR . Normal S1 and S2 without gallops or murmurs.  Abdomen: Bowel sounds are positive, abdomen soft and non-tender  Msk:  Back normal, normal gait. Normal strength and tone for age. Extremities: No clubbing, cyanosis or edema.   Neuro: Alert and oriented X 3.  Psych:  Good affect, responds appropriately  TELEMETRY: sinus rhythm, HR 74 bpm  ASSESSMENT AND PLAN: Patient resting comfortably in bed. Had syncopal episode last night. EF normal on Echo. Patient has agreed to cardiac catheterization. Risks and benefits of cardiac cath discussed. Will schedule cardiac cath.   Principal Problem:   NSTEMI (non-ST elevated myocardial infarction) [facility]) Active Problems:   Anemia of chronic renal failure, stage 5 (HCC)   End stage renal disease (HCC)   Essential hypertension   Volume overload   Hyperkalemia   Pulmonary edema    Carmesha Morocco, FNP-C 06/25/2022 8:28 AM

## 2022-06-25 NOTE — Assessment & Plan Note (Signed)
-   Presumed secondary to SVT and NSTEMI - Nephrology has been consulted for dialysis - Continue with oxygen-wean as tolerated

## 2022-06-25 NOTE — Assessment & Plan Note (Addendum)
-   Presumed secondary to SVT and NSTEMI - Received her dialysis on admission and continuing routine dialysis at this time. -Intermittently becoming hypoxic and was placed on BiPAP.  Most likely some low perfusion and not picking up saturation very well.  ABG without any hypoxia

## 2022-06-25 NOTE — Progress Notes (Signed)
Pts BP low 77/61 MAP 68/ pt alert - no distress noted/ MD made aware and at bedside/ orders to give 500 NS bolus and start amio gtt after bolus given/ will continue to monitor

## 2022-06-25 NOTE — Assessment & Plan Note (Addendum)
Patient is experiencing transient, self-limiting cardiac arrhythmias where she became symptomatic.  Had 2 syncopal episode during current hospitalization.  Most likely secondary to recent NSTEMI. Cardiology started her on amiodarone infusion, which was discontinued due to symptomatic bradycardia overnight requiring atropine. -Continue with telemetry monitoring

## 2022-06-25 NOTE — Assessment & Plan Note (Addendum)
Some decrease in hemoglobin to 8.6, no obvious bleeding. -Continue to monitor -Transfuse if below 8

## 2022-06-25 NOTE — Progress Notes (Signed)
ANTICOAGULATION CONSULT NOTE  Pharmacy Consult for heparin infusion Indication: ACS/STEMI  No Known Allergies  Patient Measurements: Height: 5\' 2"  (157.5 cm) Weight: 82.1 kg (181 lb) IBW/kg (Calculated) : 50.1 Heparin Dosing Weight: 68.5 kg  Vital Signs: Temp: 98.3 F (36.8 C) (10/10 2007) BP: 87/62 (10/10 2007) Pulse Rate: 66 (10/10 2007)  Labs: Recent Labs    06/28/2022 2350 06/24/22 0005 06/24/22 0537 06/24/22 0953 06/24/22 1753 06/24/22 2127 06/24/22 2349 06/25/22 0223 06/25/22 0442 06/25/22 1439 06/25/22 2219  HGB 12.1  --  11.6*  --   --  10.9*  --   --  10.6*  --   --   HCT 38.6  --  36.5  --   --  34.9*  --   --  33.6*  --   --   PLT 232  --  235  --   --  242  --   --  230  --   --   APTT 23*  --   --   --   --   --   --   --   --   --   --   LABPROT 15.6*  --   --   --   --   --   --   --   --   --   --   INR 1.3*  --   --   --   --   --   --   --   --   --   --   HEPARINUNFRC  --   --   --  0.35   < >  --   --   --  0.28* 0.51 0.49  CREATININE 10.13*  --  10.41* 10.67*  --  6.68*  --   --  7.34*  --   --   TROPONINIHS 14,207*   < >  --   --   --  11,276* 12,073* 11,321*  --   --   --    < > = values in this interval not displayed.     Estimated Creatinine Clearance: 6.7 mL/min (A) (by C-G formula based on SCr of 7.34 mg/dL (H)).   Medical History: Past Medical History:  Diagnosis Date   Anemia    Arthritis    Chronic kidney disease    Chronic Kidney Disease   Hypercholesterolemia    Hypertension   Heparin Dosing Weight: 68.5 kg  Assessment: Pt is a 76 yo female presenting to ED w/ SOB found with elevated troponin I level, trending up  Goal of Therapy:  Heparin level 0.3-0.7 units/ml Monitor platelets by anticoagulation protocol: Yes  Date Time HL Rate/Comment 10/9 0953 0.35 Therapeutic x1 10/9 1753 0.24 Subtherapeutic; 850>1000 un/hr 10/10  0442 0.28 Subtherapeutic; 1000 > 1150 un/hr 10/10 1439 0.51 Therapeutic x1 10/10 2219 0.49 Thera  x2; 1150 un/hr  Hgb 12.1>10.9>10.6; Plts 232>230 Trop 14k>13.3k>12.1k>11.3k  Plan:  HL therapeutic x2. Continue heparin infusion at 1,150 units/hr Check HL daily with AM labs since consecutively therapeutic. Continue to monitor H&H and platelets daily while on heparin gtt.   Lorna Dibble Clinical Pharmacist 06/25/2022 11:08 PM

## 2022-06-25 NOTE — Progress Notes (Signed)
[facility]  ROUNDING NOTE   Subjective:   Kathleen Sharp is a 76 year old female with past medical conditions including hyperlipidemia, hypertension, cardiomegaly, and end-stage renal disease on hemodialysis.  Patient presents to the emergency department with complaints of shortness of breath and has been admitted for Hyperkalemia [E87.5] SOB (shortness of breath) [R06.02] Tachyarrhythmia [R00.0] Volume overload [E87.70]  Patient is known to our practice and receives outpatient dialysis treatments at [facility] on a MWF schedule, supervised by Dr. Candiss Norse.   Patient seen resting quietly, alert and oriented Remains on room air States she originally refused cardiac catheterization but is now agreeable to proceed No lower extremity edema Nursing note reports rapid response for syncopal episode last night.  No injuries reported  Objective:  Vital signs in last 24 hours:  Temp:  [97.5 F (36.4 C)-98.5 F (36.9 C)] 97.8 F (36.6 C) (10/10 1225) Pulse Rate:  [69-88] 69 (10/10 1315) Resp:  [15-20] 16 (10/10 1122) BP: (77-150)/(52-133) 90/63 (10/10 1315) SpO2:  [91 %-100 %] 98 % (10/10 1315) Weight:  [82.1 kg] 82.1 kg (10/09 1343)  Weight change: 2.499 kg Filed Weights   06/20/2022 2141 06/24/22 0930 06/24/22 1343  Weight: 82.1 kg 84.6 kg 82.1 kg    Intake/Output: I/O last 3 completed shifts: In: 628.7 [P.O.:240; I.V.:38.7; IV Piggyback:350] Out: 2500 [Other:2500]   Intake/Output this shift:  No intake/output data recorded.  Physical Exam: General: NAD, resting comfortably  Head: Normocephalic, atraumatic. Moist oral mucosal membranes  Eyes: Anicteric  Lungs:  Diminished in bases, normal effort, room air  Heart: Regular rate and rhythm  Abdomen:  Soft, nontender, obese  Extremities: No peripheral edema.  Neurologic: Nonfocal, moving all four extremities  Skin: No lesions  Access: Left AVG    Basic Metabolic Panel: Recent Labs  Lab  07/15/2022 2350 06/24/22 0537 06/24/22 0953 06/24/22 1330 06/24/22 2127 06/25/22 0442  NA 137 139 137  --  137 135  K 6.1* 6.1* 6.2* 3.0* 4.9 4.8  CL 99 100 99  --  90* 93*  CO2 21* 23 21*  --  24 28  GLUCOSE 148* 90 72  --  137* 97  BUN 72* 72* 76*  --  39* 45*  CREATININE 10.13* 10.41* 10.67*  --  6.68* 7.34*  CALCIUM 8.5* 9.2 8.8*  --  8.6* 8.4*  MG  --   --   --   --  1.8  --   PHOS  --   --  7.5*  --   --   --      Liver Function Tests: Recent Labs  Lab 06/24/22 0537 06/24/22 0953 06/24/22 2127  AST 144*  --  144*  ALT 77*  --  118*  ALKPHOS 59  --  56  BILITOT 0.8  --  0.9  PROT 6.9  --  6.7  ALBUMIN 3.6 3.6 3.4*    No results for input(s): "LIPASE", "AMYLASE" in the last 168 hours. No results for input(s): "AMMONIA" in the last 168 hours.  CBC: Recent Labs  Lab 06/26/2022 1615 07/07/2022 2350 06/24/22 0537 06/24/22 2127 06/25/22 0442  WBC 5.9 7.5 13.2* 12.3* 14.8*  NEUTROABS 5.2  --   --   --   --   HGB 11.8* 12.1 11.6* 10.9* 10.6*  HCT 38.5 38.6 36.5 34.9* 33.6*  MCV 82.4 78.9* 79.3* 80.0 79.8*  PLT 269 232 235 242 230     Cardiac Enzymes: No results for input(s): "CKTOTAL", "CKMB", "CKMBINDEX", "TROPONINI" in the  last 168 hours.  BNP: Invalid input(s): "POCBNP"  CBG: Recent Labs  Lab 06/24/22 2053 06/25/22 1103  GLUCAP 91 84    Microbiology: Results for orders placed or performed during the hospital encounter of 06/26/2022  Resp Panel by RT-PCR (Flu A&B, Covid) Anterior Nasal Swab     Status: None   Collection Time: 06/21/2022  5:13 PM   Specimen: Anterior Nasal Swab  Result Value Ref Range Status   SARS Coronavirus 2 by RT PCR NEGATIVE NEGATIVE Final    Comment: (NOTE) SARS-CoV-2 target nucleic acids are NOT DETECTED.  The SARS-CoV-2 RNA is generally detectable in upper respiratory specimens during the acute phase of infection. The lowest concentration of SARS-CoV-2 viral copies this assay can detect is 138 copies/mL. A negative result  does not preclude SARS-Cov-2 infection and should not be used as the sole basis for treatment or other patient management decisions. A negative result may occur with  improper specimen collection/handling, submission of specimen other than nasopharyngeal swab, presence of viral mutation(s) within the areas targeted by this assay, and inadequate number of viral copies(<138 copies/mL). A negative result must be combined with clinical observations, patient history, and epidemiological information. The expected result is Negative.  Fact Sheet for Patients:  EntrepreneurPulse.com.au  Fact Sheet for Healthcare Providers:  IncredibleEmployment.be  This test is no t yet approved or cleared by the [place] FDA and  has been authorized for detection and/or diagnosis of SARS-CoV-2 by FDA under an Emergency Use Authorization (EUA). This EUA will remain  in effect (meaning this test can be used) for the duration of the COVID-19 declaration under Section 564(b)(1) of the Act, 21 U.S.C.section 360bbb-3(b)(1), unless the authorization is terminated  or revoked sooner.       Influenza A by PCR NEGATIVE NEGATIVE Final   Influenza B by PCR NEGATIVE NEGATIVE Final    Comment: (NOTE) The Xpert Xpress SARS-CoV-2/FLU/RSV plus assay is intended as an aid in the diagnosis of influenza from Nasopharyngeal swab specimens and should not be used as a sole basis for treatment. Nasal washings and aspirates are unacceptable for Xpert Xpress SARS-CoV-2/FLU/RSV testing.  Fact Sheet for Patients: EntrepreneurPulse.com.au  Fact Sheet for Healthcare Providers: IncredibleEmployment.be  This test is not yet approved or cleared by the [place] FDA and has been authorized for detection and/or diagnosis of SARS-CoV-2 by FDA under an Emergency Use Authorization (EUA). This EUA will remain in effect (meaning this test can be used) for  the duration of the COVID-19 declaration under Section 564(b)(1) of the Act, 21 U.S.C. section 360bbb-3(b)(1), unless the authorization is terminated or revoked.  Performed at [facility], [address] [place], [place] [address]   MRSA Next Gen by PCR, Nasal     Status: None   Collection Time: 06/24/22  4:49 PM   Specimen: Nasal Mucosa; Nasal Swab  Result Value Ref Range Status   MRSA by PCR Next Gen NOT DETECTED NOT DETECTED Final    Comment: (NOTE) The GeneXpert MRSA Assay (FDA approved for NASAL specimens only), is one component of a comprehensive MRSA colonization surveillance program. It is not intended to diagnose MRSA infection nor to guide or monitor treatment for MRSA infections. Test performance is not FDA approved in patients less than 74 years old. Performed at [facility], [address] [place], [place] [address]     Coagulation Studies: Recent Labs    06/16/2022 2350  LABPROT 15.6*  INR 1.3*     Urinalysis: No results for  input(s): "COLORURINE", "LABSPEC", "PHURINE", "GLUCOSEU", "HGBUR", "BILIRUBINUR", "KETONESUR", "PROTEINUR", "UROBILINOGEN", "NITRITE", "LEUKOCYTESUR" in the last 72 hours.  Invalid input(s): "APPERANCEUR"    Imaging: CT ANGIO HEAD NECK W WO CM  Result Date: 06/25/2022 CLINICAL DATA:  Provided history: Change in mental status. Additional history provided: sncopal episode last night. EXAM: CT ANGIOGRAPHY HEAD AND NECK TECHNIQUE: Multidetector CT imaging of the head and neck was performed using the standard protocol during bolus administration of intravenous contrast. Multiplanar CT image reconstructions and MIPs were obtained to evaluate the vascular anatomy. Carotid stenosis measurements (when applicable) are obtained utilizing NASCET criteria, using the distal internal carotid diameter as the denominator. RADIATION DOSE REDUCTION: This exam was performed according to the departmental dose-optimization program which  includes automated exposure control, adjustment of the mA and/or kV according to patient size and/or use of iterative reconstruction technique. CONTRAST:  79mL OMNIPAQUE IOHEXOL 350 MG/ML SOLN COMPARISON:  No pertinent prior exams available for comparison. FINDINGS: CT HEAD FINDINGS Brain: Mild generalized cerebral atrophy. Moderate patchy and ill-defined hypoattenuation within the cerebral white matter, nonspecific but compatible with chronic small vessel disease. Small chronic lacunar infarct within the left caudate head. There is no acute intracranial hemorrhage. No demarcated cortical infarct. No extra-axial fluid collection. No evidence of an intracranial mass. No midline shift. Vascular: No hyperdense vessel. Atherosclerotic calcifications. Skull: No fracture or aggressive osseous lesion. Sinuses/Orbits: No mass or acute finding within the imaged orbits. Fluid level, and background mild mucosal thickening, within the left sphenoid sinus. Review of the MIP images confirms the above findings CTA NECK FINDINGS Aortic arch: Common origin of the innominate left common carotid arteries. Atherosclerotic plaque within the visualized aortic arch and proximal major branch vessels of the neck. Streak and beam hardening artifact arising from a dense right-sided contrast bolus partially obscures the right subclavian artery. Within this limitation, there is no appreciable hemodynamically significant innominate or proximal subclavian artery stenosis. Right carotid system: CCA and ICA patent within the neck without hemodynamically significant stenosis (50% or greater). Atherosclerotic plaque at the CCA origin, within the proximal CCA, about the carotid bifurcation and within the proximal ICA. Left carotid system: CCA and ICA patent within the neck without hemodynamically significant stenosis (50% or greater). Atherosclerotic plaque at the CCA origin, about the carotid bifurcation and within the proximal ICA. Vertebral  arteries: The dominant right vertebral artery is patent within the neck. Mild atherosclerotic narrowing at the origin of this vessel. The non dominant left vertebral artery is developmentally diminutive, but patent throughout the neck. Skeleton: Cervical spondylosis. Facet joint ankylosis on the right at C4-C5. No acute fracture or aggressive osseous lesion. Other neck: Calcified nodules within the bilateral thyroid lobes, measuring up to 13 mm, not meeting consensus criteria for ultrasound follow-up based on size. No follow-up imaging is recommended. Thyroid reference no cervical lymphadenopathy. Upper chest: No consolidation within the imaged lung apices. Emphysema. Review of the MIP images confirms the above findings CTA HEAD FINDINGS Anterior circulation: The intracranial internal carotid arteries are patent. Atherosclerotic plaque within both vessels. Mild to moderate stenosis within the right paraclinoid segment. No more than mild atherosclerotic narrowing of the intracranial left ICA The M1 middle cerebral arteries are patent. No M2 proximal branch occlusion or high-grade proximal stenosis. The anterior cerebral arteries are patent. No intracranial aneurysm is identified. Posterior circulation: The dominant cranial right vertebral artery is patent. Nonstenotic atherosclerotic plaque within this vessel. Severe stenosis within the proximal left V4 segment. The basilar artery is patent. The posterior cerebral arteries  are patent. Hypoplastic P1 segment with sizable posterior communicating arteries, bilaterally. Venous sinuses: Within the limitations of contrast timing, no convincing thrombus. Anatomic variants: As described Review of the MIP images confirms the above findings IMPRESSION: CT head: 1. No evidence of acute intracranial abnormality. 2. Moderate chronic small ischemic disease within the cerebral white matter. 3. Small chronic lacunar infarct within the left caudate head. 4. Mild generalized cerebral  atrophy. 5. Left sphenoid sinusitis. CTA neck: 1. The common carotid and internal carotid arteries are patent within the neck without hemodynamically significant stenosis. Atherosclerotic plaque, bilaterally. 2. Vertebral arteries patent within the neck. Mild atherosclerotic narrowing at the origin of the dominant right vertebral artery. 3. Aortic Atherosclerosis (ICD10-I70.0) and Emphysema (ICD10-J43.9). CTA head: 1. No intracranial large vessel occlusion is identified. 2. Intracranial atherosclerotic disease with multifocal stenoses, most notably as follows. 3. Severe stenosis within the left vertebral artery proximal V4 segment. 4. Mild-to-moderate stenosis within the paraclinoid right ICA. Electronically Signed   By: Kellie Simmering D.O.   On: 06/25/2022 12:39   DG Chest Port 1 View  Result Date: 06/25/2022 CLINICAL DATA:  Shortness of breath EXAM: PORTABLE CHEST 1 VIEW COMPARISON:  Chest x-ray June 23, 2022 FINDINGS: Unchanged cardiomediastinal contours including cardiomegaly. Slightly improved aeration of bibasilar lungs. Persistent bilateral reticular and interstitial pulmonary opacities. Probable small bilateral pleural effusions. No large pneumothorax. The visualized upper abdomen is unremarkable. No acute osseous abnormality. IMPRESSION: 1. Slightly improved aeration of bilateral lungs with persistent interstitial pulmonary opacities, most consistent with pulmonary edema. 2. Small bilateral pleural effusions. 3. Cardiomegaly. Electronically Signed   By: Beryle Flock M.D.   On: 06/25/2022 11:42   ECHOCARDIOGRAM COMPLETE BUBBLE STUDY  Result Date: 06/24/2022    ECHOCARDIOGRAM REPORT   Patient Name:   Kathleen Sharp Date of Exam: 06/24/2022 Medical Rec #:  [id number]       Height:       62.0 in Accession #:    [id number]      Weight:       186.5 lb Date of Birth:  [birth year]        BSA:          1.856 m Patient Age:    55 years        BP:           125/85 mmHg Patient Gender: F               HR:            72 bpm. Exam Location:  [facility] Procedure: 2D Echo, Cardiac Doppler, Color Doppler and Saline Contrast Bubble            Study Indications:     Syncope 780.2 / R55  History:         Patient has no prior history of Echocardiogram examinations.                  Risk Factors:Hypertension. CKD.  Sonographer:     Sherrie Sport Referring Phys:  [id number] AMY N COX Diagnosing Phys: Yolonda Kida MD  Sonographer Comments: Technically challenging study due to limited acoustic windows and no apical window. IMPRESSIONS  1. Negative bubble study.  2. Left ventricular ejection fraction, by estimation, is 55 to 60%. The left ventricle has normal function. The left ventricle has no regional wall motion abnormalities. There is moderate concentric left ventricular hypertrophy. Left ventricular diastolic function could not be evaluated.  3. Right ventricular systolic function is normal. The  right ventricular size is normal.  4. The mitral valve is normal in structure. Trivial mitral valve regurgitation.  5. The aortic valve is calcified. There is severe calcifcation of the aortic valve. There is severe thickening of the aortic valve. Aortic valve regurgitation is mild. Aortic valve sclerosis/calcification is present, without any evidence of aortic stenosis. FINDINGS  Left Ventricle: Left ventricular ejection fraction, by estimation, is 55 to 60%. The left ventricle has normal function. The left ventricle has no regional wall motion abnormalities. The left ventricular internal cavity size was normal in size. There is  moderate concentric left ventricular hypertrophy. Left ventricular diastolic function could not be evaluated. Right Ventricle: The right ventricular size is normal. No increase in right ventricular wall thickness. Right ventricular systolic function is normal. Left Atrium: Left atrial size was normal in size. Right Atrium: Right atrial size was normal in size. Pericardium: There is no evidence of pericardial effusion.  Mitral Valve: The mitral valve is normal in structure. There is mild thickening of the mitral valve leaflet(s). There is mild calcification of the mitral valve leaflet(s). Normal mobility of the mitral valve leaflets. Trivial mitral valve regurgitation. Tricuspid Valve: The tricuspid valve is grossly normal. Tricuspid valve regurgitation is mild. Aortic Valve: The aortic valve is calcified. There is severe calcifcation of the aortic valve. There is severe thickening of the aortic valve. There is moderate to severe aortic valve annular calcification. Aortic valve regurgitation is mild. Aortic valve sclerosis/calcification is present, without any evidence of aortic stenosis. Pulmonic Valve: The pulmonic valve was normal in structure. Pulmonic valve regurgitation is not visualized. Aorta: The ascending aorta was not well visualized. IAS/Shunts: No atrial level shunt detected by color flow Doppler. Agitated saline contrast was given intravenously to evaluate for intracardiac shunting. Additional Comments: Negative bubble study.  LEFT VENTRICLE PLAX 2D LVIDd:         4.20 cm LVIDs:         3.00 cm LV PW:         1.50 cm LV IVS:        1.70 cm LVOT diam:     2.00 cm LVOT Area:     3.14 cm  LEFT ATRIUM         Index LA diam:    3.60 cm 1.94 cm/m                        PULMONIC VALVE AORTA                 PR End Diast Vel: 9.73 msec Ao Root diam: 2.70 cm  TRICUSPID VALVE TR Peak grad:   42.8 mmHg TR Vmax:        327.00 cm/s  SHUNTS Systemic Diam: 2.00 cm Yolonda Kida MD Electronically signed by Yolonda Kida MD Signature Date/Time: 06/24/2022/8:13:11 PM    Final    DG Chest Portable 1 View  Result Date: 06/17/2022 CLINICAL DATA:  Shortness of breath EXAM: PORTABLE CHEST 1 VIEW COMPARISON:  Multiple chest x-rays, most recently August 14, 2020 FINDINGS: Cardiomegaly. Unchanged mediastinal contours including calcified atherosclerosis of the aortic arch. Diffuse bilateral interstitial pulmonary opacities.  Moderate left and small right pleural effusions. No large pneumothorax. No acute osseous abnormality. The visualized upper abdomen is unremarkable. IMPRESSION: 1. Moderate pulmonary edema. 2. Moderate left and small right pleural effusions. 3. Cardiomegaly. Electronically Signed   By: Beryle Flock M.D.   On: 06/29/2022 16:41     Medications:  amiodarone 60 mg/hr (06/25/22 1258)   Followed by   amiodarone     anticoagulant sodium citrate     diltiazem (CARDIZEM) infusion Stopped (06/24/22 1104)   heparin 1,150 Units/hr (06/25/22 0557)    atorvastatin  20 mg Oral q1800   carvedilol  6.25 mg Oral BID WC   Chlorhexidine Gluconate Cloth  6 each Topical Q0600   clopidogrel  75 mg Oral Daily   sevelamer carbonate  1,600 mg Oral TID WC   sodium chloride flush  3 mL Intravenous Q12H   acetaminophen **OR** acetaminophen, alteplase, anticoagulant sodium citrate, heparin, heparin, hydrALAZINE, lidocaine (PF), lidocaine-prilocaine, ondansetron **OR** ondansetron (ZOFRAN) IV, pentafluoroprop-tetrafluoroeth, senna-docusate  Assessment/ Plan:  Ms. HAILLY FESS is a 76 y.o.  female with past medical conditions including hyperlipidemia, hypertension, cardiomegaly, and end-stage renal disease on hemodialysis.  Patient presents to the emergency department with complaints of shortness of breath and has been admitted for Hyperkalemia [E87.5] SOB (shortness of breath) [R06.02] Tachyarrhythmia [R00.0] Volume overload [E87.70]  [facility] [place]/MWF/left AVG/82 kg  Volume overload/hyperkalemia with end-stage renal disease on hemodialysis.  Last treatment received on Friday.  Potassium on ED arrival 5.9, increased to 6.1 after pharmacologic treatment.  Chest x-ray shows moderate pulmonary edema with bilateral pleural effusions.    Patient received urgent dialysis yesterday to correct potassium. UF 2.5L acheived. Due to scheduled cardiac cath on Thursday, will plan dialysis Wednesday in  preparation for procedure.    2. Anemia of chronic kidney disease Lab Results  Component Value Date   HGB 10.6 (L) 06/25/2022    Hemoglobin within desired target.   3. Secondary Hyperparathyroidism: with outpatient labs: PTH 798, phosphorus 8.5, calcium 8.0 on 04/29/22.   Lab Results  Component Value Date   CALCIUM 8.4 (L) 06/25/2022   PHOS 7.5 (H) 06/24/2022    We will continue to monitor bone mineral this admission.  Continue sevelamer ordered with meals.  4.  Hypertension with chronic kidney disease.  Home regimen includes carvedilol and lisinopril.  Currently receiving carvedilol and amiodarone drip.  Cardizem drip stopped yesterday.  5.  NSTEMI with elevated troponin and ST depression with SVT on EKG.  Cardiology following and plans for cardiac catheterization on Thursday.  Heparin drip in place.    LOS: 1   10/10/20231:20 PM

## 2022-06-25 NOTE — Assessment & Plan Note (Addendum)
Delirium/intermittent altered mental status. Patient again became altered with hypotension and bradycardia requiring transfer to stepdown. Bradycardia improved after getting a dose of atropine.  A-line shows normal blood pressure, peripheral reading remains low, pressors were held. CT head was negative for any acute infarct.  Chronic lacunar infarcts noted. CTA head and neck was negative for any large vessel occlusion.  Some stenosis noted as mentioned in full report. -Continue to monitor -Palliative care consult.

## 2022-06-25 NOTE — Consult Note (Addendum)
Amiodarone Drug - Drug Interaction Consult Note  Recommendations: No recommendations. Continue to monitor HR for bradycardia and BP, especially given hypotension. Home lisinopril 40 mg currently being held.  Amiodarone is metabolized by the cytochrome P450 system and therefore has the potential to cause many drug interactions. Amiodarone has an average plasma half-life of 50 days (range 20 to 100 days).   There is potential for drug interactions to occur several weeks or months after stopping treatment and the onset of drug interactions may be slow after initiating amiodarone.   '[x]'$  Statins: Increased risk of myopathy (especially since atorvastatin is a lipophilic statin) Counsel patients to report any muscle pain or weakness immediately.  $RemoveBefor'[]'fvCxFuDguacU$  Anticoagulants: Amiodarone can increase anticoagulant effect. Consider warfarin dose reduction. Patients should be monitored closely and the dose of anticoagulant altered accordingly, remembering that amiodarone levels take several weeks to stabilize.  $RemoveBef'[]'SyjPSjTnio$  Antiepileptics: Amiodarone can increase plasma concentration of phenytoin, the dose should be reduced. Note that small changes in phenytoin dose can result in large changes in levels. Monitor patient and counsel on signs of toxicity.  $RemoveBe'[x]'zrqUMfAfG$  Beta blockers: increased risk of bradycardia, AV block and myocardial depression. Sotalol - avoid concomitant use.  $Rem'[x]'ezUf$   Calcium channel blockers (diltiazem and verapamil): increased risk of bradycardia, AV block and myocardial depression.  $RemoveBefo'[]'ihXNdGsVJtU$   Cyclosporine: Amiodarone increases levels of cyclosporine. Reduced dose of cyclosporine is recommended.  $RemoveBefor'[]'RTRdjhkvqOws$  Digoxin dose should be halved when amiodarone is started.  $RemoveB'[]'nyyuTXwU$  Diuretics: increased risk of cardiotoxicity if hypokalemia occurs.  $Remove'[]'psSfmIN$  Oral hypoglycemic agents (glyburide, glipizide, glimepiride): increased risk of hypoglycemia. Patient's glucose levels should be monitored closely when initiating amiodarone therapy.    '[x]'$  Drugs that prolong the QT interval:  Torsades de pointes risk may be increased with concurrent use - avoid if possible.  Monitor QTc, also keep magnesium/potassium WNL if concurrent therapy can't be avoided. Ondansetron (although dosed only PRN)  Antibiotics: e.g. fluoroquinolones, erythromycin.  Antiarrhythmics: e.g. quinidine, procainamide, disopyramide, sotalol.  Antipsychotics: e.g. phenothiazines, haloperidol.   Lithium, tricyclic antidepressants, and methadone.  Thank You,  Dara Hoyer, PharmD PGY-1 Pharmacy Resident 06/25/2022 3:44 PM

## 2022-06-25 NOTE — Progress Notes (Signed)
  Chaplain On-Call received referral from [place], who reported her conversation with the patient and nieces last evening.  I visited the patient this morning, and offered listening support as she described her many health challenges of living with dialysis, and also recent heart problems.  The patient also spoke at length about the death of a close friend from her church. Chaplain provided spiritual and emotional support and prayer.  Chaplain Pollyann Samples M.Div., [facility]

## 2022-06-25 NOTE — Progress Notes (Signed)
Progress Note   Patient: Kathleen Sharp ULA:[id number] DOB: May 14, 1946 DOA: 06/28/2022     1 DOS: the patient was seen and examined on 06/25/2022   Brief hospital course: Ms. Kathleen Sharp is a 76 year old female with history of end-stage renal disease on hemodialysis Monday Wednesday 12-02-22, hypertension, cardiomegaly, hyperlipidemia, who presents emergency department for chief concerns of shortness of breath.  Patient was complaining of shortness of breath with associated flushed face and palpitations.  No chest pain, nausea or vomiting.  Initial vitals in the emergency department showed temperature of 97.5, respiration rate of 29, heart rate of 95, blood pressure 108/87, SPO2 of 96% on 4 L nasal cannula.  Serum sodium is 135, potassium 5.9, chloride of 97, bicarb 20, BUN of 63, serum creatinine of 9.33, GFR 4, nonfasting blood glucose 172, WBC 5.9, hemoglobin 11.8, platelets of 269.  Lactic acid is 4.1.  High sensitive troponin is 89.  COVID/influenza A/influenza B PCR negative.  ED treatment: Diltiazem 10 mg IV one-time dose, Lokelma p.o., insulin aspart 10 mg, D50, calcium gluconate, vancomycin and cefepime.  10/9: Patient overnight had another episode of dyspnea with flushed face and palpitations.  Found to have supraventricular tachycardia requiring IV diltiazem push.  Troponin increased to above 10,000, peaked at 14,207>>13,306.  Potassium at 6.1, BUN 17, creatinine 10.41, it yesterday 144, ALT 77 and anion gap of 16.  BNP >4500, D-dimer 2.66>>3.10. EKG with SVT, peaked T in anteroseptal and ST depression in lateral leads. Patient was started on heparin infusion by night on-call, cardiology and nephrology was consulted.  Going for dialysis this morning. Per patient she never missed any dialysis, last dialysis was on Saturday as she was unable to go for her 12/02/2022 session due to the death of a dear friend. Echocardiogram pending. Patient refused cardiac catheterization.  10/10:  Patient had 2 syncopal episodes, one last night and 1 this a.m. rapid response was called.  During this morning episode patient all of a sudden became unresponsive and appears very confused.  Initial vitals were stable.  Telemetry noted nonsustained V. tach followed by atrial fibrillation with RVR.  While preparing for amiodarone bolus patient converted back to sinus rhythm with heart rate in 60s.  CT chest and CTA was obtained to rule out CVA which was negative for any acute infarct and CTA of head and neck was negative for any significant large vessel stenosis. Her cardiologist was also consulted and she was placed on amiodarone infusion to prevent further arrhythmias. She also received 500 cc of bolus as blood pressure become softer little after this episode.  Hyperkalemia resolved.  Blood pressure little soft, Patient later agrees with cardiac catheterization which is scheduled for Thursday morning now.  Troponin at 11,321 after peaking at 14,207.  Echocardiogram with normal EF, indeterminate diastolic function, no regional wall motion abnormalities and severe calcification of aortic valve without any evidence of stenosis.  Patient will remain high risk for decompensation and other cardiac arrhythmias based on her underlying comorbidities and recent NSTEMI.  She will remain on heparin and amiodarone infusion.   Assessment and Plan: * NSTEMI (non-ST elevated myocardial infarction) ([place]) Significant increase in troponin, which peaked above 14,000, makes her in the range of NSTEMI.  EKG with ST depression in lateral leads along with SVT. Cardiology was consulted by night on-call provider and she was started on heparin infusion. BNP markedly elevated above 4500 Dr. Laurelyn Sickle PA saw her, apparently refused cardiac cath. Echocardiogram pending. -Continue with heparin infusion -Trend troponin -Plavix  was added by cardiology  Cardiac arrhythmia Patient is experiencing transient, self-limiting cardiac  arrhythmias where she became symptomatic.  Had 2 syncopal episode in the past 24 hours.  Most likely secondary to recent NSTEMI. Cardiology started her on amiodarone infusion. -Continue with telemetry monitoring  Syncope and collapse Patient had 2 syncopal episode in the past 24-hour.  Most likely secondary to cardiac arrhythmia as mentioned above. CT head was negative for any acute infarct.  Chronic lacunar infarcts noted. CTA head and neck was negative for any large vessel occlusion.  Some stenosis noted as mentioned in full report. -Continue to monitor  Pulmonary edema - Presumed secondary to SVT and NSTEMI - Received her dialysis yesterday  Volume overload - Presumed secondary to SVT and NSTEMI - Nephrology has been consulted for dialysis - Continue with oxygen-wean as tolerated  Hyperkalemia Resolved with dialysis. -Continue to monitor -Patient is high risk for cardiac arrhythmia  End stage renal disease ([place]) - Via left upper extremity AV fistula - Monday, Wednesday, Friday - last hemodialysis session was Saturday, she missed her routine dialysis on Friday due to diet of a close friend.  she received full treatment on Saturday - Nephrology consulted, and she received her dialysis yesterday  Essential hypertension - Patient takes Coreg 6.25 mg twice daily, lisinopril 40 mg daily - Coreg has been resumed for 06/24/2022 -Blood pressure currently soft. -Keep holding home lisinopril -Cardizem infusion was discontinued after improvement in heart rate, current clear on amiodarone infusion for tachyarrhythmias  Anemia of chronic renal failure, stage 5 (HCC) Seems stable and at baseline. -Continue to monitor   Subjective: Patient was seen and rapid response was called after having another syncopal episode.  She appears very disoriented and it took her a while to start answering the questions.  At that time she denies any pain, oriented to self and place only.  Keeps repeating  her birthday when asked about the year, does not appear to be comprehending.  Physical Exam: Vitals:   06/25/22 1225 06/25/22 1232 06/25/22 1255 06/25/22 1315  BP: (!) 77/61 (!) 84/63 (!) 85/61 90/63  Pulse: 71   69  Resp:      Temp: 97.8 F (36.6 C)     TempSrc:      SpO2: 100%   98%  Weight:      Height:       General.  Obese elderly lady, who appears lethargic and confused. Pulmonary.  Lungs clear bilaterally, normal respiratory effort. CV.  Irregularly irregular with tachycardia followed by regular Abdomen.  Soft, nontender, nondistended, BS positive. CNS.  Alert and oriented to self and place.  No focal neurologic deficit. Extremities.  No edema, no cyanosis, pulses intact and symmetrical. Psychiatry.  Judgment and insight appears impaired.  Data Reviewed: Prior data reviewed  Family Communication: Discussed with niece at bedside.  Disposition: Status is: Inpatient Remains inpatient appropriate because: Severity of illness   Planned Discharge Destination: Home with Home Health  DVT prophylaxis.  Heparin infusion Time spent: 55 minutes  This record has been created using Systems analyst. Errors have been sought and corrected,but may not always be located. Such creation errors do not reflect on the standard of care.  Author: Lorella Nimrod, MD 06/25/2022 2:31 PM  For on call review [email or web address].

## 2022-06-25 NOTE — Progress Notes (Signed)
ANTICOAGULATION CONSULT NOTE  Pharmacy Consult for heparin infusion Indication: ACS/STEMI  No Known Allergies  Patient Measurements: Height: 5\' 2"  (157.5 cm) Weight: 82.1 kg (181 lb) IBW/kg (Calculated) : 50.1 Heparin Dosing Weight: 68.5 kg  Vital Signs: Temp: 98.1 F (36.7 C) (10/10 0349) Temp Source: Oral (10/09 1950) BP: 95/73 (10/10 0425) Pulse Rate: 80 (10/10 0349)  Labs: Recent Labs    06/30/2022 2350 06/24/22 0005 06/24/22 0537 06/24/22 0953 06/24/22 1753 06/24/22 2127 06/24/22 2349 06/25/22 0223 06/25/22 0442  HGB 12.1  --  11.6*  --   --  10.9*  --   --  10.6*  HCT 38.6  --  36.5  --   --  34.9*  --   --  33.6*  PLT 232  --  235  --   --  242  --   --  230  APTT 23*  --   --   --   --   --   --   --   --   LABPROT 15.6*  --   --   --   --   --   --   --   --   INR 1.3*  --   --   --   --   --   --   --   --   HEPARINUNFRC  --   --   --  0.35 0.24*  --   --   --  0.28*  CREATININE 10.13*  --  10.41* 10.67*  --  6.68*  --   --  7.34*  TROPONINIHS 14,207*   < >  --   --   --  11,276* 12,073* 11,321*  --    < > = values in this interval not displayed.     Estimated Creatinine Clearance: 6.7 mL/min (A) (by C-G formula based on SCr of 7.34 mg/dL (H)).   Medical History: Past Medical History:  Diagnosis Date   Anemia    Arthritis    Chronic kidney disease    Chronic Kidney Disease   Hypercholesterolemia    Hypertension   Heparin Dosing Weight: 68.5 kg  Assessment: Pt is a 76 yo female presenting to ED w/ SOB found with elevated troponin I level, trending up  Goal of Therapy:  Heparin level 0.3-0.7 units/ml Monitor platelets by anticoagulation protocol: Yes  Date Time HL Rate/Comment 10/9 0953 0.35 Therapeutic x1 10/9 1753 0.24 Subtherapeutic; 850>1000 un/hr 10/10  0442 0.28 Subtherapeutic; 1000 > 1150 un/hr  Hgb 12.1>10.9>10.6; Plts 232>230 Trop 14k>13.3k>12.1k>11.3k  Plan:  Subtherapeutic HL. Bolus and increase as below. Bolus 1,000 units;  then increase heparin infusion to 1,150 units/hr Check anti-Xa level in 8 hours and daily once consecutively therapeutic. Continue to monitor H&H and platelets daily while on heparin gtt.'   Lorna Dibble Clinical Pharmacist 06/25/2022 5:40 AM

## 2022-06-26 DIAGNOSIS — I214 Non-ST elevation (NSTEMI) myocardial infarction: Secondary | ICD-10-CM | POA: Diagnosis not present

## 2022-06-26 DIAGNOSIS — D631 Anemia in chronic kidney disease: Secondary | ICD-10-CM | POA: Diagnosis not present

## 2022-06-26 DIAGNOSIS — Z515 Encounter for palliative care: Secondary | ICD-10-CM

## 2022-06-26 DIAGNOSIS — N2581 Secondary hyperparathyroidism of renal origin: Secondary | ICD-10-CM | POA: Diagnosis not present

## 2022-06-26 DIAGNOSIS — N186 End stage renal disease: Secondary | ICD-10-CM

## 2022-06-26 LAB — CBC
HCT: 33.2 % — ABNORMAL LOW (ref 36.0–46.0)
Hemoglobin: 10.6 g/dL — ABNORMAL LOW (ref 12.0–15.0)
MCH: 25.4 pg — ABNORMAL LOW (ref 26.0–34.0)
MCHC: 31.9 g/dL (ref 30.0–36.0)
MCV: 79.6 fL — ABNORMAL LOW (ref 80.0–100.0)
Platelets: 222 10*3/uL (ref 150–400)
RBC: 4.17 MIL/uL (ref 3.87–5.11)
RDW: 15.7 % — ABNORMAL HIGH (ref 11.5–15.5)
WBC: 17.3 10*3/uL — ABNORMAL HIGH (ref 4.0–10.5)
nRBC: 0.5 % — ABNORMAL HIGH (ref 0.0–0.2)

## 2022-06-26 LAB — BASIC METABOLIC PANEL
Anion gap: 17 — ABNORMAL HIGH (ref 5–15)
BUN: 64 mg/dL — ABNORMAL HIGH (ref 8–23)
CO2: 25 mmol/L (ref 22–32)
Calcium: 8.6 mg/dL — ABNORMAL LOW (ref 8.9–10.3)
Chloride: 92 mmol/L — ABNORMAL LOW (ref 98–111)
Creatinine, Ser: 8.41 mg/dL — ABNORMAL HIGH (ref 0.44–1.00)
GFR, Estimated: 5 mL/min — ABNORMAL LOW (ref >60)
Glucose, Bld: 84 mg/dL (ref 70–99)
Potassium: 5 mmol/L (ref 3.5–5.1)
Sodium: 134 mmol/L — ABNORMAL LOW (ref 135–145)

## 2022-06-26 LAB — HEPARIN LEVEL (UNFRACTIONATED): Heparin Unfractionated: 0.64 IU/mL (ref 0.30–0.70)

## 2022-06-26 LAB — GLUCOSE, CAPILLARY: Glucose-Capillary: 74 mg/dL (ref 70–99)

## 2022-06-26 MED ORDER — SODIUM CHLORIDE 0.9 % IV SOLN: 250.0000 mL | INTRAVENOUS | Status: DC | PRN

## 2022-06-26 MED ORDER — SODIUM CHLORIDE 0.9% FLUSH
3.0000 mL | INTRAVENOUS | Status: DC | PRN
  Administered 2022-06-27 – 2022-06-29 (×2): 3 mL via INTRAVENOUS

## 2022-06-26 MED ORDER — PENTAFLUOROPROP-TETRAFLUOROETH EX AERO
INHALATION_SPRAY | CUTANEOUS | Status: AC
  Filled 2022-06-26: qty 60

## 2022-06-26 MED ORDER — ASPIRIN 81 MG PO CHEW: 81.0000 mg | CHEWABLE_TABLET | ORAL | Status: AC

## 2022-06-26 MED ORDER — SODIUM CHLORIDE 0.9 % IV SOLN: INTRAVENOUS | Status: DC

## 2022-06-26 NOTE — Assessment & Plan Note (Addendum)
Significant increase in troponin, which peaked above 14,000, makes her in the range of NSTEMI.  EKG with ST depression in lateral leads along with SVT. Troponin continued to trend down, at 2872 today. Cardiology was consulted she was started on heparin infusion-which she continued more than 72 hours so it will be discontinued today. BNP markedly elevated above 4500 Cardiac catheterization on Thursday got canceled due to altered mental status and is now rescheduled for Monday at 7:30 AM. -Continue aspirin and Plavix -Continue to monitor

## 2022-06-26 NOTE — Progress Notes (Addendum)
Palliative:  Chart review completed. Kathleen Sharp is in HD.  PMT to follow tomorrow.  No charge Quinn Axe, NP Palliative medicine team Team phone [phone] Greater than 50% of this time was spent counseling and coordinating care related to the above assessment and plan.

## 2022-06-26 NOTE — Progress Notes (Signed)
Progress Note   Patient: Kathleen Sharp OZH:[id number] DOB: 1946-02-17 DOA: 06/20/2022     2 DOS: the patient was seen and examined on 06/26/2022   Brief hospital course: Kathleen Sharp is a 76 year old female with history of end-stage renal disease on hemodialysis Monday Wednesday December 01, 2022, hypertension, cardiomegaly, hyperlipidemia, who presents emergency department for chief concerns of shortness of breath.  Patient was complaining of shortness of breath with associated flushed face and palpitations.  No chest pain, nausea or vomiting.  Initial vitals in the emergency department showed temperature of 97.5, respiration rate of 29, heart rate of 95, blood pressure 108/87, SPO2 of 96% on 4 L nasal cannula.  Serum sodium is 135, potassium 5.9, chloride of 97, bicarb 20, BUN of 63, serum creatinine of 9.33, GFR 4, nonfasting blood glucose 172, WBC 5.9, hemoglobin 11.8, platelets of 269.  Lactic acid is 4.1.  High sensitive troponin is 89.  COVID/influenza A/influenza B PCR negative.  ED treatment: Diltiazem 10 mg IV one-time dose, Lokelma p.o., insulin aspart 10 mg, D50, calcium gluconate, vancomycin and cefepime.  10/9: Patient overnight had another episode of dyspnea with flushed face and palpitations.  Found to have supraventricular tachycardia requiring IV diltiazem push.  Troponin increased to above 10,000, peaked at 14,207>>13,306.  Potassium at 6.1, BUN 17, creatinine 10.41, it yesterday 144, ALT 77 and anion gap of 16.  BNP >4500, D-dimer 2.66>>3.10. EKG with SVT, peaked T in anteroseptal and ST depression in lateral leads. Patient was started on heparin infusion by night on-call, cardiology and nephrology was consulted.  Going for dialysis this morning. Per patient she never missed any dialysis, last dialysis was on Saturday as she was unable to go for her December 01, 2022 session due to the death of a dear friend. Echocardiogram pending. Patient refused cardiac catheterization.  10/10:  Patient had 2 syncopal episodes, one last night and 1 this a.m. rapid response was called.  During this morning episode patient all of a sudden became unresponsive and appears very confused.  Initial vitals were stable.  Telemetry noted nonsustained V. tach followed by atrial fibrillation with RVR.  While preparing for amiodarone bolus patient converted back to sinus rhythm with heart rate in 60s.  CT chest and CTA was obtained to rule out CVA which was negative for any acute infarct and CTA of head and neck was negative for any significant large vessel stenosis. Her cardiologist was also consulted and she was placed on amiodarone infusion to prevent further arrhythmias. She also received 500 cc of bolus as blood pressure become softer little after this episode.  Hyperkalemia resolved.  Blood pressure little soft, Patient later agrees with cardiac catheterization which is scheduled for Thursday morning now.  Troponin at 11,321 after peaking at 14,207.  Echocardiogram with normal EF, indeterminate diastolic function, no regional wall motion abnormalities and severe calcification of aortic valve without any evidence of stenosis.  10/11: Patient again very lethargic but following some simple commands when seen during morning rounds.  Blood pressure remained on softer side but improved from yesterday.  Telemetry with some paired PVCs and bigeminy. Going for dialysis today.  Cardiac cath planned for tomorrow morning, n.p.o. after midnight.  We will continue with amiodarone and heparin infusion.  Patient will remain high risk for decompensation and other cardiac arrhythmias based on her underlying comorbidities and recent NSTEMI.  She will remain on heparin and amiodarone infusion.  Palliative care was consulted.   Assessment and Plan: * NSTEMI (non-ST elevated myocardial infarction) ([place])  Significant increase in troponin, which peaked above 14,000, makes her in the range of NSTEMI.  EKG with ST depression  in lateral leads along with SVT. Cardiology was consulted she was started on heparin infusion. BNP markedly elevated above 4500 Going for cardiac catheterization tomorrow morning. -Continue with heparin infusion -Plavix was added by cardiology  Cardiac arrhythmia Patient is experiencing transient, self-limiting cardiac arrhythmias where she became symptomatic.  Had 2 syncopal episode in the past 24 hours.  Most likely secondary to recent NSTEMI. Cardiology started her on amiodarone infusion. -Continue with telemetry monitoring  Syncope and collapse Patient had 2 syncopal episode in the past 24-hour.  Most likely secondary to cardiac arrhythmia as mentioned above. CT head was negative for any acute infarct.  Chronic lacunar infarcts noted. CTA head and neck was negative for any large vessel occlusion.  Some stenosis noted as mentioned in full report. -Continue to monitor  Pulmonary edema - Presumed secondary to SVT and NSTEMI - Received her dialysis on admission and going for her routine dialysis today.  Volume overload - Presumed secondary to SVT and NSTEMI - Nephrology has been consulted for dialysis - Continue with oxygen-wean as tolerated  Hyperkalemia Resolved with dialysis. -Continue to monitor -Patient is high risk for cardiac arrhythmia  End stage renal disease ([place]) - Via left upper extremity AV fistula - Monday, Wednesday, Friday - last hemodialysis session was Saturday, she missed her routine dialysis on Friday due to diet of a close friend.  she received full treatment on Saturday - Nephrology consulted, and she received her dialysis yesterday  Essential hypertension - Patient takes Coreg 6.25 mg twice daily, lisinopril 40 mg daily Blood pressure currently soft, required couple of boluses over the past 24 hours. -Keep holding home antihypertensives -Continue to monitor  Anemia of chronic renal failure, stage 5 (HCC) Seems stable and at baseline. -Continue to  monitor   Subjective: Patient seems very lethargic and somnolent, did not answer any question.  Open eyes momentarily when called her name.  Following some simple commands.  Physical Exam: Vitals:   06/26/22 1342 06/26/22 1358 06/26/22 1400 06/26/22 1430  BP: 102/62 118/65 113/68 111/69  Pulse: 65 66 63 63  Resp: 16 18 (!) 22 (!) 21  Temp: 97.7 F (36.5 C)     TempSrc: Oral     SpO2: 100% 100% 100% 95%  Weight: 83.6 kg     Height:       General.  Lethargic and somnolent lady, in no acute distress. Pulmonary.  Lungs clear bilaterally, normal respiratory effort. CV.  Regular rate and rhythm, no JVD, rub or murmur. Abdomen.  Soft, nontender, nondistended, BS positive. CNS.  Somnolent, following some simple commands Extremities.  No edema, no cyanosis, pulses intact and symmetrical. Psychiatry.  Judgment and insight appears impaired.  Data Reviewed: Prior data reviewed  Family Communication: Tried calling niece with no response.  Disposition: Status is: Inpatient Remains inpatient appropriate because: Severity of illness   Planned Discharge Destination: Home with Home Health  DVT prophylaxis.  Heparin infusion Time spent: 50 minutes  This record has been created using Systems analyst. Errors have been sought and corrected,but may not always be located. Such creation errors do not reflect on the standard of care.  Author: Lorella Nimrod, MD 06/26/2022 3:00 PM  For on call review [email or web address].

## 2022-06-26 NOTE — Progress Notes (Signed)
Pt completed 3.5 hour HD treatment w/ no complications. Alert, vss, report to primary RN.  Start: 1358 End: 4707 2033ml fluid removed 78.7L BVP 82.2 Kg post HD bed weight No meds ordered w/ HD

## 2022-06-26 NOTE — Progress Notes (Signed)
Post HD RN assessment

## 2022-06-26 NOTE — Progress Notes (Signed)
SUBJECTIVE: Patient is a 76 year old female with a history of ESRD on dialysis MWF, hypertension, cardiomegaly, hyperlipidemia, who presented to the ED on 07/01/2022 with shortness of breath, palpitations. Denies chest pain.    On 10/10, patient had an episode of nonsustained ventricular tachycardia followed by atrial fibrillation associated with dizziness. Patient denies losing consciousness. IV amiodoarone started.    Vitals:   06/26/22 0440 06/26/22 0604 06/26/22 0802 06/26/22 0807  BP: 98/64   94/64  Pulse: (!) 59   63  Resp: 18  (!) 32 (!) 24  Temp: 98.3 F (36.8 C)   98 F (36.7 C)  TempSrc:    Oral  SpO2: 100%   100%  Weight:  81.6 kg    Height:        Intake/Output Summary (Last 24 hours) at 06/26/2022 1011 Last data filed at 06/25/2022 1847 Gross per 24 hour  Intake 800.92 ml  Output 0 ml  Net 800.92 ml    LABS: Basic Metabolic Panel: Recent Labs    06/24/22 0953 06/24/22 1330 06/24/22 2127 06/25/22 0442 06/26/22 0621  NA 137  --  137 135 134*  K 6.2*   < > 4.9 4.8 5.0  CL 99  --  90* 93* 92*  CO2 21*  --  $R'24 28 25  'ZN$ GLUCOSE 72  --  137* 97 84  BUN 76*  --  39* 45* 64*  CREATININE 10.67*  --  6.68* 7.34* 8.41*  CALCIUM 8.8*  --  8.6* 8.4* 8.6*  MG  --   --  1.8  --   --   PHOS 7.5*  --   --   --   --    < > = values in this interval not displayed.   Liver Function Tests: Recent Labs    06/24/22 0537 06/24/22 0953 06/24/22 2127  AST 144*  --  144*  ALT 77*  --  118*  ALKPHOS 59  --  56  BILITOT 0.8  --  0.9  PROT 6.9  --  6.7  ALBUMIN 3.6 3.6 3.4*   No results for input(s): "LIPASE", "AMYLASE" in the last 72 hours. CBC: Recent Labs    07/05/2022 1615 06/16/2022 2350 06/25/22 0442 06/26/22 0621  WBC 5.9   < > 14.8* 17.3*  NEUTROABS 5.2  --   --   --   HGB 11.8*   < > 10.6* 10.6*  HCT 38.5   < > 33.6* 33.2*  MCV 82.4   < > 79.8* 79.6*  PLT 269   < > 230 222   < > = values in this interval not displayed.   Cardiac Enzymes: No results for  input(s): "CKTOTAL", "CKMB", "CKMBINDEX", "TROPONINI" in the last 72 hours. BNP: Invalid input(s): "POCBNP" D-Dimer: Recent Labs    07/16/2022 2350 06/24/22 0148  DDIMER 2.66* 3.10*   Hemoglobin A1C: No results for input(s): "HGBA1C" in the last 72 hours. Fasting Lipid Panel: No results for input(s): "CHOL", "HDL", "LDLCALC", "TRIG", "CHOLHDL", "LDLDIRECT" in the last 72 hours. Thyroid Function Tests: No results for input(s): "TSH", "T4TOTAL", "T3FREE", "THYROIDAB" in the last 72 hours.  Invalid input(s): "FREET3" Anemia Panel: No results for input(s): "VITAMINB12", "FOLATE", "FERRITIN", "TIBC", "IRON", "RETICCTPCT" in the last 72 hours.   PHYSICAL EXAM General: Well developed, well nourished, in no acute distress HEENT:  Normocephalic and atramatic Neck:  No JVD.  Lungs: Clear bilaterally to auscultation and percussion. Heart: HRRR . Normal S1 and S2 without gallops or murmurs.  Abdomen: Bowel sounds are positive, abdomen soft and non-tender  Msk:  Back normal, normal gait. Normal strength and tone for age. Extremities: No clubbing, cyanosis or edema.   Neuro: Alert and oriented X 2. Psych:  Good affect  TELEMETRY: sinus rhythm  ASSESSMENT AND PLAN: Patient scheduled for cardiac catheterization tomorrow morning. Continue IV amiodarone to prevent further episodes of v tach or atrial fibrillation. B/p continues to be soft. Will continue to follow.   Principal Problem:   NSTEMI (non-ST elevated myocardial infarction) [facility]) Active Problems:   Anemia of chronic renal failure, stage 5 (HCC)   End stage renal disease (HCC)   Essential hypertension   Volume overload   Hyperkalemia   Pulmonary edema   Cardiac arrhythmia   Syncope and collapse    Kathleen Mcmahan, FNP-C 06/26/2022 10:11 AM

## 2022-06-26 NOTE — Progress Notes (Signed)
[facility]  ROUNDING NOTE   Subjective:   Kathleen Sharp is a 76 year old female with past medical conditions including hyperlipidemia, hypertension, cardiomegaly, and end-stage renal disease on hemodialysis.  Patient presents to the emergency department with complaints of shortness of breath and has been admitted for Hyperkalemia [E87.5] SOB (shortness of breath) [R06.02] Tachyarrhythmia [R00.0] Volume overload [E87.70]  Patient is known to our practice and receives outpatient dialysis treatments at [facility] on a MWF schedule, supervised by Dr. Thedore Mins.   Patient seen sitting up in bed, lethargic Untouched breakfast tray at bedside Intermittent jerk of left arm  Objective:  Vital signs in last 24 hours:  Temp:  [97.5 F (36.4 C)-98.3 F (36.8 C)] 97.7 F (36.5 C) (10/11 1342) Pulse Rate:  [59-70] 66 (10/11 1358) Resp:  [14-32] 18 (10/11 1358) BP: (76-120)/(47-66) 118/65 (10/11 1358) SpO2:  [95 %-100 %] 100 % (10/11 1358) Weight:  [81.6 kg-83.6 kg] 83.6 kg (10/11 1342)  Weight change: -2.998 kg Filed Weights   06/24/22 1343 06/26/22 0604 06/26/22 1342  Weight: 82.1 kg 81.6 kg 83.6 kg    Intake/Output: I/O last 3 completed shifts: In: 800.9 [I.V.:300.9; IV Piggyback:500] Out: 0    Intake/Output this shift:  No intake/output data recorded.  Physical Exam: General: Somnolent  Head: Normocephalic, atraumatic. Moist oral mucosal membranes  Eyes: Anicteric  Lungs:  Diminished in bases, normal effort, room air  Heart: Regular rate and rhythm  Abdomen:  Soft, nontender, obese  Extremities: No peripheral edema.  Neurologic: Nonfocal, moving all four extremities  Skin: No lesions  Access: Left AVG    Basic Metabolic Panel: Recent Labs  Lab 06/24/22 0537 06/24/22 0953 06/24/22 1330 06/24/22 2127 06/25/22 0442 06/26/22 0621  NA 139 137  --  137 135 134*  K 6.1* 6.2* 3.0* 4.9 4.8 5.0  CL 100 99  --  90* 93* 92*  CO2 23 21*  --  24 28  25   GLUCOSE 90 72  --  137* 97 84  BUN 72* 76*  --  39* 45* 64*  CREATININE 10.41* 10.67*  --  6.68* 7.34* 8.41*  CALCIUM 9.2 8.8*  --  8.6* 8.4* 8.6*  MG  --   --   --  1.8  --   --   PHOS  --  7.5*  --   --   --   --      Liver Function Tests: Recent Labs  Lab 06/24/22 0537 06/24/22 0953 06/24/22 2127  AST 144*  --  144*  ALT 77*  --  118*  ALKPHOS 59  --  56  BILITOT 0.8  --  0.9  PROT 6.9  --  6.7  ALBUMIN 3.6 3.6 3.4*    No results for input(s): "LIPASE", "AMYLASE" in the last 168 hours. No results for input(s): "AMMONIA" in the last 168 hours.  CBC: Recent Labs  Lab 06/22/2022 1615 06/29/2022 2350 06/24/22 0537 06/24/22 2127 06/25/22 0442 06/26/22 0621  WBC 5.9 7.5 13.2* 12.3* 14.8* 17.3*  NEUTROABS 5.2  --   --   --   --   --   HGB 11.8* 12.1 11.6* 10.9* 10.6* 10.6*  HCT 38.5 38.6 36.5 34.9* 33.6* 33.2*  MCV 82.4 78.9* 79.3* 80.0 79.8* 79.6*  PLT 269 232 235 242 230 222     Cardiac Enzymes: No results for input(s): "CKTOTAL", "CKMB", "CKMBINDEX", "TROPONINI" in the last 168 hours.  BNP: Invalid input(s): "POCBNP"  CBG: Recent Labs  Lab 06/24/22 2053  06/25/22 1103  GLUCAP 91 3     Microbiology: Results for orders placed or performed during the hospital encounter of 07/05/2022  Resp Panel by RT-PCR (Flu A&B, Covid) Anterior Nasal Swab     Status: None   Collection Time: 07/05/2022  5:13 PM   Specimen: Anterior Nasal Swab  Result Value Ref Range Status   SARS Coronavirus 2 by RT PCR NEGATIVE NEGATIVE Final    Comment: (NOTE) SARS-CoV-2 target nucleic acids are NOT DETECTED.  The SARS-CoV-2 RNA is generally detectable in upper respiratory specimens during the acute phase of infection. The lowest concentration of SARS-CoV-2 viral copies this assay can detect is 138 copies/mL. A negative result does not preclude SARS-Cov-2 infection and should not be used as the sole basis for treatment or other patient management decisions. A negative result may  occur with  improper specimen collection/handling, submission of specimen other than nasopharyngeal swab, presence of viral mutation(s) within the areas targeted by this assay, and inadequate number of viral copies(<138 copies/mL). A negative result must be combined with clinical observations, patient history, and epidemiological information. The expected result is Negative.  Fact Sheet for Patients:  EntrepreneurPulse.com.au  Fact Sheet for Healthcare Providers:  IncredibleEmployment.be  This test is no t yet approved or cleared by the [place] FDA and  has been authorized for detection and/or diagnosis of SARS-CoV-2 by FDA under an Emergency Use Authorization (EUA). This EUA will remain  in effect (meaning this test can be used) for the duration of the COVID-19 declaration under Section 564(b)(1) of the Act, 21 U.S.C.section 360bbb-3(b)(1), unless the authorization is terminated  or revoked sooner.       Influenza A by PCR NEGATIVE NEGATIVE Final   Influenza B by PCR NEGATIVE NEGATIVE Final    Comment: (NOTE) The Xpert Xpress SARS-CoV-2/FLU/RSV plus assay is intended as an aid in the diagnosis of influenza from Nasopharyngeal swab specimens and should not be used as a sole basis for treatment. Nasal washings and aspirates are unacceptable for Xpert Xpress SARS-CoV-2/FLU/RSV testing.  Fact Sheet for Patients: EntrepreneurPulse.com.au  Fact Sheet for Healthcare Providers: IncredibleEmployment.be  This test is not yet approved or cleared by the [place] FDA and has been authorized for detection and/or diagnosis of SARS-CoV-2 by FDA under an Emergency Use Authorization (EUA). This EUA will remain in effect (meaning this test can be used) for the duration of the COVID-19 declaration under Section 564(b)(1) of the Act, 21 U.S.C. section 360bbb-3(b)(1), unless the authorization is terminated  or revoked.  Performed at [facility], [address] [place], [place] [address]   MRSA Next Gen by PCR, Nasal     Status: None   Collection Time: 06/24/22  4:49 PM   Specimen: Nasal Mucosa; Nasal Swab  Result Value Ref Range Status   MRSA by PCR Next Gen NOT DETECTED NOT DETECTED Final    Comment: (NOTE) The GeneXpert MRSA Assay (FDA approved for NASAL specimens only), is one component of a comprehensive MRSA colonization surveillance program. It is not intended to diagnose MRSA infection nor to guide or monitor treatment for MRSA infections. Test performance is not FDA approved in patients less than 47 years old. Performed at [facility], [address] [place], [place] [address]     Coagulation Studies: Recent Labs    06/17/2022 2350  LABPROT 15.6*  INR 1.3*     Urinalysis: No results for input(s): "COLORURINE", "LABSPEC", "PHURINE", "GLUCOSEU", "HGBUR", "BILIRUBINUR", "KETONESUR", "PROTEINUR", "UROBILINOGEN", "NITRITE", "LEUKOCYTESUR" in the last  72 hours.  Invalid input(s): "APPERANCEUR"    Imaging: CT ANGIO HEAD NECK W WO CM  Result Date: 06/25/2022 CLINICAL DATA:  Provided history: Change in mental status. Additional history provided: sncopal episode last night. EXAM: CT ANGIOGRAPHY HEAD AND NECK TECHNIQUE: Multidetector CT imaging of the head and neck was performed using the standard protocol during bolus administration of intravenous contrast. Multiplanar CT image reconstructions and MIPs were obtained to evaluate the vascular anatomy. Carotid stenosis measurements (when applicable) are obtained utilizing NASCET criteria, using the distal internal carotid diameter as the denominator. RADIATION DOSE REDUCTION: This exam was performed according to the departmental dose-optimization program which includes automated exposure control, adjustment of the mA and/or kV according to patient size and/or use of iterative reconstruction technique. CONTRAST:   29mL OMNIPAQUE IOHEXOL 350 MG/ML SOLN COMPARISON:  No pertinent prior exams available for comparison. FINDINGS: CT HEAD FINDINGS Brain: Mild generalized cerebral atrophy. Moderate patchy and ill-defined hypoattenuation within the cerebral white matter, nonspecific but compatible with chronic small vessel disease. Small chronic lacunar infarct within the left caudate head. There is no acute intracranial hemorrhage. No demarcated cortical infarct. No extra-axial fluid collection. No evidence of an intracranial mass. No midline shift. Vascular: No hyperdense vessel. Atherosclerotic calcifications. Skull: No fracture or aggressive osseous lesion. Sinuses/Orbits: No mass or acute finding within the imaged orbits. Fluid level, and background mild mucosal thickening, within the left sphenoid sinus. Review of the MIP images confirms the above findings CTA NECK FINDINGS Aortic arch: Common origin of the innominate left common carotid arteries. Atherosclerotic plaque within the visualized aortic arch and proximal major branch vessels of the neck. Streak and beam hardening artifact arising from a dense right-sided contrast bolus partially obscures the right subclavian artery. Within this limitation, there is no appreciable hemodynamically significant innominate or proximal subclavian artery stenosis. Right carotid system: CCA and ICA patent within the neck without hemodynamically significant stenosis (50% or greater). Atherosclerotic plaque at the CCA origin, within the proximal CCA, about the carotid bifurcation and within the proximal ICA. Left carotid system: CCA and ICA patent within the neck without hemodynamically significant stenosis (50% or greater). Atherosclerotic plaque at the CCA origin, about the carotid bifurcation and within the proximal ICA. Vertebral arteries: The dominant right vertebral artery is patent within the neck. Mild atherosclerotic narrowing at the origin of this vessel. The non dominant left  vertebral artery is developmentally diminutive, but patent throughout the neck. Skeleton: Cervical spondylosis. Facet joint ankylosis on the right at C4-C5. No acute fracture or aggressive osseous lesion. Other neck: Calcified nodules within the bilateral thyroid lobes, measuring up to 13 mm, not meeting consensus criteria for ultrasound follow-up based on size. No follow-up imaging is recommended. Thyroid reference no cervical lymphadenopathy. Upper chest: No consolidation within the imaged lung apices. Emphysema. Review of the MIP images confirms the above findings CTA HEAD FINDINGS Anterior circulation: The intracranial internal carotid arteries are patent. Atherosclerotic plaque within both vessels. Mild to moderate stenosis within the right paraclinoid segment. No more than mild atherosclerotic narrowing of the intracranial left ICA The M1 middle cerebral arteries are patent. No M2 proximal branch occlusion or high-grade proximal stenosis. The anterior cerebral arteries are patent. No intracranial aneurysm is identified. Posterior circulation: The dominant cranial right vertebral artery is patent. Nonstenotic atherosclerotic plaque within this vessel. Severe stenosis within the proximal left V4 segment. The basilar artery is patent. The posterior cerebral arteries are patent. Hypoplastic P1 segment with sizable posterior communicating arteries, bilaterally. Venous sinuses: Within the  limitations of contrast timing, no convincing thrombus. Anatomic variants: As described Review of the MIP images confirms the above findings IMPRESSION: CT head: 1. No evidence of acute intracranial abnormality. 2. Moderate chronic small ischemic disease within the cerebral white matter. 3. Small chronic lacunar infarct within the left caudate head. 4. Mild generalized cerebral atrophy. 5. Left sphenoid sinusitis. CTA neck: 1. The common carotid and internal carotid arteries are patent within the neck without hemodynamically  significant stenosis. Atherosclerotic plaque, bilaterally. 2. Vertebral arteries patent within the neck. Mild atherosclerotic narrowing at the origin of the dominant right vertebral artery. 3. Aortic Atherosclerosis (ICD10-I70.0) and Emphysema (ICD10-J43.9). CTA head: 1. No intracranial large vessel occlusion is identified. 2. Intracranial atherosclerotic disease with multifocal stenoses, most notably as follows. 3. Severe stenosis within the left vertebral artery proximal V4 segment. 4. Mild-to-moderate stenosis within the paraclinoid right ICA. Electronically Signed   By: Kellie Simmering D.O.   On: 06/25/2022 12:39   DG Chest Port 1 View  Result Date: 06/25/2022 CLINICAL DATA:  Shortness of breath EXAM: PORTABLE CHEST 1 VIEW COMPARISON:  Chest x-ray June 23, 2022 FINDINGS: Unchanged cardiomediastinal contours including cardiomegaly. Slightly improved aeration of bibasilar lungs. Persistent bilateral reticular and interstitial pulmonary opacities. Probable small bilateral pleural effusions. No large pneumothorax. The visualized upper abdomen is unremarkable. No acute osseous abnormality. IMPRESSION: 1. Slightly improved aeration of bilateral lungs with persistent interstitial pulmonary opacities, most consistent with pulmonary edema. 2. Small bilateral pleural effusions. 3. Cardiomegaly. Electronically Signed   By: Beryle Flock M.D.   On: 06/25/2022 11:42     Medications:    amiodarone 30 mg/hr (06/26/22 0439)   anticoagulant sodium citrate     diltiazem (CARDIZEM) infusion Stopped (06/24/22 1104)   heparin 1,150 Units/hr (06/25/22 2223)    atorvastatin  20 mg Oral q1800   carvedilol  6.25 mg Oral BID WC   Chlorhexidine Gluconate Cloth  6 each Topical Q0600   clopidogrel  75 mg Oral Daily   pentafluoroprop-tetrafluoroeth       sevelamer carbonate  1,600 mg Oral TID WC   sodium chloride flush  3 mL Intravenous Q12H   acetaminophen **OR** acetaminophen, alteplase, anticoagulant sodium  citrate, heparin, heparin, hydrALAZINE, lidocaine (PF), lidocaine-prilocaine, ondansetron **OR** ondansetron (ZOFRAN) IV, pentafluoroprop-tetrafluoroeth, pentafluoroprop-tetrafluoroeth, senna-docusate  Assessment/ Plan:  Kathleen Sharp is a 76 y.o.  female with past medical conditions including hyperlipidemia, hypertension, cardiomegaly, and end-stage renal disease on hemodialysis.  Patient presents to the emergency department with complaints of shortness of breath and has been admitted for Hyperkalemia [E87.5] SOB (shortness of breath) [R06.02] Tachyarrhythmia [R00.0] Volume overload [E87.70]  [facility]/M/F/left AVG/82 kg  Volume overload/hyperkalemia with end-stage renal disease on hemodialysis.  Last treatment received on Friday.  Potassium on ED arrival 5.9, increased to 6.1 after pharmacologic treatment.  Chest x-ray shows moderate pulmonary edema with bilateral pleural effusions.    Scheduled to receive dialysis today, UF goal 2 L as tolerated.  Patient receiving this additional treatment due to scheduled cardiac cath tomorrow.  Next treatment scheduled for Friday.  2. Anemia of chronic kidney disease Lab Results  Component Value Date   HGB 10.6 (L) 06/26/2022    Hemoglobin at goal  3. Secondary Hyperparathyroidism: with outpatient labs: PTH 798, phosphorus 8.5, calcium 8.0 on 04/29/22.   Lab Results  Component Value Date   CALCIUM 8.6 (L) 06/26/2022   PHOS 7.5 (H) 06/24/2022    Phosphorus slightly elevated.  This will correct slowly with dialysis.  Continue  sevelamer ordered with meals.  4.  Hypertension with chronic kidney disease.  Home regimen includes carvedilol and lisinopril.  Remains on receiving carvedilol and amiodarone drip.  Blood pressure stable.  5.  NSTEMI with elevated troponin and ST depression with SVT on EKG.  Cardiology following and plans for cardiac catheterization on Thursday.  Heparin drip in place.    LOS: 2 [place] 10/11/20232:15 PM

## 2022-06-26 NOTE — Progress Notes (Signed)
Pt noted with wavering and irradiac behavior, attempting to get out of bed at times and flailing around in bed, then suddenly move to a state of relaxation(lethargy), sudden change in mental status, updated NP, NP rounded, orders noted. Attempted to reorient pt, pt unable to follow commands. CBG 74, VS noted, will continue to monitor. SRP, RN

## 2022-06-26 NOTE — Progress Notes (Signed)
Pre hd rn assessment 

## 2022-06-26 NOTE — Progress Notes (Signed)
ANTICOAGULATION CONSULT NOTE  Pharmacy Consult for heparin infusion Indication: ACS/STEMI  No Known Allergies  Patient Measurements: Height: 5\' 2"  (157.5 cm) Weight: 81.6 kg (179 lb 14.4 oz) IBW/kg (Calculated) : 50.1 Heparin Dosing Weight: 68.5 kg  Vital Signs: Temp: 98.3 F (36.8 C) (10/11 0440) Temp Source: Oral (10/10 2345) BP: 98/64 (10/11 0440) Pulse Rate: 59 (10/11 0440)  Labs: Recent Labs    07/01/2022 2350 06/24/22 0005 06/24/22 0953 06/24/22 1753 06/24/22 2127 06/24/22 2349 06/25/22 0223 06/25/22 0442 06/25/22 1439 06/25/22 2219 06/26/22 0621  HGB 12.1   < >  --   --  10.9*  --   --  10.6*  --   --  10.6*  HCT 38.6   < >  --   --  34.9*  --   --  33.6*  --   --  33.2*  PLT 232   < >  --   --  242  --   --  230  --   --  222  APTT 23*  --   --   --   --   --   --   --   --   --   --   LABPROT 15.6*  --   --   --   --   --   --   --   --   --   --   INR 1.3*  --   --   --   --   --   --   --   --   --   --   HEPARINUNFRC  --   --  0.35   < >  --   --   --  0.28* 0.51 0.49 0.64  CREATININE 10.13*   < > 10.67*  --  6.68*  --   --  7.34*  --   --   --   TROPONINIHS 14,207*   < >  --   --  11,276* 12,073* 11,321*  --   --   --   --    < > = values in this interval not displayed.     Estimated Creatinine Clearance: 6.7 mL/min (A) (by C-G formula based on SCr of 7.34 mg/dL (H)).   Medical History: Past Medical History:  Diagnosis Date   Anemia    Arthritis    Chronic kidney disease    Chronic Kidney Disease   Hypercholesterolemia    Hypertension   Heparin Dosing Weight: 68.5 kg  Assessment: Pt is a 76 yo female presenting to ED w/ SOB found with elevated troponin I level, trending up  Goal of Therapy:  Heparin level 0.3-0.7 units/ml Monitor platelets by anticoagulation protocol: Yes  Date Time HL Rate/Comment 10/9 0953 0.35 Therapeutic x1 10/9 1753 0.24 Subtherapeutic; 850>1000 un/hr 10/10  0442 0.28 Subtherapeutic; 1000 > 1150  un/hr 10/10 1439 0.51 Therapeutic x1 10/10 2219 0.49 Thera x2; 1150 un/hr 10/11 0621 0.64 Thera x2; 1150 un/hr  Hgb 12.1>10.9>10.6; Plts 232>230 Trop 14k>13.3k>12.1k>11.3k  Plan:  HL therapeutic x3.  Continue heparin infusion at 1,150 units/hr Check HL daily with AM labs since consecutively therapeutic. Continue to monitor H&H and platelets daily while on heparin gtt.   Lorna Dibble Clinical Pharmacist 06/26/2022 6:51 AM

## 2022-06-26 NOTE — Progress Notes (Addendum)
Tele-sitter monitor at bedside ordered. Pt reminded to remain in bed and do not pull at O2 and IV lines. Will continue to remain pt and monitor,. SRP,RN

## 2022-06-26 NOTE — TOC Initial Note (Signed)
Transition of Care [facility]) - Initial/Assessment Note    Patient Details  Name: Kathleen Sharp MRN: [id number] Date of Birth: 1946/06/27  Transition [facility] CM/SW Contact:    Alberteen Sam, LCSW Phone Number: 06/26/2022, 10:32 AM  Clinical Narrative:                  Readmission risk assessment completed with niece Kathleen Sharp as patient in procedure.   Kathleen Sharp reports patient is from home alone, typically drives herself to and from HD (MWF at [facility]). Reports patient uses a cane occasionally, has not needed a walker or any other dme at baseline. All questions/concerns answered at this time contact information provided to [place].   TOC will continue to follow for when patient is closer to medical readiness to discharge.     Expected Discharge Plan:  (TBD) Barriers to Discharge: Continued Medical Work up   Patient Goals and CMS Choice Patient states their goals for this hospitalization and ongoing recovery are:: to go home CMS Medicare.gov Compare Post Acute Care list provided to:: Patient Represenative (must comment) (niece Kathleen Sharp)    Expected Discharge Plan and Services Expected Discharge Plan:  (TBD)       Living arrangements for the past 2 months: Single Family Home                                      Prior Living Arrangements/Services Living arrangements for the past 2 months: Single Family Home Lives with:: Self                   Activities of Daily Living Home Assistive Devices/Equipment: None ADL Screening (condition at time of admission) Patient's cognitive ability adequate to safely complete daily activities?: Yes Is the patient deaf or have difficulty hearing?: No Does the patient have difficulty seeing, even when wearing glasses/contacts?: No Does the patient have difficulty concentrating, remembering, or making decisions?: No Patient able to express need for assistance with ADLs?: Yes Does the patient have difficulty dressing or bathing?:  No Independently performs ADLs?: Yes (appropriate for developmental age) Does the patient have difficulty walking or climbing stairs?: No Weakness of Legs: None Weakness of Arms/Hands: None  Permission Sought/Granted                  Emotional Assessment              Admission diagnosis:  Hyperkalemia [E87.5] SOB (shortness of breath) [R06.02] Tachyarrhythmia [R00.0] Volume overload [E87.70] Patient Active Problem List   Diagnosis Date Noted   Cardiac arrhythmia 06/25/2022   Syncope and collapse 06/25/2022   NSTEMI (non-ST elevated myocardial infarction) ([place]) 06/24/2022   Volume overload 06/26/2022   Hyperkalemia 07/14/2022   Pulmonary edema 06/20/2022   SOB (shortness of breath)    Acute hypoxemic respiratory failure due to COVID-19 ([place]) 08/14/2020   End stage renal disease ([place]) 31/54/0086   Complication of vascular access for dialysis 07/11/2016   Essential hypertension 07/11/2016   Hemodialysis-associated hypotension 07/11/2016   Anemia of chronic renal failure, stage 5 ([place]) 07/18/2015   PCP:  Adalberto Ill, MD Pharmacy:   [id number] - [place], [place] - [address] [place] [address] Phone: [phone] Fax: [phone]     Social Determinants of Health (SDOH) Interventions    Readmission Risk Interventions     No data to display

## 2022-06-27 ENCOUNTER — Encounter: Payer: Self-pay | Admitting: Internal Medicine

## 2022-06-27 ENCOUNTER — Inpatient Hospital Stay: Payer: Medicare HMO

## 2022-06-27 ENCOUNTER — Inpatient Hospital Stay: Payer: Self-pay

## 2022-06-27 DIAGNOSIS — I214 Non-ST elevation (NSTEMI) myocardial infarction: Secondary | ICD-10-CM | POA: Diagnosis not present

## 2022-06-27 DIAGNOSIS — Z515 Encounter for palliative care: Secondary | ICD-10-CM | POA: Diagnosis not present

## 2022-06-27 DIAGNOSIS — Z7189 Other specified counseling: Secondary | ICD-10-CM | POA: Diagnosis not present

## 2022-06-27 DIAGNOSIS — R0602 Shortness of breath: Secondary | ICD-10-CM

## 2022-06-27 LAB — CBC
HCT: 30.1 % — ABNORMAL LOW (ref 36.0–46.0)
HCT: 33.8 % — ABNORMAL LOW (ref 36.0–46.0)
Hemoglobin: 9.5 g/dL — ABNORMAL LOW (ref 12.0–15.0)
Hemoglobin: 10.7 g/dL — ABNORMAL LOW (ref 12.0–15.0)
MCH: 25 pg — ABNORMAL LOW (ref 26.0–34.0)
MCH: 24.9 pg — ABNORMAL LOW (ref 26.0–34.0)
MCHC: 31.6 g/dL (ref 30.0–36.0)
MCHC: 31.7 g/dL (ref 30.0–36.0)
MCV: 79.2 fL — ABNORMAL LOW (ref 80.0–100.0)
MCV: 78.8 fL — ABNORMAL LOW (ref 80.0–100.0)
Platelets: 201 10*3/uL (ref 150–400)
Platelets: 230 10*3/uL (ref 150–400)
RBC: 3.8 MIL/uL — ABNORMAL LOW (ref 3.87–5.11)
RBC: 4.29 MIL/uL (ref 3.87–5.11)
RDW: 15.7 % — ABNORMAL HIGH (ref 11.5–15.5)
RDW: 15.8 % — ABNORMAL HIGH (ref 11.5–15.5)
WBC: 14.4 10*3/uL — ABNORMAL HIGH (ref 4.0–10.5)
WBC: 15.4 10*3/uL — ABNORMAL HIGH (ref 4.0–10.5)
nRBC: 0.3 % — ABNORMAL HIGH (ref 0.0–0.2)
nRBC: 0.7 % — ABNORMAL HIGH (ref 0.0–0.2)

## 2022-06-27 LAB — GLUCOSE, CAPILLARY
Glucose-Capillary: 112 mg/dL — ABNORMAL HIGH (ref 70–99)
Glucose-Capillary: 69 mg/dL — ABNORMAL LOW (ref 70–99)
Glucose-Capillary: 88 mg/dL (ref 70–99)
Glucose-Capillary: 98 mg/dL (ref 70–99)
Glucose-Capillary: 100 mg/dL — ABNORMAL HIGH (ref 70–99)
Glucose-Capillary: 84 mg/dL (ref 70–99)
Glucose-Capillary: 89 mg/dL (ref 70–99)
Glucose-Capillary: 103 mg/dL — ABNORMAL HIGH (ref 70–99)

## 2022-06-27 LAB — BLOOD GAS, VENOUS
Acid-Base Excess: 2 mmol/L (ref 0.0–2.0)
Acid-Base Excess: 1.7 mmol/L (ref 0.0–2.0)
Bicarbonate: 30.5 mmol/L — ABNORMAL HIGH (ref 20.0–28.0)
Bicarbonate: 27.7 mmol/L (ref 20.0–28.0)
O2 Saturation: 82.3 %
O2 Saturation: 92.4 %
Patient temperature: 37
Patient temperature: 37
pCO2, Ven: 65 mmHg — ABNORMAL HIGH (ref 44–60)
pCO2, Ven: 49 mmHg (ref 44–60)
pH, Ven: 7.28 (ref 7.25–7.43)
pH, Ven: 7.36 (ref 7.25–7.43)
pO2, Ven: 53 mmHg — ABNORMAL HIGH (ref 32–45)
pO2, Ven: 60 mmHg — ABNORMAL HIGH (ref 32–45)

## 2022-06-27 LAB — HEPARIN LEVEL (UNFRACTIONATED)
Heparin Unfractionated: 0.35 IU/mL (ref 0.30–0.70)
Heparin Unfractionated: 0.39 IU/mL (ref 0.30–0.70)

## 2022-06-27 LAB — COMPREHENSIVE METABOLIC PANEL
ALT: 342 U/L — ABNORMAL HIGH (ref 0–44)
AST: 169 U/L — ABNORMAL HIGH (ref 15–41)
Albumin: 3.5 g/dL (ref 3.5–5.0)
Alkaline Phosphatase: 69 U/L (ref 38–126)
Anion gap: 16 — ABNORMAL HIGH (ref 5–15)
BUN: 31 mg/dL — ABNORMAL HIGH (ref 8–23)
CO2: 25 mmol/L (ref 22–32)
Calcium: 8.9 mg/dL (ref 8.9–10.3)
Chloride: 93 mmol/L — ABNORMAL LOW (ref 98–111)
Creatinine, Ser: 5.86 mg/dL — ABNORMAL HIGH (ref 0.44–1.00)
GFR, Estimated: 7 mL/min — ABNORMAL LOW (ref >60)
Glucose, Bld: 100 mg/dL — ABNORMAL HIGH (ref 70–99)
Potassium: 4 mmol/L (ref 3.5–5.1)
Sodium: 134 mmol/L — ABNORMAL LOW (ref 135–145)
Total Bilirubin: 0.8 mg/dL (ref 0.3–1.2)
Total Protein: 6.6 g/dL (ref 6.5–8.1)

## 2022-06-27 MED ORDER — DEXTROSE 50 % IV SOLN: 25.0000 g | INTRAVENOUS | Status: AC

## 2022-06-27 MED ORDER — LORAZEPAM 2 MG/ML IJ SOLN: 0.5000 mg | Freq: Once | INTRAMUSCULAR | Status: DC

## 2022-06-27 MED ORDER — SODIUM CHLORIDE 0.9% FLUSH: 10.0000 mL | INTRAVENOUS | Status: DC | PRN

## 2022-06-27 MED ORDER — DEXTROSE 10 % IV SOLN: INTRAVENOUS | Status: DC

## 2022-06-27 MED ORDER — DEXTROSE 50 % IV SOLN
INTRAVENOUS | Status: AC
  Administered 2022-06-27: 50 mL
  Filled 2022-06-27: qty 50

## 2022-06-27 MED ORDER — SODIUM CHLORIDE 0.9% FLUSH
10.0000 mL | Freq: Two times a day (BID) | INTRAVENOUS | Status: DC
  Administered 2022-06-27 – 2022-07-08 (×19): 10 mL

## 2022-06-27 NOTE — Plan of Care (Signed)
  Problem: Education: Goal: Knowledge of General Education information will improve Description: Including pain rating scale, medication(s)/side effects and non-pharmacologic comfort measures 06/27/2022 0042 by Zadie Rhine, RN Outcome: Progressing 06/27/2022 0040 by Zadie Rhine, RN Outcome: Progressing   Problem: Health Behavior/Discharge Planning: Goal: Ability to manage health-related needs will improve 06/27/2022 0042 by Zadie Rhine, RN Outcome: Progressing 06/27/2022 0040 by Zadie Rhine, RN Outcome: Progressing   Problem: Clinical Measurements: Goal: Ability to maintain clinical measurements within normal limits will improve 06/27/2022 0042 by Zadie Rhine, RN Outcome: Progressing 06/27/2022 0040 by Zadie Rhine, RN Outcome: Progressing Goal: Will remain free from infection 06/27/2022 0042 by Zadie Rhine, RN Outcome: Progressing 06/27/2022 0040 by Zadie Rhine, RN Outcome: Progressing Goal: Diagnostic test results will improve Outcome: Progressing

## 2022-06-27 NOTE — Consult Note (Signed)
Consultation Note Date: 06/27/2022   Patient Name: Kathleen Sharp  DOB: Mar 28, 1946  MRN: [id number]  Age / Sex: 76 y.o., female  PCP: Adalberto Ill, MD Referring Physician: Carlyle Lipa, MD  Reason for Consultation: Establishing goals of care  HPI/Patient Profile: 76 y.o. female  with past medical history of ESRD on HD, HTN/HLD, cardiomegaly, arthritis, AV fistula placement 2017, former smoker admitted on 06/27/2022 with NSTEMI.   Clinical Assessment and Goals of Care: I have reviewed medical records including EPIC notes, labs and imaging, received report from RN, assessed the patient.  Kathleen Sharp is sitting up in bed.  She appears acutely/chronically ill and somewhat frail, obese.  She had worsening mental status and respiratory distress overnight and is now on BiPAP.  She will briefly open her eyes when I ask, but not make eye contact.  It is clear that she cannot make her basic needs known.  Her nephew's Audry Pili who is a Clinical biochemist and Aaron Edelman  We meet at the bedside to discuss diagnosis prognosis, [place], EOL wishes, disposition and options.  I introduced Palliative Medicine as specialized medical care for people living with serious illness. It focuses on providing relief from the symptoms and stress of a serious illness. The goal is to improve quality of life for both the patient and the family.  We discussed a brief life review of the patient.  Kathleen Sharp is a widow.  She has no children.  She has multiple nieces and nephews.  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  Prior to this hospital stay she has been independent with ADLs/IADLs.  We then focused on their current illness.  We talk about NSTEMI and the treatment plan.  We talk about BiPAP and respiratory distress, CO2.  We talk about time for outcomes.  The natural disease trajectory and expectations at EOL were  discussed.  Advanced directives, concepts specific to code status, artifical feeding and hydration, and rehospitalization were considered and discussed.  Full scope/full code at this time.  Discussed the importance of continued conversation with family and the medical providers regarding overall plan of care and treatment options, ensuring decisions are within the context of the patient's values and GOCs.  Questions and concerns were addressed.  The family was encouraged to call with questions or concerns.  PMT will continue to support holistically.  Conference with attending, bedside nursing staff, cardiology, transition of care team related to patient condition, needs, goals of care, disposition.   HCPOA NEXT OF KIN -nephews Audry Pili and Aaron Edelman are at bedside.  They share that Mrs. Kathleen Sharp is unmarried, and has no children.  They share that she does not have a healthcare power of attorney.  They state that the family will make choices as a team if she cannot.  SUMMARY OF RECOMMENDATIONS   At this point continue full scope/full code Time for outcomes Hopeful to stabilize for cardiac cath. Anticipate need for short-term rehab   Code Status/Advance Care Planning: Full code  Symptom Management:  Per hospitalist, no additional needs at this time.  Palliative Prophylaxis:  Frequent Pain Assessment, Oral Care, and Turn Reposition  Additional Recommendations (Limitations, Scope, Preferences): Full Scope Treatment  Psycho-social/Spiritual:  Desire for further Chaplaincy support:no Additional Recommendations: Caregiving  Support/Resources  Prognosis:  Unable to determine, based on outcomes.  Guarded at this point.  Discharge Planning: To be determined, based on outcomes.      Primary Diagnoses: Present on Admission:  Volume overload  Essential hypertension  End stage renal disease (HCC)  Anemia of chronic renal failure, stage 5 (HCC)  Hyperkalemia  Pulmonary edema  NSTEMI (non-ST  elevated myocardial infarction) ([place])   I have reviewed the medical record, interviewed the patient and family, and examined the patient. The following aspects are pertinent.  Past Medical History:  Diagnosis Date   Anemia    Arthritis    Chronic kidney disease    Chronic Kidney Disease   Hypercholesterolemia    Hypertension    Social History   Socioeconomic History   Marital status: Widowed    Spouse name: Not on file   Number of children: Not on file   Years of education: Not on file   Highest education level: Not on file  Occupational History   Not on file  Tobacco Use   Smoking status: Former    Packs/day: 1.00    Years: 15.00    Total pack years: 15.00    Types: Cigarettes    Quit date: 09/16/2012    Years since quitting: 9.7   Smokeless tobacco: Never  Substance and Sexual Activity   Alcohol use: No    Alcohol/week: 0.0 standard drinks of alcohol   Drug use: No   Sexual activity: Not Currently  Other Topics Concern   Not on file  Social History Narrative   Not on file   Social Determinants of Health   Financial Resource Strain: Not on file  Food Insecurity: Not on file  Transportation Needs: Not on file  Physical Activity: Not on file  Stress: Not on file  Social Connections: Not on file   Family History  Problem Relation Age of Onset   Kidney disease Father    Heart disease Mother    Kidney disease Brother    Scheduled Meds:  aspirin  81 mg Oral Pre-Cath   atorvastatin  20 mg Oral q1800   carvedilol  6.25 mg Oral BID WC   Chlorhexidine Gluconate Cloth  6 each Topical Q0600   clopidogrel  75 mg Oral Daily   sevelamer carbonate  1,600 mg Oral TID WC   sodium chloride flush  3 mL Intravenous Q12H   Continuous Infusions:  sodium chloride     sodium chloride Stopped (06/27/22 0300)   amiodarone 30 mg/hr (06/26/22 0439)   dextrose 30 mL/hr at 06/27/22 0605   diltiazem (CARDIZEM) infusion Stopped (06/24/22 1104)   heparin 1,150 Units/hr (06/26/22  2025)   PRN Meds:.sodium chloride, acetaminophen **OR** acetaminophen, hydrALAZINE, ondansetron **OR** ondansetron (ZOFRAN) IV, senna-docusate, sodium chloride flush Medications Prior to Admission:  Prior to Admission medications   Medication Sig Start Date End Date Taking? Authorizing Provider  carvedilol (COREG) 6.25 MG tablet Take 6.25 mg by mouth 2 (two) times daily with a meal.   Yes [provider]  clopidogrel (PLAVIX) 75 MG tablet Take 75 mg by mouth daily.   Yes [provider]  lidocaine-prilocaine (EMLA) cream Apply 1 Application topically daily as needed (dialysis treatment).   Yes [provider]  lisinopril (ZESTRIL) 40 MG tablet Take 40 mg by mouth daily.   Yes [provider]  loratadine (CLARITIN) 10 MG tablet Take 10 mg by mouth daily.   Yes [provider]  Multiple Vitamins-Minerals (MULTIVITAMIN WITH MINERALS) tablet Take 1 tablet by mouth daily.   Yes [provider]  sevelamer carbonate (RENVELA) 800 MG tablet Take 1,600 mg by mouth 3 (three) times daily with meals.   Yes [provider]   No Known Allergies Review of Systems  Unable to perform ROS: Severe respiratory distress    Physical Exam Vitals and nursing note reviewed.  Constitutional:      General: She is not in acute distress.    Appearance: She is obese. She is ill-appearing.  Cardiovascular:     Rate and Rhythm: Normal rate.  Pulmonary:     Comments: BiPAP Neurological:     Comments: BiPAP     Vital Signs: BP 117/63 (BP Location: Right Arm)   Pulse 63   Temp 97.7 F (36.5 C) (Oral)   Resp 20   Ht $R'5\' 2"'Jb$  (1.575 m)   Wt 83.6 kg   SpO2 100%   BMI 33.71 kg/m  Pain Scale: Faces   Pain Score: Asleep   SpO2: SpO2: 100 % O2 Device:SpO2: 100 % O2 Flow Rate: .O2 Flow Rate (L/min): 2 L/min  IO: Intake/output summary:  Intake/Output Summary (Last 24 hours) at 06/27/2022 1231 Last data filed at 06/27/2022 7782 Gross per 24 hour   Intake 901.04 ml  Output 2000 ml  Net -1098.96 ml    LBM: Last BM Date : 06/24/22 Baseline Weight: Weight: 82.1 kg Most recent weight: Weight: 83.6 kg     Palliative Assessment/Data:   Flowsheet Rows    Flowsheet Row Most Recent Value  Intake Tab   Referral Department Hospitalist  Unit at Time of Referral Other (Comment)  Palliative Care Primary Diagnosis Cardiac  Date Notified 06/26/22  Palliative Care Type New Palliative care  Reason for referral Clarify Goals of Care  Date of Admission 06/25/2022  Date first seen by Palliative Care 06/27/22  # of days Palliative referral response time 1 Day(s)  # of days IP prior to Palliative referral 3  Clinical Assessment   Palliative Performance Scale Score 20%  Pain Max last 24 hours Not able to report  Pain Min Last 24 hours Not able to report  Dyspnea Max Last 24 Hours Not able to report  Dyspnea Min Last 24 hours Not able to report  Psychosocial & Spiritual Assessment   Palliative Care Outcomes        Time In: 0840 Time Out: 0935 Time Total: 55 minutes Greater than 50%  of this time was spent counseling and coordinating care related to the above assessment and plan.  Signed by: Drue Novel, NP   Please contact Palliative Medicine Team phone at [phone] for questions and concerns.  For individual provider: See Shea Evans

## 2022-06-27 NOTE — Progress Notes (Signed)
   06/27/22 0100  Clinical Encounter Type  Visited With Patient  Visit Type Initial  Referral From Nurse  Consult/Referral To Chaplain   Chaplain responded to rapid. Chaplain spoke with nurse who shared family is not present. Chaplain services are available for follow up as needed.

## 2022-06-27 NOTE — Progress Notes (Signed)
Niece [name] called again to check on pt. Pt now sleeping peacefully without distress. Occasionally reminder SRP, RN

## 2022-06-27 NOTE — Progress Notes (Addendum)
[facility]  ROUNDING NOTE   Subjective:   Kathleen Sharp is a 76 year old female with past medical conditions including hyperlipidemia, hypertension, cardiomegaly, and end-stage renal disease on hemodialysis.  Patient presents to the emergency department with complaints of shortness of breath and has been admitted for Hyperkalemia [E87.5] SOB (shortness of breath) [R06.02] Tachyarrhythmia [R00.0] Volume overload [E87.70]  Patient is known to our practice and receives outpatient dialysis treatments at [facility] on a MWF schedule, supervised by Dr. Thedore Mins.   Patient seen resting quietly, nephew at bedside Alert, unable to answer questions or follow commands Safety sitter at bedside No lower extremity edema  Objective:  Vital signs in last 24 hours:  Temp:  [97.7 F (36.5 C)-98.9 F (37.2 C)] 97.7 F (36.5 C) (10/12 1141) Pulse Rate:  [60-70] 63 (10/12 1141) Resp:  [14-24] 20 (10/12 1141) BP: (102-152)/(62-82) 117/63 (10/12 1141) SpO2:  [94 %-100 %] 100 % (10/12 1141) FiO2 (%):  [50 %] 50 % (10/12 1020) Weight:  [83.6 kg] 83.6 kg (10/11 1342)  Weight change: 1.998 kg Filed Weights   06/24/22 1343 06/26/22 0604 06/26/22 1342  Weight: 82.1 kg 81.6 kg 83.6 kg    Intake/Output: I/O last 3 completed shifts: In: 901 [I.V.:901] Out: 2000 [Other:2000]   Intake/Output this shift:  No intake/output data recorded.  Physical Exam: General: NAD  Head: Normocephalic, atraumatic. Moist oral mucosal membranes  Eyes: Anicteric  Lungs:  Diminished in bases, normal effort, room air  Heart: Regular rate and rhythm  Abdomen:  Soft, nontender, obese  Extremities: No peripheral edema.  Neurologic: Nonfocal, moving all four extremities  Skin: No lesions  Access: Left AVG    Basic Metabolic Panel: Recent Labs  Lab 06/24/22 0953 06/24/22 1330 06/24/22 2127 06/25/22 0442 06/26/22 0621 06/27/22 1045  NA 137  --  137 135 134* 134*  K 6.2* 3.0* 4.9 4.8  5.0 4.0  CL 99  --  90* 93* 92* 93*  CO2 21*  --  24 28 25 25   GLUCOSE 72  --  137* 97 84 100*  BUN 76*  --  39* 45* 64* 31*  CREATININE 10.67*  --  6.68* 7.34* 8.41* 5.86*  CALCIUM 8.8*  --  8.6* 8.4* 8.6* 8.9  MG  --   --  1.8  --   --   --   PHOS 7.5*  --   --   --   --   --      Liver Function Tests: Recent Labs  Lab 06/24/22 0537 06/24/22 0953 06/24/22 2127 06/27/22 1045  AST 144*  --  144* 169*  ALT 77*  --  118* 342*  ALKPHOS 59  --  56 69  BILITOT 0.8  --  0.9 0.8  PROT 6.9  --  6.7 6.6  ALBUMIN 3.6 3.6 3.4* 3.5    No results for input(s): "LIPASE", "AMYLASE" in the last 168 hours. No results for input(s): "AMMONIA" in the last 168 hours.  CBC: Recent Labs  Lab 07/07/2022 1615 06/26/2022 2350 06/24/22 2127 06/25/22 0442 06/26/22 0621 06/27/22 0525 06/27/22 1045  WBC 5.9   < > 12.3* 14.8* 17.3* 14.4* 15.4*  NEUTROABS 5.2  --   --   --   --   --   --   HGB 11.8*   < > 10.9* 10.6* 10.6* 9.5* 10.7*  HCT 38.5   < > 34.9* 33.6* 33.2* 30.1* 33.8*  MCV 82.4   < > 80.0 79.8* 79.6* 79.2*  78.8*  PLT 269   < > 242 230 222 201 230   < > = values in this interval not displayed.     Cardiac Enzymes: No results for input(s): "CKTOTAL", "CKMB", "CKMBINDEX", "TROPONINI" in the last 168 hours.  BNP: Invalid input(s): "POCBNP"  CBG: Recent Labs  Lab 06/27/22 0131 06/27/22 0142 06/27/22 0415 06/27/22 0850 06/27/22 1138  GLUCAP 69* 112* 88 98 100*     Microbiology: Results for orders placed or performed during the hospital encounter of 07/07/2022  Resp Panel by RT-PCR (Flu A&B, Covid) Anterior Nasal Swab     Status: None   Collection Time: 07/03/2022  5:13 PM   Specimen: Anterior Nasal Swab  Result Value Ref Range Status   SARS Coronavirus 2 by RT PCR NEGATIVE NEGATIVE Final    Comment: (NOTE) SARS-CoV-2 target nucleic acids are NOT DETECTED.  The SARS-CoV-2 RNA is generally detectable in upper respiratory specimens during the acute phase of infection. The  lowest concentration of SARS-CoV-2 viral copies this assay can detect is 138 copies/mL. A negative result does not preclude SARS-Cov-2 infection and should not be used as the sole basis for treatment or other patient management decisions. A negative result may occur with  improper specimen collection/handling, submission of specimen other than nasopharyngeal swab, presence of viral mutation(s) within the areas targeted by this assay, and inadequate number of viral copies(<138 copies/mL). A negative result must be combined with clinical observations, patient history, and epidemiological information. The expected result is Negative.  Fact Sheet for Patients:  EntrepreneurPulse.com.au  Fact Sheet for Healthcare Providers:  IncredibleEmployment.be  This test is no t yet approved or cleared by the [place] FDA and  has been authorized for detection and/or diagnosis of SARS-CoV-2 by FDA under an Emergency Use Authorization (EUA). This EUA will remain  in effect (meaning this test can be used) for the duration of the COVID-19 declaration under Section 564(b)(1) of the Act, 21 U.S.C.section 360bbb-3(b)(1), unless the authorization is terminated  or revoked sooner.       Influenza A by PCR NEGATIVE NEGATIVE Final   Influenza B by PCR NEGATIVE NEGATIVE Final    Comment: (NOTE) The Xpert Xpress SARS-CoV-2/FLU/RSV plus assay is intended as an aid in the diagnosis of influenza from Nasopharyngeal swab specimens and should not be used as a sole basis for treatment. Nasal washings and aspirates are unacceptable for Xpert Xpress SARS-CoV-2/FLU/RSV testing.  Fact Sheet for Patients: EntrepreneurPulse.com.au  Fact Sheet for Healthcare Providers: IncredibleEmployment.be  This test is not yet approved or cleared by the [place] FDA and has been authorized for detection and/or diagnosis of SARS-CoV-2 by FDA under  an Emergency Use Authorization (EUA). This EUA will remain in effect (meaning this test can be used) for the duration of the COVID-19 declaration under Section 564(b)(1) of the Act, 21 U.S.C. section 360bbb-3(b)(1), unless the authorization is terminated or revoked.  Performed at [facility], [address] [place], [place] [address]   MRSA Next Gen by PCR, Nasal     Status: None   Collection Time: 06/24/22  4:49 PM   Specimen: Nasal Mucosa; Nasal Swab  Result Value Ref Range Status   MRSA by PCR Next Gen NOT DETECTED NOT DETECTED Final    Comment: (NOTE) The GeneXpert MRSA Assay (FDA approved for NASAL specimens only), is one component of a comprehensive MRSA colonization surveillance program. It is not intended to diagnose MRSA infection nor to guide or monitor treatment for MRSA infections. Test performance  is not FDA approved in patients less than 33 years old. Performed at [facility], [address] [place], [place] [address]     Coagulation Studies: No results for input(s): "LABPROT", "INR" in the last 72 hours.   Urinalysis: No results for input(s): "COLORURINE", "LABSPEC", "PHURINE", "GLUCOSEU", "HGBUR", "BILIRUBINUR", "KETONESUR", "PROTEINUR", "UROBILINOGEN", "NITRITE", "LEUKOCYTESUR" in the last 72 hours.  Invalid input(s): "APPERANCEUR"    Imaging: No results found.   Medications:    sodium chloride     sodium chloride Stopped (06/27/22 0300)   amiodarone 30 mg/hr (06/26/22 0439)   dextrose 30 mL/hr at 06/27/22 0605   diltiazem (CARDIZEM) infusion Stopped (06/24/22 1104)   heparin 1,150 Units/hr (06/26/22 2025)    aspirin  81 mg Oral Pre-Cath   atorvastatin  20 mg Oral q1800   carvedilol  6.25 mg Oral BID WC   Chlorhexidine Gluconate Cloth  6 each Topical Q0600   clopidogrel  75 mg Oral Daily   sevelamer carbonate  1,600 mg Oral TID WC   sodium chloride flush  3 mL Intravenous Q12H   sodium chloride, acetaminophen **OR**  acetaminophen, hydrALAZINE, ondansetron **OR** ondansetron (ZOFRAN) IV, senna-docusate, sodium chloride flush  Assessment/ Plan:  Ms. Kathleen Sharp is a 75 y.o.  female with past medical conditions including hyperlipidemia, hypertension, cardiomegaly, and end-stage renal disease on hemodialysis.  Patient presents to the emergency department with complaints of shortness of breath and has been admitted for Hyperkalemia [E87.5] SOB (shortness of breath) [R06.02] Tachyarrhythmia [R00.0] Volume overload [E87.70]  [facility]/M/F/left AVG/82 kg  Volume overload/hyperkalemia with end-stage renal disease on hemodialysis.  Potassium on ED arrival 5.9, increased to 6.1 after pharmacologic treatment.  Chest x-ray shows moderate pulmonary edema with bilateral pleural effusions.    Dialysis received yesterday, UF 2L achieved. Next treatment scheduled for Friday. Potassium corrected 4.0. Will order UA with culture to evaluate for UTI.   2. Anemia of chronic kidney disease Lab Results  Component Value Date   HGB 10.7 (L) 06/27/2022    Hemoglobin within desired target  3. Secondary Hyperparathyroidism: with outpatient labs: PTH 798, phosphorus 8.5, calcium 8.0 on 04/29/22.   Lab Results  Component Value Date   CALCIUM 8.9 06/27/2022   PHOS 7.5 (H) 06/24/2022    Will continue to monitor bone minerals. Phosphorus remains elevated.  Continue sevelamer ordered with meals.  4.  Hypertension with chronic kidney disease.  Home regimen includes carvedilol and lisinopril.  Remains on receiving carvedilol and amiodarone drip.  Blood pressure stable.  5.  NSTEMI with elevated troponin and ST depression with SVT on EKG.  Cardiology plans for cardiac cath today.    LOS: 3   10/12/20231:03 PM

## 2022-06-27 NOTE — Significant Event (Signed)
Rapid Response Event Note   Reason for Call :  SOB AMS Initial Focused Assessment:  Patient Drowsy but able to follow simple commands. Per primary nurse patient drowsyness is not new her concern was patients random loud outbursts. Patient with increase work of breathing. Rhonchi sounds heard through out upper lobes and diminished lower lobes sounds Vital signs  as followed temp 98.4 BP 115/64 HR 66 RR 24 O2 97% on 2L. Patient CBG was found to be 69. Patient became more alert after amp of D5 Given. CBG 112 Following amp of D5     Interventions:  -VBG ordered -Bipap Ordered -D5 given and continuous drip of dextrose 10% ordered.   Follow up- Sharon- Conversation with primary nurse sophia patient is more alert with staff and work of breathing has improved.   MD Notified:  Sharion Settler Call [place] [phone] End [id number]  Gonzella Lex, RN

## 2022-06-27 NOTE — Progress Notes (Signed)
ANTICOAGULATION CONSULT NOTE  Pharmacy Consult for heparin infusion Indication: ACS/STEMI  No Known Allergies  Patient Measurements: Height: 5\' 2"  (157.5 cm) Weight: 83.6 kg (184 lb 4.9 oz) IBW/kg (Calculated) : 50.1 Heparin Dosing Weight: 68.5 kg  Vital Signs: Temp: 98.4 F (36.9 C) (10/12 0312) Temp Source: Oral (10/12 0312) BP: 143/82 (10/12 0312) Pulse Rate: 69 (10/12 0312)  Labs: Recent Labs    06/24/22 2127 06/24/22 2349 06/25/22 0223 06/25/22 0442 06/25/22 1439 06/25/22 2219 06/26/22 0621 06/27/22 0525  HGB 10.9*  --   --  10.6*  --   --  10.6* 9.5*  HCT 34.9*  --   --  33.6*  --   --  33.2* 30.1*  PLT 242  --   --  230  --   --  222 201  HEPARINUNFRC  --   --   --  0.28*   < > 0.49 0.64 0.35  CREATININE 6.68*  --   --  7.34*  --   --  8.41*  --   TROPONINIHS 11,276* 12,073* 11,321*  --   --   --   --   --    < > = values in this interval not displayed.     Estimated Creatinine Clearance: 5.9 mL/min (A) (by C-G formula based on SCr of 8.41 mg/dL (H)).   Medical History: Past Medical History:  Diagnosis Date   Anemia    Arthritis    Chronic kidney disease    Chronic Kidney Disease   Hypercholesterolemia    Hypertension   Heparin Dosing Weight: 68.5 kg  Assessment: Pt is a 76 yo female presenting to ED w/ SOB found with elevated troponin I level, trending up  Goal of Therapy:  Heparin level 0.3-0.7 units/ml Monitor platelets by anticoagulation protocol: Yes  Date Time HL Rate/Comment 10/9 0953 0.35 Therapeutic x1 10/9 1753 0.24 Subtherapeutic; 850>1000 un/hr 10/10  0442 0.28 Subtherapeutic; 1000 > 1150 un/hr 10/10 1439 0.51 Therapeutic x1 10/10 2219 0.49 Thera x2; 1150 un/hr 10/11 0621 0.64 Thera x2; 1150 un/hr 10/12   0525   0.35     Therapeutic X 4  Hgb 12.1>10.9>10.6; Plts 232>230 Trop 14k>13.3k>12.1k>11.3k  Plan:  10/12:  HL @ 0525 = 0.35, HL therapeutic X 4  Continue heparin infusion at 1,150 units/hr Check HL daily with AM labs  since consecutively therapeutic. Continue to monitor H&H and platelets daily while on heparin gtt.   Meoshia Billing D Clinical Pharmacist 06/27/2022 7:14 AM

## 2022-06-27 NOTE — Progress Notes (Signed)
Peripherally Inserted Central Catheter Placement  The IV Nurse has discussed with the patient and/or persons authorized to consent for the patient, the purpose of this procedure and the potential benefits and risks involved with this procedure.  The benefits include less needle sticks, lab draws from the catheter, and the patient may be discharged home with the catheter. Risks include, but not limited to, infection, bleeding, blood clot (thrombus formation), and puncture of an artery; nerve damage and irregular heartbeat and possibility to perform a PICC exchange if needed/ordered by physician.  Alternatives to this procedure were also discussed.  Bard Power PICC patient education guide, fact sheet on infection prevention and patient information card has been provided to patient /or left at bedside.  Telephone consent obtained from [name] niece due to altered mental status.  PICC Placement Documentation  PICC Triple Lumen 16/07/37 Right Basilic 42 cm 1 cm (Active)  Indication for Insertion or Continuance of Line Limited venous access - need for IV therapy >5 days (PICC only);Vasoactive infusions 06/27/22 1448  Exposed Catheter (cm) 1 cm 06/27/22 1448  Site Assessment Clean, Dry, Intact 06/27/22 1448  Lumen #1 Status Flushed;Saline locked;Blood return noted 06/27/22 1448  Lumen #2 Status Flushed;Saline locked;Blood return noted 06/27/22 1448  Lumen #3 Status Flushed;Saline locked;Blood return noted 06/27/22 1448  Dressing Type Transparent;Securing device 06/27/22 1448  Dressing Status Antimicrobial disc in place;Clean, Dry, Intact 06/27/22 1448  Safety Lock Not Applicable 10/62/69 4854  Line Care Connections checked and tightened 06/27/22 1448  Line Adjustment (NICU/IV Team Only) No 06/27/22 1448  Dressing Intervention New dressing 06/27/22 1448  Dressing Change Due 06/29/2022 06/27/22 [place], Nicolette Bang 06/27/2022, 2:49 PM

## 2022-06-27 NOTE — Plan of Care (Signed)

## 2022-06-27 NOTE — Progress Notes (Signed)
Placed PT on BIPAP 12/5, post VBG results. Tolerating well.

## 2022-06-27 NOTE — Progress Notes (Signed)
Mittens off, RN at bedside , Bipap continues, pt resting and more alert, able to say" thank you softly". Pt calmer and labored breathing improved, no abd movement on inhalation and exhalation. Respond to spoken words, will cont to monitor. SRP RN

## 2022-06-27 NOTE — Progress Notes (Addendum)
SUBJECTIVE: Patient is a 76 year old female with a history of ESRD on dialysis MWF, hypertension, cardiomegaly, hyperlipidemia, who presented to the ED on 07/15/2022 with shortness of breath, palpitations. Denied chest pain at admission.     On 10/10, patient had an episode of nonsustained ventricular tachycardia followed by atrial fibrillation associated with dizziness. Patient denies losing consciousness. IV amiodoarone started.   Patient very lethargic. Unable to answer questions or follow commands.    Vitals:   06/27/22 0155 06/27/22 0227 06/27/22 0312 06/27/22 0812  BP: 115/64  (!) 143/82 139/73  Pulse: 66  69 63  Resp: (!) $RemoveB'24  14 18  'MNmVVxnJ$ Temp: 98.4 F (36.9 C) 98.8 F (37.1 C) 98.4 F (36.9 C) 97.8 F (36.6 C)  TempSrc: Oral Rectal Oral Oral  SpO2: 97% 99% 99% 95%  Weight:      Height:        Intake/Output Summary (Last 24 hours) at 06/27/2022 0857 Last data filed at 06/27/2022 6808 Gross per 24 hour  Intake 901.04 ml  Output 2000 ml  Net -1098.96 ml    LABS: Basic Metabolic Panel: Recent Labs    06/24/22 0953 06/24/22 1330 06/24/22 2127 06/25/22 0442 06/26/22 0621  NA 137  --  137 135 134*  K 6.2*   < > 4.9 4.8 5.0  CL 99  --  90* 93* 92*  CO2 21*  --  $R'24 28 25  'QT$ GLUCOSE 72  --  137* 97 84  BUN 76*  --  39* 45* 64*  CREATININE 10.67*  --  6.68* 7.34* 8.41*  CALCIUM 8.8*  --  8.6* 8.4* 8.6*  MG  --   --  1.8  --   --   PHOS 7.5*  --   --   --   --    < > = values in this interval not displayed.   Liver Function Tests: Recent Labs    06/24/22 0953 06/24/22 2127  AST  --  144*  ALT  --  118*  ALKPHOS  --  56  BILITOT  --  0.9  PROT  --  6.7  ALBUMIN 3.6 3.4*   No results for input(s): "LIPASE", "AMYLASE" in the last 72 hours. CBC: Recent Labs    06/26/22 0621 06/27/22 0525  WBC 17.3* 14.4*  HGB 10.6* 9.5*  HCT 33.2* 30.1*  MCV 79.6* 79.2*  PLT 222 201   Cardiac Enzymes: No results for input(s): "CKTOTAL", "CKMB", "CKMBINDEX", "TROPONINI" in the  last 72 hours. BNP: Invalid input(s): "POCBNP" D-Dimer: No results for input(s): "DDIMER" in the last 72 hours. Hemoglobin A1C: No results for input(s): "HGBA1C" in the last 72 hours. Fasting Lipid Panel: No results for input(s): "CHOL", "HDL", "LDLCALC", "TRIG", "CHOLHDL", "LDLDIRECT" in the last 72 hours. Thyroid Function Tests: No results for input(s): "TSH", "T4TOTAL", "T3FREE", "THYROIDAB" in the last 72 hours.  Invalid input(s): "FREET3" Anemia Panel: No results for input(s): "VITAMINB12", "FOLATE", "FERRITIN", "TIBC", "IRON", "RETICCTPCT" in the last 72 hours.   PHYSICAL EXAM General: Well developed, well nourished, in no acute distress HEENT:  Normocephalic and atramatic Neck:  No JVD.  Lungs: Clear bilaterally to auscultation and percussion. Heart: HRRR . Normal S1 and S2 without gallops or murmurs.  Abdomen: Bowel sounds are positive, abdomen soft and non-tender  Msk:  Back normal, normal gait. Normal strength and tone for age. Extremities: No clubbing, cyanosis or edema.   Neuro: somnolent, altered me tal status Psych:  judgment and insight appears impaired.  TELEMETRY: sinus  rhythm  ASSESSMENT AND PLAN: Patient very lethargic, unable to answer questions. Patient developed sudden mental status changes over night, acting erratic. BiPAP was placed, patient was pulling and refusing. Patient was scheduled for cardiac cath this morning. Will cancel cardiac cath until patient's mental status improves. Will continue to follow.  Principal Problem:   NSTEMI (non-ST elevated myocardial infarction) ([place]) Active Problems:   Anemia of chronic renal failure, stage 5 (HCC)   End stage renal disease (HCC)   Essential hypertension   Volume overload   Hyperkalemia   Pulmonary edema   Cardiac arrhythmia   Syncope and collapse    Julia Kulzer, FNP-C 06/27/2022 8:57 AM

## 2022-06-27 NOTE — Progress Notes (Addendum)
   Patient: Kathleen Sharp WUJ:[id number] DOB: [birth year]   Cross Cover Called to bedside early in shift approx 2130or nurse reports change in mental status  Bedside patient lethargic without overt focal neuro deficits. Nurse concerned due to periods of "agitation"  Cbg 74, and vbg ordered Rapid called  approx 128 AM with same report CBG 69. VBG 7.28. pCO2 65 with HCO3 30 RR 30 with congestion per rapid nurse  Patient with known pulmonary edema with significant BNP eevation placed on BIPAP to help work of breathing. D10 infusion also initiated to prevent further hypoglycemia  Kathlene Cote NP [facility]  AM update - patient requiring sitter to keep bipap in place

## 2022-06-27 NOTE — Progress Notes (Signed)
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 SRP, RN

## 2022-06-27 NOTE — Progress Notes (Signed)
Progress Note   Patient: Kathleen Sharp LEX:[id number] DOB: [birth year] DOA: 07/14/2022     3 DOS: the patient was seen and examined on 06/27/2022   Brief hospital course: Ms. Kathleen Sharp is a 76 year old female with history of end-stage renal disease on hemodialysis Monday Wednesday 2022/12/11, hypertension, cardiomegaly, hyperlipidemia, who presents emergency department for chief concerns of shortness of breath.  Patient was complaining of shortness of breath with associated flushed face and palpitations.  No chest pain, nausea or vomiting.  Initial vitals in the emergency department showed temperature of 97.5, respiration rate of 29, heart rate of 95, blood pressure 108/87, SPO2 of 96% on 4 L nasal cannula.  Serum sodium is 135, potassium 5.9, chloride of 97, bicarb 20, BUN of 63, serum creatinine of 9.33, GFR 4, nonfasting blood glucose 172, WBC 5.9, hemoglobin 11.8, platelets of 269.  Lactic acid is 4.1.  High sensitive troponin is 89.  COVID/influenza A/influenza B PCR negative.  ED treatment: Diltiazem 10 mg IV one-time dose, Lokelma p.o., insulin aspart 10 mg, D50, calcium gluconate, vancomycin and cefepime.  10/9: Patient overnight had another episode of dyspnea with flushed face and palpitations.  Found to have supraventricular tachycardia requiring IV diltiazem push.  Troponin increased to above 10,000, peaked at 14,207>>13,306.  Potassium at 6.1, BUN 17, creatinine 10.41, it yesterday 144, ALT 77 and anion gap of 16.  BNP >4500, D-dimer 2.66>>3.10. EKG with SVT, peaked T in anteroseptal and ST depression in lateral leads. Patient was started on heparin infusion by night on-call, cardiology and nephrology was consulted.  Going for dialysis this morning. Per patient she never missed any dialysis, last dialysis was on Saturday as she was unable to go for her 12/11/2022 session due to the death of a dear friend. Echocardiogram pending. Patient refused cardiac catheterization.  10/10:  Patient had 2 syncopal episodes, one last night and 1 this a.m. rapid response was called.  During this morning episode patient all of a sudden became unresponsive and appears very confused.  Initial vitals were stable.  Telemetry noted nonsustained V. tach followed by atrial fibrillation with RVR.  While preparing for amiodarone bolus patient converted back to sinus rhythm with heart rate in 60s.  CT chest and CTA was obtained to rule out CVA which was negative for any acute infarct and CTA of head and neck was negative for any significant large vessel stenosis. Her cardiologist was also consulted and she was placed on amiodarone infusion to prevent further arrhythmias. She also received 500 cc of bolus as blood pressure become softer little after this episode.  Hyperkalemia resolved.  Blood pressure little soft, Patient later agrees with cardiac catheterization which is scheduled for Thursday morning now.  Troponin at 11,321 after peaking at 14,207.  Echocardiogram with normal EF, indeterminate diastolic function, no regional wall motion abnormalities and severe calcification of aortic valve without any evidence of stenosis.  10/11: Patient again very lethargic but following some simple commands when seen during morning rounds.  Blood pressure remained on softer side but improved from yesterday.  Telemetry with some paired PVCs and bigeminy. Going for dialysis today.  Cardiac cath planned for tomorrow morning, n.p.o. after midnight.  We will continue with amiodarone and heparin infusion.  Patient will remain high risk for decompensation and other cardiac arrhythmias based on her underlying comorbidities and recent NSTEMI.  She will remain on heparin and amiodarone infusion.  Palliative care was consulted.  10/12: Patient continues to be lethargic.  Overnight rapid response was  called secondary to change in mental status.  ABG was done which showed elevated PCO2.  Patient was placed on BiPAP which she  has been very noncompliant with.  However this morning when I saw her she was able to identify her relatives at bedside and was a little bit more compliant in wearing the BiPAP.  Cardiology had planned for a heart catheterization today but secondary to her change in mental status this is being canceled.   Assessment and Plan: * NSTEMI (non-ST elevated myocardial infarction) ([place]) Significant increase in troponin, which peaked above 14,000, makes her in the range of NSTEMI.  EKG with ST depression in lateral leads along with SVT. Cardiology was consulted she was started on heparin infusion. BNP markedly elevated above 4500 Cardiac catheterization was planned but is currently on hold due to mental status change -Continue with heparin infusion, dual antiplatelet therapy, statin and Coreg -Plavix was added by cardiology  Metabolic Encephalopathy:  -Etiology possibly secondary to hypoxia/hypercapnia due to underlying pulmonary edema from NSTEMI -CT of the head done on 06/25/2022 negative for any acute findings. -No overt signs of infection identified -Blood gas analysis shows elevated PCO2, continue BiPAP as tolerated -  Repeat ABG later today. -Continue to monitor with neurochecks  Cardiac arrhythmia Patient is experiencing transient, self-limiting cardiac arrhythmias where she became symptomatic.  Had 2 syncopal episode in the past 24 hours.  Most likely secondary to recent NSTEMI. Cardiology started her on amiodarone infusion. -Continue with telemetry monitoring -No more recurrent episodes  Syncope and collapse Patient had 2 syncopal episode in the past 24-hour.  Most likely secondary to cardiac arrhythmia as mentioned above. CT head was negative for any acute infarct.  Chronic lacunar infarcts noted. CTA head and neck was negative for any large vessel occlusion.  Some stenosis noted as mentioned in full report. -Continue to monitor  Pulmonary edema - Presumed secondary to SVT and  NSTEMI - Received her dialysis on admission and going for her routine dialysis today.  Volume overload - Presumed secondary to SVT and NSTEMI - Nephrology has been consulted for dialysis - Continue with oxygen-wean as tolerated  Hyperkalemia Resolved with dialysis. -Continue to monitor -Patient is high risk for cardiac arrhythmia  End stage renal disease ([place]) - Via left upper extremity AV fistula - Monday, Wednesday, Friday - last hemodialysis session was Saturday, she missed her routine dialysis on Friday due to diet of a close friend.  she received full treatment on Saturday - Nephrology consulted, and she received her dialysis yesterday  Essential hypertension - Patient takes Coreg 6.25 mg twice daily, lisinopril 40 mg daily Blood pressure currently soft, required couple of boluses over the past 24 hours. -Keep holding home antihypertensives -Continue to monitor  Anemia of chronic renal failure, stage 5 (HCC) Seems stable and at baseline. -Continue to monitor   Subjective: Patient seems lethargic however was complained on wearing BiPAP with encouragement from the nurse.  Patient was able to answer simple questions with not and was able to identify relatives at bedside.    Physical Exam: Vitals:   06/27/22 0312 06/27/22 0812 06/27/22 1141 06/27/22 1621  BP: (!) 143/82 139/73 117/63 (!) 75/59  Pulse: 69 63 63 69  Resp: $Remo'14 18 20 14  'fIkHG$ Temp: 98.4 F (36.9 C) 97.8 F (36.6 C) 97.7 F (36.5 C) 98.6 F (37 C)  TempSrc: Oral Oral Oral   SpO2: 99% 95% 100% 100%  Weight:      Height:  General.  Lethargic and somnolent lady, in no acute distress. Pulmonary.  Lungs clear bilaterally, normal respiratory effort. CV.  Regular rate and rhythm, no JVD, rub or murmur. Abdomen.  Soft, nontender, nondistended, BS positive. CNS.  Somnolent, following some simple commands Extremities.  No edema, no cyanosis, pulses intact and symmetrical. Psychiatry.  Judgment and insight  appears impaired.  Data Reviewed: Prior data reviewed  Family Communication: Tried calling niece with no response.  Disposition: Status is: Inpatient Remains inpatient appropriate because: Severity of illness   Planned Discharge Destination: Home with Home Health  DVT prophylaxis.  Heparin infusion Time spent: 50 minutes  This record has been created using Systems analyst. Errors have been sought and corrected,but may not always be located. Such creation errors do not reflect on the standard of care.  Author: Carlyle Lipa, MD 06/27/2022 4:35 PM  For on call review [email or web address].

## 2022-06-27 NOTE — Progress Notes (Addendum)
Pt continue to have intermittent out burst, unable to settle down, periods of restlessness and agitation. Rapid Response called to reassess pt, pt labored breathing accessory muscle deep and upper chest crackers noted. CBG checked, and 64 noted 1 ampu of D50 administered. Bld sugar rechecked, 113 noted. D10 added as maintenance fluids. Bi PAP added and inially pulling and refusing. NP updated on status, Pt given bath and begin to settle down,BIPAP replaced and remains in place. Niece Sherre Poot called and given up and appreciative to have t updated information. Will see pt later am. Writer remain with pt and reminded to leave Bipap in place will cont to monitor. SR, RN

## 2022-06-27 NOTE — Care Management Important Message (Signed)
Important Message  Patient Details  Name: Kathleen Sharp MRN: [id number] Date of Birth: [birth year]   Medicare Important Message Given:  Yes     Dannette Barbara 06/27/2022, 2:08 PM

## 2022-06-28 DIAGNOSIS — G9341 Metabolic encephalopathy: Secondary | ICD-10-CM

## 2022-06-28 DIAGNOSIS — I214 Non-ST elevation (NSTEMI) myocardial infarction: Secondary | ICD-10-CM | POA: Diagnosis not present

## 2022-06-28 LAB — BLOOD GAS, VENOUS
Acid-Base Excess: 4.2 mmol/L — ABNORMAL HIGH (ref 0.0–2.0)
Bicarbonate: 31.4 mmol/L — ABNORMAL HIGH (ref 20.0–28.0)
O2 Content: 2 L/min
O2 Saturation: 76 %
Patient temperature: 37
pCO2, Ven: 61 mmHg — ABNORMAL HIGH (ref 44–60)
pH, Ven: 7.32 (ref 7.25–7.43)
pO2, Ven: 46 mmHg — ABNORMAL HIGH (ref 32–45)

## 2022-06-28 LAB — GLUCOSE, CAPILLARY
Glucose-Capillary: 172 mg/dL — ABNORMAL HIGH (ref 70–99)
Glucose-Capillary: 103 mg/dL — ABNORMAL HIGH (ref 70–99)
Glucose-Capillary: 106 mg/dL — ABNORMAL HIGH (ref 70–99)
Glucose-Capillary: 123 mg/dL — ABNORMAL HIGH (ref 70–99)
Glucose-Capillary: 122 mg/dL — ABNORMAL HIGH (ref 70–99)
Glucose-Capillary: 116 mg/dL — ABNORMAL HIGH (ref 70–99)

## 2022-06-28 LAB — COMPREHENSIVE METABOLIC PANEL
ALT: 244 U/L — ABNORMAL HIGH (ref 0–44)
AST: 85 U/L — ABNORMAL HIGH (ref 15–41)
Albumin: 3.2 g/dL — ABNORMAL LOW (ref 3.5–5.0)
Alkaline Phosphatase: 61 U/L (ref 38–126)
Anion gap: 14 (ref 5–15)
BUN: 39 mg/dL — ABNORMAL HIGH (ref 8–23)
CO2: 27 mmol/L (ref 22–32)
Calcium: 8.4 mg/dL — ABNORMAL LOW (ref 8.9–10.3)
Chloride: 90 mmol/L — ABNORMAL LOW (ref 98–111)
Creatinine, Ser: 6.92 mg/dL — ABNORMAL HIGH (ref 0.44–1.00)
GFR, Estimated: 6 mL/min — ABNORMAL LOW (ref >60)
Glucose, Bld: 165 mg/dL — ABNORMAL HIGH (ref 70–99)
Potassium: 3.8 mmol/L (ref 3.5–5.1)
Sodium: 131 mmol/L — ABNORMAL LOW (ref 135–145)
Total Bilirubin: 0.4 mg/dL (ref 0.3–1.2)
Total Protein: 6 g/dL — ABNORMAL LOW (ref 6.5–8.1)

## 2022-06-28 LAB — HEPARIN LEVEL (UNFRACTIONATED)
Heparin Unfractionated: >1.1 IU/mL — ABNORMAL HIGH (ref 0.30–0.70)
Heparin Unfractionated: 0.29 IU/mL — ABNORMAL LOW (ref 0.30–0.70)

## 2022-06-28 MED ORDER — ACETAMINOPHEN 325 MG PO TABS
ORAL_TABLET | ORAL | Status: AC
  Filled 2022-06-28: qty 2

## 2022-06-28 MED ORDER — ASPIRIN 81 MG PO TBEC
81.0000 mg | DELAYED_RELEASE_TABLET | Freq: Every day | ORAL | Status: DC
  Administered 2022-06-28 – 2022-06-30 (×3): 81 mg via ORAL
  Filled 2022-06-28 (×3): qty 1

## 2022-06-28 MED ORDER — HEPARIN BOLUS VIA INFUSION
1000.0000 [IU] | Freq: Once | INTRAVENOUS | Status: AC
  Administered 2022-06-28: 1000 [IU] via INTRAVENOUS
  Filled 2022-06-28: qty 1000

## 2022-06-28 NOTE — Progress Notes (Signed)
SUBJECTIVE:  Patient is a 76 year old female with a history of ESRD on dialysis MWF, hypertension, cardiomegaly, hyperlipidemia, who presented to the ED on 07/05/2022 with shortness of breath, palpitations. Denied chest pain at admission.     On 10/10, patient had an episode of nonsustained ventricular tachycardia followed by atrial fibrillation associated with dizziness. Patient denies losing consciousness. IV amiodoarone started.   Cardiac cath scheduled for 06/27/22 cancelled due to altered mental status.   Vitals:   06/28/22 0252 06/28/22 0308 06/28/22 0411 06/28/22 0732  BP: (!) 73/47 (!) 89/59 (!) 84/56   Pulse: 64 (!) 146 (!) 55   Resp:   16 (!) 21  Temp:  98.2 F (36.8 C) 97.6 F (36.4 C)   TempSrc:   Oral   SpO2: 100% 100% 100%   Weight:      Height:        Intake/Output Summary (Last 24 hours) at 06/28/2022 0845 Last data filed at 06/28/2022 0728 Gross per 24 hour  Intake 1258.84 ml  Output --  Net 1258.84 ml    LABS: Basic Metabolic Panel: Recent Labs    06/27/22 1045 06/28/22 0635  NA 134* 131*  K 4.0 3.8  CL 93* 90*  CO2 25 27  GLUCOSE 100* 165*  BUN 31* 39*  CREATININE 5.86* 6.92*  CALCIUM 8.9 8.4*   Liver Function Tests: Recent Labs    06/27/22 1045 06/28/22 0635  AST 169* 85*  ALT 342* 244*  ALKPHOS 69 61  BILITOT 0.8 0.4  PROT 6.6 6.0*  ALBUMIN 3.5 3.2*   No results for input(s): "LIPASE", "AMYLASE" in the last 72 hours. CBC: Recent Labs    06/27/22 0525 06/27/22 1045  WBC 14.4* 15.4*  HGB 9.5* 10.7*  HCT 30.1* 33.8*  MCV 79.2* 78.8*  PLT 201 230   Cardiac Enzymes: No results for input(s): "CKTOTAL", "CKMB", "CKMBINDEX", "TROPONINI" in the last 72 hours. BNP: Invalid input(s): "POCBNP" D-Dimer: No results for input(s): "DDIMER" in the last 72 hours. Hemoglobin A1C: No results for input(s): "HGBA1C" in the last 72 hours. Fasting Lipid Panel: No results for input(s): "CHOL", "HDL", "LDLCALC", "TRIG", "CHOLHDL", "LDLDIRECT" in  the last 72 hours. Thyroid Function Tests: No results for input(s): "TSH", "T4TOTAL", "T3FREE", "THYROIDAB" in the last 72 hours.  Invalid input(s): "FREET3" Anemia Panel: No results for input(s): "VITAMINB12", "FOLATE", "FERRITIN", "TIBC", "IRON", "RETICCTPCT" in the last 72 hours.   PHYSICAL EXAM General: Well developed, well nourished, in no acute distress HEENT:  Normocephalic and atramatic Neck:  No JVD.  Lungs: Clear bilaterally to auscultation and percussion. Heart: HRRR . Normal S1 and S2 without gallops or murmurs.  Abdomen: Bowel sounds are positive, abdomen soft and non-tender  Msk:  Back normal, normal gait. Normal strength and tone for age. Extremities: No clubbing, cyanosis or edema.   Neuro: More alert today. Psych: responding more appropriately today  TELEMETRY: sinus rhythm  ASSESSMENT AND PLAN: Patient mentation improved today. Answer questions more appropriately. Will reschedule cardiac cath for Monday, 06/24/2022.   Principal Problem:   NSTEMI (non-ST elevated myocardial infarction) [facility]) Active Problems:   Anemia of chronic renal failure, stage 5 (HCC)   End stage renal disease (HCC)   Essential hypertension   Volume overload   Hyperkalemia   Pulmonary edema   Cardiac arrhythmia   Syncope and collapse    Jhace Fennell, FNP-C 06/28/2022 8:45 AM

## 2022-06-28 NOTE — Progress Notes (Signed)
Palliative: Attempt to see Kathleen Sharp but she was off the floor in HD.  Chart review completed.  At this point, Kathleen Sharp is to have cardiac cath on Monday, October 16.  She is well-known to the nephrology group and they are following her closely. Palliative medicine team to follow-up after cardiac cath for further goals of care discussions.  No charge Quinn Axe, NP Palliative medicine team Team phone [phone] Greater than 50% of this time was spent counseling and coordinating care related to the above assessment and plan.

## 2022-06-28 NOTE — Progress Notes (Signed)
[facility]  ROUNDING NOTE   Subjective:   Kathleen Sharp is a 76 year old female with past medical conditions including hyperlipidemia, hypertension, cardiomegaly, and end-stage renal disease on hemodialysis.  Patient presents to the emergency department with complaints of shortness of breath and has been admitted for Hyperkalemia [E87.5] SOB (shortness of breath) [R06.02] Tachyarrhythmia [R00.0] Volume overload [E87.70]  Patient is known to our practice and receives outpatient dialysis treatments at [facility] on a MWF schedule, supervised by Dr. Candiss Norse.   Patient seen and evaluated during dialysis   HEMODIALYSIS FLOWSHEET:  Blood Flow Rate (mL/min): 300 mL/min Arterial Pressure (mmHg): -60 mmHg Venous Pressure (mmHg): 310 mmHg TMP (mmHg): 0 mmHg Ultrafiltration Rate (mL/min): 844 mL/min Dialysate Flow Rate (mL/min): 300 ml/min Dialysis Fluid Bolus: Normal Saline  Restless, repeated attempts to sit up  Objective:  Vital signs in last 24 hours:  Temp:  [97.6 F (36.4 C)-98.7 F (37.1 C)] 97.6 F (36.4 C) (10/13 0411) Pulse Rate:  [55-146] 69 (10/13 1000) Resp:  [14-23] 21 (10/13 1000) BP: (73-160)/(47-119) 160/119 (10/13 1000) SpO2:  [93 %-100 %] 100 % (10/13 1000) FiO2 (%):  [50 %] 50 % (10/12 1621)  Weight change:  Filed Weights   06/24/22 1343 06/26/22 0604 06/26/22 1342  Weight: 82.1 kg 81.6 kg 83.6 kg    Intake/Output: I/O last 3 completed shifts: In: 1539.6 [I.V.:1539.6] Out: -    Intake/Output this shift:  Total I/O In: 620.3 [I.V.:620.3] Out: -   Physical Exam: General: NAD  Head: Normocephalic, atraumatic. Moist oral mucosal membranes  Eyes: Anicteric  Lungs:  Diminished in bases, normal effort, room air  Heart: Regular rate and rhythm  Abdomen:  Soft, nontender, obese  Extremities: Trace peripheral edema.  Neurologic: Nonfocal, moving all four extremities  Skin: No lesions  Access: Left AVG    Basic Metabolic  Panel: Recent Labs  Lab 06/24/22 0953 06/24/22 1330 06/24/22 2127 06/25/22 0442 06/26/22 0621 06/27/22 1045 06/28/22 0635  NA 137  --  137 135 134* 134* 131*  K 6.2*   < > 4.9 4.8 5.0 4.0 3.8  CL 99  --  90* 93* 92* 93* 90*  CO2 21*  --  $R'24 28 25 25 27  'zo$ GLUCOSE 72  --  137* 97 84 100* 165*  BUN 76*  --  39* 45* 64* 31* 39*  CREATININE 10.67*  --  6.68* 7.34* 8.41* 5.86* 6.92*  CALCIUM 8.8*  --  8.6* 8.4* 8.6* 8.9 8.4*  MG  --   --  1.8  --   --   --   --   PHOS 7.5*  --   --   --   --   --   --    < > = values in this interval not displayed.     Liver Function Tests: Recent Labs  Lab 06/24/22 0537 06/24/22 0953 06/24/22 2127 06/27/22 1045 06/28/22 0635  AST 144*  --  144* 169* 85*  ALT 77*  --  118* 342* 244*  ALKPHOS 59  --  56 69 61  BILITOT 0.8  --  0.9 0.8 0.4  PROT 6.9  --  6.7 6.6 6.0*  ALBUMIN 3.6 3.6 3.4* 3.5 3.2*    No results for input(s): "LIPASE", "AMYLASE" in the last 168 hours. No results for input(s): "AMMONIA" in the last 168 hours.  CBC: Recent Labs  Lab 07/02/2022 1615 07/01/2022 2350 06/24/22 2127 06/25/22 0442 06/26/22 0621 06/27/22 0525 06/27/22 1045  WBC 5.9   < >  12.3* 14.8* 17.3* 14.4* 15.4*  NEUTROABS 5.2  --   --   --   --   --   --   HGB 11.8*   < > 10.9* 10.6* 10.6* 9.5* 10.7*  HCT 38.5   < > 34.9* 33.6* 33.2* 30.1* 33.8*  MCV 82.4   < > 80.0 79.8* 79.6* 79.2* 78.8*  PLT 269   < > 242 230 222 201 230   < > = values in this interval not displayed.     Cardiac Enzymes: No results for input(s): "CKTOTAL", "CKMB", "CKMBINDEX", "TROPONINI" in the last 168 hours.  BNP: Invalid input(s): "POCBNP"  CBG: Recent Labs  Lab 06/27/22 1632 06/27/22 2017 06/27/22 2325 06/28/22 0634 06/28/22 0818  GLUCAP 84 89 103* 172* 103*     Microbiology: Results for orders placed or performed during the hospital encounter of 07/03/2022  Resp Panel by RT-PCR (Flu A&B, Covid) Anterior Nasal Swab     Status: None   Collection Time: 06/25/2022   5:13 PM   Specimen: Anterior Nasal Swab  Result Value Ref Range Status   SARS Coronavirus 2 by RT PCR NEGATIVE NEGATIVE Final    Comment: (NOTE) SARS-CoV-2 target nucleic acids are NOT DETECTED.  The SARS-CoV-2 RNA is generally detectable in upper respiratory specimens during the acute phase of infection. The lowest concentration of SARS-CoV-2 viral copies this assay can detect is 138 copies/mL. A negative result does not preclude SARS-Cov-2 infection and should not be used as the sole basis for treatment or other patient management decisions. A negative result may occur with  improper specimen collection/handling, submission of specimen other than nasopharyngeal swab, presence of viral mutation(s) within the areas targeted by this assay, and inadequate number of viral copies(<138 copies/mL). A negative result must be combined with clinical observations, patient history, and epidemiological information. The expected result is Negative.  Fact Sheet for Patients:  EntrepreneurPulse.com.au  Fact Sheet for Healthcare Providers:  IncredibleEmployment.be  This test is no t yet approved or cleared by the [place] FDA and  has been authorized for detection and/or diagnosis of SARS-CoV-2 by FDA under an Emergency Use Authorization (EUA). This EUA will remain  in effect (meaning this test can be used) for the duration of the COVID-19 declaration under Section 564(b)(1) of the Act, 21 U.S.C.section 360bbb-3(b)(1), unless the authorization is terminated  or revoked sooner.       Influenza A by PCR NEGATIVE NEGATIVE Final   Influenza B by PCR NEGATIVE NEGATIVE Final    Comment: (NOTE) The Xpert Xpress SARS-CoV-2/FLU/RSV plus assay is intended as an aid in the diagnosis of influenza from Nasopharyngeal swab specimens and should not be used as a sole basis for treatment. Nasal washings and aspirates are unacceptable for Xpert Xpress  SARS-CoV-2/FLU/RSV testing.  Fact Sheet for Patients: EntrepreneurPulse.com.au  Fact Sheet for Healthcare Providers: IncredibleEmployment.be  This test is not yet approved or cleared by the [place] FDA and has been authorized for detection and/or diagnosis of SARS-CoV-2 by FDA under an Emergency Use Authorization (EUA). This EUA will remain in effect (meaning this test can be used) for the duration of the COVID-19 declaration under Section 564(b)(1) of the Act, 21 U.S.C. section 360bbb-3(b)(1), unless the authorization is terminated or revoked.  Performed at [facility], [address] [place], [place] [address]   MRSA Next Gen by PCR, Nasal     Status: None   Collection Time: 06/24/22  4:49 PM   Specimen: Nasal Mucosa; Nasal Swab  Result Value Ref Range Status   MRSA by PCR Next Gen NOT DETECTED NOT DETECTED Final    Comment: (NOTE) The GeneXpert MRSA Assay (FDA approved for NASAL specimens only), is one component of a comprehensive MRSA colonization surveillance program. It is not intended to diagnose MRSA infection nor to guide or monitor treatment for MRSA infections. Test performance is not FDA approved in patients less than 31 years old. Performed at [facility], [address] [place], [place] [address]     Coagulation Studies: No results for input(s): "LABPROT", "INR" in the last 72 hours.   Urinalysis: No results for input(s): "COLORURINE", "LABSPEC", "PHURINE", "GLUCOSEU", "HGBUR", "BILIRUBINUR", "KETONESUR", "PROTEINUR", "UROBILINOGEN", "NITRITE", "LEUKOCYTESUR" in the last 72 hours.  Invalid input(s): "APPERANCEUR"    Imaging: [place] EKG SITE RITE  Result Date: 06/27/2022 If Site Rite image not attached, placement could not be confirmed due to current cardiac rhythm.    Medications:    sodium chloride     sodium chloride Stopped (06/27/22 0300)   amiodarone 30 mg/hr (06/28/22 0728)    dextrose 30 mL/hr at 06/28/22 0728   diltiazem (CARDIZEM) infusion Stopped (06/24/22 1104)   heparin 1,150 Units/hr (06/28/22 0728)    acetaminophen       atorvastatin  20 mg Oral q1800   carvedilol  6.25 mg Oral BID WC   Chlorhexidine Gluconate Cloth  6 each Topical Q0600   clopidogrel  75 mg Oral Daily   LORazepam  0.5 mg Intravenous Once   sevelamer carbonate  1,600 mg Oral TID WC   sodium chloride flush  10-40 mL Intracatheter Q12H   sodium chloride flush  3 mL Intravenous Q12H   sodium chloride, acetaminophen, acetaminophen **OR** acetaminophen, hydrALAZINE, ondansetron **OR** ondansetron (ZOFRAN) IV, senna-docusate, sodium chloride flush, sodium chloride flush  Assessment/ Plan:  Ms. Kathleen Sharp is a 76 y.o.  female with past medical conditions including hyperlipidemia, hypertension, cardiomegaly, and end-stage renal disease on hemodialysis.  Patient presents to the emergency department with complaints of shortness of breath and has been admitted for Hyperkalemia [E87.5] SOB (shortness of breath) [R06.02] Tachyarrhythmia [R00.0] Volume overload [E87.70]  [facility] [place]/M/F/left AVG/82 kg  Volume overload/hyperkalemia with end-stage renal disease on hemodialysis.  Potassium on ED arrival 5.9, increased to 6.1 after pharmacologic treatment.  Chest x-ray shows moderate pulmonary edema with bilateral pleural effusions.    Receiving dialysis today, UF goal 1.5 to 2 L as tolerated.  The patient restless and attempting to sit up/roll over causing dialysis machine alarms.  Potassium corrected, 3.8.  Remains on room air.  Patient requesting termination of treatment.  Discussed this with patient and patient willing to return to complete treatment tomorrow.  2. Anemia of chronic kidney disease Lab Results  Component Value Date   HGB 10.7 (L) 06/27/2022    Hemoglobin at goal  3. Secondary Hyperparathyroidism: with outpatient labs: PTH 798, phosphorus 8.5, calcium 8.0 on  04/29/22.   Lab Results  Component Value Date   CALCIUM 8.4 (L) 06/28/2022   PHOS 7.5 (H) 06/24/2022     Continue sevelamer ordered with meals.  4.  Hypertension with chronic kidney disease.  Home regimen includes carvedilol and lisinopril.  Remains on receiving carvedilol and amiodarone drip.  Blood pressure 150/93 during dialysis  5.  NSTEMI with elevated troponin and ST depression with SVT on EKG. cardiac cath canceled yesterday due to altered mental status.  We will reschedule once patient stable.   LOS: 4   10/13/202310:35 AM

## 2022-06-28 NOTE — Progress Notes (Addendum)
[place] for heparin infusion Indication: ACS/STEMI  No Known Allergies  Patient Measurements: Height: 5\' 2"  (157.5 cm) Weight: 83.6 kg (184 lb 4.9 oz) IBW/kg (Calculated) : 50.1 Heparin Dosing Weight: 68.5 kg  Vital Signs: Temp: 97.6 F (36.4 C) (10/13 0411) Temp Source: Oral (10/13 0411) BP: 84/56 (10/13 0411) Pulse Rate: 55 (10/13 0411)  Labs: Recent Labs    06/26/22 0621 06/27/22 0525 06/27/22 1045 06/28/22 0635  HGB 10.6* 9.5* 10.7*  --   HCT 33.2* 30.1* 33.8*  --   PLT 222 201 230  --   HEPARINUNFRC 0.64 0.35 0.39 >1.10*  CREATININE 8.41*  --  5.86* 6.92*     Estimated Creatinine Clearance: 7.1 mL/min (A) (by C-G formula based on SCr of 6.92 mg/dL (H)).   Medical History: Past Medical History:  Diagnosis Date   Anemia    Arthritis    Chronic kidney disease    Chronic Kidney Disease   Hypercholesterolemia    Hypertension   Heparin Dosing Weight: 68.5 kg  Assessment: Pt is a 76 yo female presenting to ED w/ SOB found with elevated troponin I level, trending up  Goal of Therapy:  Heparin level 0.3-0.7 units/ml Monitor platelets by anticoagulation protocol: Yes  Date Time HL Rate/Comment 10/9 0953 0.35 Therapeutic x1 10/9 1753 0.24 Subtherapeutic; 850>1000 un/hr 10/10  0442 0.28 Subtherapeutic; 1000 > 1150 un/hr 10/10 1439 0.51 Therapeutic x1 10/10 2219 0.49 Thera x2; 1150 un/hr 10/11 0621 0.64 Thera x2; 1150 un/hr 10/12   0525   0.35     Therapeutic X 4 10/13 0731 >1.10   Plan:  10/13 HL >1.10. Repeat HL STAT Patient has been therapeutic x4 so I suspect this may be heparin contamination with the blood draw being from the improper site. Continue heparin infusion at 1,150 units/hr Continue to monitor H&H and platelets daily while on heparin gtt.   [place] Pharmacist 06/28/2022 7:41 AM

## 2022-06-28 NOTE — Progress Notes (Signed)
Interventional Radiology Brief Note:  IR consulted for PICC placement in patient with ESRD on HD via LUE AVG.  Patient in need of 3 separate lines for multiple infusions. IR considered placement of a central line in light of her renal history, however a triple lumen PICC was successfully placed by IV team yesterday.  Will cancel order for IR PICC placement at this time.   Brynda Greathouse, MS RD PA-C

## 2022-06-28 NOTE — Progress Notes (Signed)
Discontinued patient treatment early, patient is agitated and restless causing high venous pressures and constant alarming. Patient refused re-cannulation, and asked to be taken off several times. Discussed with NP and agreed to discontinue for today and will reattempt tomorrow.

## 2022-06-28 NOTE — Assessment & Plan Note (Signed)
Patient is exhibiting signs of delirium with intermittent change in mental status.  Underlying cardiac arrhythmias suspected.  She is becoming more confused after dialysis. Patient was placed on BiPAP overnight. Currently improved mental status at this time. -Continue to monitor

## 2022-06-28 NOTE — Progress Notes (Signed)
Progress Note   Patient: Kathleen Sharp IWO:[id number] DOB: [birth year] DOA: 06/22/2022     4 DOS: the patient was seen and examined on 06/28/2022   Brief hospital course: Ms. Ora Bollig is a 76 year old female with history of end-stage renal disease on hemodialysis Monday Wednesday 12-04-22, hypertension, cardiomegaly, hyperlipidemia, who presents emergency department for chief concerns of shortness of breath.  Patient was complaining of shortness of breath with associated flushed face and palpitations.  No chest pain, nausea or vomiting.  Initial vitals in the emergency department showed temperature of 97.5, respiration rate of 29, heart rate of 95, blood pressure 108/87, SPO2 of 96% on 4 L nasal cannula.  Serum sodium is 135, potassium 5.9, chloride of 97, bicarb 20, BUN of 63, serum creatinine of 9.33, GFR 4, nonfasting blood glucose 172, WBC 5.9, hemoglobin 11.8, platelets of 269.  Lactic acid is 4.1.  High sensitive troponin is 89.  COVID/influenza A/influenza B PCR negative.  ED treatment: Diltiazem 10 mg IV one-time dose, Lokelma p.o., insulin aspart 10 mg, D50, calcium gluconate, vancomycin and cefepime.  10/9: Patient overnight had another episode of dyspnea with flushed face and palpitations.  Found to have supraventricular tachycardia requiring IV diltiazem push.  Troponin increased to above 10,000, peaked at 14,207>>13,306.  Potassium at 6.1, BUN 17, creatinine 10.41, it yesterday 144, ALT 77 and anion gap of 16.  BNP >4500, D-dimer 2.66>>3.10. EKG with SVT, peaked T in anteroseptal and ST depression in lateral leads. Patient was started on heparin infusion by night on-call, cardiology and nephrology was consulted.  Going for dialysis this morning. Per patient she never missed any dialysis, last dialysis was on Saturday as she was unable to go for her 12/04/22 session due to the death of a dear friend. Echocardiogram pending. Patient refused cardiac catheterization.  10/10:  Patient had 2 syncopal episodes, one last night and 1 this a.m. rapid response was called.  During this morning episode patient all of a sudden became unresponsive and appears very confused.  Initial vitals were stable.  Telemetry noted nonsustained V. tach followed by atrial fibrillation with RVR.  While preparing for amiodarone bolus patient converted back to sinus rhythm with heart rate in 60s.  CT chest and CTA was obtained to rule out CVA which was negative for any acute infarct and CTA of head and neck was negative for any significant large vessel stenosis. Her cardiologist was also consulted and she was placed on amiodarone infusion to prevent further arrhythmias. She also received 500 cc of bolus as blood pressure become softer little after this episode.  Hyperkalemia resolved.  Blood pressure little soft, Patient later agrees with cardiac catheterization which is scheduled for Thursday morning now.  Troponin at 11,321 after peaking at 14,207.  Echocardiogram with normal EF, indeterminate diastolic function, no regional wall motion abnormalities and severe calcification of aortic valve without any evidence of stenosis.  10/11: Patient again very lethargic but following some simple commands when seen during morning rounds.  Blood pressure remained on softer side but improved from yesterday.  Telemetry with some paired PVCs and bigeminy. Going for dialysis today.  Cardiac cath planned for tomorrow morning, n.p.o. after midnight.  We will continue with amiodarone and heparin infusion.  Patient will remain high risk for decompensation and other cardiac arrhythmias based on her underlying comorbidities and recent NSTEMI.    Palliative care was consulted.  10/12: Patient continues to be lethargic.  Overnight rapid response was called secondary to change in mental status.  ABG was done which showed elevated PCO2.  Patient was placed on BiPAP which she has been very noncompliant with.  However this  morning when I saw her she was able to identify her relatives at bedside and was a little bit more compliant in wearing the BiPAP.  Cardiology had planned for a heart catheterization today but secondary to her change in mental status this is being canceled.  10/13: No overnight concerns.  Patient had a shorter dialysis session today due to agitation and requesting to stop it so it was terminated prematurely.  Complaining of lower substernal pain. Discussed with cardiology and her heparin infusion will be discontinued as she is on heparin infusion for more than 72 hours now.  She was started on aspirin along with Plavix. Cardiac cath is not scheduled for Monday at 7:30 AM. She will continue on amiodarone infusion per cardiology as she continued to have transient tachyarrhythmias.   Assessment and Plan: * NSTEMI (non-ST elevated myocardial infarction) ([place]) Significant increase in troponin, which peaked above 14,000, makes her in the range of NSTEMI.  EKG with ST depression in lateral leads along with SVT. Cardiology was consulted she was started on heparin infusion-which she continued more than 72 hours so it will be discontinued today. BNP markedly elevated above 4500 Cardiac catheterization on Thursday got canceled due to altered mental status and is now rescheduled for Monday at 7:30 AM. -Start her on aspirin along with Plavix. -Continue to monitor  Cardiac arrhythmia Patient is experiencing transient, self-limiting cardiac arrhythmias where she became symptomatic.  Had 2 syncopal episode during current hospitalization.  Most likely secondary to recent NSTEMI. Cardiology started her on amiodarone infusion. -Continue with telemetry monitoring  Syncope and collapse Patient had 2 syncopal episode in the past 24-hour.  Most likely secondary to cardiac arrhythmia as mentioned above. CT head was negative for any acute infarct.  Chronic lacunar infarcts noted. CTA head and neck was negative for  any large vessel occlusion.  Some stenosis noted as mentioned in full report. -Continue to monitor  Pulmonary edema - Presumed secondary to SVT and NSTEMI - Received her dialysis on admission and going for her routine dialysis today.  Metabolic encephalopathy Due to increased somnolence and altered mental status, ABG was obtained which shows hypercarbia.  Most likely multiple reasons which include hypercarbia, underlying pulmonary edema with recent NSTEMI. Patient was placed on BiPAP overnight. Currently improved mental status at this time. -Continue to monitor  Volume overload - Presumed secondary to SVT and NSTEMI - Nephrology has been consulted for dialysis - Continue with oxygen-wean as tolerated  Hyperkalemia Resolved with dialysis. -Continue to monitor -Patient is high risk for cardiac arrhythmia  End stage renal disease ([place]) - Via left upper extremity AV fistula - Monday, Wednesday, Friday -Unable to complete her routine dialysis today due to agitation, resulted in early termination. -Nephrology is on board -Continue with routine dialysis  Essential hypertension - Patient takes Coreg 6.25 mg twice daily, lisinopril 40 mg daily Blood pressure seems improving with intermittently mildly elevated readings.  Still had couple of softer blood pressure readings. -Keep holding home antihypertensives -Continue to monitor  Anemia of chronic renal failure, stage 5 (HCC) Seems stable and at baseline. -Continue to monitor   Subjective: Patient was sitting in chair comfortably when seen today.  She was feeling hungry and asking about food.  Alert and oriented x3.  Complaining of mild lower substernal chest pain.  Nonradiating.  No shortness of breath.  Physical Exam: Vitals:  06/28/22 1030 06/28/22 1055 06/28/22 1105 06/28/22 1204  BP: (!) 168/80 (!) 150/93 (!) 168/80 (!) 140/78  Pulse: 69 76 80 88  Resp: 20 (!) $Remo'23 20 18  'xkGZo$ Temp:  98 F (36.7 C)  98 F (36.7 C)  TempSrc:   Oral  Oral  SpO2: 100% 100% 100% 100%  Weight:      Height:       General.  Chronically ill-appearing elderly lady, in no acute distress. Pulmonary.  Lungs clear bilaterally, normal respiratory effort. CV.  Regular rate and rhythm, no JVD, rub or murmur. Abdomen.  Soft, nontender, nondistended, BS positive. CNS.  Alert and oriented .  No focal neurologic deficit. Extremities.  No edema, no cyanosis, pulses intact and symmetrical. Psychiatry.  Judgment and insight appears normal.   Data Reviewed: Prior data reviewed  Family Communication: Unable to reach niece on phone  Disposition: Status is: Inpatient Remains inpatient appropriate because: Severity of illness   Planned Discharge Destination: Home with Home Health  DVT prophylaxis.  Heparin infusion Time spent: 40 minutes  This record has been created using Systems analyst. Errors have been sought and corrected,but may not always be located. Such creation errors do not reflect on the standard of care.  Author: Lorella Nimrod, MD 06/28/2022 2:15 PM  For on call review [email or web address].

## 2022-06-28 NOTE — Progress Notes (Addendum)
[place] for heparin infusion Indication: ACS/STEMI  No Known Allergies  Patient Measurements: Height: 5\' 2"  (157.5 cm) Weight: 83.6 kg (184 lb 4.9 oz) IBW/kg (Calculated) : 50.1 Heparin Dosing Weight: 68.5 kg  Vital Signs: Temp: 97.6 F (36.4 C) (10/13 0411) Temp Source: Oral (10/13 0830) BP: 160/119 (10/13 1000) Pulse Rate: 69 (10/13 1000)  Labs: Recent Labs    06/26/22 0621 06/27/22 0525 06/27/22 1045 06/28/22 0635 06/28/22 0930  HGB 10.6* 9.5* 10.7*  --   --   HCT 33.2* 30.1* 33.8*  --   --   PLT 222 201 230  --   --   HEPARINUNFRC 0.64 0.35 0.39 >1.10* 0.29*  CREATININE 8.41*  --  5.86* 6.92*  --      Estimated Creatinine Clearance: 7.1 mL/min (A) (by C-G formula based on SCr of 6.92 mg/dL (H)).   Medical History: Past Medical History:  Diagnosis Date   Anemia    Arthritis    Chronic kidney disease    Chronic Kidney Disease   Hypercholesterolemia    Hypertension   Heparin Dosing Weight: 68.5 kg  Assessment: Pt is a 76 yo female presenting to ED w/ SOB found with elevated troponin I level, trending up  Goal of Therapy:  Heparin level 0.3-0.7 units/ml Monitor platelets by anticoagulation protocol: Yes  Date Time HL Rate/Comment 10/9 0953 0.35 Therapeutic x1 10/9 1753 0.24 Subtherapeutic; 850>1000 un/hr 10/10  0442 0.28 Subtherapeutic; 1000 > 1150 un/hr 10/10 1439 0.51 Therapeutic x1 10/10 2219 0.49 Thera x2; 1150 un/hr 10/11 0621 0.64 Thera x2; 1150 un/hr 10/12   0525   0.35     Therapeutic X 4 10/13 0731 >1.10 Drawn from wrong site 10/14 1046 0.29 Subtherapeutic 1150 > 1200 un/hr  Plan:  10/13 HL borderline subtherapeutic.  Give heparin 1000 units IV bolus x1 then increase heparin infusion to 1200 units/hr Check heparin level in 8 hours Continue to monitor H&H and platelets daily while on heparin gtt.   [place] Pharmacist 06/28/2022 10:51 AM

## 2022-06-29 DIAGNOSIS — I214 Non-ST elevation (NSTEMI) myocardial infarction: Secondary | ICD-10-CM | POA: Diagnosis not present

## 2022-06-29 LAB — BASIC METABOLIC PANEL
Anion gap: 12 (ref 5–15)
BUN: 49 mg/dL — ABNORMAL HIGH (ref 8–23)
CO2: 26 mmol/L (ref 22–32)
Calcium: 8.6 mg/dL — ABNORMAL LOW (ref 8.9–10.3)
Chloride: 92 mmol/L — ABNORMAL LOW (ref 98–111)
Creatinine, Ser: 7.35 mg/dL — ABNORMAL HIGH (ref 0.44–1.00)
GFR, Estimated: 5 mL/min — ABNORMAL LOW (ref >60)
Glucose, Bld: 136 mg/dL — ABNORMAL HIGH (ref 70–99)
Potassium: 3.9 mmol/L (ref 3.5–5.1)
Sodium: 130 mmol/L — ABNORMAL LOW (ref 135–145)

## 2022-06-29 LAB — GLUCOSE, CAPILLARY
Glucose-Capillary: 137 mg/dL — ABNORMAL HIGH (ref 70–99)
Glucose-Capillary: 122 mg/dL — ABNORMAL HIGH (ref 70–99)
Glucose-Capillary: 128 mg/dL — ABNORMAL HIGH (ref 70–99)
Glucose-Capillary: 103 mg/dL — ABNORMAL HIGH (ref 70–99)

## 2022-06-29 LAB — BLOOD GAS, VENOUS
Acid-Base Excess: 3.2 mmol/L — ABNORMAL HIGH (ref 0.0–2.0)
Bicarbonate: 30.6 mmol/L — ABNORMAL HIGH (ref 20.0–28.0)
O2 Content: 4 L/min
O2 Saturation: 72.8 %
Patient temperature: 37
pCO2, Ven: 58 mmHg (ref 44–60)
pH, Ven: 7.33 (ref 7.25–7.43)
pO2, Ven: 44 mmHg (ref 32–45)

## 2022-06-29 LAB — CBC
HCT: 29.4 % — ABNORMAL LOW (ref 36.0–46.0)
Hemoglobin: 9.5 g/dL — ABNORMAL LOW (ref 12.0–15.0)
MCH: 25.4 pg — ABNORMAL LOW (ref 26.0–34.0)
MCHC: 32.3 g/dL (ref 30.0–36.0)
MCV: 78.6 fL — ABNORMAL LOW (ref 80.0–100.0)
Platelets: 202 10*3/uL (ref 150–400)
RBC: 3.74 MIL/uL — ABNORMAL LOW (ref 3.87–5.11)
RDW: 15.5 % (ref 11.5–15.5)
WBC: 13.6 10*3/uL — ABNORMAL HIGH (ref 4.0–10.5)
nRBC: 1.5 % — ABNORMAL HIGH (ref 0.0–0.2)

## 2022-06-29 LAB — TROPONIN I (HIGH SENSITIVITY)
Troponin I (High Sensitivity): 4088 ng/L (ref <18)
Troponin I (High Sensitivity): 2872 ng/L (ref <18)

## 2022-06-29 MED ORDER — ALBUMIN HUMAN 25 % IV SOLN
25.0000 g | Freq: Once | INTRAVENOUS | Status: AC
  Administered 2022-06-29: 25 g via INTRAVENOUS
  Filled 2022-06-29: qty 100

## 2022-06-29 MED ORDER — MELATONIN 5 MG PO TABS
5.0000 mg | ORAL_TABLET | Freq: Once | ORAL | Status: AC
  Administered 2022-06-29: 5 mg via ORAL
  Filled 2022-06-29: qty 1

## 2022-06-29 MED ORDER — ALBUTEROL SULFATE (2.5 MG/3ML) 0.083% IN NEBU
2.5000 mg | INHALATION_SOLUTION | Freq: Four times a day (QID) | RESPIRATORY_TRACT | Status: DC | PRN
  Administered 2022-06-30 – 2022-07-03 (×2): 2.5 mg via RESPIRATORY_TRACT
  Filled 2022-06-29 (×2): qty 3

## 2022-06-29 NOTE — Progress Notes (Signed)
Pt sbp >100, UF on, fluid removal resumed, new goal of 1L. Theador Hawthorne, MD notified. CCHT at bedside. Safety maintained. Continue to monitor.

## 2022-06-29 NOTE — Progress Notes (Signed)
Post hd rn assessment 

## 2022-06-29 NOTE — Progress Notes (Signed)
Pt out of UF, sbp<90, no c/o, resting. RN and CCHT at bedside. Safety maintained. Will continue to monitor.

## 2022-06-29 NOTE — Progress Notes (Signed)
[facility]  ROUNDING NOTE   Subjective:   Kathleen Sharp is a 76 year old female with past medical conditions including hyperlipidemia, hypertension, cardiomegaly, and end-stage renal disease on hemodialysis.  Patient presents to the emergency department with complaints of shortness of breath and has been admitted for Hyperkalemia [E87.5] SOB (shortness of breath) [R06.02] Tachyarrhythmia [R00.0] Volume overload [E87.70]  Patient is known to our practice and receives outpatient dialysis treatments at [facility] on a MWF schedule, supervised by Dr. Candiss Norse.   Patient seen and evaluated during dialysis   HEMODIALYSIS FLOWSHEET:  Blood Flow Rate (mL/min): 350 mL/min Arterial Pressure (mmHg): -190 mmHg Venous Pressure (mmHg): 250 mmHg TMP (mmHg): 3 mmHg Ultrafiltration Rate (mL/min): 0 mL/min Dialysate Flow Rate (mL/min): 300 ml/min Dialysis Fluid Bolus: Normal Saline    Objective:  Vital signs in last 24 hours:  Temp:  [97.7 F (36.5 C)-98.2 F (36.8 C)] 97.8 F (36.6 C) (10/14 1140) Pulse Rate:  [64-74] 74 (10/14 1147) Resp:  [16-23] 18 (10/14 1147) BP: (84-155)/(35-87) 90/64 (10/14 1147) SpO2:  [93 %-100 %] 94 % (10/14 1147) Weight:  [85.5 kg] 85.5 kg (10/14 0415)  Weight change:  Filed Weights   06/26/22 0604 06/26/22 1342 06/29/22 0415  Weight: 81.6 kg 83.6 kg 85.5 kg    Intake/Output: I/O last 3 completed shifts: In: 1457.6 [I.V.:1457.6] Out: 0.1 [Other:0.1]   Intake/Output this shift:  Total I/O In: -  Out: 100 [Other:100]  Physical Exam: General: NAD  Head: Normocephalic, atraumatic. Moist oral mucosal membranes  Eyes: Anicteric  Lungs:  Diminished in bases, normal effort, room air  Heart: Regular rate and rhythm  Abdomen:  Soft, nontender, obese  Extremities: Trace peripheral edema.  Neurologic: Nonfocal, moving all four extremities  Skin: No lesions  Access: Left AVG    Basic Metabolic Panel: Recent Labs  Lab  06/24/22 0953 06/24/22 1330 06/24/22 2127 06/25/22 0442 06/26/22 0621 06/27/22 1045 06/28/22 0635 06/29/22 0428  NA 137  --  137 135 134* 134* 131* 130*  K 6.2*   < > 4.9 4.8 5.0 4.0 3.8 3.9  CL 99  --  90* 93* 92* 93* 90* 92*  CO2 21*  --  $R'24 28 25 25 27 26  'dt$ GLUCOSE 72  --  137* 97 84 100* 165* 136*  BUN 76*  --  39* 45* 64* 31* 39* 49*  CREATININE 10.67*  --  6.68* 7.34* 8.41* 5.86* 6.92* 7.35*  CALCIUM 8.8*  --  8.6* 8.4* 8.6* 8.9 8.4* 8.6*  MG  --   --  1.8  --   --   --   --   --   PHOS 7.5*  --   --   --   --   --   --   --    < > = values in this interval not displayed.    Liver Function Tests: Recent Labs  Lab 06/24/22 0537 06/24/22 0953 06/24/22 2127 06/27/22 1045 06/28/22 0635  AST 144*  --  144* 169* 85*  ALT 77*  --  118* 342* 244*  ALKPHOS 59  --  56 69 61  BILITOT 0.8  --  0.9 0.8 0.4  PROT 6.9  --  6.7 6.6 6.0*  ALBUMIN 3.6 3.6 3.4* 3.5 3.2*   No results for input(s): "LIPASE", "AMYLASE" in the last 168 hours. No results for input(s): "AMMONIA" in the last 168 hours.  CBC: Recent Labs  Lab 07/16/2022 1615 07/06/2022 2350 06/25/22 9628 06/26/22 3662 06/27/22 9476  06/27/22 1045 06/29/22 0428  WBC 5.9   < > 14.8* 17.3* 14.4* 15.4* 13.6*  NEUTROABS 5.2  --   --   --   --   --   --   HGB 11.8*   < > 10.6* 10.6* 9.5* 10.7* 9.5*  HCT 38.5   < > 33.6* 33.2* 30.1* 33.8* 29.4*  MCV 82.4   < > 79.8* 79.6* 79.2* 78.8* 78.6*  PLT 269   < > 230 222 201 230 202   < > = values in this interval not displayed.    Cardiac Enzymes: No results for input(s): "CKTOTAL", "CKMB", "CKMBINDEX", "TROPONINI" in the last 168 hours.  BNP: Invalid input(s): "POCBNP"  CBG: Recent Labs  Lab 06/28/22 1634 06/28/22 1952 06/28/22 2318 06/29/22 0351 06/29/22 0811  GLUCAP 123* 122* 116* 62* 27*    Microbiology: Results for orders placed or performed during the hospital encounter of 07/11/2022  Resp Panel by RT-PCR (Flu A&B, Covid) Anterior Nasal Swab     Status: None    Collection Time: 07/03/2022  5:13 PM   Specimen: Anterior Nasal Swab  Result Value Ref Range Status   SARS Coronavirus 2 by RT PCR NEGATIVE NEGATIVE Final    Comment: (NOTE) SARS-CoV-2 target nucleic acids are NOT DETECTED.  The SARS-CoV-2 RNA is generally detectable in upper respiratory specimens during the acute phase of infection. The lowest concentration of SARS-CoV-2 viral copies this assay can detect is 138 copies/mL. A negative result does not preclude SARS-Cov-2 infection and should not be used as the sole basis for treatment or other patient management decisions. A negative result may occur with  improper specimen collection/handling, submission of specimen other than nasopharyngeal swab, presence of viral mutation(s) within the areas targeted by this assay, and inadequate number of viral copies(<138 copies/mL). A negative result must be combined with clinical observations, patient history, and epidemiological information. The expected result is Negative.  Fact Sheet for Patients:  EntrepreneurPulse.com.au  Fact Sheet for Healthcare Providers:  IncredibleEmployment.be  This test is no t yet approved or cleared by the [place] FDA and  has been authorized for detection and/or diagnosis of SARS-CoV-2 by FDA under an Emergency Use Authorization (EUA). This EUA will remain  in effect (meaning this test can be used) for the duration of the COVID-19 declaration under Section 564(b)(1) of the Act, 21 U.S.C.section 360bbb-3(b)(1), unless the authorization is terminated  or revoked sooner.       Influenza A by PCR NEGATIVE NEGATIVE Final   Influenza B by PCR NEGATIVE NEGATIVE Final    Comment: (NOTE) The Xpert Xpress SARS-CoV-2/FLU/RSV plus assay is intended as an aid in the diagnosis of influenza from Nasopharyngeal swab specimens and should not be used as a sole basis for treatment. Nasal washings and aspirates are unacceptable for  Xpert Xpress SARS-CoV-2/FLU/RSV testing.  Fact Sheet for Patients: EntrepreneurPulse.com.au  Fact Sheet for Healthcare Providers: IncredibleEmployment.be  This test is not yet approved or cleared by the [place] FDA and has been authorized for detection and/or diagnosis of SARS-CoV-2 by FDA under an Emergency Use Authorization (EUA). This EUA will remain in effect (meaning this test can be used) for the duration of the COVID-19 declaration under Section 564(b)(1) of the Act, 21 U.S.C. section 360bbb-3(b)(1), unless the authorization is terminated or revoked.  Performed at [facility], [address] [place], [place] [address]   MRSA Next Gen by PCR, Nasal     Status: None   Collection Time: 06/24/22  4:49  PM   Specimen: Nasal Mucosa; Nasal Swab  Result Value Ref Range Status   MRSA by PCR Next Gen NOT DETECTED NOT DETECTED Final    Comment: (NOTE) The GeneXpert MRSA Assay (FDA approved for NASAL specimens only), is one component of a comprehensive MRSA colonization surveillance program. It is not intended to diagnose MRSA infection nor to guide or monitor treatment for MRSA infections. Test performance is not FDA approved in patients less than 4 years old. Performed at [facility], [address] [place], [place] [address]     Coagulation Studies: No results for input(s): "LABPROT", "INR" in the last 72 hours.   Urinalysis: No results for input(s): "COLORURINE", "LABSPEC", "PHURINE", "GLUCOSEU", "HGBUR", "BILIRUBINUR", "KETONESUR", "PROTEINUR", "UROBILINOGEN", "NITRITE", "LEUKOCYTESUR" in the last 72 hours.  Invalid input(s): "APPERANCEUR"    Imaging: No results found.   Medications:    sodium chloride     sodium chloride Stopped (06/27/22 0300)   amiodarone 30 mg/hr (06/29/22 1326)   dextrose 30 mL/hr at 06/29/22 0104    aspirin EC  81 mg Oral Daily   atorvastatin  20 mg Oral q1800    carvedilol  6.25 mg Oral BID WC   Chlorhexidine Gluconate Cloth  6 each Topical Q0600   clopidogrel  75 mg Oral Daily   LORazepam  0.5 mg Intravenous Once   sevelamer carbonate  1,600 mg Oral TID WC   sodium chloride flush  10-40 mL Intracatheter Q12H   sodium chloride flush  3 mL Intravenous Q12H   sodium chloride, albuterol, hydrALAZINE, senna-docusate, sodium chloride flush, sodium chloride flush  Assessment/ Plan:  Kathleen Sharp is a 76 y.o.  female with past medical conditions including hyperlipidemia, hypertension, cardiomegaly, and end-stage renal disease on hemodialysis.  Patient presents to the emergency department with complaints of shortness of breath and has been admitted for Hyperkalemia [E87.5] SOB (shortness of breath) [R06.02] Tachyarrhythmia [R00.0] Volume overload [E87.70]  [facility]/M/F/left AVG/82 kg  Volume overload/hyperkalemia with end-stage renal disease on hemodialysis.  Potassium on ED arrival 5.9, increased to 6.2 after pharmacologic treatment. Patient hyperkalemia is now better            Chest x-ray showed moderate pulmonary edema with bilateral pleural effusions.    Patient did receive renal placement therapy yesterday but patient was restless and attempting to sit up/roll over causing dialysis machine alarms.            Patient is to receive renal placement therapy again today/Saturday   2. Anemia of chronic kidney disease Lab Results  Component Value Date   HGB 9.5 (L) 06/29/2022    Hemoglobin at goal  3. Secondary Hyperparathyroidism: with outpatient labs: PTH 798, phosphorus 8.5, calcium 8.0 on 04/29/22.   Lab Results  Component Value Date   CALCIUM 8.6 (L) 06/29/2022   PHOS 7.5 (H) 06/24/2022     Continue sevelamer ordered with meals.  4.  Hypertension with chronic kidney disease.  Home regimen includes carvedilol and lisinopril.  Remains on receiving carvedilol and amiodarone drip.  Blood pressure 150/93 during  dialysis  5.  NSTEMI with elevated troponin and ST depression with SVT on EKG.       Patient was earlier on IV heparin Patient is being closely followed by primary team as well as cardiology Patient is to have cardiac catheterization on Monday   Addendum Patient was seen on dialysis treatment Patient treatment was shortened as patient wanted to leave    LOS: 5 Nereida Schepp s  Amelda Hapke 10/14/20233:57 PM

## 2022-06-29 NOTE — Progress Notes (Signed)
Pt completed 2.15 hours of 3.5 hour HD treatment. Ended early AMA. Alert, no c/o, report to primary RN. Start: 0912 End: 1140 144ml fluid removed 45.9L BVP 83.2kg post bed weight No HD meds ordered

## 2022-06-29 NOTE — Progress Notes (Signed)
[facility]    SUBJECTIVE: Patient sleeping lethargic somnolent difficult to arouse.  Patient family in the room   Vitals:   06/29/22 1100 06/29/22 1130 06/29/22 1140 06/29/22 1147  BP: (!) $Remov'98/58 93/70 98/61 'VWZLFD$ 90/64  Pulse: 68 71 70 74  Resp: $Remo'18 20 20 18  'vVKQE$ Temp:   97.8 F (36.6 C)   TempSrc:  Oral Oral   SpO2: 93% 94% 93% 94%  Weight:      Height:         Intake/Output Summary (Last 24 hours) at 06/29/2022 1433 Last data filed at 06/29/2022 1140 Gross per 24 hour  Intake 198.77 ml  Output 100 ml  Net 98.77 ml      PHYSICAL EXAM  General: Well developed, well nourished, in no acute distress HEENT:  Normocephalic and atramatic Neck:  No JVD.  Lungs: Clear bilaterally to auscultation and percussion. Heart: HRRR . Normal S1 and S2 without gallops or murmurs.  Abdomen: Bowel sounds are positive, abdomen soft and non-tender  Msk:  Back normal, normal gait. Normal strength and tone for age. Extremities: No clubbing, cyanosis or edema.  Dialysis access arm Neuro: Somnolent lethargic difficult to arouse family states she did not get a lot of sleep last night Psych:  Good affect, responds appropriately   LABS: Basic Metabolic Panel: Recent Labs    06/28/22 0635 06/29/22 0428  NA 131* 130*  K 3.8 3.9  CL 90* 92*  CO2 27 26  GLUCOSE 165* 136*  BUN 39* 49*  CREATININE 6.92* 7.35*  CALCIUM 8.4* 8.6*   Liver Function Tests: Recent Labs    06/27/22 1045 06/28/22 0635  AST 169* 85*  ALT 342* 244*  ALKPHOS 69 61  BILITOT 0.8 0.4  PROT 6.6 6.0*  ALBUMIN 3.5 3.2*   No results for input(s): "LIPASE", "AMYLASE" in the last 72 hours. CBC: Recent Labs    06/27/22 1045 06/29/22 0428  WBC 15.4* 13.6*  HGB 10.7* 9.5*  HCT 33.8* 29.4*  MCV 78.8* 78.6*  PLT 230 202   Cardiac Enzymes: No results for input(s): "CKTOTAL", "CKMB", "CKMBINDEX", "TROPONINI" in the last 72 hours. BNP: Invalid input(s): "POCBNP" D-Dimer: No results for input(s): "DDIMER" in the last  72 hours. Hemoglobin A1C: No results for input(s): "HGBA1C" in the last 72 hours. Fasting Lipid Panel: No results for input(s): "CHOL", "HDL", "LDLCALC", "TRIG", "CHOLHDL", "LDLDIRECT" in the last 72 hours. Thyroid Function Tests: No results for input(s): "TSH", "T4TOTAL", "T3FREE", "THYROIDAB" in the last 72 hours.  Invalid input(s): "FREET3" Anemia Panel: No results for input(s): "VITAMINB12", "FOLATE", "FERRITIN", "TIBC", "IRON", "RETICCTPCT" in the last 72 hours.  No results found.   Echo preserved overall left ventricular function EF around 55% negative bubble study  TELEMETRY: Telemetry independently reviewed by me suggested normal sinus rhythm rate of about 60 nonspecific ST-T wave changes:  ASSESSMENT AND PLAN:  Principal Problem:   NSTEMI (non-ST elevated myocardial infarction) (HCC) Active Problems:   Anemia of chronic renal failure, stage 5 (HCC)   End stage renal disease (HCC)   Essential hypertension   Volume overload   Hyperkalemia   Pulmonary edema   Cardiac arrhythmia   Syncope and collapse   Metabolic encephalopathy  Plan Non-STEMI peak troponin 14,000 consider cardiac cath prior to discharge Status post anticoagulation with heparin for 72 hours then discontinued Agree with nephrology input with dialysis management and control of volume status BNP elevated consistent with heart failure recommend aggressive dialysis management Altered mental status of unclear etiology consider further evaluation  consider neurology input Currently maintained on aspirin Plavix precardiac cath Continue telemetry monitoring History of syncope with negative CT for new infarcts Episode of SVT potentially resulting in STEMI now needing further evaluation Metabolic encephalopathy unclear etiology consider further work-up including neurology Continue to monitor and correct electrolytes Agree with hypertension management control with Coreg lisinopril Consider adding nitrates for  anginal symptoms Cardiac cath reportedly tentatively scheduled for Monday   Yolonda Kida, MD, 06/29/2022 2:33 PM

## 2022-06-29 NOTE — Progress Notes (Signed)
Pt does not have a 1:1 sitter present. Spoke with Janett Billow, Agricultural consultant on 2A. Patient is slightly confused and restless. LPN at bedside. Safety maintained. Will continue to closely monitor.

## 2022-06-29 NOTE — Progress Notes (Signed)
Progress Note   Patient: Kathleen Sharp MKL:[id number] DOB: 24-Aug-1946 DOA: 07/15/2022     5 DOS: the patient was seen and examined on 06/29/2022   Brief hospital course: Ms. Akyia Borelli is a 76 year old female with history of end-stage renal disease on hemodialysis Monday Wednesday 12-10-2022, hypertension, cardiomegaly, hyperlipidemia, who presents emergency department for chief concerns of shortness of breath.  Patient was complaining of shortness of breath with associated flushed face and palpitations.  No chest pain, nausea or vomiting.  Initial vitals in the emergency department showed temperature of 97.5, respiration rate of 29, heart rate of 95, blood pressure 108/87, SPO2 of 96% on 4 L nasal cannula.  Serum sodium is 135, potassium 5.9, chloride of 97, bicarb 20, BUN of 63, serum creatinine of 9.33, GFR 4, nonfasting blood glucose 172, WBC 5.9, hemoglobin 11.8, platelets of 269.  Lactic acid is 4.1.  High sensitive troponin is 89.  COVID/influenza A/influenza B PCR negative.  ED treatment: Diltiazem 10 mg IV one-time dose, Lokelma p.o., insulin aspart 10 mg, D50, calcium gluconate, vancomycin and cefepime.  10/9: Patient overnight had another episode of dyspnea with flushed face and palpitations.  Found to have supraventricular tachycardia requiring IV diltiazem push.  Troponin increased to above 10,000, peaked at 14,207>>13,306.  Potassium at 6.1, BUN 17, creatinine 10.41, it yesterday 144, ALT 77 and anion gap of 16.  BNP >4500, D-dimer 2.66>>3.10. EKG with SVT, peaked T in anteroseptal and ST depression in lateral leads. Patient was started on heparin infusion by night on-call, cardiology and nephrology was consulted.  Going for dialysis this morning. Per patient she never missed any dialysis, last dialysis was on Saturday as she was unable to go for her 2022/12/10 session due to the death of a dear friend. Echocardiogram pending. Patient refused cardiac catheterization.  10/10:  Patient had 2 syncopal episodes, one last night and 1 this a.m. rapid response was called.  During this morning episode patient all of a sudden became unresponsive and appears very confused.  Initial vitals were stable.  Telemetry noted nonsustained V. tach followed by atrial fibrillation with RVR.  While preparing for amiodarone bolus patient converted back to sinus rhythm with heart rate in 60s.  CT chest and CTA was obtained to rule out CVA which was negative for any acute infarct and CTA of head and neck was negative for any significant large vessel stenosis. Her cardiologist was also consulted and she was placed on amiodarone infusion to prevent further arrhythmias. She also received 500 cc of bolus as blood pressure become softer little after this episode.  Hyperkalemia resolved.  Blood pressure little soft, Patient later agrees with cardiac catheterization which is scheduled for Thursday morning now.  Troponin at 11,321 after peaking at 14,207.  Echocardiogram with normal EF, indeterminate diastolic function, no regional wall motion abnormalities and severe calcification of aortic valve without any evidence of stenosis.  10/11: Patient again very lethargic but following some simple commands when seen during morning rounds.  Blood pressure remained on softer side but improved from yesterday.  Telemetry with some paired PVCs and bigeminy. Going for dialysis today.  Cardiac cath planned for tomorrow morning, n.p.o. after midnight.  We will continue with amiodarone and heparin infusion.  Patient will remain high risk for decompensation and other cardiac arrhythmias based on her underlying comorbidities and recent NSTEMI.    Palliative care was consulted.  10/12: Patient continues to be lethargic.  Overnight rapid response was called secondary to change in mental status.  ABG was done which showed elevated PCO2.  Patient was placed on BiPAP which she has been very noncompliant with.  However this  morning when I saw her she was able to identify her relatives at bedside and was a little bit more compliant in wearing the BiPAP.  Cardiology had planned for a heart catheterization today but secondary to her change in mental status this is being canceled.  10/13: No overnight concerns.  Patient had a shorter dialysis session today due to agitation and requesting to stop it so it was terminated prematurely.  Complaining of lower substernal pain. Discussed with cardiology and her heparin infusion will be discontinued as she is on heparin infusion for more than 72 hours now.  She was started on aspirin along with Plavix. Cardiac cath is not scheduled for Monday at 7:30 AM. She will continue on amiodarone infusion per cardiology as she continued to have transient tachyarrhythmias.  10/14: Patient had another session of dialysis with removal of 700 UF, becoming intermittently hypotensive, otherwise completed the session.  More alert and oriented today.   Had a long discussion with niece about her risk of deterioration and decompensation.  Echo is now planned for Monday.   Assessment and Plan: * NSTEMI (non-ST elevated myocardial infarction) ([place]) Significant increase in troponin, which peaked above 14,000, makes her in the range of NSTEMI.  EKG with ST depression in lateral leads along with SVT. Troponin continued to trend down, at 2872 today. Cardiology was consulted she was started on heparin infusion-which she continued more than 72 hours so it will be discontinued today. BNP markedly elevated above 4500 Cardiac catheterization on Thursday got canceled due to altered mental status and is now rescheduled for Monday at 7:30 AM. -Continue aspirin and Plavix -Continue to monitor  Cardiac arrhythmia Patient is experiencing transient, self-limiting cardiac arrhythmias where she became symptomatic.  Had 2 syncopal episode during current hospitalization.  Most likely secondary to recent  NSTEMI. Cardiology started her on amiodarone infusion. -Continue with telemetry monitoring  Syncope and collapse Patient had 2 syncopal episode in the past 24-hour.  Most likely secondary to cardiac arrhythmia as mentioned above. CT head was negative for any acute infarct.  Chronic lacunar infarcts noted. CTA head and neck was negative for any large vessel occlusion.  Some stenosis noted as mentioned in full report. -Continue to monitor  Pulmonary edema - Presumed secondary to SVT and NSTEMI - Received her dialysis on admission and going for her routine dialysis today.  Metabolic encephalopathy Due to increased somnolence and altered mental status, ABG was obtained which shows hypercarbia.  Most likely multiple reasons which include hypercarbia, underlying pulmonary edema with recent NSTEMI. Patient was placed on BiPAP overnight. Currently improved mental status at this time. -Continue to monitor  Volume overload - Presumed secondary to SVT and NSTEMI - Nephrology has been consulted for dialysis - Continue with oxygen-wean as tolerated  Hyperkalemia Resolved with dialysis. -Continue to monitor -Patient is high risk for cardiac arrhythmia  End stage renal disease [facility]) Patient received an extra session of dialysis today as she was unable to complete her routine yesterday due to some agitation. - Via left upper extremity AV fistula - Monday, Wednesday, Friday -Nephrology is on board -Continue with routine dialysis  Essential hypertension - Patient takes Coreg 6.25 mg twice daily, lisinopril 40 mg daily Blood pressure seems improving with intermittently mildly elevated readings.  Still had couple of softer blood pressure readings. -Keep holding home antihypertensives -Continue to monitor  Anemia  of chronic renal failure, stage 5 (HCC) Seems stable and at baseline. -Continue to monitor   Subjective: Patient was seen at dialysis unit when she just finished her dialysis.   Some concern of intermittent hypertension and UF goal was decreased to 700.  Denies any chest pain or shortness of breath.  Physical Exam: Vitals:   06/29/22 1100 06/29/22 1130 06/29/22 1140 06/29/22 1147  BP: (!) $Remov'98/58 93/70 98/61 'sCtzXg$ 90/64  Pulse: 68 71 70 74  Resp: $Remo'18 20 20 18  'qDNLy$ Temp:   97.8 F (36.6 C)   TempSrc:  Oral Oral   SpO2: 93% 94% 93% 94%  Weight:      Height:       General.  Ill-appearing elderly lady, in no acute distress. Pulmonary.  Lungs clear bilaterally, normal respiratory effort. CV.  Regular rate and rhythm, no JVD, rub or murmur. Abdomen.  Soft, nontender, nondistended, BS positive. CNS.  Alert and oriented .  No focal neurologic deficit. Extremities.  No edema, no cyanosis, pulses intact and symmetrical. Psychiatry.  Judgment and insight appears normal.   Data Reviewed: Prior data reviewed  Family Communication: Had a long discussion with 1 niece in the room and the other on phone.  We discussed about potential change of CODE STATUS, they will discuss among themselves and with patient.  Disposition: Status is: Inpatient Remains inpatient appropriate because: Severity of illness   Planned Discharge Destination: Home with Home Health  DVT prophylaxis.  Heparin infusion Time spent: 45 minutes  This record has been created using Systems analyst. Errors have been sought and corrected,but may not always be located. Such creation errors do not reflect on the standard of care.  Author: Lorella Nimrod, MD 06/29/2022 4:16 PM  For on call review [email or web address].

## 2022-06-29 NOTE — Progress Notes (Signed)
Safety sitter ordered due to pt being confused/ due to staffing - unable to provided 1:1 sitter at this time / safety rounder provided/ pt being transferred to HD for treatment/ pt calm and cooperative at time / informed HD nurse that 1:1 sitter not available and to call floor if pt becomes uncooperative / verbalized an understanding.

## 2022-06-30 ENCOUNTER — Inpatient Hospital Stay: Payer: Medicare HMO

## 2022-06-30 DIAGNOSIS — I214 Non-ST elevation (NSTEMI) myocardial infarction: Secondary | ICD-10-CM | POA: Diagnosis not present

## 2022-06-30 LAB — COMPREHENSIVE METABOLIC PANEL
ALT: 124 U/L — ABNORMAL HIGH (ref 0–44)
AST: 29 U/L (ref 15–41)
Albumin: 3.7 g/dL (ref 3.5–5.0)
Alkaline Phosphatase: 52 U/L (ref 38–126)
Anion gap: 11 (ref 5–15)
BUN: 30 mg/dL — ABNORMAL HIGH (ref 8–23)
CO2: 27 mmol/L (ref 22–32)
Calcium: 8.5 mg/dL — ABNORMAL LOW (ref 8.9–10.3)
Chloride: 94 mmol/L — ABNORMAL LOW (ref 98–111)
Creatinine, Ser: 5.87 mg/dL — ABNORMAL HIGH (ref 0.44–1.00)
GFR, Estimated: 7 mL/min — ABNORMAL LOW (ref >60)
Glucose, Bld: 110 mg/dL — ABNORMAL HIGH (ref 70–99)
Potassium: 4 mmol/L (ref 3.5–5.1)
Sodium: 132 mmol/L — ABNORMAL LOW (ref 135–145)
Total Bilirubin: 0.4 mg/dL (ref 0.3–1.2)
Total Protein: 6.4 g/dL — ABNORMAL LOW (ref 6.5–8.1)

## 2022-06-30 LAB — GLUCOSE, CAPILLARY
Glucose-Capillary: 119 mg/dL — ABNORMAL HIGH (ref 70–99)
Glucose-Capillary: 89 mg/dL (ref 70–99)
Glucose-Capillary: 132 mg/dL — ABNORMAL HIGH (ref 70–99)
Glucose-Capillary: 33 mg/dL — CL (ref 70–99)
Glucose-Capillary: 86 mg/dL (ref 70–99)
Glucose-Capillary: 130 mg/dL — ABNORMAL HIGH (ref 70–99)
Glucose-Capillary: 125 mg/dL — ABNORMAL HIGH (ref 70–99)

## 2022-06-30 LAB — CBC
HCT: 27.9 % — ABNORMAL LOW (ref 36.0–46.0)
Hemoglobin: 8.6 g/dL — ABNORMAL LOW (ref 12.0–15.0)
MCH: 24.9 pg — ABNORMAL LOW (ref 26.0–34.0)
MCHC: 30.8 g/dL (ref 30.0–36.0)
MCV: 80.6 fL (ref 80.0–100.0)
Platelets: 176 10*3/uL (ref 150–400)
RBC: 3.46 MIL/uL — ABNORMAL LOW (ref 3.87–5.11)
RDW: 16.5 % — ABNORMAL HIGH (ref 11.5–15.5)
WBC: 9.9 10*3/uL (ref 4.0–10.5)
nRBC: 1.3 % — ABNORMAL HIGH (ref 0.0–0.2)

## 2022-06-30 LAB — BLOOD GAS, ARTERIAL
Acid-Base Excess: 2.1 mmol/L — ABNORMAL HIGH (ref 0.0–2.0)
Bicarbonate: 28.7 mmol/L — ABNORMAL HIGH (ref 20.0–28.0)
O2 Saturation: 100 %
Patient temperature: 37
pCO2 arterial: 52 mmHg — ABNORMAL HIGH (ref 32–48)
pH, Arterial: 7.35 (ref 7.35–7.45)
pO2, Arterial: 196 mmHg — ABNORMAL HIGH (ref 83–108)

## 2022-06-30 LAB — TROPONIN I (HIGH SENSITIVITY): Troponin I (High Sensitivity): 2260 ng/L (ref <18)

## 2022-06-30 LAB — MAGNESIUM: Magnesium: 1.6 mg/dL — ABNORMAL LOW (ref 1.7–2.4)

## 2022-06-30 MED ORDER — ATROPINE SULFATE 1 MG/10ML IJ SOSY: 0.5000 mg | PREFILLED_SYRINGE | Freq: Once | INTRAMUSCULAR | Status: DC

## 2022-06-30 MED ORDER — ATROPINE SULFATE 1 MG/10ML IJ SOSY
PREFILLED_SYRINGE | INTRAMUSCULAR | Status: AC
  Filled 2022-06-30: qty 10

## 2022-06-30 MED ORDER — ATROPINE SULFATE 1 MG/10ML IJ SOSY
PREFILLED_SYRINGE | INTRAMUSCULAR | Status: AC
  Administered 2022-06-30: 0.5 mg via INTRAVENOUS
  Filled 2022-06-30: qty 10

## 2022-06-30 MED ORDER — SODIUM CHLORIDE 0.9 % IV BOLUS
250.0000 mL | Freq: Once | INTRAVENOUS | Status: AC
  Administered 2022-06-30: 250 mL via INTRAVENOUS

## 2022-06-30 MED ORDER — MAGNESIUM SULFATE 4 GM/100ML IV SOLN
4.0000 g | Freq: Once | INTRAVENOUS | Status: AC
  Administered 2022-06-30: 4 g via INTRAVENOUS
  Filled 2022-06-30: qty 100

## 2022-06-30 MED ORDER — ALBUMIN HUMAN 25 % IV SOLN
INTRAVENOUS | Status: AC
  Administered 2022-06-30: 25 g via INTRAVENOUS
  Filled 2022-06-30: qty 50

## 2022-06-30 MED ORDER — GUAIFENESIN 100 MG/5ML PO LIQD
5.0000 mL | ORAL | Status: DC | PRN
  Administered 2022-07-01 – 2022-07-04 (×6): 5 mL via ORAL
  Filled 2022-06-30 (×6): qty 10

## 2022-06-30 MED ORDER — ATROPINE SULFATE 1 MG/10ML IJ SOSY: 0.5000 mg | PREFILLED_SYRINGE | Freq: Once | INTRAMUSCULAR | Status: AC

## 2022-06-30 MED ORDER — NOREPINEPHRINE 4 MG/250ML-% IV SOLN
0.0000 ug/min | INTRAVENOUS | Status: DC
  Administered 2022-06-30: 2 ug/min via INTRAVENOUS
  Filled 2022-06-30: qty 250

## 2022-06-30 MED ORDER — GLUCAGON HCL RDNA (DIAGNOSTIC) 1 MG IJ SOLR
1.0000 mg | Freq: Once | INTRAMUSCULAR | Status: AC
  Administered 2022-06-30: 1 mg via INTRAVENOUS
  Filled 2022-06-30: qty 1

## 2022-06-30 MED ORDER — ALBUMIN HUMAN 25 % IV SOLN: 25.0000 g | Freq: Once | INTRAVENOUS | Status: AC

## 2022-06-30 NOTE — Progress Notes (Addendum)
Went to room to check pt's routine vitals , pt lethargic . Answers to name but falls right back to sleep. BP 70/55, repeated BP: 76/28. Provider B. Randol Kern notified via page. Called back and came to bedside. Pt had blood glucose reading of 103. See new orders. Will continue to monitor.   06/29/22 2127  Assess: MEWS Score  BP (!) 70/55  MAP (mmHg) (!) 53  Pulse Rate (!) 51  Resp 16  SpO2 90 %  O2 Device Nasal Cannula  O2 Flow Rate (L/min) 3 L/min  Assess: MEWS Score  MEWS Temp 0  MEWS Systolic 2  MEWS Pulse 0  MEWS RR 0  MEWS LOC 0  MEWS Score 2  MEWS Score Color Yellow  Assess: if the MEWS score is Yellow or Red  Were vital signs taken at a resting state? Yes  Focused Assessment Change from prior assessment (see assessment flowsheet)  Does the patient meet 2 or more of the SIRS criteria? No  MEWS guidelines implemented *See Row Information* Yes  Treat  MEWS Interventions Administered scheduled meds/treatments;Escalated (See documentation below);Consulted Respiratory Therapy  Pain Scale 0-10  Pain Score 0  Escalate  MEWS: Escalate Yellow: discuss with charge nurse/RN and consider discussing with provider and RRT  Notify: Charge Nurse/RN  Name of Charge Nurse/RN Notified Chief Operating Officer  Date Charge Nurse/RN Notified 06/30/22  Time Charge Nurse/RN Notified 2130  Notify: Provider  Provider Name/Title B.Randol Kern  Date Provider Notified 06/30/22  Time Provider Notified 2129  Method of Notification Page  Notification Reason Change in status (Pt lethargic along with low BP see flowsheet)  Provider response At bedside;See new orders  Date of Provider Response 06/30/22  Time of Provider Response 2135  Document  Patient Outcome Stabilized after interventions  Progress note created (see row info) Yes  Assess: SIRS CRITERIA  SIRS Temperature  0  SIRS Pulse 0  SIRS Respirations  0  SIRS WBC 1  SIRS Score Sum  1

## 2022-06-30 NOTE — Progress Notes (Signed)
Pt transported via BiPAP to ICU bed 10 without incident.

## 2022-06-30 NOTE — Consult Note (Signed)
NAME:  Kathleen Sharp, MRN:  [id number], DOB:  Oct 06, 1945, LOS: 6 ADMISSION DATE:  07/04/2022, CONSULTATION DATE:  06/30/22 REFERRING MD:  Manuela Schwartz  REASON FOR CONSULT:  Hypotension    HPI  76 y.o  female with significant PMH of HTN, HLD, Cardiomegaly, ESRD on HD MWF who presented to the ED with chief complaints of SOB   ED Course: Initial vital signs showed HR of 95 beats/minute, BP mm Hg, the RR 29 breaths/minute, and the oxygen saturation 96 % on  4L and a temperature of 97.13F.   Pertinent Labs/Diagnostics Findings: Chemistry:Na+/ K+: 135/5.9 Glucose:172  BUN/Cr.:63/9.33 CBC: WBC:5.9  Hgb:11.8 Plts: 269 Other Lab findings: Lactic acid: 4.1 COVID PCR: Negative, Troponin: 89  Imaging:  CXR>1. Moderate pulmonary edema. 2. Moderate left and small right pleural effusions. 3. Cardiomegaly.uconate, vancomycin and cefepime Medications Administered: Diltiazem 10 mg IV one-time dose for SVT, Lokelma p.o., insulin aspart 10 mg, D50, calcium glI  Patient was admitted to hospitalist service for further management. See significant events for hospital course  Past Medical History  HTN, HLD, Cardiomegaly, ESRD on HD MWF  Significant Hospital Events   10/8: Admitted to PCU with Acute respiratory failure in the setting of pulmonary edema. Patient went into SVT requiring Diltiazem IV push  and Hyperkalemia correction. She was also started on Heparin gtt for NSTEMI. 10/9:Cardiology consulted who recommended cath but patient declined. Started on Plavix pending Echo. Nephrology consulted s/p urgent HD for volume removal. Patient had a syncopal episode. EF normal on Echo. 10/10:Started on IV amiodarone gtt for nonsustained ventricular tachycardia followed by atrial fibrillation associated with dizziness spell.CT Chest/CTA negative for CVA 10/11:Post HD fluid removed 10/12: Rapid response called for altered mental status and hypoglycemia, VBG showed hypercapnia, patient placed on BiPAP.  Patient initially declined cath but later consented however due to change in mental status cardiac cath was cancelled. 10/13:Remained on Amiodarone gtt.  10/14:Pt completed 2.15 hours of 3.5 hour HD treatment. 700 ml removed patient became hypotensive but completed session. 10/15: Rapid response called for unresponsiveness, bradycardia and hypotension. Noted with cool extremities. Albumin administered, Amiodarone stopped and Atropine 0.5 mg administered x 2. Patient transferred to  the ICU. PCCM consulted. On arrival, art line placed which showed BP readings in the 140's systolic with MAP>99. Pressors held. Remains on BiPAP   Consults:  Nephrology Cardiology  Procedures:  10/15: Left femoral art line  Significant Diagnostic Tests:  06/25/22: CT head:   1. No evidence of acute intracranial abnormality. 2. Moderate chronic small ischemic disease within the cerebral white matter. 3. Small chronic lacunar infarct within the left caudate head. 4. Mild generalized cerebral atrophy. 5. Left sphenoid sinusitis.   06/25/22: CTA neck:   1. The common carotid and internal carotid arteries are patent within the neck without hemodynamically significant stenosis. Atherosclerotic plaque, bilaterally. 2. Vertebral arteries patent within the neck. Mild atherosclerotic narrowing at the origin of the dominant right vertebral artery. 3. Aortic Atherosclerosis (ICD10-I70.0) and Emphysema (ICD10-J43.9).   06/25/22: CTA head:   1. No intracranial large vessel occlusion is identified. 2. Intracranial atherosclerotic disease with multifocal stenoses, most notably as follows. 3. Severe stenosis within the left vertebral artery proximal V4 segment. 4. Mild-to-moderate stenosis within the paraclinoid right ICA.  06/25/22: CXR 1. Slightly improved aeration of bilateral lungs with persistent interstitial pulmonary opacities, most consistent with pulmonary edema. 2. Small bilateral pleural  effusions. 3. Cardiomegaly.    Micro Data:  10/8: SARS-CoV-2 PCR> negative 10/8: Influenza PCR>  negative 10/9: MRSA PCR>> Negative  Antimicrobials:  None  OBJECTIVE  Blood pressure 104/87, pulse (!) 47, temperature 97.6 F (36.4 C), temperature source Axillary, resp. rate 11, height $RemoveBe'5\' 2"'nGVIAiCld$  (1.575 m), weight 85.5 kg, SpO2 100 %.        Intake/Output Summary (Last 24 hours) at 06/30/2022 0557 Last data filed at 06/30/2022 0359 Gross per 24 hour  Intake 1271.27 ml  Output 100 ml  Net 1171.27 ml   Filed Weights   06/26/22 0604 06/26/22 1342 06/29/22 0415  Weight: 81.6 kg 83.6 kg 85.5 kg    Physical Examination  GENERAL: 76 year-old critically ill patient lying in the bed on BiPAP EYES: Pupils equal, round, reactive to light and accommodation. No scleral icterus. Extraocular muscles intact.  HEENT: Head atraumatic, normocephalic. Oropharynx and nasopharynx clear.  NECK:  Supple, no jugular venous distention. No thyroid enlargement, no tenderness.  LUNGS: Normal breath sounds bilaterally, no wheezing, rales,rhonchi or crepitation. No use of accessory muscles of respiration.  CARDIOVASCULAR: S1, S2 normal. No murmurs, rubs, or gallops.  ABDOMEN: Soft, nontender, nondistended. Bowel sounds present. No organomegaly or mass.  EXTREMITIES: No pedal edema, cyanosis, or clubbing.  NEUROLOGIC: Cranial nerves II through XII are intact.  Moves all extremities. Sensation intact. Gait not checked.  PSYCHIATRIC: The patient is on BIPAP unable to assess SKIN: No obvious rash, lesion, or ulcer.   Labs/imaging that I havepersonally reviewed  (right click and "Reselect all SmartList Selections" daily)     Labs   CBC: Recent Labs  Lab 07/09/2022 1615 06/18/2022 2350 06/25/22 0442 06/26/22 0621 06/27/22 0525 06/27/22 1045 06/29/22 0428  WBC 5.9   < > 14.8* 17.3* 14.4* 15.4* 13.6*  NEUTROABS 5.2  --   --   --   --   --   --   HGB 11.8*   < > 10.6* 10.6* 9.5* 10.7* 9.5*  HCT 38.5   < >  33.6* 33.2* 30.1* 33.8* 29.4*  MCV 82.4   < > 79.8* 79.6* 79.2* 78.8* 78.6*  PLT 269   < > 230 222 201 230 202   < > = values in this interval not displayed.    Basic Metabolic Panel: Recent Labs  Lab 06/24/22 0953 06/24/22 1330 06/24/22 2127 06/25/22 0442 06/26/22 0621 06/27/22 1045 06/28/22 0635 06/29/22 0428  NA 137  --  137 135 134* 134* 131* 130*  K 6.2*   < > 4.9 4.8 5.0 4.0 3.8 3.9  CL 99  --  90* 93* 92* 93* 90* 92*  CO2 21*  --  $R'24 28 25 25 27 26  'Fd$ GLUCOSE 72  --  137* 97 84 100* 165* 136*  BUN 76*  --  39* 45* 64* 31* 39* 49*  CREATININE 10.67*  --  6.68* 7.34* 8.41* 5.86* 6.92* 7.35*  CALCIUM 8.8*  --  8.6* 8.4* 8.6* 8.9 8.4* 8.6*  MG  --   --  1.8  --   --   --   --   --   PHOS 7.5*  --   --   --   --   --   --   --    < > = values in this interval not displayed.   GFR: Estimated Creatinine Clearance: 6.8 mL/min (A) (by C-G formula based on SCr of 7.35 mg/dL (H)). Recent Labs  Lab 07/06/2022 1615 06/20/2022 1858 06/20/2022 2350 06/26/22 0621 06/27/22 0525 06/27/22 1045 06/29/22 0428  WBC 5.9  --    < > 17.3*  14.4* 15.4* 13.6*  LATICACIDVEN 4.1* 2.6*  --   --   --   --   --    < > = values in this interval not displayed.    Liver Function Tests: Recent Labs  Lab 06/24/22 0537 06/24/22 0953 06/24/22 2127 06/27/22 1045 06/28/22 0635  AST 144*  --  144* 169* 85*  ALT 77*  --  118* 342* 244*  ALKPHOS 59  --  56 69 61  BILITOT 0.8  --  0.9 0.8 0.4  PROT 6.9  --  6.7 6.6 6.0*  ALBUMIN 3.6 3.6 3.4* 3.5 3.2*   No results for input(s): "LIPASE", "AMYLASE" in the last 168 hours. No results for input(s): "AMMONIA" in the last 168 hours.  ABG    Component Value Date/Time   HCO3 30.6 (H) 06/29/2022 2219   O2SAT 72.8 06/29/2022 2219     Coagulation Profile: Recent Labs  Lab 06/18/2022 2350  INR 1.3*    Cardiac Enzymes: No results for input(s): "CKTOTAL", "CKMB", "CKMBINDEX", "TROPONINI" in the last 168 hours.  HbA1C: No results found for:  "HGBA1C"  CBG: Recent Labs  Lab 06/29/22 0351 06/29/22 0811 06/29/22 1645 06/29/22 2144 06/30/22 0444  GLUCAP 137* 122* 128* 103* 119*    Review of Systems:   Uable tyo obtain due to altered mental status on BIPAP  Past Medical History  She,  has a past medical history of Anemia, Arthritis, Chronic kidney disease, Hypercholesterolemia, and Hypertension.   Surgical History    Past Surgical History:  Procedure Laterality Date   ABDOMINAL HYSTERECTOMY     AV FISTULA PLACEMENT Left 12/15/2015   Procedure: INSERTION OF ARTERIOVENOUS (AV) GORE-TEX GRAFT ARM             ( BRACHIAL AXILLARY GRAFT );  Surgeon: [name], MD;  Location: [facility];  Service: Vascular;  Laterality: Left;   CAPD INSERTION N/A 11/24/2015   Procedure: Exploratory laparoscopy;  Surgeon: [name], MD;  Location: [facility];  Service: Vascular;  Laterality: N/A;, attempted insertion but unable due to scar tissue   PERIPHERAL VASCULAR CATHETERIZATION Left 02/27/2016   Procedure: Thrombectomy;  Surgeon: [name], MD;  Location: [place] [facility];  Service: Cardiovascular;  Laterality: Left;   PERIPHERAL VASCULAR CATHETERIZATION Left 04/12/2016   Procedure: Thrombectomy;  Surgeon: [name], MD;  Location: [place] [facility];  Service: Cardiovascular;  Laterality: Left;   PERIPHERAL VASCULAR CATHETERIZATION Left 05/22/2016   Procedure: Thrombectomy;  Surgeon: [name], MD;  Location: [place] [facility];  Service: Cardiovascular;  Laterality: Left;   PERIPHERAL VASCULAR CATHETERIZATION N/A 05/22/2016   Procedure: A/V Shuntogram/Fistulagram;  Surgeon: [name], MD;  Location: [place] [facility];  Service: Cardiovascular;  Laterality: N/A;   PERIPHERAL VASCULAR CATHETERIZATION Left 06/21/2016   Procedure: Thrombectomy;  Surgeon: [name], MD;  Location: [place] [facility];  Service: Cardiovascular;  Laterality: Left;     Social History   reports that she  quit smoking about 9 years ago. Her smoking use included cigarettes. She has a 15.00 pack-year smoking history. She has never used smokeless tobacco. She reports that she does not drink alcohol and does not use drugs.   Family History   Her family history includes Heart disease in her mother; Kidney disease in her brother and father.   Allergies No Known Allergies   Home Medications  Prior to Admission medications   Medication Sig Start Date End Date Taking? Authorizing Provider  carvedilol (COREG) 6.25 MG tablet Take 6.25 mg by mouth 2 (two) times daily with a meal.   Yes [provider]  clopidogrel (PLAVIX) 75 MG tablet Take 75 mg by mouth daily.   Yes [provider]  lidocaine-prilocaine (EMLA) cream Apply 1 Application topically daily as needed (dialysis treatment).   Yes [provider]  lisinopril (ZESTRIL) 40 MG tablet Take 40 mg by mouth daily.   Yes [provider]  loratadine (CLARITIN) 10 MG tablet Take 10 mg by mouth daily.   Yes [provider]  Multiple Vitamins-Minerals (MULTIVITAMIN WITH MINERALS) tablet Take 1 tablet by mouth daily.   Yes [provider]  sevelamer carbonate (RENVELA) 800 MG tablet Take 1,600 mg by mouth 3 (three) times daily with meals.   Yes [provider]    Scheduled Meds:  aspirin EC  81 mg Oral Daily   atorvastatin  20 mg Oral q1800   atropine  0.5 mg Intravenous Once   carvedilol  6.25 mg Oral BID WC   Chlorhexidine Gluconate Cloth  6 each Topical Q0600   clopidogrel  75 mg Oral Daily   LORazepam  0.5 mg Intravenous Once   sevelamer carbonate  1,600 mg Oral TID WC   sodium chloride flush  10-40 mL Intracatheter Q12H   sodium chloride flush  3 mL Intravenous Q12H   Continuous Infusions:  sodium chloride     sodium chloride Stopped (06/27/22 0300)   dextrose 30 mL/hr at 06/30/22 0359   norepinephrine (LEVOPHED) Adult infusion     PRN Meds:.sodium chloride, albuterol,  hydrALAZINE, senna-docusate, sodium chloride flush, sodium chloride flush   Active Hospital Problem list       Assessment & Plan:  Acute Hypoxic Hypercapnic Respiratory Failure secondary to Pulmonary Edema -Supplemental O2 as needed to maintain O2 saturations 88 to 92% -BiPAP, wean as tolerated -High risk for intubation -Follow intermittent ABG and chest x-ray as needed -HD for volume removal -As needed bronchodilators   NSTEMI Hypertension now Hypotensive AFIB with RVR PMHx: CAD, HLD, HTN -Continuous cardiac monitoring -Vasopressors as needed to maintain MAP goal  -Trend Lactic acid  -Trend HS Troponin peaked at 1400 -BNP 4500 -Hold Amiodarone for bradycardia -Heparin gtt  X  72 hours STOPPED -DUAP therapy with aspirin and Plavix -Atorvastatin 20mg  PO daily -Cardiology following, appreciate input -Repeat 2D Echocardiogram showed EF 55% -*Cardiac catheterization on Thursday got canceled due to altered mental status and is now rescheduled for Monday at 7:30 AM  AKI on ESRD on HD MWF Hyperkalemia-RESOLVED -Monitor I&O's / urinary output -Follow BMP -Ensure adequate renal perfusion -Avoid nephrotoxic agents as able -Replace electrolytes as indicated -HD for volume removal -Nephrology following  Acute Metabolic Encephalopathy due to above -Provide supportive care -BiPAP to treat Hypercapnia -CT Head 10/10 negative for acute intracranial abnormality   Hypoglycemic Episodes -CBGs -Follow ICU hyper/hypoglycemia protocol  Best practice:  Diet:  NPO Pain/Anxiety/Delirium protocol (if indicated): No VAP protocol (if indicated): Not indicated DVT prophylaxis: Systemic AC GI prophylaxis: N/A Glucose control:  SSI Yes Central venous access:  N/A Arterial line:  Yes, and it is still needed Foley:  Yes, and it is still needed Mobility:  bed rest  PT consulted: N/A Last date of multidisciplinary goals of care discussion [06/30/22] Code Status:  full code Disposition:  ICU   = Goals of Care = Code Status Order: FULL   Primary Emergency Contact: [place] Wishes to pursue full aggressive treatment and intervention options, including CPR and intubation, but  goals of care will be addressed on going with family if that should become necessary.  Critical care time: 45 minutes       [name], DNP, CCRN, FNP-C, AGACNP-BC Acute Care Nurse Practitioner [facility]  PCCM on call pager [phone] until 7 am

## 2022-06-30 NOTE — Plan of Care (Signed)

## 2022-06-30 NOTE — Progress Notes (Signed)
Responded to Spiritual Care consult for Advanced Directive. Met with patient and family bedside. Gave the education for AD, advised them to contact chaplain on Monday morning to have document notarized.

## 2022-06-30 NOTE — Progress Notes (Signed)
Pt BP's low , pt lethargic . Provider Aldrich paged and now at bedside. See new orders .   06/30/22 0300  Vitals  Temp 97.6 F (36.4 C)  Temp Source Axillary  BP (!) 70/31  MAP (mmHg) (!) 44  BP Location Right Leg  BP Method Automatic  Patient Position (if appropriate) Lying  Pulse Rate (!) 47  Pulse Rate Source Monitor  ECG Heart Rate (!) 47  Resp 16  Level of Consciousness  Level of Consciousness Alert  MEWS COLOR  MEWS Score Color Yellow  Oxygen Therapy  SpO2 100 %  O2 Device Bi-PAP  MEWS Score  MEWS Temp 0  MEWS Systolic 2  MEWS Pulse 1  MEWS RR 0  MEWS LOC 0  MEWS Score 3

## 2022-06-30 NOTE — Progress Notes (Signed)
[facility] Cardiology    SUBJECTIVE: Patient more alert today denies any significant pain weakness resting comfortably with her daughters in the room   Vitals:   06/30/22 0630 06/30/22 0645 06/30/22 0700 06/30/22 0732  BP:   91/62   Pulse: 62 66  64  Resp: $Remo'19 18 19 17  'YNJYM$ Temp:      TempSrc:      SpO2: 95% 100% 100%   Weight:      Height:         Intake/Output Summary (Last 24 hours) at 06/30/2022 1034 Last data filed at 06/30/2022 0359 Gross per 24 hour  Intake 1271.27 ml  Output 100 ml  Net 1171.27 ml      PHYSICAL EXAM  General: Well developed, well nourished, in no acute distress HEENT:  Normocephalic and atramatic Neck:  No JVD.  Lungs: Clear bilaterally to auscultation and percussion. Heart: HRRR . Normal S1 and S2 without gallops or murmurs.  Abdomen: Bowel sounds are positive, abdomen soft and non-tender  Msk:  Back normal, normal gait. Normal strength and tone for age. Extremities: No clubbing, cyanosis or edema.   Neuro: Alert and oriented X 3. Psych:  Good affect, responds appropriately   LABS: Basic Metabolic Panel: Recent Labs    06/29/22 0428 06/30/22 0555  NA 130* 132*  K 3.9 4.0  CL 92* 94*  CO2 26 27  GLUCOSE 136* 110*  BUN 49* 30*  CREATININE 7.35* 5.87*  CALCIUM 8.6* 8.5*  MG  --  1.6*   Liver Function Tests: Recent Labs    06/28/22 0635 06/30/22 0555  AST 85* 29  ALT 244* 124*  ALKPHOS 61 52  BILITOT 0.4 0.4  PROT 6.0* 6.4*  ALBUMIN 3.2* 3.7   No results for input(s): "LIPASE", "AMYLASE" in the last 72 hours. CBC: Recent Labs    06/29/22 0428 06/30/22 0555  WBC 13.6* 9.9  HGB 9.5* 8.6*  HCT 29.4* 27.9*  MCV 78.6* 80.6  PLT 202 176   Cardiac Enzymes: No results for input(s): "CKTOTAL", "CKMB", "CKMBINDEX", "TROPONINI" in the last 72 hours. BNP: Invalid input(s): "POCBNP" D-Dimer: No results for input(s): "DDIMER" in the last 72 hours. Hemoglobin A1C: No results for input(s): "HGBA1C" in the last 72 hours. Fasting  Lipid Panel: No results for input(s): "CHOL", "HDL", "LDLCALC", "TRIG", "CHOLHDL", "LDLDIRECT" in the last 72 hours. Thyroid Function Tests: No results for input(s): "TSH", "T4TOTAL", "T3FREE", "THYROIDAB" in the last 72 hours.  Invalid input(s): "FREET3" Anemia Panel: No results for input(s): "VITAMINB12", "FOLATE", "FERRITIN", "TIBC", "IRON", "RETICCTPCT" in the last 72 hours.  No results found.   Echo preserved left ventricular function EF around 55%  TELEMETRY: Sinus bradycardia rate of 60:  ASSESSMENT AND PLAN:  Principal Problem:   NSTEMI (non-ST elevated myocardial infarction) ([place]) Active Problems:   Anemia of chronic renal failure, stage 5 (HCC)   End stage renal disease (HCC)   Essential hypertension   Volume overload   Hyperkalemia   Pulmonary edema   Cardiac arrhythmia   Syncope and collapse   Metabolic encephalopathy    Plan Unstable angina acute coronary syndrome consider invasive procedure cardiac cath tomorrow Non-STEMI elevated troponins now with recurrent chest pain symptoms consider cardiac cath Stage renal disease on dialysis continue management as per nephrology Hypertension continue current control patient has had episodes of hypotension recently Metabolic encephalopathy with confusion and delirium somewhat improved continue supportive care   Yolonda Kida, MD, 06/30/2022 10:34 AM

## 2022-06-30 NOTE — Progress Notes (Signed)
Pt transferred to ICU

## 2022-06-30 NOTE — Progress Notes (Signed)
Pt now on bipap and has a Actuary.

## 2022-06-30 NOTE — Progress Notes (Signed)
Progress Note   Patient: Kathleen Sharp TWS:[id number] DOB: 12/08/1945 DOA: 07/05/2022     6 DOS: the patient was seen and examined on 06/30/2022   Brief hospital course: Ms. Kathleen Sharp is a 76 year old female with history of end-stage renal disease on hemodialysis Monday Wednesday 2022/12/19, hypertension, cardiomegaly, hyperlipidemia, who presents emergency department for chief concerns of shortness of breath.  Patient was complaining of shortness of breath with associated flushed face and palpitations.  No chest pain, nausea or vomiting.  Initial vitals in the emergency department showed temperature of 97.5, respiration rate of 29, heart rate of 95, blood pressure 108/87, SPO2 of 96% on 4 L nasal cannula.  Serum sodium is 135, potassium 5.9, chloride of 97, bicarb 20, BUN of 63, serum creatinine of 9.33, GFR 4, nonfasting blood glucose 172, WBC 5.9, hemoglobin 11.8, platelets of 269.  Lactic acid is 4.1.  High sensitive troponin is 89.  COVID/influenza A/influenza B PCR negative.  ED treatment: Diltiazem 10 mg IV one-time dose, Lokelma p.o., insulin aspart 10 mg, D50, calcium gluconate, vancomycin and cefepime.  10/9: Patient overnight had another episode of dyspnea with flushed face and palpitations.  Found to have supraventricular tachycardia requiring IV diltiazem push.  Troponin increased to above 10,000, peaked at 14,207>>13,306.  Potassium at 6.1, BUN 17, creatinine 10.41, it yesterday 144, ALT 77 and anion gap of 16.  BNP >4500, D-dimer 2.66>>3.10. EKG with SVT, peaked T in anteroseptal and ST depression in lateral leads. Patient was started on heparin infusion by night on-call, cardiology and nephrology was consulted.  Going for dialysis this morning. Per patient she never missed any dialysis, last dialysis was on Saturday as she was unable to go for her 12/19/2022 session due to the death of a dear friend. Echocardiogram pending. Patient refused cardiac catheterization.  10/10:  Patient had 2 syncopal episodes, one last night and 1 this a.m. rapid response was called.  During this morning episode patient all of a sudden became unresponsive and appears very confused.  Initial vitals were stable.  Telemetry noted nonsustained V. tach followed by atrial fibrillation with RVR.  While preparing for amiodarone bolus patient converted back to sinus rhythm with heart rate in 60s.  CT chest and CTA was obtained to rule out CVA which was negative for any acute infarct and CTA of head and neck was negative for any significant large vessel stenosis. Her cardiologist was also consulted and she was placed on amiodarone infusion to prevent further arrhythmias. She also received 500 cc of bolus as blood pressure become softer little after this episode.  Hyperkalemia resolved.  Blood pressure little soft, Patient later agrees with cardiac catheterization which is scheduled for Thursday morning now.  Troponin at 11,321 after peaking at 14,207.  Echocardiogram with normal EF, indeterminate diastolic function, no regional wall motion abnormalities and severe calcification of aortic valve without any evidence of stenosis.  10/11: Patient again very lethargic but following some simple commands when seen during morning rounds.  Blood pressure remained on softer side but improved from yesterday.  Telemetry with some paired PVCs and bigeminy. Going for dialysis today.  Cardiac cath planned for tomorrow morning, n.p.o. after midnight.  We will continue with amiodarone and heparin infusion.  Patient will remain high risk for decompensation and other cardiac arrhythmias based on her underlying comorbidities and recent NSTEMI.    Palliative care was consulted.  10/12: Patient continues to be lethargic.  Overnight rapid response was called secondary to change in mental status.  ABG was done which showed elevated PCO2.  Patient was placed on BiPAP which she has been very noncompliant with.  However this  morning when I saw her she was able to identify her relatives at bedside and was a little bit more compliant in wearing the BiPAP.  Cardiology had planned for a heart catheterization today but secondary to her change in mental status this is being canceled.  10/13: No overnight concerns.  Patient had a shorter dialysis session today due to agitation and requesting to stop it so it was terminated prematurely.  Complaining of lower substernal pain. Discussed with cardiology and her heparin infusion will be discontinued as she is on heparin infusion for more than 72 hours now.  She was started on aspirin along with Plavix. Cardiac cath is not scheduled for Monday at 7:30 AM. She will continue on amiodarone infusion per cardiology as she continued to have transient tachyarrhythmias.  10/14: Patient had another session of dialysis with removal of 700 UF, becoming intermittently hypotensive, otherwise completed the session.  More alert and oriented today.   Had a long discussion with niece about her risk of deterioration and decompensation.  Cath is now planned for Monday.  10/15: Overnight patient became unresponsive, bradycardic and hypotensive.  Amiodarone infusion was discontinued, patient received atropine and was placed on Levophed.  Transferred to ICU.  A-line was placed, which showed normal blood pressure so pressors were held.  Patient was also placed on BiPAP.  A.m. labs with resolution of leukocytosis, hemoglobin decreased to 8.6 from 9.5 with no obvious bleeding.  Troponin continued to trend down, at 2260 today. Patient again became bradycardic and hypotensive after receiving morning dose of Coreg which was placed on hold. After talking with 2 nieces at bedside-CODE [place] changed to DNR with full scope of medical care.  Patient is high risk for deterioration, decompensation and mortality.  Decreased tolerance of dialysis along with cardiac arrhythmias with this recent NSTEMI. Palliative care  was also consulted.   Assessment and Plan: * NSTEMI (non-ST elevated myocardial infarction) ([place]) Significant increase in troponin, which peaked above 14,000, makes her in the range of NSTEMI.  EKG with ST depression in lateral leads along with SVT. Troponin continued to trend down, at 2260 today. Cardiology was consulted she was started on heparin infusion-which she continued more than 72 hours so it was discontinued BNP markedly elevated above 4500 Cardiac catheterization on Thursday got canceled due to altered mental status and is now rescheduled for Monday at 7:30 AM. -Continue aspirin and Plavix -Continue to monitor  Cardiac arrhythmia Patient is experiencing transient, self-limiting cardiac arrhythmias where she became symptomatic.  Had 2 syncopal episode during current hospitalization.  Most likely secondary to recent NSTEMI. Cardiology started her on amiodarone infusion, which was discontinued due to symptomatic bradycardia overnight requiring atropine. -Continue with telemetry monitoring  Syncope and collapse Delirium/intermittent altered mental status. Patient again became altered with hypotension and bradycardia requiring transfer to stepdown. Bradycardia improved after getting a dose of atropine.  A-line shows normal blood pressure, peripheral reading remains low, pressors were held. CT head was negative for any acute infarct.  Chronic lacunar infarcts noted. CTA head and neck was negative for any large vessel occlusion.  Some stenosis noted as mentioned in full report. -Continue to monitor -Palliative care consult.  Pulmonary edema - Presumed secondary to SVT and NSTEMI - Received her dialysis on admission and continuing routine dialysis at this time. -Intermittently becoming hypoxic and was placed on BiPAP.  Most  likely some low perfusion and not picking up saturation very well.  ABG without any hypoxia  Metabolic encephalopathy Patient is exhibiting signs of delirium  with intermittent change in mental status.  Underlying cardiac arrhythmias suspected.  She is becoming more confused after dialysis. Patient was placed on BiPAP overnight. Currently improved mental status at this time. -Continue to monitor  Volume overload - Presumed secondary to SVT and NSTEMI - Nephrology has been consulted for dialysis - Continue with oxygen-wean as tolerated  Hyperkalemia Resolved with dialysis. -Continue to monitor -Patient is high risk for cardiac arrhythmia  End stage renal disease [facility]) Patient received an extra session of dialysis today as she was unable to complete her routine yesterday due to some agitation. - Via left upper extremity AV fistula - Monday, Wednesday, Friday -Nephrology is on board -Continue with routine dialysis  Essential hypertension - Patient takes Coreg 6.25 mg twice daily, lisinopril 40 mg daily Blood pressure seems improving with intermittently mildly elevated readings.  Still had couple of softer blood pressure readings. -Keep holding home antihypertensives -Continue to monitor  Anemia of chronic renal failure, stage 5 (HCC) Some decrease in hemoglobin to 8.6, no obvious bleeding. -Continue to monitor -Transfuse if below 8   Subjective: Patient was seen and examined today.  She was alert and oriented.  Does not remember what happened overnight.  2 nieces at bedside.  Physical Exam: Vitals:   06/30/22 0630 06/30/22 0645 06/30/22 0700 06/30/22 0732  BP:   91/62   Pulse: 62 66  64  Resp: $Remo'19 18 19 17  'WovlR$ Temp:      TempSrc:      SpO2: 95% 100% 100%   Weight:      Height:       General.  Ill-appearing elderly lady, in no acute distress. Pulmonary.  Lungs clear bilaterally, normal respiratory effort. CV.  Regular rate and rhythm, no JVD, rub or murmur. Abdomen.  Soft, nontender, nondistended, BS positive. CNS.  Alert and oriented .  No focal neurologic deficit. Extremities.  No edema, no cyanosis, pulses intact and  symmetrical. Psychiatry.  Judgment and insight appears normal.   Data Reviewed: Prior data reviewed  Family Communication: Discussed with 2 nieces at bedside.  Disposition: Status is: Inpatient Remains inpatient appropriate because: Severity of illness   Planned Discharge Destination: Home with Home Health  DVT prophylaxis.  Heparin infusion Time spent: 50 minutes  This record has been created using Systems analyst. Errors have been sought and corrected,but may not always be located. Such creation errors do not reflect on the standard of care.  Author: Lorella Nimrod, MD 06/30/2022 12:53 PM  For on call review [email or web address].

## 2022-06-30 NOTE — Progress Notes (Signed)
       CROSS COVER NOTE  NAME: Kathleen Sharp MRN: [id number] DOB : Apr 10, 1946    Date of Service   06/29/2022  HPI/Events of Note   Patient who was admitted for NSTEMI on HD and has displayed metabolic encephalopathic behaviors each night she has had HD manifested with decreased or altered level of conscience and hypotensionl Has rspondind to albumin and glucose in the past; Tonight in addition to having this type of eveint in which she returned to her baseline mentation, she became hypotensive and brdycardic  Assessment and  Interventions   Assessment: Neuro - responds minimal to noxious stimuli CV brady 47, hypotension, cold extremities, cbg 103 Pulm - BIPAP stilll in place, difficult to obtain osygen saturation Plan: Albumin and 250 cc NS Disoncontinue Amiodarone Atropine 0.5 mg x2 Levophed  ICU consulted - discussed with Jonny Ruiz NP Transfer to ICU Family  Kyla Balzarine updated via phone        Kathlene Cote NP [facility]

## 2022-06-30 NOTE — Procedures (Signed)
Arterial Catheter Insertion Procedure Note  CATELIN MANTHE  [id number]  [birth year]  Date:06/30/22  Time:5:51 AM   Provider Performing: Karen Kays   Procedure: Insertion of Arterial Line [phone]) with US guidance ([id number])   Indication(s) Blood pressure monitoring and/or need for frequent ABGs  Consent Unable to obtain consent due to emergent nature of procedure.  Anesthesia None  Time Out Verified patient identification, verified procedure, site/side was marked, verified correct patient position, special equipment/implants available, medications/allergies/relevant history reviewed, required imaging and test results available.  Sterile Technique Maximal sterile technique including full sterile barrier drape, hand hygiene, sterile gown, sterile gloves, mask, hair covering, sterile ultrasound probe cover (if used).  Procedure Description Area of catheter insertion was cleaned with chlorhexidine and draped in sterile fashion. With real-time ultrasound guidance an arterial catheter was placed into the left femoral artery.  Appropriate arterial tracings confirmed on monitor.    Complications/Tolerance None; patient tolerated the procedure well.  EBL Minimal  Specimen(s) None   [name], DNP, CCRN, FNP-C, AGACNP-BC Acute Care & Family Nurse Practitioner  [facility]  See Amion for personal pager PCCM on call pager [phone] until 7 am

## 2022-06-30 NOTE — Progress Notes (Signed)
[facility]  ROUNDING NOTE   Subjective:   Kathleen Sharp is a 76 year old female with past medical conditions including hyperlipidemia, hypertension, cardiomegaly, and end-stage renal disease on hemodialysis.  Patient presents to the emergency department with complaints of shortness of breath and has been admitted for Hyperkalemia [E87.5] SOB (shortness of breath) [R06.02] Tachyarrhythmia [R00.0] Volume overload [E87.70]  Patient is known to our practice and receives outpatient dialysis treatments at [facility] on a MWF schedule, supervised by Dr. Candiss Norse.   Overnight events were reviewed Patient last night developed altered mental status, bradycardia and was hypotensive. Patient was transferred to ICU. Patient was seen today in ICU.  Patient did not offer any new specific complaint. Patient family she was feeling better than before I did review the data with ICU team as well  Objective:  Vital signs in last 24 hours:  Temp:  [97.6 F (36.4 C)-98 F (36.7 C)] 97.6 F (36.4 C) (10/15 0300) Pulse Rate:  [47-74] 64 (10/15 0732) Resp:  [11-25] 17 (10/15 0732) BP: (70-135)/(31-119) 91/62 (10/15 0700) SpO2:  [87 %-100 %] 100 % (10/15 0700) Arterial Line BP: (125-145)/(62-74) 137/68 (10/15 0700)  Weight change:  Filed Weights   06/26/22 0604 06/26/22 1342 06/29/22 0415  Weight: 81.6 kg 83.6 kg 85.5 kg    Intake/Output: I/O last 3 completed shifts: In: 4650 [I.V.:1470] Out: 100 [Other:100]   Intake/Output this shift:  No intake/output data recorded.  Physical Exam: General: NAD  Head: Normocephalic, atraumatic. Moist oral mucosal membranes  Eyes: Anicteric  Lungs:  Diminished in bases, normal effort, room air  Heart: Regular rate and rhythm  Abdomen:  Soft, nontender, obese  Extremities: Trace peripheral edema.  Neurologic: Nonfocal, moving all four extremities  Skin: No lesions  Access: Left AVG    Basic Metabolic Panel: Recent Labs  Lab  06/24/22 0953 06/24/22 1330 06/24/22 2127 06/25/22 0442 06/26/22 0621 06/27/22 1045 06/28/22 0635 06/29/22 0428 06/30/22 0555  NA 137  --  137   < > 134* 134* 131* 130* 132*  K 6.2*   < > 4.9   < > 5.0 4.0 3.8 3.9 4.0  CL 99  --  90*   < > 92* 93* 90* 92* 94*  CO2 21*  --  24   < > $R'25 25 27 26 27  'Ec$ GLUCOSE 72  --  137*   < > 84 100* 165* 136* 110*  BUN 76*  --  39*   < > 64* 31* 39* 49* 30*  CREATININE 10.67*  --  6.68*   < > 8.41* 5.86* 6.92* 7.35* 5.87*  CALCIUM 8.8*  --  8.6*   < > 8.6* 8.9 8.4* 8.6* 8.5*  MG  --   --  1.8  --   --   --   --   --  1.6*  PHOS 7.5*  --   --   --   --   --   --   --   --    < > = values in this interval not displayed.    Liver Function Tests: Recent Labs  Lab 06/24/22 0537 06/24/22 0953 06/24/22 2127 06/27/22 1045 06/28/22 0635 06/30/22 0555  AST 144*  --  144* 169* 85* 29  ALT 77*  --  118* 342* 244* 124*  ALKPHOS 59  --  56 69 61 52  BILITOT 0.8  --  0.9 0.8 0.4 0.4  PROT 6.9  --  6.7 6.6 6.0* 6.4*  ALBUMIN  3.6 3.6 3.4* 3.5 3.2* 3.7   No results for input(s): "LIPASE", "AMYLASE" in the last 168 hours. No results for input(s): "AMMONIA" in the last 168 hours.  CBC: Recent Labs  Lab 06/30/2022 1615 07/07/2022 2350 06/26/22 0621 06/27/22 0525 06/27/22 1045 06/29/22 0428 06/30/22 0555  WBC 5.9   < > 17.3* 14.4* 15.4* 13.6* 9.9  NEUTROABS 5.2  --   --   --   --   --   --   HGB 11.8*   < > 10.6* 9.5* 10.7* 9.5* 8.6*  HCT 38.5   < > 33.2* 30.1* 33.8* 29.4* 27.9*  MCV 82.4   < > 79.6* 79.2* 78.8* 78.6* 80.6  PLT 269   < > 222 201 230 202 176   < > = values in this interval not displayed.    Cardiac Enzymes: No results for input(s): "CKTOTAL", "CKMB", "CKMBINDEX", "TROPONINI" in the last 168 hours.  BNP: Invalid input(s): "POCBNP"  CBG: Recent Labs  Lab 06/29/22 0811 06/29/22 1645 06/29/22 2144 06/30/22 0444 06/30/22 0741  GLUCAP 122* 128* 103* 119* 47    Microbiology: Results for orders placed or performed during  the hospital encounter of 06/26/2022  Resp Panel by RT-PCR (Flu A&B, Covid) Anterior Nasal Swab     Status: None   Collection Time: 07/11/2022  5:13 PM   Specimen: Anterior Nasal Swab  Result Value Ref Range Status   SARS Coronavirus 2 by RT PCR NEGATIVE NEGATIVE Final    Comment: (NOTE) SARS-CoV-2 target nucleic acids are NOT DETECTED.  The SARS-CoV-2 RNA is generally detectable in upper respiratory specimens during the acute phase of infection. The lowest concentration of SARS-CoV-2 viral copies this assay can detect is 138 copies/mL. A negative result does not preclude SARS-Cov-2 infection and should not be used as the sole basis for treatment or other patient management decisions. A negative result may occur with  improper specimen collection/handling, submission of specimen other than nasopharyngeal swab, presence of viral mutation(s) within the areas targeted by this assay, and inadequate number of viral copies(<138 copies/mL). A negative result must be combined with clinical observations, patient history, and epidemiological information. The expected result is Negative.  Fact Sheet for Patients:  EntrepreneurPulse.com.au  Fact Sheet for Healthcare Providers:  IncredibleEmployment.be  This test is no t yet approved or cleared by the [place] FDA and  has been authorized for detection and/or diagnosis of SARS-CoV-2 by FDA under an Emergency Use Authorization (EUA). This EUA will remain  in effect (meaning this test can be used) for the duration of the COVID-19 declaration under Section 564(b)(1) of the Act, 21 U.S.C.section 360bbb-3(b)(1), unless the authorization is terminated  or revoked sooner.       Influenza A by PCR NEGATIVE NEGATIVE Final   Influenza B by PCR NEGATIVE NEGATIVE Final    Comment: (NOTE) The Xpert Xpress SARS-CoV-2/FLU/RSV plus assay is intended as an aid in the diagnosis of influenza from Nasopharyngeal swab  specimens and should not be used as a sole basis for treatment. Nasal washings and aspirates are unacceptable for Xpert Xpress SARS-CoV-2/FLU/RSV testing.  Fact Sheet for Patients: EntrepreneurPulse.com.au  Fact Sheet for Healthcare Providers: IncredibleEmployment.be  This test is not yet approved or cleared by the [place] FDA and has been authorized for detection and/or diagnosis of SARS-CoV-2 by FDA under an Emergency Use Authorization (EUA). This EUA will remain in effect (meaning this test can be used) for the duration of the COVID-19 declaration under Section 564(b)(1) of the Act,  21 U.S.C. section 360bbb-3(b)(1), unless the authorization is terminated or revoked.  Performed at [facility], [address] [place], [place] [address]   MRSA Next Gen by PCR, Nasal     Status: None   Collection Time: 06/24/22  4:49 PM   Specimen: Nasal Mucosa; Nasal Swab  Result Value Ref Range Status   MRSA by PCR Next Gen NOT DETECTED NOT DETECTED Final    Comment: (NOTE) The GeneXpert MRSA Assay (FDA approved for NASAL specimens only), is one component of a comprehensive MRSA colonization surveillance program. It is not intended to diagnose MRSA infection nor to guide or monitor treatment for MRSA infections. Test performance is not FDA approved in patients less than 74 years old. Performed at [facility], [address] [place], [place] [address]     Coagulation Studies: No results for input(s): "LABPROT", "INR" in the last 72 hours.   Urinalysis: No results for input(s): "COLORURINE", "LABSPEC", "PHURINE", "GLUCOSEU", "HGBUR", "BILIRUBINUR", "KETONESUR", "PROTEINUR", "UROBILINOGEN", "NITRITE", "LEUKOCYTESUR" in the last 72 hours.  Invalid input(s): "APPERANCEUR"    Imaging: No results found.   Medications:    sodium chloride     sodium chloride Stopped (06/27/22 0300)   dextrose 30 mL/hr at 06/30/22 0359    norepinephrine (LEVOPHED) Adult infusion      aspirin EC  81 mg Oral Daily   atorvastatin  20 mg Oral q1800   atropine  0.5 mg Intravenous Once   carvedilol  6.25 mg Oral BID WC   Chlorhexidine Gluconate Cloth  6 each Topical Q0600   clopidogrel  75 mg Oral Daily   LORazepam  0.5 mg Intravenous Once   sevelamer carbonate  1,600 mg Oral TID WC   sodium chloride flush  10-40 mL Intracatheter Q12H   sodium chloride flush  3 mL Intravenous Q12H   sodium chloride, albuterol, hydrALAZINE, senna-docusate, sodium chloride flush, sodium chloride flush     Assessment/ Plan:  Ms. Kathleen Sharp is a 76 y.o.  female with past medical conditions including hyperlipidemia, hypertension, cardiomegaly, and end-stage renal disease on hemodialysis.  Patient presents to the emergency department with complaints of shortness of breath and has been admitted for Hyperkalemia [E87.5] SOB (shortness of breath) [R06.02] Tachyarrhythmia [R00.0] Volume overload [E87.70]  [facility] [place]/M/F/left AVG/82 kg  Volume overload/hyperkalemia with end-stage renal disease on hemodialysis.  Potassium on ED arrival 5.9, increased to 6.2 after pharmacologic treatment. Patient hyperkalemia is now better            Chest x-ray showed moderate pulmonary edema with bilateral pleural effusions.    Patient did receive renal placement therapy yesterday but patient was restless and attempting to sit up/roll over causing dialysis machine alarms.              Patient was last dialyzed yesterday-Saturday  Chest x-ray done on June 30, 2022 IMPRESSION: 1. No acute findings.  No current evidence of pulmonary edema. 2. Mild lung base opacities consistent with atelectasis. No convincing pneumonia. 3. Stable cardiomegaly.               There was a question that patient could be in fluid overload so I asked for chest x-ray and reviewed patient chest x-ray from today.              Patient chest x-ray and examination does  not show need for immediate     dialysis today   2. Anemia of chronic kidney disease Lab Results  Component Value  Date   HGB 8.6 (L) 06/30/2022    Hemoglobin at goal  3. Secondary Hyperparathyroidism: with outpatient labs: PTH 798, phosphorus 8.5, calcium 8.0 on 04/29/22.   Lab Results  Component Value Date   CALCIUM 8.5 (L) 06/30/2022   PHOS 7.5 (H) 06/24/2022     Continue sevelamer ordered with meals.  4.  Hypotensive  patient blood pressure is on the lower side Patient is not on pressors This is more secondary to her cardiac issues Cardiology is following closely  5.  NSTEMI with elevated troponin and ST depression with SVT on EKG.       Patient was earlier on IV heparin Patient is being closely followed by primary team as well as cardiology Patient is to have cardiac catheterization on Monday     LOS: [place] s Autum Benfer 10/15/202310:01 AM

## 2022-07-01 ENCOUNTER — Other Ambulatory Visit (HOSPITAL_COMMUNITY): Payer: Self-pay

## 2022-07-01 ENCOUNTER — Encounter: Payer: Self-pay | Admitting: Cardiovascular Disease

## 2022-07-01 ENCOUNTER — Encounter: Payer: Self-pay | Admitting: Hematology and Oncology

## 2022-07-01 ENCOUNTER — Telehealth (HOSPITAL_COMMUNITY): Payer: Self-pay | Admitting: Pharmacy Technician

## 2022-07-01 ENCOUNTER — Encounter: Admission: EM | Disposition: E | Payer: Self-pay | Source: Home / Self Care | Attending: Internal Medicine

## 2022-07-01 HISTORY — PX: LEFT HEART CATH AND CORONARY ANGIOGRAPHY: CATH118249

## 2022-07-01 LAB — BASIC METABOLIC PANEL
Anion gap: 10 (ref 5–15)
BUN: 41 mg/dL — ABNORMAL HIGH (ref 8–23)
CO2: 28 mmol/L (ref 22–32)
Calcium: 8.5 mg/dL — ABNORMAL LOW (ref 8.9–10.3)
Chloride: 94 mmol/L — ABNORMAL LOW (ref 98–111)
Creatinine, Ser: 7.37 mg/dL — ABNORMAL HIGH (ref 0.44–1.00)
GFR, Estimated: 5 mL/min — ABNORMAL LOW (ref >60)
Glucose, Bld: 100 mg/dL — ABNORMAL HIGH (ref 70–99)
Potassium: 4.3 mmol/L (ref 3.5–5.1)
Sodium: 132 mmol/L — ABNORMAL LOW (ref 135–145)

## 2022-07-01 LAB — CBC WITH DIFFERENTIAL/PLATELET
Abs Immature Granulocytes: 0.18 10*3/uL — ABNORMAL HIGH (ref 0.00–0.07)
Basophils Absolute: 0 10*3/uL (ref 0.0–0.1)
Basophils Relative: 0 %
Eosinophils Absolute: 0.1 10*3/uL (ref 0.0–0.5)
Eosinophils Relative: 1 %
HCT: 25.8 % — ABNORMAL LOW (ref 36.0–46.0)
Hemoglobin: 8.1 g/dL — ABNORMAL LOW (ref 12.0–15.0)
Immature Granulocytes: 2 %
Lymphocytes Relative: 9 %
Lymphs Abs: 1 10*3/uL (ref 0.7–4.0)
MCH: 25.6 pg — ABNORMAL LOW (ref 26.0–34.0)
MCHC: 31.4 g/dL (ref 30.0–36.0)
MCV: 81.6 fL (ref 80.0–100.0)
Monocytes Absolute: 1.5 10*3/uL — ABNORMAL HIGH (ref 0.1–1.0)
Monocytes Relative: 14 %
Neutro Abs: 7.9 10*3/uL — ABNORMAL HIGH (ref 1.7–7.7)
Neutrophils Relative %: 74 %
Platelets: 162 10*3/uL (ref 150–400)
RBC: 3.16 MIL/uL — ABNORMAL LOW (ref 3.87–5.11)
RDW: 16.1 % — ABNORMAL HIGH (ref 11.5–15.5)
WBC: 10.7 10*3/uL — ABNORMAL HIGH (ref 4.0–10.5)
nRBC: 0.6 % — ABNORMAL HIGH (ref 0.0–0.2)

## 2022-07-01 LAB — PHOSPHORUS: Phosphorus: 5.2 mg/dL — ABNORMAL HIGH (ref 2.5–4.6)

## 2022-07-01 LAB — GLUCOSE, CAPILLARY
Glucose-Capillary: 124 mg/dL — ABNORMAL HIGH (ref 70–99)
Glucose-Capillary: 75 mg/dL (ref 70–99)
Glucose-Capillary: 84 mg/dL (ref 70–99)
Glucose-Capillary: 85 mg/dL (ref 70–99)
Glucose-Capillary: 97 mg/dL (ref 70–99)

## 2022-07-01 LAB — MAGNESIUM: Magnesium: 2.5 mg/dL — ABNORMAL HIGH (ref 1.7–2.4)

## 2022-07-01 SURGERY — LEFT HEART CATH AND CORONARY ANGIOGRAPHY
Anesthesia: Moderate Sedation

## 2022-07-01 MED ORDER — SODIUM CHLORIDE 0.9 % WEIGHT BASED INFUSION: 1.0000 mL/kg/h | INTRAVENOUS | Status: AC

## 2022-07-01 MED ORDER — ACETAMINOPHEN 325 MG PO TABS: 650.0000 mg | ORAL_TABLET | ORAL | Status: DC | PRN

## 2022-07-01 MED ORDER — LIDOCAINE HCL 1 % IJ SOLN
INTRAMUSCULAR | Status: AC
  Filled 2022-07-01: qty 20

## 2022-07-01 MED ORDER — HEPARIN (PORCINE) IN NACL 1000-0.9 UT/500ML-% IV SOLN
INTRAVENOUS | Status: DC | PRN
  Administered 2022-07-01: 1000 mL

## 2022-07-01 MED ORDER — ONDANSETRON HCL 4 MG/2ML IJ SOLN: 4.0000 mg | Freq: Four times a day (QID) | INTRAMUSCULAR | Status: DC | PRN

## 2022-07-01 MED ORDER — FENTANYL CITRATE (PF) 100 MCG/2ML IJ SOLN
INTRAMUSCULAR | Status: AC
  Filled 2022-07-01: qty 2

## 2022-07-01 MED ORDER — HYDRALAZINE HCL 20 MG/ML IJ SOLN: 10.0000 mg | INTRAMUSCULAR | Status: AC | PRN

## 2022-07-01 MED ORDER — HEPARIN (PORCINE) IN NACL 1000-0.9 UT/500ML-% IV SOLN
INTRAVENOUS | Status: AC
  Filled 2022-07-01: qty 500

## 2022-07-01 MED ORDER — ASPIRIN 81 MG PO CHEW
81.0000 mg | CHEWABLE_TABLET | ORAL | Status: AC
  Administered 2022-07-01: 81 mg via ORAL

## 2022-07-01 MED ORDER — ASPIRIN 81 MG PO CHEW
CHEWABLE_TABLET | ORAL | Status: AC
  Filled 2022-07-01: qty 1

## 2022-07-01 MED ORDER — LIDOCAINE HCL (PF) 1 % IJ SOLN
INTRAMUSCULAR | Status: DC | PRN
  Administered 2022-07-01: 20 mL

## 2022-07-01 MED ORDER — SODIUM CHLORIDE 0.9 % IV SOLN: INTRAVENOUS | Status: DC

## 2022-07-01 MED ORDER — MIDAZOLAM HCL 2 MG/2ML IJ SOLN
INTRAMUSCULAR | Status: DC | PRN
  Administered 2022-07-01: 1 mg via INTRAVENOUS

## 2022-07-01 MED ORDER — SODIUM CHLORIDE 0.9 % IV SOLN: 250.0000 mL | INTRAVENOUS | Status: DC | PRN

## 2022-07-01 MED ORDER — SODIUM CHLORIDE 0.9 % IV BOLUS
INTRAVENOUS | Status: AC | PRN
  Administered 2022-07-01: 250 mL via INTRAVENOUS

## 2022-07-01 MED ORDER — SODIUM CHLORIDE 0.9% FLUSH
3.0000 mL | Freq: Two times a day (BID) | INTRAVENOUS | Status: DC
  Administered 2022-07-01 – 2022-07-07 (×9): 3 mL via INTRAVENOUS

## 2022-07-01 MED ORDER — LABETALOL HCL 5 MG/ML IV SOLN: 10.0000 mg | INTRAVENOUS | Status: AC | PRN

## 2022-07-01 MED ORDER — FENTANYL CITRATE (PF) 100 MCG/2ML IJ SOLN
INTRAMUSCULAR | Status: DC | PRN
  Administered 2022-07-01: 25 ug via INTRAVENOUS

## 2022-07-01 MED ORDER — SODIUM CHLORIDE 0.9% FLUSH: 3.0000 mL | INTRAVENOUS | Status: DC | PRN

## 2022-07-01 MED ORDER — SODIUM CHLORIDE 0.9% FLUSH
3.0000 mL | INTRAVENOUS | Status: DC | PRN
  Administered 2022-07-06: 3 mL via INTRAVENOUS

## 2022-07-01 MED ORDER — FLUMAZENIL 0.5 MG/5ML IV SOLN
INTRAVENOUS | Status: AC
  Filled 2022-07-01: qty 5

## 2022-07-01 MED ORDER — IOHEXOL 300 MG/ML  SOLN
INTRAMUSCULAR | Status: DC | PRN
  Administered 2022-07-01: 170 mL

## 2022-07-01 MED ORDER — SODIUM CHLORIDE 0.9% FLUSH
3.0000 mL | Freq: Two times a day (BID) | INTRAVENOUS | Status: DC
  Administered 2022-07-01 – 2022-07-07 (×10): 3 mL via INTRAVENOUS

## 2022-07-01 MED ORDER — APIXABAN 5 MG PO TABS
5.0000 mg | ORAL_TABLET | Freq: Two times a day (BID) | ORAL | Status: DC
  Administered 2022-07-01 – 2022-07-02 (×4): 5 mg via ORAL
  Filled 2022-07-01 (×5): qty 1

## 2022-07-01 MED ORDER — FLUMAZENIL 0.5 MG/5ML IV SOLN
INTRAVENOUS | Status: DC | PRN
  Administered 2022-07-01: .2 mg via INTRAVENOUS

## 2022-07-01 MED ORDER — MIDAZOLAM HCL 2 MG/2ML IJ SOLN
INTRAMUSCULAR | Status: AC
  Filled 2022-07-01: qty 2

## 2022-07-01 SURGICAL SUPPLY — 14 items
CATH AMP RT 5F (CATHETERS) IMPLANT
CATH INFINITI 5 FR 3DRC (CATHETERS) IMPLANT
CATH INFINITI 5 FR JL3.5 (CATHETERS) IMPLANT
CATH INFINITI 5FR MULTPACK ANG (CATHETERS) IMPLANT
DEVICE CLOSURE MYNXGRIP 5F (Vascular Products) IMPLANT
NDL PERC 18GX7CM (NEEDLE) IMPLANT
NEEDLE PERC 18GX7CM (NEEDLE) ×1 IMPLANT
PACK CARDIAC CATH (CUSTOM PROCEDURE TRAY) ×1 IMPLANT
PANNUS RETENTION SYSTEM 2 PAD (MISCELLANEOUS) IMPLANT
PROTECTION STATION PRESSURIZED (MISCELLANEOUS) ×1
SET ATX SIMPLICITY (MISCELLANEOUS) IMPLANT
SHEATH AVANTI 5FR X 11CM (SHEATH) IMPLANT
STATION PROTECTION PRESSURIZED (MISCELLANEOUS) IMPLANT
WIRE GUIDERIGHT .035X150 (WIRE) IMPLANT

## 2022-07-01 NOTE — Progress Notes (Signed)
Discussed with Dr. Mortimer Fries that Arterial line isn't working intermittently and requiring frequent flushing to obtain good wave form. MD looked at wave form and stated "keep the aline for now since you have a good wave form." Patient is alert and asymptomatic. This RN along with dialysis RN attempted to obtain manual pressures multiple time with doppler and unsuccessful. RN made Dr. Cristela Felt aware of all mentioned above via secure chat and MD acknowledged. RN has been attempting to get cuff pressures on patient's leg and wrist during shift. Patient has very weak palpable pulses.

## 2022-07-01 NOTE — Progress Notes (Signed)
Progress Note   Patient: Kathleen Sharp DQQ:[id number] DOB: [birth year] DOA: 07/10/2022     7 DOS: the patient was seen and examined on 07/16/2022   Brief hospital course: Kathleen Sharp is a 76 year old female with history of end-stage renal disease on hemodialysis Monday Wednesday 2022-12-24, hypertension, cardiomegaly, hyperlipidemia, who presents emergency department for chief concerns of shortness of breath.  Patient was complaining of shortness of breath with associated flushed face and palpitations.  No chest pain, nausea or vomiting.  Initial vitals in the emergency department showed temperature of 97.5, respiration rate of 29, heart rate of 95, blood pressure 108/87, SPO2 of 96% on 4 L nasal cannula.  Serum sodium is 135, potassium 5.9, chloride of 97, bicarb 20, BUN of 63, serum creatinine of 9.33, GFR 4, nonfasting blood glucose 172, WBC 5.9, hemoglobin 11.8, platelets of 269.  Lactic acid is 4.1.  High sensitive troponin is 89.  COVID/influenza A/influenza B PCR negative.  ED treatment: Diltiazem 10 mg IV one-time dose, Lokelma p.o., insulin aspart 10 mg, D50, calcium gluconate, vancomycin and cefepime.  10/9: Patient overnight had another episode of dyspnea with flushed face and palpitations.  Found to have supraventricular tachycardia requiring IV diltiazem push.  Troponin increased to above 10,000, peaked at 14,207>>13,306.  Potassium at 6.1, BUN 17, creatinine 10.41, it yesterday 144, ALT 77 and anion gap of 16.  BNP >4500, D-dimer 2.66>>3.10. EKG with SVT, peaked T in anteroseptal and ST depression in lateral leads. Patient was started on heparin infusion by night on-call, cardiology and nephrology was consulted.  Going for dialysis this morning. Per patient she never missed any dialysis, last dialysis was on Saturday as she was unable to go for her 2022-12-24 session due to the death of a dear friend. Echocardiogram pending. Patient refused cardiac catheterization.  10/10:  Patient had 2 syncopal episodes, one last night and 1 this a.m. rapid response was called.  During this morning episode patient all of a sudden became unresponsive and appears very confused.  Initial vitals were stable.  Telemetry noted nonsustained V. tach followed by atrial fibrillation with RVR.  While preparing for amiodarone bolus patient converted back to sinus rhythm with heart rate in 60s.  CT chest and CTA was obtained to rule out CVA which was negative for any acute infarct and CTA of head and neck was negative for any significant large vessel stenosis. Her cardiologist was also consulted and she was placed on amiodarone infusion to prevent further arrhythmias. She also received 500 cc of bolus as blood pressure become softer little after this episode.  Hyperkalemia resolved.  Blood pressure little soft, Patient later agrees with cardiac catheterization which is scheduled for Thursday morning now.  Troponin at 11,321 after peaking at 14,207.  Echocardiogram with normal EF, indeterminate diastolic function, no regional wall motion abnormalities and severe calcification of aortic valve without any evidence of stenosis.  10/11: Patient again very lethargic but following some simple commands when seen during morning rounds.  Blood pressure remained on softer side but improved from yesterday.  Telemetry with some paired PVCs and bigeminy. Going for dialysis today.  Cardiac cath planned for tomorrow morning, n.p.o. after midnight.  We will continue with amiodarone and heparin infusion.  Patient will remain high risk for decompensation and other cardiac arrhythmias based on her underlying comorbidities and recent NSTEMI.    Palliative care was consulted.  10/12: Patient continues to be lethargic.  Overnight rapid response was called secondary to change in mental status.  ABG was done which showed elevated PCO2.  Patient was placed on BiPAP which she has been very noncompliant with.  However this  morning when I saw her she was able to identify her relatives at bedside and was a little bit more compliant in wearing the BiPAP.  Cardiology had planned for a heart catheterization today but secondary to her change in mental status this is being canceled.  10/13: No overnight concerns.  Patient had a shorter dialysis session today due to agitation and requesting to stop it so it was terminated prematurely.  Complaining of lower substernal pain. Discussed with cardiology and her heparin infusion will be discontinued as she is on heparin infusion for more than 72 hours now.  She was started on aspirin along with Plavix. Cardiac cath is not scheduled for Monday at 7:30 AM. She will continue on amiodarone infusion per cardiology as she continued to have transient tachyarrhythmias.  10/14: Patient had another session of dialysis with removal of 700 UF, becoming intermittently hypotensive, otherwise completed the session.  More alert and oriented today.   Had a long discussion with niece about her risk of deterioration and decompensation.  Cath is now planned for Monday.  10/15: Overnight patient became unresponsive, bradycardic and hypotensive.  Amiodarone infusion was discontinued, patient received atropine and was placed on Levophed.  Transferred to ICU.  A-line was placed, which showed normal blood pressure so pressors were held.  Patient was also placed on BiPAP.  A.m. labs with resolution of leukocytosis, hemoglobin decreased to 8.6 from 9.5 with no obvious bleeding.  Troponin continued to trend down, at 2260 today. Patient again became bradycardic and hypotensive after receiving morning dose of Coreg which was placed on hold. After talking with 2 nieces at bedside-CODE [place] changed to DNR with full scope of medical care.  Patient is high risk for deterioration, decompensation and mortality.  Decreased tolerance of dialysis along with cardiac arrhythmias with this recent NSTEMI. Palliative care  was also consulted.  10/16: Patient's blood pressure continues to be borderline.  Patient however underwent heart catheterization which was showing nonobstructive coronary artery disease and echocardiogram showing preserved ejection fraction of 55%.  Patient is currently on 3 L of oxygen via nasal cannula going to undergo hemodialysis.  Metabolic encephalopathy has completely cleared patient is alert oriented and back to baseline.  Assessment and Plan: * NSTEMI (non-ST elevated myocardial infarction) ([place]) Significant increase in troponin, which peaked above 14,000, makes her in the range of NSTEMI.  EKG with ST depression in lateral leads along with SVT. Troponin continued to trend down, at 2260 today. Cardiology was consulted she was started on heparin infusion-which she continued more than 72 hours so it was discontinued BNP markedly elevated above 4500 Cardiac catheterization on Thursday got canceled due to altered mental status and is now rescheduled for Monday at 7:30 AM.  Patient underwent cardiac catheterization today on 10/16 which revealed nonobstructive coronary artery disease.  Left ventricular ejection fraction on echo was 55% and sent left ventriculogram was not done. - Cardiologist recommended to discontinue aspirin and continue Plavix and Eliquis added because patient had an episode of atrial fibrillation. -Continue to monitor -Patient currently does not have any active symptoms.  Blood pressure currently however is slightly borderline.  Cardiac arrhythmia Patient is experiencing transient, self-limiting cardiac arrhythmias where she became symptomatic.  Had 2 syncopal episode during current hospitalization.  Most likely secondary to recent NSTEMI. Cardiology started her on amiodarone infusion, which was discontinued due to symptomatic  bradycardia overnight requiring atropine. -Continue with telemetry monitoring -Patient is started on Eliquis by cardiology now.  Syncope and  collapse Delirium/intermittent altered mental status. Patient again became altered with hypotension and bradycardia requiring transfer to stepdown. Bradycardia improved after getting a dose of atropine.  A-line shows normal blood pressure, peripheral reading remains low, pressors were held. CT head was negative for any acute infarct.  Chronic lacunar infarcts noted. CTA head and neck was negative for any large vessel occlusion.  Some stenosis noted as mentioned in full report. -Continue to monitor   Pulmonary edema - Presumed secondary to SVT and NSTEMI - Received her dialysis on admission and continuing routine dialysis at this time. -Intermittently becoming hypoxic and was placed on BiPAP.  Most likely some low perfusion and not picking up saturation very well.  ABG without any hypoxia -He is currently on 3 L of oxygen via nasal cannula.  She is going to undergo hemodialysis today.  Metabolic encephalopathy Patient is exhibiting signs of delirium with intermittent change in mental status.  Underlying cardiac arrhythmias suspected.  She is becoming more confused after dialysis. Patient was placed on BiPAP overnight. Encephalopathy resolved currently patient is able to converse normally and back to baseline.   Volume overload - Presumed secondary to SVT and NSTEMI - Nephrology has been consulted for dialysis - Continue with oxygen-wean as tolerated  Hyperkalemia Resolved with dialysis. -Continue to monitor -Patient is high risk for cardiac arrhythmia  End stage renal disease [facility]) Patient received an extra session of dialysis today as she was unable to complete her routine yesterday due to some agitation. - Via left upper extremity AV fistula - Monday, Wednesday, Friday -Nephrology is on board -Continue with routine dialysis, patient will undergo HD today  Essential hypertension - Patient takes Coreg 6.25 mg twice daily, lisinopril 40 mg daily Blood pressure seems improving  with intermittently mildly elevated readings. - Currently blood pressure is still borderline -Keep holding home antihypertensives -Continue to monitor  Anemia of chronic renal failure, stage 5 (HCC) Some decrease in hemoglobin to 8.6, no obvious bleeding. -Continue to monitor -Transfuse if below 8   Subjective: Patient was seen and examined today.  She was alert and oriented.  She denied having any chest pain or shortness of breath. Physical Exam: Vitals:   07/12/2022 1545 06/17/2022 1547 06/21/2022 1600 07/09/2022 1610  BP:  (!) 85/59 (!) 78/51 (!) 78/51  Pulse: 60   (!) 52  Resp: (!) 22  (!) 21   Temp:  (!) 97.5 F (36.4 C)    TempSrc:  Oral    SpO2: 100%     Weight:      Height:       General.  Ill-appearing elderly lady, in no acute distress. Pulmonary.  Lungs clear bilaterally, normal respiratory effort. CV.  Regular rate and rhythm, no JVD, rub or murmur. Abdomen.  Soft, nontender, nondistended, BS positive. CNS.  Alert and oriented .  No focal neurologic deficit. Extremities.  No edema, no cyanosis, pulses intact and symmetrical. Psychiatry.  Judgment and insight appears normal.   Data Reviewed: Prior data reviewed  Family Communication: Discussed with 2 nieces at bedside.  Disposition: Status is: Inpatient Remains inpatient appropriate because: Severity of illness   Planned Discharge Destination: Home with Home Health  DVT prophylaxis.  Heparin infusion Time spent: 50 minutes  This record has been created using Systems analyst. Errors have been sought and corrected,but may not always be located. Such creation errors do not  reflect on the standard of care.  Author: Carlyle Lipa, MD 06/16/2022 4:18 PM  For on call review [email or web address].

## 2022-07-01 NOTE — Telephone Encounter (Signed)
Pharmacy Patient Advocate Encounter  Insurance verification completed.    The patient is insured through [employer] Medicare Part D   The patient is currently admitted and ran test claims for the following: Eliquis .  Copays and coinsurance results were relayed to Inpatient clinical team.      

## 2022-07-01 NOTE — Progress Notes (Signed)
   07/02/2022 1500  Clinical Encounter Type  Visited With Patient and family together  Visit Type Follow-up   HCPOA completed.

## 2022-07-01 NOTE — TOC Benefit Eligibility Note (Signed)
Patient Advocate Encounter  Insurance verification completed.    The patient is currently admitted and upon discharge could be taking Eliquis 5 mg.  The current 30 day co-pay is $45.00.   The patient is insured through [employer] Medicare Part D     Yaslin Kirtley, CPhT Pharmacy Patient Advocate Specialist [facility] Patient Advocate Team Direct Number: [phone]  Fax: [phone]        

## 2022-07-01 NOTE — Progress Notes (Addendum)
[facility]  ROUNDING NOTE   Subjective:   Kathleen Sharp is a 76 year old female with past medical conditions including hyperlipidemia, hypertension, cardiomegaly, and end-stage renal disease on hemodialysis.  Patient presents to the emergency department with complaints of shortness of breath and has been admitted for Hyperkalemia [E87.5] SOB (shortness of breath) [R06.02] Tachyarrhythmia [R00.0] Volume overload [E87.70]  Patient is known to our practice and receives outpatient dialysis treatments at [facility] on a MWF schedule, supervised by Dr. Candiss Norse.   Overnight events were reviewed Patient last night developed altered mental status, bradycardia and was hypotensive. Patient was transferred to ICU. Patient was seen today in ICU.  Patient did not offer any new specific complaint. Patient family she was feeling better than before I did review the data with ICU team as well  Objective:  Vital signs in last 24 hours:  Temp:  [98.8 F (37.1 C)-99 F (37.2 C)] 98.8 F (37.1 C) (10/16 0400) Pulse Rate:  [52-69] 67 (10/16 0700) Resp:  [14-33] 21 (10/16 0700) BP: (61-119)/(19-93) 105/93 (10/16 0700) SpO2:  [78 %-100 %] 93 % (10/16 0700) Arterial Line BP: (83-144)/(43-141) 112/58 (10/16 0700)  Weight change:  Filed Weights   06/26/22 0604 06/26/22 1342 06/29/22 0415  Weight: 81.6 kg 83.6 kg 85.5 kg    Intake/Output: I/O last 3 completed shifts: In: 2256.3 [P.O.:720; I.V.:1436.3; IV Piggyback:100] Out: -    Intake/Output this shift:  No intake/output data recorded.  Physical Exam: General: NAD  Head: Normocephalic, atraumatic. Moist oral mucosal membranes  Eyes: Anicteric  Lungs:  Diminished in bases, normal effort, room air  Heart: Regular rate and rhythm  Abdomen:  Soft, nontender, obese  Extremities: Trace peripheral edema.  Neurologic: Nonfocal, moving all four extremities  Skin: No lesions  Access: Left AVG    Basic Metabolic  Panel: Recent Labs  Lab 06/24/22 0953 06/24/22 1330 06/24/22 2127 06/25/22 0442 06/27/22 1045 06/28/22 0635 06/29/22 0428 06/30/22 0555 07/12/2022 0403  NA 137  --  137   < > 134* 131* 130* 132* 132*  K 6.2*   < > 4.9   < > 4.0 3.8 3.9 4.0 4.3  CL 99  --  90*   < > 93* 90* 92* 94* 94*  CO2 21*  --  24   < > $R'25 27 26 27 28  'IJ$ GLUCOSE 72  --  137*   < > 100* 165* 136* 110* 100*  BUN 76*  --  39*   < > 31* 39* 49* 30* 41*  CREATININE 10.67*  --  6.68*   < > 5.86* 6.92* 7.35* 5.87* 7.37*  CALCIUM 8.8*  --  8.6*   < > 8.9 8.4* 8.6* 8.5* 8.5*  MG  --   --  1.8  --   --   --   --  1.6* 2.5*  PHOS 7.5*  --   --   --   --   --   --   --  5.2*   < > = values in this interval not displayed.    Liver Function Tests: Recent Labs  Lab 06/24/22 0953 06/24/22 2127 06/27/22 1045 06/28/22 0635 06/30/22 0555  AST  --  144* 169* 85* 29  ALT  --  118* 342* 244* 124*  ALKPHOS  --  56 69 61 52  BILITOT  --  0.9 0.8 0.4 0.4  PROT  --  6.7 6.6 6.0* 6.4*  ALBUMIN 3.6 3.4* 3.5 3.2* 3.7   No  results for input(s): "LIPASE", "AMYLASE" in the last 168 hours. No results for input(s): "AMMONIA" in the last 168 hours.  CBC: Recent Labs  Lab 06/27/22 0525 06/27/22 1045 06/29/22 0428 06/30/22 0555 06/27/2022 0403  WBC 14.4* 15.4* 13.6* 9.9 10.7*  NEUTROABS  --   --   --   --  7.9*  HGB 9.5* 10.7* 9.5* 8.6* 8.1*  HCT 30.1* 33.8* 29.4* 27.9* 25.8*  MCV 79.2* 78.8* 78.6* 80.6 81.6  PLT 201 230 202 176 162    Cardiac Enzymes: No results for input(s): "CKTOTAL", "CKMB", "CKMBINDEX", "TROPONINI" in the last 168 hours.  BNP: Invalid input(s): "POCBNP"  CBG: Recent Labs  Lab 06/30/22 1156 06/30/22 1200 06/30/22 1552 06/30/22 2030 06/29/2022 0030  GLUCAP 86 132* 130* 125* 26*    Microbiology: Results for orders placed or performed during the hospital encounter of 07/15/2022  Resp Panel by RT-PCR (Flu A&B, Covid) Anterior Nasal Swab     Status: None   Collection Time: 06/26/2022  5:13 PM    Specimen: Anterior Nasal Swab  Result Value Ref Range Status   SARS Coronavirus 2 by RT PCR NEGATIVE NEGATIVE Final    Comment: (NOTE) SARS-CoV-2 target nucleic acids are NOT DETECTED.  The SARS-CoV-2 RNA is generally detectable in upper respiratory specimens during the acute phase of infection. The lowest concentration of SARS-CoV-2 viral copies this assay can detect is 138 copies/mL. A negative result does not preclude SARS-Cov-2 infection and should not be used as the sole basis for treatment or other patient management decisions. A negative result may occur with  improper specimen collection/handling, submission of specimen other than nasopharyngeal swab, presence of viral mutation(s) within the areas targeted by this assay, and inadequate number of viral copies(<138 copies/mL). A negative result must be combined with clinical observations, patient history, and epidemiological information. The expected result is Negative.  Fact Sheet for Patients:  EntrepreneurPulse.com.au  Fact Sheet for Healthcare Providers:  IncredibleEmployment.be  This test is no t yet approved or cleared by the [place] FDA and  has been authorized for detection and/or diagnosis of SARS-CoV-2 by FDA under an Emergency Use Authorization (EUA). This EUA will remain  in effect (meaning this test can be used) for the duration of the COVID-19 declaration under Section 564(b)(1) of the Act, 21 U.S.C.section 360bbb-3(b)(1), unless the authorization is terminated  or revoked sooner.       Influenza A by PCR NEGATIVE NEGATIVE Final   Influenza B by PCR NEGATIVE NEGATIVE Final    Comment: (NOTE) The Xpert Xpress SARS-CoV-2/FLU/RSV plus assay is intended as an aid in the diagnosis of influenza from Nasopharyngeal swab specimens and should not be used as a sole basis for treatment. Nasal washings and aspirates are unacceptable for Xpert Xpress  SARS-CoV-2/FLU/RSV testing.  Fact Sheet for Patients: EntrepreneurPulse.com.au  Fact Sheet for Healthcare Providers: IncredibleEmployment.be  This test is not yet approved or cleared by the [place] FDA and has been authorized for detection and/or diagnosis of SARS-CoV-2 by FDA under an Emergency Use Authorization (EUA). This EUA will remain in effect (meaning this test can be used) for the duration of the COVID-19 declaration under Section 564(b)(1) of the Act, 21 U.S.C. section 360bbb-3(b)(1), unless the authorization is terminated or revoked.  Performed at [facility], [address] [place], [place] [address]   MRSA Next Gen by PCR, Nasal     Status: None   Collection Time: 06/24/22  4:49 PM   Specimen: Nasal Mucosa; Nasal Swab  Result  Value Ref Range Status   MRSA by PCR Next Gen NOT DETECTED NOT DETECTED Final    Comment: (NOTE) The GeneXpert MRSA Assay (FDA approved for NASAL specimens only), is one component of a comprehensive MRSA colonization surveillance program. It is not intended to diagnose MRSA infection nor to guide or monitor treatment for MRSA infections. Test performance is not FDA approved in patients less than 31 years old. Performed at [facility], [address] [place], [place] [address]     Coagulation Studies: No results for input(s): "LABPROT", "INR" in the last 72 hours.   Urinalysis: No results for input(s): "COLORURINE", "LABSPEC", "PHURINE", "GLUCOSEU", "HGBUR", "BILIRUBINUR", "KETONESUR", "PROTEINUR", "UROBILINOGEN", "NITRITE", "LEUKOCYTESUR" in the last 72 hours.  Invalid input(s): "APPERANCEUR"    Imaging: DG Chest 1 View  Result Date: 06/30/2022 CLINICAL DATA:  Short of breath.  Follow-up study.  Inpatient. EXAM: CHEST  1 VIEW COMPARISON:  06/25/2022 and older exams. FINDINGS: Stable enlargement of the cardiac silhouette. Low lung volumes. Mild opacity at both lung  bases consistent with atelectasis. Remainder of the lungs is clear. No pneumothorax. Right-sided PICC, tip projecting near the caval atrial junction, new since the prior study. IMPRESSION: 1. No acute findings.  No current evidence of pulmonary edema. 2. Mild lung base opacities consistent with atelectasis. No convincing pneumonia. 3. Stable cardiomegaly. Electronically Signed   By: Lajean Manes M.D.   On: 06/30/2022 11:03     Medications:    sodium chloride     sodium chloride Stopped (06/27/22 0300)   norepinephrine (LEVOPHED) Adult infusion Stopped (07/02/2022 0219)    aspirin EC  81 mg Oral Daily   atorvastatin  20 mg Oral q1800   atropine  0.5 mg Intravenous Once   Chlorhexidine Gluconate Cloth  6 each Topical Q0600   clopidogrel  75 mg Oral Daily   LORazepam  0.5 mg Intravenous Once   sevelamer carbonate  1,600 mg Oral TID WC   sodium chloride flush  10-40 mL Intracatheter Q12H   sodium chloride flush  3 mL Intravenous Q12H   sodium chloride, albuterol, guaiFENesin, hydrALAZINE, senna-docusate, sodium chloride flush, sodium chloride flush     Assessment/ Plan:  Kathleen Sharp is a 76 y.o.  female with past medical conditions including hyperlipidemia, hypertension, cardiomegaly, and end-stage renal disease on hemodialysis.  Patient presents to the emergency department with complaints of shortness of breath and has been admitted for Hyperkalemia [E87.5] SOB (shortness of breath) [R06.02] Tachyarrhythmia [R00.0] Volume overload [E87.70]  [facility]/M/F/left AVG/82 kg  Volume overload/hyperkalemia with end-stage renal disease on hemodialysis.  Potassium on ED arrival 5.9, increased to 6.2 after pharmacologic treatment. Patient hyperkalemia is now better            Chest x-ray showed moderate pulmonary edema with bilateral pleural effusions.    Patient did receive renal placement therapy on Saturday             We will dialyze patient  Today versus tomorrow   after coordinating with the cardiology team for her catheterization needs   2. Anemia of chronic kidney disease Lab Results  Component Value Date   HGB 8.1 (L) 07/15/2022    Hemoglobin at goal  3. Secondary Hyperparathyroidism: with outpatient labs: PTH 798, phosphorus 8.5, calcium 8.0 on 04/29/22.   Lab Results  Component Value Date   CALCIUM 8.5 (L) 06/20/2022   PHOS 5.2 (H) 07/14/2022     Continue sevelamer ordered with meals.  4.  Hypotensive  patient blood pressure is on the lower side Patient is not on pressors This is more secondary to her cardiac issues Cardiology is following closely  5.  NSTEMI with elevated troponin and ST depression with SVT on EKG.       Patient was earlier on IV heparin Patient is being closely followed by primary team as well as cardiology Patient is to have cardiac catheterization on  today      LOS: 7 Jalei Shibley s Derrian Rodak 10/16/20237:10 AM

## 2022-07-01 NOTE — Progress Notes (Signed)
Patient had a cardiac catheterization without complication.  Patient ready to decrease to 1 of Versed and 25 of fentanyl and became combative during cardiac cath and had to reverse with 0.25 Romazicon.  Patient much more alert now.  Cardiac catheterization revealed minor luminal irregularities about 30% in LAD circumflex and RCA with no significant coronary artery disease.  LVEF on echo was 55% thus LV gram was deferred.  Will discontinue aspirin and continue Plavix and add Eliquis because patient had episode of atrial fibrillation.  Patient is good to go home with follow-up in the office this Friday at 9 AM.

## 2022-07-01 NOTE — Progress Notes (Signed)
PT tolerated 3 hrs of tx with no signs of distress  UF = 0  Report given to floor RN    06/16/2022 1930  Vitals  Temp (!) 97.5 F (36.4 C)  Temp Source Oral  BP 94/76  MAP (mmHg) 82  BP Location Right Wrist  BP Method Automatic  Patient Position (if appropriate) Lying  Pulse Rate 70  Pulse Rate Source Monitor  ECG Heart Rate 70  Resp (!) 22

## 2022-07-01 NOTE — Progress Notes (Signed)
SUBJECTIVE:  Patient is a 76 year old female with a history of ESRD on dialysis MWF, hypertension, cardiomegaly, hyperlipidemia, who presented to the ED on 07/13/2022 with shortness of breath, palpitations. Denied chest pain at admission.     On 10/10, patient had an episode of nonsustained ventricular tachycardia followed by atrial fibrillation associated with dizziness. Patient denies losing consciousness. IV amiodoarone started.   Cardiac cath rescheduled for 10/12, however, patient developed mental status changes, cath was cancelled.   On 10/15 overnight patient became unresponsive, bradycardic, hypotensive. IV amiodarone discontinued. Patient transferred to ICU.     Vitals:   07/07/2022 0500 06/17/2022 0600 06/21/2022 0700 06/16/2022 0830  BP:   (!) 105/93 95/65  Pulse:  64 67 64  Resp: 18 17 (!) 21 (!) 28  Temp:    97.6 F (36.4 C)  TempSrc:    Oral  SpO2:  100% 93% 98%  Weight:      Height:        Intake/Output Summary (Last 24 hours) at 06/16/2022 0841 Last data filed at 07/06/2022 0400 Gross per 24 hour  Intake 745 ml  Output --  Net 745 ml    LABS: Basic Metabolic Panel: Recent Labs    06/30/22 0555 06/17/2022 0403  NA 132* 132*  K 4.0 4.3  CL 94* 94*  CO2 27 28  GLUCOSE 110* 100*  BUN 30* 41*  CREATININE 5.87* 7.37*  CALCIUM 8.5* 8.5*  MG 1.6* 2.5*  PHOS  --  5.2*   Liver Function Tests: Recent Labs    06/30/22 0555  AST 29  ALT 124*  ALKPHOS 52  BILITOT 0.4  PROT 6.4*  ALBUMIN 3.7   No results for input(s): "LIPASE", "AMYLASE" in the last 72 hours. CBC: Recent Labs    06/30/22 0555 07/06/2022 0403  WBC 9.9 10.7*  NEUTROABS  --  7.9*  HGB 8.6* 8.1*  HCT 27.9* 25.8*  MCV 80.6 81.6  PLT 176 162   Cardiac Enzymes: No results for input(s): "CKTOTAL", "CKMB", "CKMBINDEX", "TROPONINI" in the last 72 hours. BNP: Invalid input(s): "POCBNP" D-Dimer: No results for input(s): "DDIMER" in the last 72 hours. Hemoglobin A1C: No results for input(s):  "HGBA1C" in the last 72 hours. Fasting Lipid Panel: No results for input(s): "CHOL", "HDL", "LDLCALC", "TRIG", "CHOLHDL", "LDLDIRECT" in the last 72 hours. Thyroid Function Tests: No results for input(s): "TSH", "T4TOTAL", "T3FREE", "THYROIDAB" in the last 72 hours.  Invalid input(s): "FREET3" Anemia Panel: No results for input(s): "VITAMINB12", "FOLATE", "FERRITIN", "TIBC", "IRON", "RETICCTPCT" in the last 72 hours.   PHYSICAL EXAM General: Well developed, well nourished, in no acute distress HEENT:  Normocephalic and atramatic Neck:  No JVD.  Lungs: Clear bilaterally to auscultation and percussion. Heart: HRRR . Normal S1 and S2 without gallops or murmurs.  Abdomen: Bowel sounds are positive, abdomen soft and non-tender  Msk:  Back normal, normal gait. Normal strength and tone for age. Extremities: No clubbing, cyanosis or edema.   Neuro: Alert and oriented X 3. Psych:  Good affect, responds appropriately  TELEMETRY: sinus rhythm  ASSESSMENT AND PLAN: Patient mental status improved this morning. Patient is much more alert and oriented. Patient scheduled for cardiac cath this morning for NSTEMI. Will plan to proceed.  Principal Problem:   NSTEMI (non-ST elevated myocardial infarction) ([place]) Active Problems:   Anemia of chronic renal failure, stage 5 (HCC)   End stage renal disease (HCC)   Essential hypertension   Volume overload   Hyperkalemia   Pulmonary edema  Cardiac arrhythmia   Syncope and collapse   Metabolic encephalopathy    Diarra Ceja, FNP-C 06/16/2022 8:41 AM

## 2022-07-01 NOTE — Consult Note (Signed)
ANTICOAGULATION CONSULT NOTE  Pharmacy Consult for Apixaban Indication: atrial fibrillation  Patient Measurements: Height: 5\' 2"  (157.5 cm) Weight: 85.5 kg (188 lb 7.9 oz) IBW/kg (Calculated) : 50.1  Labs: Recent Labs    06/29/22 0428 06/29/22 1418 06/30/22 0457 06/30/22 0555 06/17/2022 0403  HGB 9.5*  --   --  8.6* 8.1*  HCT 29.4*  --   --  27.9* 25.8*  PLT 202  --   --  176 162  CREATININE 7.35*  --   --  5.87* 7.37*  TROPONINIHS 4,088* 2,872* 2,260*  --   --     Estimated Creatinine Clearance: 6.8 mL/min (A) (by C-G formula based on SCr of 7.37 mg/dL (H)).   Medical History: Past Medical History:  Diagnosis Date   Anemia    Arthritis    Chronic kidney disease    Chronic Kidney Disease   Hypercholesterolemia    Hypertension     Medications:  No anticoagulation prior to admission per my chart review Patient is on SAPT with Plavix. ASA discontinued by cardiology  Assessment: Patient is a 76 y/o F with medical history as above and including ESRD on HD MWF who was admitted 10/8 for NSTEMI, cardiac arrhythmias, syncope and collapse, pulmonary edema, encephalopathy. Pharmacy consulted to initiate apixaban for Afib.  Plan:  --Apixaban 5 mg BID --CBC at least every 3 days per protocol  Benita Gutter 06/30/2022,1:38 PM

## 2022-07-01 NOTE — Progress Notes (Signed)
Patient transferred to special procedures by bed. Alert with no distress noted. Report given to Marcie Bal, [place].

## 2022-07-02 DIAGNOSIS — Z515 Encounter for palliative care: Secondary | ICD-10-CM | POA: Diagnosis not present

## 2022-07-02 DIAGNOSIS — N186 End stage renal disease: Secondary | ICD-10-CM | POA: Diagnosis not present

## 2022-07-02 DIAGNOSIS — D62 Acute posthemorrhagic anemia: Secondary | ICD-10-CM | POA: Diagnosis not present

## 2022-07-02 DIAGNOSIS — Z66 Do not resuscitate: Secondary | ICD-10-CM

## 2022-07-02 DIAGNOSIS — I214 Non-ST elevation (NSTEMI) myocardial infarction: Secondary | ICD-10-CM | POA: Diagnosis not present

## 2022-07-02 LAB — BASIC METABOLIC PANEL
Anion gap: 8 (ref 5–15)
BUN: 33 mg/dL — ABNORMAL HIGH (ref 8–23)
CO2: 29 mmol/L (ref 22–32)
Calcium: 8.3 mg/dL — ABNORMAL LOW (ref 8.9–10.3)
Chloride: 98 mmol/L (ref 98–111)
Creatinine, Ser: 4.69 mg/dL — ABNORMAL HIGH (ref 0.44–1.00)
GFR, Estimated: 9 mL/min — ABNORMAL LOW (ref >60)
Glucose, Bld: 100 mg/dL — ABNORMAL HIGH (ref 70–99)
Potassium: 3.6 mmol/L (ref 3.5–5.1)
Sodium: 135 mmol/L (ref 135–145)

## 2022-07-02 LAB — CBC WITH DIFFERENTIAL/PLATELET
Abs Immature Granulocytes: 0.22 10*3/uL — ABNORMAL HIGH (ref 0.00–0.07)
Basophils Absolute: 0 10*3/uL (ref 0.0–0.1)
Basophils Relative: 0 %
Eosinophils Absolute: 0.1 10*3/uL (ref 0.0–0.5)
Eosinophils Relative: 1 %
HCT: 23.9 % — ABNORMAL LOW (ref 36.0–46.0)
Hemoglobin: 7.3 g/dL — ABNORMAL LOW (ref 12.0–15.0)
Immature Granulocytes: 2 %
Lymphocytes Relative: 10 %
Lymphs Abs: 1.1 10*3/uL (ref 0.7–4.0)
MCH: 25.4 pg — ABNORMAL LOW (ref 26.0–34.0)
MCHC: 30.5 g/dL (ref 30.0–36.0)
MCV: 83.3 fL (ref 80.0–100.0)
Monocytes Absolute: 1.6 10*3/uL — ABNORMAL HIGH (ref 0.1–1.0)
Monocytes Relative: 14 %
Neutro Abs: 8 10*3/uL — ABNORMAL HIGH (ref 1.7–7.7)
Neutrophils Relative %: 73 %
Platelets: 147 10*3/uL — ABNORMAL LOW (ref 150–400)
RBC: 2.87 MIL/uL — ABNORMAL LOW (ref 3.87–5.11)
RDW: 16.7 % — ABNORMAL HIGH (ref 11.5–15.5)
WBC: 11 10*3/uL — ABNORMAL HIGH (ref 4.0–10.5)
nRBC: 0.2 % (ref 0.0–0.2)

## 2022-07-02 LAB — GLUCOSE, CAPILLARY
Glucose-Capillary: 97 mg/dL (ref 70–99)
Glucose-Capillary: 89 mg/dL (ref 70–99)
Glucose-Capillary: 112 mg/dL — ABNORMAL HIGH (ref 70–99)
Glucose-Capillary: 106 mg/dL — ABNORMAL HIGH (ref 70–99)
Glucose-Capillary: 94 mg/dL (ref 70–99)

## 2022-07-02 LAB — HEMOGLOBIN AND HEMATOCRIT, BLOOD
HCT: 24.6 % — ABNORMAL LOW (ref 36.0–46.0)
Hemoglobin: 7.7 g/dL — ABNORMAL LOW (ref 12.0–15.0)

## 2022-07-02 LAB — MAGNESIUM: Magnesium: 1.9 mg/dL (ref 1.7–2.4)

## 2022-07-02 LAB — PREPARE RBC (CROSSMATCH)

## 2022-07-02 LAB — PHOSPHORUS: Phosphorus: 3.5 mg/dL (ref 2.5–4.6)

## 2022-07-02 MED ORDER — SODIUM CHLORIDE 0.9% IV SOLUTION: Freq: Once | INTRAVENOUS | Status: DC

## 2022-07-02 NOTE — Progress Notes (Signed)
[facility]  ROUNDING NOTE   Subjective:   Kathleen Sharp is a 76 year old female with past medical conditions including hyperlipidemia, hypertension, cardiomegaly, and end-stage renal disease on hemodialysis.  Patient presents to the emergency department with complaints of shortness of breath and has been admitted for Hyperkalemia [E87.5] SOB (shortness of breath) [R06.02] Tachyarrhythmia [R00.0] Volume overload [E87.70]  Patient is known to our practice and receives outpatient dialysis treatments at [facility] on a MWF schedule, supervised by Dr. Candiss Norse.    Patient was seen in the ICU.  Patient offers no new physical complaint. Patient Informed me that she was feeling better than before    Objective:  Vital signs in last 24 hours:  Temp:  [97.5 F (36.4 C)-98.2 F (36.8 C)] 98.2 F (36.8 C) (10/17 0400) Pulse Rate:  [0-98] 64 (10/17 0600) Resp:  [14-30] 19 (10/17 0600) BP: (65-142)/(33-119) 86/56 (10/17 0600) SpO2:  [76 %-100 %] 100 % (10/17 0600) Arterial Line BP: (93-122)/(48-103) 95/48 (10/17 0600)  Weight change:  Filed Weights   06/26/22 0604 06/26/22 1342 06/29/22 0415  Weight: 81.6 kg 83.6 kg 85.5 kg    Intake/Output: I/O last 3 completed shifts: In: 702.6 [I.V.:702.6] Out: 0    Intake/Output this shift:  No intake/output data recorded.  Physical Exam: General: NAD  Head: Normocephalic, atraumatic. Moist oral mucosal membranes  Eyes: Anicteric  Lungs:  Diminished in bases, normal effort, room air  Heart: Regular rate and rhythm  Abdomen:  Soft, nontender, obese  Extremities: Trace peripheral edema.  Neurologic: Nonfocal, moving all four extremities  Skin: No lesions  Access: Left AVG    Basic Metabolic Panel: Recent Labs  Lab 06/28/22 0635 06/29/22 0428 06/30/22 0555 07/07/2022 0403 07/02/22 0445  NA 131* 130* 132* 132* 135  K 3.8 3.9 4.0 4.3 3.6  CL 90* 92* 94* 94* 98  CO2 $Re'27 26 27 28 29  'gpp$ GLUCOSE 165* 136* 110*  100* 100*  BUN 39* 49* 30* 41* 33*  CREATININE 6.92* 7.35* 5.87* 7.37* 4.69*  CALCIUM 8.4* 8.6* 8.5* 8.5* 8.3*  MG  --   --  1.6* 2.5* 1.9  PHOS  --   --   --  5.2* 3.5    Liver Function Tests: Recent Labs  Lab 06/27/22 1045 06/28/22 0635 06/30/22 0555  AST 169* 85* 29  ALT 342* 244* 124*  ALKPHOS 69 61 52  BILITOT 0.8 0.4 0.4  PROT 6.6 6.0* 6.4*  ALBUMIN 3.5 3.2* 3.7   No results for input(s): "LIPASE", "AMYLASE" in the last 168 hours. No results for input(s): "AMMONIA" in the last 168 hours.  CBC: Recent Labs  Lab 06/27/22 1045 06/29/22 0428 06/30/22 0555 06/30/2022 0403 07/02/22 0445  WBC 15.4* 13.6* 9.9 10.7* 11.0*  NEUTROABS  --   --   --  7.9* 8.0*  HGB 10.7* 9.5* 8.6* 8.1* 7.3*  HCT 33.8* 29.4* 27.9* 25.8* 23.9*  MCV 78.8* 78.6* 80.6 81.6 83.3  PLT 230 202 176 162 147*    Cardiac Enzymes: No results for input(s): "CKTOTAL", "CKMB", "CKMBINDEX", "TROPONINI" in the last 168 hours.  BNP: Invalid input(s): "POCBNP"  CBG: Recent Labs  Lab 07/03/2022 1153 06/16/2022 2009 06/16/2022 2352 07/02/22 0354 07/02/22 0718  GLUCAP 84 97 85 97 89    Microbiology: Results for orders placed or performed during the hospital encounter of 06/24/2022  Resp Panel by RT-PCR (Flu A&B, Covid) Anterior Nasal Swab     Status: None   Collection Time: 06/20/2022  5:13 PM  Specimen: Anterior Nasal Swab  Result Value Ref Range Status   SARS Coronavirus 2 by RT PCR NEGATIVE NEGATIVE Final    Comment: (NOTE) SARS-CoV-2 target nucleic acids are NOT DETECTED.  The SARS-CoV-2 RNA is generally detectable in upper respiratory specimens during the acute phase of infection. The lowest concentration of SARS-CoV-2 viral copies this assay can detect is 138 copies/mL. A negative result does not preclude SARS-Cov-2 infection and should not be used as the sole basis for treatment or other patient management decisions. A negative result may occur with  improper specimen collection/handling,  submission of specimen other than nasopharyngeal swab, presence of viral mutation(s) within the areas targeted by this assay, and inadequate number of viral copies(<138 copies/mL). A negative result must be combined with clinical observations, patient history, and epidemiological information. The expected result is Negative.  Fact Sheet for Patients:  EntrepreneurPulse.com.au  Fact Sheet for Healthcare Providers:  IncredibleEmployment.be  This test is no t yet approved or cleared by the [place] FDA and  has been authorized for detection and/or diagnosis of SARS-CoV-2 by FDA under an Emergency Use Authorization (EUA). This EUA will remain  in effect (meaning this test can be used) for the duration of the COVID-19 declaration under Section 564(b)(1) of the Act, 21 U.S.C.section 360bbb-3(b)(1), unless the authorization is terminated  or revoked sooner.       Influenza A by PCR NEGATIVE NEGATIVE Final   Influenza B by PCR NEGATIVE NEGATIVE Final    Comment: (NOTE) The Xpert Xpress SARS-CoV-2/FLU/RSV plus assay is intended as an aid in the diagnosis of influenza from Nasopharyngeal swab specimens and should not be used as a sole basis for treatment. Nasal washings and aspirates are unacceptable for Xpert Xpress SARS-CoV-2/FLU/RSV testing.  Fact Sheet for Patients: EntrepreneurPulse.com.au  Fact Sheet for Healthcare Providers: IncredibleEmployment.be  This test is not yet approved or cleared by the [place] FDA and has been authorized for detection and/or diagnosis of SARS-CoV-2 by FDA under an Emergency Use Authorization (EUA). This EUA will remain in effect (meaning this test can be used) for the duration of the COVID-19 declaration under Section 564(b)(1) of the Act, 21 U.S.C. section 360bbb-3(b)(1), unless the authorization is terminated or revoked.  Performed at [facility], [address] [place], [place] [address]   MRSA Next Gen by PCR, Nasal     Status: None   Collection Time: 06/24/22  4:49 PM   Specimen: Nasal Mucosa; Nasal Swab  Result Value Ref Range Status   MRSA by PCR Next Gen NOT DETECTED NOT DETECTED Final    Comment: (NOTE) The GeneXpert MRSA Assay (FDA approved for NASAL specimens only), is one component of a comprehensive MRSA colonization surveillance program. It is not intended to diagnose MRSA infection nor to guide or monitor treatment for MRSA infections. Test performance is not FDA approved in patients less than 17 years old. Performed at [facility], [address] [place], [place] [address]     Coagulation Studies: No results for input(s): "LABPROT", "INR" in the last 72 hours.   Urinalysis: No results for input(s): "COLORURINE", "LABSPEC", "PHURINE", "GLUCOSEU", "HGBUR", "BILIRUBINUR", "KETONESUR", "PROTEINUR", "UROBILINOGEN", "NITRITE", "LEUKOCYTESUR" in the last 72 hours.  Invalid input(s): "APPERANCEUR"    Imaging: CARDIAC CATHETERIZATION  Result Date: 06/30/2022   Prox RCA lesion is 30% stenosed.   Dist Cx lesion is 30% stenosed.   Dist LAD lesion is 30% stenosed.   The left ventricular systolic function is normal.   The left ventricular  ejection fraction is 50-55% by visual estimate.   DG Chest 1 View  Result Date: 06/30/2022 CLINICAL DATA:  Short of breath.  Follow-up study.  Inpatient. EXAM: CHEST  1 VIEW COMPARISON:  06/25/2022 and older exams. FINDINGS: Stable enlargement of the cardiac silhouette. Low lung volumes. Mild opacity at both lung bases consistent with atelectasis. Remainder of the lungs is clear. No pneumothorax. Right-sided PICC, tip projecting near the caval atrial junction, new since the prior study. IMPRESSION: 1. No acute findings.  No current evidence of pulmonary edema. 2. Mild lung base opacities consistent with atelectasis. No convincing pneumonia. 3. Stable cardiomegaly. Electronically  Signed   By: Lajean Manes M.D.   On: 06/30/2022 11:03     Medications:    sodium chloride     norepinephrine (LEVOPHED) Adult infusion Stopped (06/22/2022 0219)    apixaban  5 mg Oral BID   atorvastatin  20 mg Oral q1800   atropine  0.5 mg Intravenous Once   Chlorhexidine Gluconate Cloth  6 each Topical Q0600   clopidogrel  75 mg Oral Daily   sevelamer carbonate  1,600 mg Oral TID WC   sodium chloride flush  10-40 mL Intracatheter Q12H   sodium chloride flush  3 mL Intravenous Q12H   sodium chloride flush  3 mL Intravenous Q12H   sodium chloride flush  3 mL Intravenous Q12H   sodium chloride, acetaminophen, albuterol, guaiFENesin, hydrALAZINE, ondansetron (ZOFRAN) IV, senna-docusate, sodium chloride flush, sodium chloride flush     Assessment/ Plan:  Kathleen Sharp is a 76 y.o.  female with past medical conditions including hyperlipidemia, hypertension, cardiomegaly, and end-stage renal disease on hemodialysis.  Patient presents to the emergency department with complaints of shortness of breath and has been admitted for Hyperkalemia [E87.5] SOB (shortness of breath) [R06.02] Tachyarrhythmia [R00.0] Volume overload [E87.70]  [facility] /M/F/left AVG/82 kg  Volume overload/hyperkalemia with end-stage renal disease on hemodialysis.  Potassium on ED arrival 5.9, increased to 6.2 after pharmacologic treatment. Patient hyperkalemia is now better            Chest x-ray showed moderate pulmonary edema with bilateral pleural effusions.    Patient was last dialyzed yesterday, No need for renal placement therapy today   2. Anemia of chronic kidney disease Lab Results  Component Value Date   HGB 7.3 (L) 07/02/2022    Hemoglobin is not at goal We will follow with primary team/ICU team regarding need for PRBC GI team is on the case to help with the find etiology of drop in her hemoglobin  3. Secondary Hyperparathyroidism: with outpatient labs: PTH 798, phosphorus  8.5, calcium 8.0 on 04/29/22.   Lab Results  Component Value Date   CALCIUM 8.3 (L) 07/02/2022   PHOS 3.5 07/02/2022     Continue sevelamer ordered with meals.  4.  Hypotensive  patient blood pressure is on the lower side Cardiology is following closely  5.  NSTEMI with elevated troponin and ST depression with SVT on EKG.       Patient was earlier on IV heparin Patient is being closely followed by primary team as well as cardiology Patient had  cardiac catheterization - its showed minor luminal irregularities about 30% in LAD circumflex and RCA with no significant coronary artery disease     LOS: 8 Kathleen Sharp 10/17/20237:52 AM

## 2022-07-02 NOTE — Progress Notes (Signed)
Palliative Care Progress Note, Assessment & Plan   Patient Name: Kathleen Sharp       Date: 07/02/2022 DOB: [birth year]  Age: 76 y.o. MRN#: [id number] Attending Physician: Carlyle Lipa, MD Primary Care Physician: Adalberto Ill, MD Admit Date: 07/05/2022  Reason for Consultation/Follow-up: Establishing goals of care  Subjective: Patient is sitting in bed in no apparent distress.  She acknowledges my presence and is able to make her wishes known.  No family or friends at bedside presently.  HPI: 76 y.o. female  with past medical history of ESRD on HD, HTN/HLD, cardiomegaly, arthritis, AV fistula placement 2017, former smoker admitted on 06/17/2022 with NSTEMI.   Summary of counseling/coordination of care: After reviewing the patient's chart and assessing the patient at bedside, I spoke with patient in regards to goals of care and plan of care.    I attempted to elicit goals and values important to the patient.  She shares her goal is to get home.  [place] relatives around to help check on her.  Her aim is to get strong enough to allow her to be able to return back home.  When asked her current understanding of her medical situation she shares that she her heart catheterization well. I attempted to discuss risks of possible future cardiac events with ESRD and HD, but patient shares "there is nothing wrong with my heart".   Patient says she wants to go home and live independently again. We discussed that she will need to have PT/OT evaluations to determine her functional ability once medically appropriate. She shares she wants to continue with dialysis and get back to her life at home as soon as possible.  Therapeutic silence and active listening provided for  patient to share her thoughts and emotions regarding current medical situation.  Emotional support provided.    Of note, during our visit patient continually asked to help her speak with a friend on the phone. After multiple attempts to address [place] I assisted her with dialing appropriate number.  PMT will continue to follow patient throughout her hospitalization and continue discussions of [place].    Code Status: DNR  Prognosis: Unable to determine  Discharge Planning: To Be Determined  Physical Exam Constitutional:      General: She is not in acute distress.    Appearance: She is not ill-appearing.  HENT:     Head: Normocephalic.  Cardiovascular:     Rate and Rhythm: Normal rate.  Pulmonary:     Effort: Pulmonary effort is normal.  Chest:     Chest wall: No tenderness.  Abdominal:     Palpations: Abdomen is soft.  Musculoskeletal:        General: Normal range of motion.     Comments: MAETC  Neurological:     Mental Status: She is alert and oriented to person, place, and time.  Psychiatric:        Mood and Affect: Mood is not anxious.        Behavior: Behavior is not agitated.             Palliative Assessment/Data: 50%    Total Time  25 minutes  Greater than 50%  of this time was spent counseling and coordinating care related to the above assessment and plan.  Thank you for allowing the Palliative Medicine Team to assist in the care of this patient.  [place] Ilsa Iha, FNP-BC Palliative Medicine Team Team Phone # [phone]

## 2022-07-02 NOTE — Consult Note (Signed)
Wyline Mood , MD [address], [address], [place], [place], [address] [address], [place], [place], [address] Phone: [phone]  Fax: [phone]  Consultation  Referring Provider:    Dr Quincy Sheehan Primary Care Physician:  Johnney Killian, MD Primary Gastroenterologist:  None          Reason for Consultation:     Anemia   Date of Admission:  06/21/2022 Date of Consultation:  07/02/2022         HPI:   NAQUISHA WHITEHAIR is a 76 y.o. female was admitted to the hospital on 06/19/2022 with short of breath, volume overload, palpitations, pulmonary edema, hyperkalemia.  BNP was over 4500.  End-stage renal disease on dialysis.  On 06/25/2022 at 2 syncopal episodes and nonsustained V. tach followed by atrial fibrillation.  Been on aspirin and Plavix since 06/28/2022.  Had restarted on Levophed on 06/30/2022 for unresponsiveness bradycardia and hypotension.  Troponin was significantly elevated but has been coming down.  Borderline hypotension.  Being treated for an NSTEMI.  Underwent cardiac catheterization on Plavix and Eliquis.  Being treated for pulmonary edema due to SVT and NSTEMI on 3 L of oxygen presently.   I have been consulted for evaluation for a drop in hemoglobin   5 days prior hemoglobin was around 10.5 g drop to 8.1 g and presently 7.3 g platelet 147.  No elevation in BUN/creatinine ratio creatinine has been trending down.  I discussed with Dr. Quincy Sheehan no hematemesis or melena. The patient denies any overt bleeding with no epistaxis or hematemesis.  I discussed with the in charge nurse no rectal bleeding or melena or hematochezia noted.  I went to see the patient around 3 PM his blood pressure was in the 80s systolic Past Medical History:  Diagnosis Date   Anemia    Arthritis    Chronic kidney disease    Chronic Kidney Disease   Hypercholesterolemia    Hypertension     Past Surgical History:  Procedure Laterality Date   ABDOMINAL HYSTERECTOMY     AV FISTULA  PLACEMENT Left 12/15/2015   Procedure: INSERTION OF ARTERIOVENOUS (AV) GORE-TEX GRAFT ARM             ( BRACHIAL AXILLARY GRAFT );  Surgeon: Renford Dills, MD;  Location: [facility];  Service: Vascular;  Laterality: Left;   CAPD INSERTION N/A 11/24/2015   Procedure: Exploratory laparoscopy;  Surgeon: Renford Dills, MD;  Location: [facility];  Service: Vascular;  Laterality: N/A;, attempted insertion but unable due to scar tissue   LEFT HEART CATH AND CORONARY ANGIOGRAPHY N/A 07/14/2022   Procedure: LEFT HEART CATH AND CORONARY ANGIOGRAPHY;  Surgeon: Laurier Nancy, MD;  Location: [facility];  Service: Cardiovascular;  Laterality: N/A;   PERIPHERAL VASCULAR CATHETERIZATION Left 02/27/2016   Procedure: Thrombectomy;  Surgeon: Renford Dills, MD;  Location: [facility];  Service: Cardiovascular;  Laterality: Left;   PERIPHERAL VASCULAR CATHETERIZATION Left 04/12/2016   Procedure: Thrombectomy;  Surgeon: Renford Dills, MD;  Location: [facility];  Service: Cardiovascular;  Laterality: Left;   PERIPHERAL VASCULAR CATHETERIZATION Left 05/22/2016   Procedure: Thrombectomy;  Surgeon: Renford Dills, MD;  Location: [facility];  Service: Cardiovascular;  Laterality: Left;   PERIPHERAL VASCULAR CATHETERIZATION N/A 05/22/2016   Procedure: A/V Shuntogram/Fistulagram;  Surgeon: Renford Dills, MD;  Location: [facility];  Service: Cardiovascular;  Laterality: N/A;   PERIPHERAL VASCULAR CATHETERIZATION Left 06/21/2016  Procedure: Thrombectomy;  Surgeon: [name], MD;  Location: [facility];  Service: Cardiovascular;  Laterality: Left;    Prior to Admission medications   Medication Sig Start Date End Date Taking? Authorizing Provider  carvedilol (COREG) 6.25 MG tablet Take 6.25 mg by mouth 2 (two) times daily with a meal.   Yes [provider]  clopidogrel (PLAVIX) 75 MG tablet Take 75 mg by mouth daily.   Yes [provider]  lidocaine-prilocaine (EMLA) cream Apply 1 Application topically daily as needed (dialysis treatment).   Yes [provider]  lisinopril (ZESTRIL) 40 MG tablet Take 40 mg by mouth daily.   Yes [provider]  loratadine (CLARITIN) 10 MG tablet Take 10 mg by mouth daily.   Yes [provider]  Multiple Vitamins-Minerals (MULTIVITAMIN WITH MINERALS) tablet Take 1 tablet by mouth daily.   Yes [provider]  sevelamer carbonate (RENVELA) 800 MG tablet Take 1,600 mg by mouth 3 (three) times daily with meals.   Yes [provider]    Family History  Problem Relation Age of Onset   Kidney disease Father    Heart disease Mother    Kidney disease Brother      Social History   Tobacco Use   Smoking status: Former    Packs/day: 1.00    Years: 15.00    Total pack years: 15.00    Types: Cigarettes    Quit date: 09/16/2012    Years since quitting: 9.7   Smokeless tobacco: Never  Substance Use Topics   Alcohol use: No    Alcohol/week: 0.0 standard drinks of alcohol   Drug use: No    Allergies as of 06/24/2022   (No Known Allergies)    Review of Systems:    All systems reviewed and negative except where noted in HPI.   Physical Exam:  Vital signs in last 24 hours: Temp:  [97.5 F (36.4 C)-98.2 F (36.8 C)] 98 F (36.7 C) (10/17 1130) Pulse Rate:  [52-98] 77 (10/17 1115) Resp:  [15-30] 26 (10/17 1145) BP: (65-142)/(33-119) 86/56 (10/17 0600) SpO2:  [92 %-100 %] 99 % (10/17 1145) Arterial Line BP: (95-122)/(47-103) 110/98 (10/17 1145) Last BM Date : 06/27/22 General:   Pleasant, cooperative in NAD Head:  Normocephalic and atraumatic. Eyes:   No icterus.   Conjunctiva pink. PERRLA. Ears:  Normal auditory acuity. Neck:  Supple; no masses or thyroidomegaly Lungs: Respirations even and unlabored. Lungs clear to auscultation bilaterally.   No wheezes, crackles, or rhonchi.  Heart:  Regular rate and rhythm; systolic murmur  in the aortic area clicks, rubs or gallops Abdomen:  Soft, nondistended, nontender. Normal bowel sounds. No appreciable masses or hepatomegaly.  No rebound or guarding.  Neurologic:  Alert and oriented x3;  grossly normal neurologically. Psych:  Alert and cooperative. Normal affect.  LAB RESULTS: Recent Labs    06/30/22 0555 07/09/2022 0403 07/02/22 0445  WBC 9.9 10.7* 11.0*  HGB 8.6* 8.1* 7.3*  HCT 27.9* 25.8* 23.9*  PLT 176 162 147*   BMET Recent Labs    06/30/22 0555 07/01/22 0403 07/02/22 0445  NA 132* 132* 135  K 4.0 4.3 3.6  CL 94* 94* 98  CO2 $Re'27 28 29  'Fpa$ GLUCOSE 110* 100* 100*  BUN 30* 41* 33*  CREATININE 5.87* 7.37* 4.69*  CALCIUM 8.5* 8.5* 8.3*   LFT Recent Labs    06/30/22 0555  PROT 6.4*  ALBUMIN 3.7  AST 29  ALT 124*  ALKPHOS 52  BILITOT 0.4   PT/INR No results for input(s): "LABPROT", "INR" in the last 72 hours.  STUDIES: CARDIAC CATHETERIZATION  Result Date: 07/12/2022   Prox RCA lesion is 30% stenosed.   Dist Cx lesion is 30% stenosed.   Dist LAD lesion is 30% stenosed.   The left ventricular systolic function is normal.   The left ventricular ejection fraction is 50-55% by visual estimate.      Impression / Plan:   EMEREE MAHLER is a 76 y.o. y/o female with altered comorbidities admitted with shortness of breath, pulmonary edema, hyperkalemia, NSTEMI, arrhythmias multiple episodes of syncope unresponsiveness low blood pressure patient .  I have been consulted for anemia.  Gradual drop in hemoglobin with no overt blood loss manifested either by hematemesis or melena.  The patient is on Eliquis and Plavix.  No elevation in BUN/creatinine ratio.  Plan 1.  From the GI point of view unless the patient has had no hematemesis or melena or hematochezia.  In addition has no elevation in BUN/creatinine ratio makes an upper GI bleed less likely.  At this point of time with the patient having a borderline blood pressure [systolic blood pressure in the 32I  systolic] multiple episodes of an arrhythmia, pulmonary edema, medical instability the risks of endoscopy procedure with anesthesia outweighs any benefits as we do not have any clear evidence of a GI bleed.  Would recommend other forms of work-up to rule out blood loss either at the cardiac catheterization site versus intra-abdominal was a sampling error.  With blood draws particularly multiple.  Watch the color of the stool.  If there is a concern for gastrointestinal bleed manifested either by an episode of hematemesis melena or hematochezia and obtain either CT angiogram or a tagged RBC scan.  There is no role for stool occult blood testing as a positive test does not indicate an active bleed nor  a negative test rule out an active bleed.  It is a test only indicated for colon cancer screening as an outpatient  2.  IV PPI    Thank you for involving me in the care of this patient.      LOS: 8 days   Jonathon Bellows, MD  07/02/2022, 12:37 PM

## 2022-07-02 NOTE — Progress Notes (Addendum)
1210 Arterial line intermittently working; difficult to flush and pull back blood. Blood pressure unable to be taken on the arms due to PICC line in the right upper arm and a fistula in the left upper arm. Cuff pressures taken on the legs reading low and not correlating with arterial pressures. Cristela Felt, MD notified. Per [place], continue to use leg cuff pressures.   1415 Arterial line removed per Cristela Felt, MD.  6389: Pt acutely confused. Attempts to reorient successful, however pt reverts to confused state within minutes. When alone, pt calls for help stating that she does not understand "what I am supposed to be doing." Niece also questioning about her confused state which worsens in the evenings. Cristela Felt, MD notified.

## 2022-07-02 NOTE — Progress Notes (Signed)
SUBJECTIVE: Patient is a 76 year old female with a history of ESRD on dialysis MWF, hypertension, cardiomegaly, hyperlipidemia, who presented to the ED on 06/25/2022 with shortness of breath, palpitations. Denied chest pain at admission.     On 10/10, patient had an episode of nonsustained ventricular tachycardia followed by atrial fibrillation associated with dizziness. Patient denies losing consciousness. IV amiodoarone started.    Cardiac cath rescheduled for 10/12, however, patient developed mental status changes, cath was cancelled.    On 10/15 overnight patient became unresponsive, bradycardic, hypotensive. IV amiodarone discontinued. Patient transferred to ICU.   Cardiac cath 06/23/2022 revealed minor luminal irregularities about 30% in LAD circumflex and RCA with no significant coronary artery disease.  LVEF on echo was 55% thus LV gram was deferred.     Vitals:   07/02/22 0300 07/02/22 0400 07/02/22 0505 07/02/22 0600  BP:  96/71  (!) 86/56  Pulse:  82 71 64  Resp: 20 (!) 23 (!) 21 19  Temp:  98.2 F (36.8 C)    TempSrc:      SpO2:  95% 100% 100%  Weight:      Height:        Intake/Output Summary (Last 24 hours) at 07/02/2022 0854 Last data filed at 07/10/2022 1930 Gross per 24 hour  Intake 565.88 ml  Output 0 ml  Net 565.88 ml    LABS: Basic Metabolic Panel: Recent Labs    06/27/2022 0403 07/02/22 0445  NA 132* 135  K 4.3 3.6  CL 94* 98  CO2 28 29  GLUCOSE 100* 100*  BUN 41* 33*  CREATININE 7.37* 4.69*  CALCIUM 8.5* 8.3*  MG 2.5* 1.9  PHOS 5.2* 3.5   Liver Function Tests: Recent Labs    06/30/22 0555  AST 29  ALT 124*  ALKPHOS 52  BILITOT 0.4  PROT 6.4*  ALBUMIN 3.7   No results for input(s): "LIPASE", "AMYLASE" in the last 72 hours. CBC: Recent Labs    07/14/2022 0403 07/02/22 0445  WBC 10.7* 11.0*  NEUTROABS 7.9* 8.0*  HGB 8.1* 7.3*  HCT 25.8* 23.9*  MCV 81.6 83.3  PLT 162 147*   Cardiac Enzymes: No results for input(s): "CKTOTAL", "CKMB",  "CKMBINDEX", "TROPONINI" in the last 72 hours. BNP: Invalid input(s): "POCBNP" D-Dimer: No results for input(s): "DDIMER" in the last 72 hours. Hemoglobin A1C: No results for input(s): "HGBA1C" in the last 72 hours. Fasting Lipid Panel: No results for input(s): "CHOL", "HDL", "LDLCALC", "TRIG", "CHOLHDL", "LDLDIRECT" in the last 72 hours. Thyroid Function Tests: No results for input(s): "TSH", "T4TOTAL", "T3FREE", "THYROIDAB" in the last 72 hours.  Invalid input(s): "FREET3" Anemia Panel: No results for input(s): "VITAMINB12", "FOLATE", "FERRITIN", "TIBC", "IRON", "RETICCTPCT" in the last 72 hours.   PHYSICAL EXAM General: Well developed, well nourished, in no acute distress HEENT:  Normocephalic and atramatic Neck:  No JVD.  Lungs: Clear bilaterally to auscultation and percussion. Heart: HRRR . Normal S1 and S2 without gallops or murmurs.  Abdomen: Bowel sounds are positive, abdomen soft and non-tender  Msk:  Back normal, normal gait. Normal strength and tone for age. Extremities: No clubbing, cyanosis or edema.   Neuro: Alert and oriented X 3. Psych:  Good affect, responds appropriately  TELEMETRY: sinus rhythm  ASSESSMENT AND PLAN: Patient resting comfortably in bed. Denies chest pain. Shortness of breath improved. Aspirin discontinued yesterday. Continue Plavix and Aspirin. Patient is good to go home with follow-up in the office this Friday at 9 AM.  Principal Problem:   NSTEMI (  non-ST elevated myocardial infarction) [facility]) Active Problems:   Anemia of chronic renal failure, stage 5 (HCC)   End stage renal disease (HCC)   Essential hypertension   Volume overload   Hyperkalemia   Pulmonary edema   Cardiac arrhythmia   Syncope and collapse   Metabolic encephalopathy    Tymothy Cass, FNP-C 07/02/2022 8:54 AM

## 2022-07-02 NOTE — Progress Notes (Signed)
Established hemodialysis patient known at [facility] twice a week Mon. and Fri.  11:00am. Patient stated that she normally drives herself to treatment, no dialysis concerns were stated.

## 2022-07-02 NOTE — Progress Notes (Signed)
Progress Note   Patient: Kathleen Sharp XOV:[id number] DOB: 1945-11-29 DOA: 06/27/2022     8 DOS: the patient was seen and examined on 07/02/2022   Brief hospital course: Ms. Kathleen Sharp is a 76 year old female with history of end-stage renal disease on hemodialysis Monday Wednesday 12-12-22, hypertension, cardiomegaly, hyperlipidemia, who presents emergency department for chief concerns of shortness of breath.  Patient was complaining of shortness of breath with associated flushed face and palpitations.  No chest pain, nausea or vomiting.  Initial vitals in the emergency department showed temperature of 97.5, respiration rate of 29, heart rate of 95, blood pressure 108/87, SPO2 of 96% on 4 L nasal cannula.  Serum sodium is 135, potassium 5.9, chloride of 97, bicarb 20, BUN of 63, serum creatinine of 9.33, GFR 4, nonfasting blood glucose 172, WBC 5.9, hemoglobin 11.8, platelets of 269.  Lactic acid is 4.1.  High sensitive troponin is 89.  COVID/influenza A/influenza B PCR negative.  ED treatment: Diltiazem 10 mg IV one-time dose, Lokelma p.o., insulin aspart 10 mg, D50, calcium gluconate, vancomycin and cefepime.  10/9: Patient overnight had another episode of dyspnea with flushed face and palpitations.  Found to have supraventricular tachycardia requiring IV diltiazem push.  Troponin increased to above 10,000, peaked at 14,207>>13,306.  Potassium at 6.1, BUN 17, creatinine 10.41, it yesterday 144, ALT 77 and anion gap of 16.  BNP >4500, D-dimer 2.66>>3.10. EKG with SVT, peaked T in anteroseptal and ST depression in lateral leads. Patient was started on heparin infusion by night on-call, cardiology and nephrology was consulted.  Going for dialysis this morning. Per patient she never missed any dialysis, last dialysis was on Saturday as she was unable to go for her 2022/12/12 session due to the death of a dear friend. Echocardiogram pending. Patient refused cardiac catheterization.  10/10:  Patient had 2 syncopal episodes, one last night and 1 this a.m. rapid response was called.  During this morning episode patient all of a sudden became unresponsive and appears very confused.  Initial vitals were stable.  Telemetry noted nonsustained V. tach followed by atrial fibrillation with RVR.  While preparing for amiodarone bolus patient converted back to sinus rhythm with heart rate in 60s.  CT chest and CTA was obtained to rule out CVA which was negative for any acute infarct and CTA of head and neck was negative for any significant large vessel stenosis. Her cardiologist was also consulted and she was placed on amiodarone infusion to prevent further arrhythmias. She also received 500 cc of bolus as blood pressure become softer little after this episode.  Hyperkalemia resolved.  Blood pressure little soft, Patient later agrees with cardiac catheterization which is scheduled for Thursday morning now.  Troponin at 11,321 after peaking at 14,207.  Echocardiogram with normal EF, indeterminate diastolic function, no regional wall motion abnormalities and severe calcification of aortic valve without any evidence of stenosis.  10/11: Patient again very lethargic but following some simple commands when seen during morning rounds.  Blood pressure remained on softer side but improved from yesterday.  Telemetry with some paired PVCs and bigeminy. Going for dialysis today.  Cardiac cath planned for tomorrow morning, n.p.o. after midnight.  We will continue with amiodarone and heparin infusion.  Patient will remain high risk for decompensation and other cardiac arrhythmias based on her underlying comorbidities and recent NSTEMI.    Palliative care was consulted.  10/12: Patient continues to be lethargic.  Overnight rapid response was called secondary to change in mental status.  ABG was done which showed elevated PCO2.  Patient was placed on BiPAP which she has been very noncompliant with.  However this  morning when I saw her she was able to identify her relatives at bedside and was a little bit more compliant in wearing the BiPAP.  Cardiology had planned for a heart catheterization today but secondary to her change in mental status this is being canceled.  10/13: No overnight concerns.  Patient had a shorter dialysis session today due to agitation and requesting to stop it so it was terminated prematurely.  Complaining of lower substernal pain. Discussed with cardiology and her heparin infusion will be discontinued as she is on heparin infusion for more than 72 hours now.  She was started on aspirin along with Plavix. Cardiac cath is not scheduled for Monday at 7:30 AM. She will continue on amiodarone infusion per cardiology as she continued to have transient tachyarrhythmias.  10/14: Patient had another session of dialysis with removal of 700 UF, becoming intermittently hypotensive, otherwise completed the session.  More alert and oriented today.   Had a long discussion with niece about her risk of deterioration and decompensation.  Cath is now planned for Monday.  10/15: Overnight patient became unresponsive, bradycardic and hypotensive.  Amiodarone infusion was discontinued, patient received atropine and was placed on Levophed.  Transferred to ICU.  A-line was placed, which showed normal blood pressure so pressors were held.  Patient was also placed on BiPAP.  A.m. labs with resolution of leukocytosis, hemoglobin decreased to 8.6 from 9.5 with no obvious bleeding.  Troponin continued to trend down, at 2260 today. Patient again became bradycardic and hypotensive after receiving morning dose of Coreg which was placed on hold. After talking with 2 nieces at bedside-CODE [place] changed to DNR with full scope of medical care.  Patient is high risk for deterioration, decompensation and mortality.  Decreased tolerance of dialysis along with cardiac arrhythmias with this recent NSTEMI. Palliative care  was also consulted.  10/16: Patient's blood pressure continues to be borderline.  Patient however underwent heart catheterization which was showing nonobstructive coronary artery disease and echocardiogram showing preserved ejection fraction of 55%.  Patient is currently on 3 L of oxygen via nasal cannula going to undergo hemodialysis.  Metabolic encephalopathy has completely cleared patient is alert oriented and back to baseline.  10/16: Recorded blood pressures are low which she had a cuff blood pressures have LVAD nurse reported that the A-line blood pressures were stable in the 883 systolic range.  Patient's hemoglobin has trended from 9.5-7.3.  Will get transfused with 1 unit of packed red blood cell.  Given that patient is on Eliquis and has never had a GI eval consulted gastroenterologist for further opinion.  Assessment and Plan: * NSTEMI (non-ST elevated myocardial infarction) ([place]) Significant increase in troponin, which peaked above 14,000, makes her in the range of NSTEMI.  EKG with ST depression in lateral leads along with SVT. Troponin continued to trend down, at 2260 today. Cardiology was consulted she was started on heparin infusion-which she continued more than 72 hours so it was discontinued BNP markedly elevated above 4500   Patient underwent cardiac catheterization today on 10/16 which revealed nonobstructive coronary artery disease.  Left ventricular ejection fraction on echo was 55% and sent left ventriculogram was not done. - Cardiologist recommended to discontinue aspirin and continue Plavix and Eliquis added because patient had an episode of atrial fibrillation. -Continue to monitor -Patient currently does not have any active symptoms.  Blood pressure currently however is slightly borderline.  Reports over the A-line blood pressures are stable and cuff blood pressures are inaccurate given that they are getting it from the calf.  Cardiac arrhythmia Patient is experiencing  transient, self-limiting cardiac arrhythmias where she became symptomatic.  Had 2 syncopal episode during current hospitalization.  Most likely secondary to recent NSTEMI. Cardiology started her on amiodarone infusion, which was discontinued due to symptomatic bradycardia overnight requiring atropine. -Continue with telemetry monitoring -Patient is started on Eliquis by cardiology now.  Syncope and collapse Delirium/intermittent altered mental status. Patient again became altered with hypotension and bradycardia requiring transfer to stepdown. Bradycardia improved after getting a dose of atropine.  A-line shows normal blood pressure, peripheral reading remains low, pressors were held. CT head was negative for any acute infarct.  Chronic lacunar infarcts noted. CTA head and neck was negative for any large vessel occlusion.  Some stenosis noted as mentioned in full report. -Continue to monitor   Pulmonary edema - Presumed secondary to SVT and NSTEMI - Received her dialysis on admission and continuing routine dialysis at this time. -Intermittently becoming hypoxic and was placed on BiPAP.  Most likely some low perfusion and not picking up saturation very well.  ABG without any hypoxia - She  is currently on 3 L of oxygen via nasal cannula.  Patient is undergoing hemodialysis last treatment  was on 63/33  Metabolic encephalopathy Patient is exhibiting signs of delirium with intermittent change in mental status.  Underlying cardiac arrhythmias suspected.  She is becoming more confused after dialysis. Patient was placed on BiPAP overnight. Encephalopathy resolved currently patient is able to converse normally and back to baseline.   Volume overload - Presumed secondary to SVT and NSTEMI - Nephrology has been consulted for dialysis - Continue with oxygen-wean as tolerated  Hyperkalemia Resolved with dialysis. -Continue to monitor -Patient is high risk for cardiac arrhythmia  End stage  renal disease [facility]) Patient received an extra session of dialysis today as she was unable to complete her routine yesterday due to some agitation. - Via left upper extremity AV fistula - Monday, Wednesday, Friday -Nephrology is on board -Continue with routine dialysis, patient will undergo HD today  Essential hypertension - Patient takes Coreg 6.25 mg twice daily, lisinopril 40 mg daily Blood pressure seems improving with intermittently mildly elevated readings. - Currently blood pressure is still borderline -Keep holding home antihypertensives -Continue to monitor  Anemia of chronic renal failure, stage 5 (HCC) Some decrease in hemoglobin to 8.6, no obvious bleeding. -Continue to monitor -Transfuse if below 8 -Patient's hemoglobin today is 7.3 but 4 days ago was 9.5. - Patient has never had a GI eval, gastroenterologist has been consulted given that the patient is currently being initiated on Eliquis.   Subjective: Patient was seen and examined today.  She was alert and oriented.  She denied having any chest pain or shortness of breath. Physical Exam: Vitals:   07/02/22 1115 07/02/22 1125 07/02/22 1130 07/02/22 1145  BP:      Pulse: 77     Resp: (!) 23  (!) 22 (!) 26  Temp: 97.9 F (36.6 C)  98 F (36.7 C)   TempSrc: Oral  Oral   SpO2: 92% 98% 98% 99%  Weight:      Height:       General.  Ill-appearing elderly lady, in no acute distress. Pulmonary.  Lungs clear bilaterally, normal respiratory effort. CV.  Regular rate and rhythm, no JVD, rub or murmur.  Abdomen.  Soft, nontender, nondistended, BS positive. CNS.  Alert and oriented .  No focal neurologic deficit. Extremities.  No edema, no cyanosis, pulses intact and symmetrical. Psychiatry.  Judgment and insight appears normal.   Data Reviewed: Prior data reviewed  Family Communication: Discussed with 2 nieces at bedside.  Disposition: Status is: Inpatient Remains inpatient appropriate because: Severity of  illness   Planned Discharge Destination: Home with Home Health  DVT prophylaxis.  Heparin infusion Time spent: 50 minutes  This record has been created using Systems analyst. Errors have been sought and corrected,but may not always be located. Such creation errors do not reflect on the standard of care.  Author: Carlyle Lipa, MD 07/02/2022 12:12 PM  For on call review [email or web address].

## 2022-07-03 ENCOUNTER — Inpatient Hospital Stay: Payer: Medicare HMO

## 2022-07-03 ENCOUNTER — Encounter: Payer: Self-pay | Admitting: Internal Medicine

## 2022-07-03 DIAGNOSIS — I214 Non-ST elevation (NSTEMI) myocardial infarction: Secondary | ICD-10-CM | POA: Diagnosis not present

## 2022-07-03 DIAGNOSIS — D62 Acute posthemorrhagic anemia: Secondary | ICD-10-CM

## 2022-07-03 DIAGNOSIS — T81718A Complication of other artery following a procedure, not elsewhere classified, initial encounter: Secondary | ICD-10-CM | POA: Diagnosis not present

## 2022-07-03 DIAGNOSIS — I729 Aneurysm of unspecified site: Secondary | ICD-10-CM | POA: Diagnosis not present

## 2022-07-03 LAB — LIPOPROTEIN A (LPA): Lipoprotein (a): 90 nmol/L — ABNORMAL HIGH (ref <75.0)

## 2022-07-03 LAB — CBC
HCT: 21.9 % — ABNORMAL LOW (ref 36.0–46.0)
Hemoglobin: 7.1 g/dL — ABNORMAL LOW (ref 12.0–15.0)
MCH: 26.9 pg (ref 26.0–34.0)
MCHC: 32.4 g/dL (ref 30.0–36.0)
MCV: 83 fL (ref 80.0–100.0)
Platelets: 124 10*3/uL — ABNORMAL LOW (ref 150–400)
RBC: 2.64 MIL/uL — ABNORMAL LOW (ref 3.87–5.11)
RDW: 16.4 % — ABNORMAL HIGH (ref 11.5–15.5)
WBC: 19.5 10*3/uL — ABNORMAL HIGH (ref 4.0–10.5)
nRBC: 0.3 % — ABNORMAL HIGH (ref 0.0–0.2)

## 2022-07-03 LAB — GLUCOSE, CAPILLARY
Glucose-Capillary: 100 mg/dL — ABNORMAL HIGH (ref 70–99)
Glucose-Capillary: 124 mg/dL — ABNORMAL HIGH (ref 70–99)
Glucose-Capillary: 126 mg/dL — ABNORMAL HIGH (ref 70–99)
Glucose-Capillary: 13 mg/dL — CL (ref 70–99)
Glucose-Capillary: 168 mg/dL — ABNORMAL HIGH (ref 70–99)
Glucose-Capillary: 288 mg/dL — ABNORMAL HIGH (ref 70–99)
Glucose-Capillary: 40 mg/dL — CL (ref 70–99)
Glucose-Capillary: 53 mg/dL — ABNORMAL LOW (ref 70–99)
Glucose-Capillary: 64 mg/dL — ABNORMAL LOW (ref 70–99)
Glucose-Capillary: 74 mg/dL (ref 70–99)
Glucose-Capillary: 79 mg/dL (ref 70–99)
Glucose-Capillary: 81 mg/dL (ref 70–99)

## 2022-07-03 LAB — CBC WITH DIFFERENTIAL/PLATELET
Abs Immature Granulocytes: 0.84 10*3/uL — ABNORMAL HIGH (ref 0.00–0.07)
Basophils Absolute: 0.1 10*3/uL (ref 0.0–0.1)
Basophils Relative: 1 %
Eosinophils Absolute: 0.2 10*3/uL (ref 0.0–0.5)
Eosinophils Relative: 1 %
HCT: 19.9 % — ABNORMAL LOW (ref 36.0–46.0)
Hemoglobin: 6.3 g/dL — ABNORMAL LOW (ref 12.0–15.0)
Immature Granulocytes: 5 %
Lymphocytes Relative: 15 %
Lymphs Abs: 2.7 10*3/uL (ref 0.7–4.0)
MCH: 25.8 pg — ABNORMAL LOW (ref 26.0–34.0)
MCHC: 31.7 g/dL (ref 30.0–36.0)
MCV: 81.6 fL (ref 80.0–100.0)
Monocytes Absolute: 2.4 10*3/uL — ABNORMAL HIGH (ref 0.1–1.0)
Monocytes Relative: 13 %
Neutro Abs: 12.2 10*3/uL — ABNORMAL HIGH (ref 1.7–7.7)
Neutrophils Relative %: 65 %
Platelets: 141 10*3/uL — ABNORMAL LOW (ref 150–400)
RBC: 2.44 MIL/uL — ABNORMAL LOW (ref 3.87–5.11)
RDW: 15.9 % — ABNORMAL HIGH (ref 11.5–15.5)
WBC: 18.5 10*3/uL — ABNORMAL HIGH (ref 4.0–10.5)
nRBC: 0.3 % — ABNORMAL HIGH (ref 0.0–0.2)

## 2022-07-03 LAB — HEMOGLOBIN AND HEMATOCRIT, BLOOD
HCT: 17.3 % — ABNORMAL LOW (ref 36.0–46.0)
Hemoglobin: 6 g/dL — ABNORMAL LOW (ref 12.0–15.0)

## 2022-07-03 LAB — LACTIC ACID, PLASMA
Lactic Acid, Venous: 1.3 mmol/L (ref 0.5–1.9)
Lactic Acid, Venous: 1.3 mmol/L (ref 0.5–1.9)

## 2022-07-03 LAB — BASIC METABOLIC PANEL
Anion gap: 8 (ref 5–15)
BUN: 96 mg/dL — ABNORMAL HIGH (ref 8–23)
CO2: 27 mmol/L (ref 22–32)
Calcium: 8.1 mg/dL — ABNORMAL LOW (ref 8.9–10.3)
Chloride: 98 mmol/L (ref 98–111)
Creatinine, Ser: 6.08 mg/dL — ABNORMAL HIGH (ref 0.44–1.00)
GFR, Estimated: 7 mL/min — ABNORMAL LOW (ref >60)
Glucose, Bld: 103 mg/dL — ABNORMAL HIGH (ref 70–99)
Potassium: 4.1 mmol/L (ref 3.5–5.1)
Sodium: 133 mmol/L — ABNORMAL LOW (ref 135–145)

## 2022-07-03 LAB — PHOSPHORUS: Phosphorus: 2.7 mg/dL (ref 2.5–4.6)

## 2022-07-03 LAB — PREPARE RBC (CROSSMATCH)

## 2022-07-03 LAB — MAGNESIUM: Magnesium: 1.9 mg/dL (ref 1.7–2.4)

## 2022-07-03 LAB — PROTIME-INR
INR: 2.2 — ABNORMAL HIGH (ref 0.8–1.2)
Prothrombin Time: 24.3 seconds — ABNORMAL HIGH (ref 11.4–15.2)

## 2022-07-03 LAB — OCCULT BLOOD X 1 CARD TO LAB, STOOL: Fecal Occult Bld: POSITIVE — AB

## 2022-07-03 MED ORDER — DEXTROSE 50 % IV SOLN
1.0000 | Freq: Once | INTRAVENOUS | Status: AC
  Administered 2022-07-03: 50 mL via INTRAVENOUS
  Filled 2022-07-03: qty 50

## 2022-07-03 MED ORDER — OXYCODONE HCL 5 MG PO TABS: 5.0000 mg | ORAL_TABLET | ORAL | Status: DC | PRN

## 2022-07-03 MED ORDER — NOREPINEPHRINE 16 MG/250ML-% IV SOLN
0.0000 ug/min | INTRAVENOUS | Status: DC
  Administered 2022-07-03: 5 ug/min via INTRAVENOUS
  Administered 2022-07-04: 14 ug/min via INTRAVENOUS
  Administered 2022-07-06: 8 ug/min via INTRAVENOUS
  Filled 2022-07-03 (×4): qty 250

## 2022-07-03 MED ORDER — DEXTROSE 50 % IV SOLN
INTRAVENOUS | Status: AC
  Filled 2022-07-03: qty 50

## 2022-07-03 MED ORDER — HALOPERIDOL 2 MG PO TABS: 2.0000 mg | ORAL_TABLET | Freq: Four times a day (QID) | ORAL | Status: DC | PRN

## 2022-07-03 MED ORDER — DEXTROSE 50 % IV SOLN
12.5000 g | Freq: Once | INTRAVENOUS | Status: AC
  Administered 2022-07-03: 12.5 g via INTRAVENOUS
  Filled 2022-07-03: qty 50

## 2022-07-03 MED ORDER — SENNOSIDES-DOCUSATE SODIUM 8.6-50 MG PO TABS: 1.0000 | ORAL_TABLET | Freq: Every evening | ORAL | Status: DC | PRN

## 2022-07-03 MED ORDER — IPRATROPIUM-ALBUTEROL 0.5-2.5 (3) MG/3ML IN SOLN
3.0000 mL | RESPIRATORY_TRACT | Status: DC | PRN
  Administered 2022-07-03 – 2022-07-05 (×2): 3 mL via RESPIRATORY_TRACT
  Filled 2022-07-03 (×2): qty 3

## 2022-07-03 MED ORDER — DEXTROSE 10 % IV SOLN: INTRAVENOUS | Status: DC

## 2022-07-03 MED ORDER — DEXTROSE 50 % IV SOLN
1.0000 | Freq: Once | INTRAVENOUS | Status: AC
  Administered 2022-07-03: 50 mL via INTRAVENOUS

## 2022-07-03 MED ORDER — FENTANYL CITRATE PF 50 MCG/ML IJ SOSY
12.5000 ug | PREFILLED_SYRINGE | INTRAMUSCULAR | Status: DC | PRN
  Filled 2022-07-03 (×2): qty 1

## 2022-07-03 MED ORDER — DEXTROSE 50 % IV SOLN
12.5000 g | Freq: Once | INTRAVENOUS | Status: AC
  Administered 2022-07-03: 12.5 g via INTRAVENOUS

## 2022-07-03 MED ORDER — METOPROLOL TARTRATE 5 MG/5ML IV SOLN: 5.0000 mg | INTRAVENOUS | Status: DC | PRN

## 2022-07-03 MED ORDER — MORPHINE SULFATE (PF) 2 MG/ML IV SOLN
1.0000 mg | Freq: Once | INTRAVENOUS | Status: AC
  Administered 2022-07-03: 1 mg via INTRAVENOUS
  Filled 2022-07-03: qty 1

## 2022-07-03 MED ORDER — HYDRALAZINE HCL 20 MG/ML IJ SOLN: 10.0000 mg | INTRAMUSCULAR | Status: DC | PRN

## 2022-07-03 MED ORDER — SODIUM CHLORIDE 0.9% IV SOLUTION: Freq: Once | INTRAVENOUS | Status: AC

## 2022-07-03 MED ORDER — IOHEXOL 350 MG/ML SOLN
100.0000 mL | Freq: Once | INTRAVENOUS | Status: AC | PRN
  Administered 2022-07-03: 100 mL via INTRAVENOUS

## 2022-07-03 MED ORDER — PANTOPRAZOLE SODIUM 40 MG IV SOLR
40.0000 mg | Freq: Two times a day (BID) | INTRAVENOUS | Status: DC
  Administered 2022-07-03 – 2022-07-07 (×9): 40 mg via INTRAVENOUS
  Filled 2022-07-03 (×9): qty 10

## 2022-07-03 MED ORDER — ONDANSETRON HCL 4 MG/2ML IJ SOLN: 4.0000 mg | Freq: Four times a day (QID) | INTRAMUSCULAR | Status: DC | PRN

## 2022-07-03 NOTE — Progress Notes (Addendum)
NAME:  Kathleen Sharp, MRN:  [id number], DOB:  03/24/1946, LOS: 9 ADMISSION DATE:  07/09/2022, CONSULTATION DATE:  06/30/22 REFERRING MD:  Manuela Schwartz  REASON FOR CONSULT:  Hypotension    HPI  76 y.o  female with significant PMH of HTN, HLD, Cardiomegaly, ESRD on HD MWF who presented to the ED with chief complaints of SOB   ED Course: Initial vital signs showed HR of 95 beats/minute, BP mm Hg, the RR 29 breaths/minute, and the oxygen saturation 96 % on  4L and a temperature of 97.13F.   Pertinent Labs/Diagnostics Findings: Chemistry:Na+/ K+: 135/5.9 Glucose:172  BUN/Cr.:63/9.33 CBC: WBC:5.9  Hgb:11.8 Plts: 269 Other Lab findings: Lactic acid: 4.1 COVID PCR: Negative, Troponin: 89  Imaging:  CXR>1. Moderate pulmonary edema. 2. Moderate left and small right pleural effusions. 3. Cardiomegaly.uconate, vancomycin and cefepime Medications Administered: Diltiazem 10 mg IV one-time dose for SVT, Lokelma p.o., insulin aspart 10 mg, D50, calcium glI  Patient was admitted to hospitalist service for further management. See significant events for hospital course  Past Medical History  HTN, HLD, Cardiomegaly, ESRD on HD MWF  Significant Hospital Events   10/8: Admitted to PCU with Acute respiratory failure in the setting of pulmonary edema. Patient went into SVT requiring Diltiazem IV push  and Hyperkalemia correction. She was also started on Heparin gtt for NSTEMI. 10/9:Cardiology consulted who recommended cath but patient declined. Started on Plavix pending Echo. Nephrology consulted s/p urgent HD for volume removal. Patient had a syncopal episode. EF normal on Echo. 10/10:Started on IV amiodarone gtt for nonsustained ventricular tachycardia followed by atrial fibrillation associated with dizziness spell.CT Chest/CTA negative for CVA 10/11:Post HD fluid removed 10/12: Rapid response called for altered mental status and hypoglycemia, VBG showed hypercapnia, patient placed on BiPAP.  Patient initially declined cath but later consented however due to change in mental status cardiac cath was cancelled. 10/13:Remained on Amiodarone gtt.  10/14:Pt completed 2.15 hours of 3.5 hour HD treatment. 700 ml removed patient became hypotensive but completed session. 10/15: Rapid response called for unresponsiveness, bradycardia and hypotension. Noted with cool extremities. Albumin administered, Amiodarone stopped and Atropine 0.5 mg administered x 2. Patient transferred to  the ICU. PCCM consulted. On arrival, art line placed which showed BP readings in the 140's systolic with MAP>99. Pressors held. Remains on BiPAP  10/18: PCCM reconsulted pt developed acute respiratory failure secondary to pulmonary edema currently requiring 2L O2 via nasal canula.  Pt pending hemodialysis session   Consults:  Nephrology Cardiology  Procedures:  Cardiac Catheterization 10/16:  Prox RCA lesion is 30% stenosed. Dist Cx lesion is 30% stenosed.Dist LAD lesion is 30% stenosed. The left ventricular systolic function is normal. The left ventricular ejection fraction is 50-55% by visual estimate.  Significant Diagnostic Tests:  06/25/22: CT head: No evidence of acute intracranial abnormality. Moderate chronic small ischemic disease within the cerebral white matter. Small chronic lacunar infarct within the left caudate head. Mild generalized cerebral atrophy. Left sphenoid sinusitis.   06/25/22: CTA neck: The common carotid and internal carotid arteries are patent within the neck without hemodynamically significant stenosis. Atherosclerotic plaque, bilaterally. Vertebral arteries patent within the neck. Mild atherosclerotic narrowing at the origin of the dominant right vertebral artery. Aortic Atherosclerosis (ICD10-I70.0) and Emphysema (ICD10-J43.9).   06/25/22: CTA head: No intracranial large vessel occlusion is identified. Intracranial atherosclerotic disease with multifocal stenoses, most notably as  follows. Severe stenosis within the left vertebral artery proximal V4 segment. Mild-to-moderate stenosis within the paraclinoid right ICA.  06/25/22: CXR: Slightly improved aeration of bilateral lungs with persistent interstitial pulmonary opacities, most consistent with pulmonary edema. Small bilateral pleural effusions.Cardiomegaly.  10/18: CTA GI Bleed: No CT evidence of active gastrointestinal bleed. Focal outpouching of the anterior right common femoral artery with adjacent soft tissue thickening/possible small hematoma. This could represent a pseudoaneurysm.Recommend targeted ultrasound. No other acute findings in the abdomen or pelvis. Trace pleural effusions.  Cardiomegaly   Micro Data:  10/8: SARS-CoV-2 PCR> negative 10/8: Influenza PCR> negative 10/9: MRSA PCR>>negative  Antimicrobials:  None  OBJECTIVE  Blood pressure (!) 84/56, pulse 82, temperature 97.7 F (36.5 C), temperature source Oral, resp. rate (!) 26, height $RemoveBe'5\' 2"'fJNVknArx$  (1.575 m), weight 85.5 kg, SpO2 96 %.        Intake/Output Summary (Last 24 hours) at 07/03/2022 1301 Last data filed at 07/03/2022 0745 Gross per 24 hour  Intake 390 ml  Output --  Net 390 ml   Filed Weights   06/26/22 0604 06/26/22 1342 06/29/22 0415  Weight: 81.6 kg 83.6 kg 85.5 kg    Physical Examination  GENERAL: Acute on chronically-ill appearing female in mild respiratory distress  HEENT: Head atraumatic, normocephalic. Supple, no JVD. LUNGS: Diffuse rhonchi and wheezes throughout, mild tachypnea  CARDIOVASCULAR: NSR, s1s2, rrr, no r/g, 2+ radial/1+ distal pulses, no edema  ABDOMEN: +BS x4, obese, soft, non tender, non distended   EXTREMITIES: Normal bulk and tone.  Moves all extremities. LUE fistula +bruit/thrill   NEUROLOGIC: Alert and confused to situation at times. Following commands  SKIN: No obvious rash, lesion, or ulcer.   Labs/imaging that I havepersonally reviewed  (right click and "Reselect all SmartList Selections" daily)      Labs   CBC: Recent Labs  Lab 06/30/22 0555 07/11/2022 0403 07/02/22 0445 07/02/22 1603 07/03/22 0353 07/03/22 1153  WBC 9.9 10.7* 11.0*  --  18.5* 19.5*  NEUTROABS  --  7.9* 8.0*  --  12.2*  --   HGB 8.6* 8.1* 7.3* 7.7* 6.3* 7.1*  HCT 27.9* 25.8* 23.9* 24.6* 19.9* 21.9*  MCV 80.6 81.6 83.3  --  81.6 83.0  PLT 176 162 147*  --  141* 124*    Basic Metabolic Panel: Recent Labs  Lab 06/29/22 0428 06/30/22 0555 06/25/2022 0403 07/02/22 0445 07/03/22 0353  NA 130* 132* 132* 135 133*  K 3.9 4.0 4.3 3.6 4.1  CL 92* 94* 94* 98 98  CO2 $Re'26 27 28 29 27  'qkF$ GLUCOSE 136* 110* 100* 100* 103*  BUN 49* 30* 41* 33* 96*  CREATININE 7.35* 5.87* 7.37* 4.69* 6.08*  CALCIUM 8.6* 8.5* 8.5* 8.3* 8.1*  MG  --  1.6* 2.5* 1.9 1.9  PHOS  --   --  5.2* 3.5 2.7   GFR: Estimated Creatinine Clearance: 8.2 mL/min (A) (by C-G formula based on SCr of 6.08 mg/dL (H)). Recent Labs  Lab 07/02/2022 0403 07/02/22 0445 07/03/22 0353 07/03/22 1153  WBC 10.7* 11.0* 18.5* 19.5*    Liver Function Tests: Recent Labs  Lab 06/27/22 1045 06/28/22 0635 06/30/22 0555  AST 169* 85* 29  ALT 342* 244* 124*  ALKPHOS 69 61 52  BILITOT 0.8 0.4 0.4  PROT 6.6 6.0* 6.4*  ALBUMIN 3.5 3.2* 3.7   No results for input(s): "LIPASE", "AMYLASE" in the last 168 hours. No results for input(s): "AMMONIA" in the last 168 hours.  ABG    Component Value Date/Time   PHART 7.35 06/30/2022 0730   PCO2ART 52 (H) 06/30/2022 0730   PO2ART 196 (H) 06/30/2022 0730  HCO3 28.7 (H) 06/30/2022 0730   O2SAT 100 06/30/2022 0730     Coagulation Profile: No results for input(s): "INR", "PROTIME" in the last 168 hours.   Cardiac Enzymes: No results for input(s): "CKTOTAL", "CKMB", "CKMBINDEX", "TROPONINI" in the last 168 hours.  HbA1C: No results found for: "HGBA1C"  CBG: Recent Labs  Lab 07/03/22 0014 07/03/22 0344 07/03/22 0733 07/03/22 0817 07/03/22 1134  GLUCAP 79 81 64* 53* 74    Review of Systems: Positives  in BOLD   Gen: Denies fever, chills, weight change, fatigue, night sweats HEENT: Denies blurred vision, double vision, hearing loss, tinnitus, sinus congestion, rhinorrhea, sore throat, neck stiffness, dysphagia PULM: shortness of breath, cough, sputum production, hemoptysis, wheezing CV: Denies chest pain, edema, orthopnea, paroxysmal nocturnal dyspnea, palpitations GI: Denies abdominal pain, nausea, vomiting, diarrhea, hematochezia, melena, constipation, change in bowel habits GU: Denies dysuria, hematuria, polyuria, oliguria, urethral discharge Endocrine: Denies hot or cold intolerance, polyuria, polyphagia or appetite change Derm: Denies rash, dry skin, scaling or peeling skin change Heme: Denies easy bruising, bleeding, bleeding gums Neuro: Denies headache, numbness, weakness, slurred speech, loss of memory or consciousness  Past Medical History  She,  has a past medical history of Anemia, Arthritis, Chronic kidney disease, Hypercholesterolemia, and Hypertension.   Surgical History    Past Surgical History:  Procedure Laterality Date   ABDOMINAL HYSTERECTOMY     AV FISTULA PLACEMENT Left 12/15/2015   Procedure: INSERTION OF ARTERIOVENOUS (AV) GORE-TEX GRAFT ARM             ( BRACHIAL AXILLARY GRAFT );  Surgeon: [name], MD;  Location: [facility];  Service: Vascular;  Laterality: Left;   CAPD INSERTION N/A 11/24/2015   Procedure: Exploratory laparoscopy;  Surgeon: [name], MD;  Location: [facility];  Service: Vascular;  Laterality: N/A;, attempted insertion but unable due to scar tissue   LEFT HEART CATH AND CORONARY ANGIOGRAPHY N/A 07/04/2022   Procedure: LEFT HEART CATH AND CORONARY ANGIOGRAPHY;  Surgeon: Dionisio David, MD;  Location: [place] [facility];  Service: Cardiovascular;  Laterality: N/A;   PERIPHERAL VASCULAR CATHETERIZATION Left 02/27/2016   Procedure: Thrombectomy;  Surgeon: [name], MD;  Location: [place] [facility];  Service: Cardiovascular;   Laterality: Left;   PERIPHERAL VASCULAR CATHETERIZATION Left 04/12/2016   Procedure: Thrombectomy;  Surgeon: [name], MD;  Location: [place] [facility];  Service: Cardiovascular;  Laterality: Left;   PERIPHERAL VASCULAR CATHETERIZATION Left 05/22/2016   Procedure: Thrombectomy;  Surgeon: [name], MD;  Location: [place] [facility];  Service: Cardiovascular;  Laterality: Left;   PERIPHERAL VASCULAR CATHETERIZATION N/A 05/22/2016   Procedure: A/V Shuntogram/Fistulagram;  Surgeon: [name], MD;  Location: [place] [facility];  Service: Cardiovascular;  Laterality: N/A;   PERIPHERAL VASCULAR CATHETERIZATION Left 06/21/2016   Procedure: Thrombectomy;  Surgeon: [name], MD;  Location: [place] [facility];  Service: Cardiovascular;  Laterality: Left;     Social History   reports that she quit smoking about 9 years ago. Her smoking use included cigarettes. She has a 15.00 pack-year smoking history. She has never used smokeless tobacco. She reports that she does not drink alcohol and does not use drugs.   Family History   Her family history includes Heart disease in her mother; Kidney disease in her brother and father.   Allergies No Known Allergies   Home Medications  Prior to Admission medications   Medication Sig Start Date End Date Taking? Authorizing Provider  carvedilol (COREG) 6.25 MG tablet Take 6.25 mg by mouth 2 (two) times daily with a meal.   Yes [provider]  clopidogrel (PLAVIX) 75 MG tablet Take 75 mg by mouth daily.   Yes [provider]  lidocaine-prilocaine (EMLA) cream Apply 1 Application topically daily as needed (dialysis treatment).   Yes [provider]  lisinopril (ZESTRIL) 40 MG tablet Take 40 mg by mouth daily.   Yes [provider]  loratadine (CLARITIN) 10 MG tablet Take 10 mg by mouth daily.   Yes [provider]  Multiple Vitamins-Minerals (MULTIVITAMIN WITH MINERALS) tablet Take 1  tablet by mouth daily.   Yes [provider]  sevelamer carbonate (RENVELA) 800 MG tablet Take 1,600 mg by mouth 3 (three) times daily with meals.   Yes [provider]    Scheduled Meds:  sodium chloride   Intravenous Once   atorvastatin  20 mg Oral q1800   atropine  0.5 mg Intravenous Once   Chlorhexidine Gluconate Cloth  6 each Topical Q0600   pantoprazole (PROTONIX) IV  40 mg Intravenous Q12H   sevelamer carbonate  1,600 mg Oral TID WC   sodium chloride flush  10-40 mL Intracatheter Q12H   sodium chloride flush  3 mL Intravenous Q12H   sodium chloride flush  3 mL Intravenous Q12H   sodium chloride flush  3 mL Intravenous Q12H   Continuous Infusions:  sodium chloride     PRN Meds:.sodium chloride, acetaminophen, guaiFENesin, hydrALAZINE, ipratropium-albuterol, metoprolol tartrate, ondansetron (ZOFRAN) IV, oxyCODONE, senna-docusate, sodium chloride flush, sodium chloride flush   Active Hospital Problem list       Assessment & Plan:  Acute hypoxic hypercapnic respiratory failure secondary to pulmonary edema - Supplemental O2 as needed to maintain O2 saturations 88 to 92% - Prn Bipap for dyspnea and/or hypoxia  - Follow intermittent ABG and chest x-ray as needed - Will need HD for volume removal today - As needed bronchodilators   NSTEMI Hypertension  PMHx: CAD, HLD, HTN   Cardiac Cath 10/16: minor luminal irregularities about 30% in LAD circumflex and RCA with no significant coronary artery disease.  EF 50-55% - Continuous cardiac monitoring - Vasopressors as needed to maintain MAP >65 - Prn metoprolol for heart rate control  - Hold outpatient aspirin and plavix due to anemia  - Atorvastatin $RemoveBefore'20mg'[id number]$  PO daily - Cardiology following, appreciate input  AKI on ESRD on HD MWF - Trend BMP  - Replace electrolytes as indicated  - Avoid nephrotoxic medications as indicated  - Nephrology following appreciate input: pt to receive HD today   Anemia without overt  signs of bleeding Thrombocytopenia   - Trend CBC - Will check coags  - Monitor for s/sx of bleeding and transfuse for hgb <7 - Continue IV PPI  - [place] Right Lower Ext pending to r/o pseudoaneurysm   - GI consulted appreciate input  Acute Metabolic Encephalopathy due to above   CT Head 10/10: negative for acute intracranial abnormality - Provide supportive care   Hypoglycemic Episodes - CBGs - Follow ICU hyper/hypoglycemia protocol  Best practice:  Diet:  NPO Pain/Anxiety/Delirium protocol (if indicated): No VAP protocol (if indicated): Not indicated DVT prophylaxis: SCD's GI prophylaxis: N/A Glucose control:  SSI Yes Central venous access: Right upper arm PICC line  Arterial line:  Yes, and it is still needed Foley: N/A Mobility:  bed rest  PT consulted: N/A Last date of multidisciplinary goals of care discussion [N/A] Code Status: DNR Disposition: Stepdown  Critical care time: 40 minutes       Donell Beers, [place] Pager [phone] (please enter 7 digits) PCCM Consult Pager [phone] (please enter 7 digits)

## 2022-07-03 NOTE — Progress Notes (Signed)
       CROSS COVER NOTE  NAME: Kathleen Sharp MRN: [id number] DOB : 11/23/1945 ATTENDING PHYSICIAN: @ATTENDING @    Date of Service   07/03/2022   HPI/Events of Note   Vision changes, change in mentation  Interventions   Assessment/Plan:  CT Head.... X      This document was prepared using Dragon voice recognition software and may include unintentional dictation errors.  Neomia Glass DNP, MBA, FNP-BC Nurse Practitioner [facility] Pager [phone]

## 2022-07-03 NOTE — Progress Notes (Signed)
[facility]  ROUNDING NOTE   Subjective:   Kathleen Sharp is a 76 year old female with past medical conditions including hyperlipidemia, hypertension, cardiomegaly, and end-stage renal disease on hemodialysis.  Patient presents to the emergency department with complaints of shortness of breath and has been admitted for Hyperkalemia [E87.5] SOB (shortness of breath) [R06.02] Tachyarrhythmia [R00.0] Volume overload [E87.70]  Patient is known to our practice and receives outpatient dialysis treatments at [facility] on a MWF schedule, supervised by Dr. Candiss Norse.    Patient was seen in the ICU.  Patient offers no new physical complaint. Patient Informed me that she was feeling better than before    Objective:  Vital signs in last 24 hours:  Temp:  [97.9 F (36.6 C)-98.4 F (36.9 C)] 98.1 F (36.7 C) (10/17 2100) Pulse Rate:  [67-122] 67 (10/18 0515) Resp:  [13-34] 34 (10/18 0645) BP: (70-130)/(40-98) 94/61 (10/18 0700) SpO2:  [78 %-100 %] 96 % (10/18 0530) Arterial Line BP: (96-120)/(47-98) 96/59 (10/17 1215)  Weight change:  Filed Weights   06/26/22 0604 06/26/22 1342 06/29/22 0415  Weight: 81.6 kg 83.6 kg 85.5 kg    Intake/Output: I/O last 3 completed shifts: In: 320 [Blood:320] Out: 0    Intake/Output this shift:  No intake/output data recorded.  Physical Exam: General: NAD  Head: Normocephalic, atraumatic. Moist oral mucosal membranes  Eyes: Anicteric  Lungs:  Diminished in bases, normal effort, room air  Heart: Regular rate and rhythm  Abdomen:  Soft, nontender, obese  Extremities: Trace peripheral edema.  Neurologic: Nonfocal, moving all four extremities  Skin: No lesions  Access: Left AVG    Basic Metabolic Panel: Recent Labs  Lab 06/29/22 0428 06/30/22 0555 06/25/2022 0403 07/02/22 0445 07/03/22 0353  NA 130* 132* 132* 135 133*  K 3.9 4.0 4.3 3.6 4.1  CL 92* 94* 94* 98 98  CO2 $Re'26 27 28 29 27  'LMu$ GLUCOSE 136* 110* 100* 100*  103*  BUN 49* 30* 41* 33* 96*  CREATININE 7.35* 5.87* 7.37* 4.69* 6.08*  CALCIUM 8.6* 8.5* 8.5* 8.3* 8.1*  MG  --  1.6* 2.5* 1.9 1.9  PHOS  --   --  5.2* 3.5 2.7    Liver Function Tests: Recent Labs  Lab 06/27/22 1045 06/28/22 0635 06/30/22 0555  AST 169* 85* 29  ALT 342* 244* 124*  ALKPHOS 69 61 52  BILITOT 0.8 0.4 0.4  PROT 6.6 6.0* 6.4*  ALBUMIN 3.5 3.2* 3.7   No results for input(s): "LIPASE", "AMYLASE" in the last 168 hours. No results for input(s): "AMMONIA" in the last 168 hours.  CBC: Recent Labs  Lab 06/29/22 0428 06/30/22 0555 07/10/2022 0403 07/02/22 0445 07/02/22 1603 07/03/22 0353  WBC 13.6* 9.9 10.7* 11.0*  --  18.5*  NEUTROABS  --   --  7.9* 8.0*  --  12.2*  HGB 9.5* 8.6* 8.1* 7.3* 7.7* 6.3*  HCT 29.4* 27.9* 25.8* 23.9* 24.6* 19.9*  MCV 78.6* 80.6 81.6 83.3  --  81.6  PLT 202 176 162 147*  --  141*    Cardiac Enzymes: No results for input(s): "CKTOTAL", "CKMB", "CKMBINDEX", "TROPONINI" in the last 168 hours.  BNP: Invalid input(s): "POCBNP"  CBG: Recent Labs  Lab 07/02/22 1614 07/02/22 1954 07/03/22 0014 07/03/22 0344 07/03/22 0733  GLUCAP 106* 94 82 81 20*    Microbiology: Results for orders placed or performed during the hospital encounter of 06/27/2022  Resp Panel by RT-PCR (Flu A&B, Covid) Anterior Nasal Swab     Status:  None   Collection Time: 07/12/2022  5:13 PM   Specimen: Anterior Nasal Swab  Result Value Ref Range Status   SARS Coronavirus 2 by RT PCR NEGATIVE NEGATIVE Final    Comment: (NOTE) SARS-CoV-2 target nucleic acids are NOT DETECTED.  The SARS-CoV-2 RNA is generally detectable in upper respiratory specimens during the acute phase of infection. The lowest concentration of SARS-CoV-2 viral copies this assay can detect is 138 copies/mL. A negative result does not preclude SARS-Cov-2 infection and should not be used as the sole basis for treatment or other patient management decisions. A negative result may occur with   improper specimen collection/handling, submission of specimen other than nasopharyngeal swab, presence of viral mutation(s) within the areas targeted by this assay, and inadequate number of viral copies(<138 copies/mL). A negative result must be combined with clinical observations, patient history, and epidemiological information. The expected result is Negative.  Fact Sheet for Patients:  EntrepreneurPulse.com.au  Fact Sheet for Healthcare Providers:  IncredibleEmployment.be  This test is no t yet approved or cleared by the [place] FDA and  has been authorized for detection and/or diagnosis of SARS-CoV-2 by FDA under an Emergency Use Authorization (EUA). This EUA will remain  in effect (meaning this test can be used) for the duration of the COVID-19 declaration under Section 564(b)(1) of the Act, 21 U.S.C.section 360bbb-3(b)(1), unless the authorization is terminated  or revoked sooner.       Influenza A by PCR NEGATIVE NEGATIVE Final   Influenza B by PCR NEGATIVE NEGATIVE Final    Comment: (NOTE) The Xpert Xpress SARS-CoV-2/FLU/RSV plus assay is intended as an aid in the diagnosis of influenza from Nasopharyngeal swab specimens and should not be used as a sole basis for treatment. Nasal washings and aspirates are unacceptable for Xpert Xpress SARS-CoV-2/FLU/RSV testing.  Fact Sheet for Patients: EntrepreneurPulse.com.au  Fact Sheet for Healthcare Providers: IncredibleEmployment.be  This test is not yet approved or cleared by the [place] FDA and has been authorized for detection and/or diagnosis of SARS-CoV-2 by FDA under an Emergency Use Authorization (EUA). This EUA will remain in effect (meaning this test can be used) for the duration of the COVID-19 declaration under Section 564(b)(1) of the Act, 21 U.S.C. section 360bbb-3(b)(1), unless the authorization is terminated  or revoked.  Performed at [facility], [address] [place], [place] [address]   MRSA Next Gen by PCR, Nasal     Status: None   Collection Time: 06/24/22  4:49 PM   Specimen: Nasal Mucosa; Nasal Swab  Result Value Ref Range Status   MRSA by PCR Next Gen NOT DETECTED NOT DETECTED Final    Comment: (NOTE) The GeneXpert MRSA Assay (FDA approved for NASAL specimens only), is one component of a comprehensive MRSA colonization surveillance program. It is not intended to diagnose MRSA infection nor to guide or monitor treatment for MRSA infections. Test performance is not FDA approved in patients less than 3 years old. Performed at [facility], [address] [place], [place] [address]     Coagulation Studies: No results for input(s): "LABPROT", "INR" in the last 72 hours.   Urinalysis: No results for input(s): "COLORURINE", "LABSPEC", "PHURINE", "GLUCOSEU", "HGBUR", "BILIRUBINUR", "KETONESUR", "PROTEINUR", "UROBILINOGEN", "NITRITE", "LEUKOCYTESUR" in the last 72 hours.  Invalid input(s): "APPERANCEUR"    Imaging: CARDIAC CATHETERIZATION  Result Date: 07/07/2022   Prox RCA lesion is 30% stenosed.   Dist Cx lesion is 30% stenosed.   Dist LAD lesion is 30% stenosed.   The  left ventricular systolic function is normal.   The left ventricular ejection fraction is 50-55% by visual estimate.     Medications:    sodium chloride      sodium chloride   Intravenous Once   apixaban  5 mg Oral BID   atorvastatin  20 mg Oral q1800   atropine  0.5 mg Intravenous Once   Chlorhexidine Gluconate Cloth  6 each Topical Q0600   clopidogrel  75 mg Oral Daily   sevelamer carbonate  1,600 mg Oral TID WC   sodium chloride flush  10-40 mL Intracatheter Q12H   sodium chloride flush  3 mL Intravenous Q12H   sodium chloride flush  3 mL Intravenous Q12H   sodium chloride flush  3 mL Intravenous Q12H   sodium chloride, acetaminophen, albuterol, guaiFENesin, hydrALAZINE,  ondansetron (ZOFRAN) IV, senna-docusate, sodium chloride flush, sodium chloride flush     Assessment/ Plan:  Ms. Kathleen Sharp is a 76 y.o.  female with past medical conditions including hyperlipidemia, hypertension, cardiomegaly, and end-stage renal disease on hemodialysis.  Patient presents to the emergency department with complaints of shortness of breath and has been admitted for Hyperkalemia [E87.5] SOB (shortness of breath) [R06.02] Tachyarrhythmia [R00.0] Volume overload [E87.70]  [facility]/M/F/left AVG/82 kg  Volume overload/hyperkalemia with end-stage renal disease on hemodialysis.  Potassium on ED arrival 5.9, increased to 6.2 after pharmacologic treatment. Patient hyperkalemia is now better          Patient is on Monday and Friday schedule as an outpatient.         Patient has had volume overload issues.  We will dialyze patient today     2. Anemia of chronic kidney disease Lab Results  Component Value Date   HGB 6.3 (L) 07/03/2022    Hemoglobin is not at goal We will follow with primary team/ICU team regarding need for PRBC. I also discussed with the primary team regarding CTA to look for source of bleeding.  Patient did undergo CTA but no clear source of bleeding identified Patient is to receive PRBC today  3. Secondary Hyperparathyroidism: with outpatient labs: PTH 798, phosphorus 8.5, calcium 8.0 on 04/29/22.   Lab Results  Component Value Date   CALCIUM 8.1 (L) 07/03/2022   PHOS 2.7 07/03/2022     Continue sevelamer ordered with meals.  4.  Hypotensive  patient blood pressure is on the lower side Cardiology is following closely  5.  NSTEMI with elevated troponin and ST depression with SVT on EKG.       Patient was earlier on IV heparin Patient is being closely followed by primary team as well as cardiology Patient had  cardiac catheterization - its showed minor luminal irregularities about 30% in LAD circumflex and RCA with no significant  coronary artery disease     LOS: 9 Harout Scheurich s [facility] 10/18/20238:09 AM

## 2022-07-03 NOTE — Progress Notes (Addendum)
0800: Pt had a large, tarry bowel movement. Amid, MD notified.   0923: CT abdomin ordered.  0945: Pt vomited dark brown emesis after having one bite of Marilu Favre, MD notified.  1130: Pt acutely confused. Alert and oriented to self and place only. Removing clothing and leads, and attempting to stand up despite safety instructions being provided to remain in the bed. Pt appears short of breath with an expiratory wheeze. Reesa Chew, MD notified; critical care team notified.

## 2022-07-03 NOTE — Progress Notes (Signed)
SUBJECTIVE: Patient is a 76 year old female with a history of ESRD on dialysis MWF, hypertension, cardiomegaly, hyperlipidemia, who presented to the ED on 06/22/2022 with shortness of breath, palpitations. Denied chest pain at admission.     On 10/10, patient had an episode of nonsustained ventricular tachycardia followed by atrial fibrillation associated with dizziness. Patient denies losing consciousness. IV amiodoarone started.    Cardiac cath rescheduled for 10/12, however, patient developed mental status changes, cath was cancelled.    On 10/15 overnight patient became unresponsive, bradycardic, hypotensive. IV amiodarone discontinued. Patient transferred to ICU.    Cardiac cath 06/24/2022 revealed minor luminal irregularities about 30% in LAD circumflex and RCA with no significant coronary artery disease.  LVEF on echo was 55% thus LV gram was deferred  Acute anemia- unclear etiology, Hgb now 6.3. Patient received one unit PRBC. Patient having CT abdomen pelvis this morning.    Vitals:   07/03/22 0645 07/03/22 0700 07/03/22 0715 07/03/22 0745  BP: (!) 87/56 94/61 (!) 115/59 (!) 99/54  Pulse:      Resp: (!) 34  20 (!) 31  Temp:      TempSrc:      SpO2:      Weight:      Height:        Intake/Output Summary (Last 24 hours) at 07/03/2022 1045 Last data filed at 07/02/2022 1300 Gross per 24 hour  Intake 320 ml  Output --  Net 320 ml    LABS: Basic Metabolic Panel: Recent Labs    07/02/22 0445 07/03/22 0353  NA 135 133*  K 3.6 4.1  CL 98 98  CO2 29 27  GLUCOSE 100* 103*  BUN 33* 96*  CREATININE 4.69* 6.08*  CALCIUM 8.3* 8.1*  MG 1.9 1.9  PHOS 3.5 2.7   Liver Function Tests: No results for input(s): "AST", "ALT", "ALKPHOS", "BILITOT", "PROT", "ALBUMIN" in the last 72 hours. No results for input(s): "LIPASE", "AMYLASE" in the last 72 hours. CBC: Recent Labs    07/02/22 0445 07/02/22 1603 07/03/22 0353  WBC 11.0*  --  18.5*  NEUTROABS 8.0*  --  12.2*  HGB 7.3*  7.7* 6.3*  HCT 23.9* 24.6* 19.9*  MCV 83.3  --  81.6  PLT 147*  --  141*   Cardiac Enzymes: No results for input(s): "CKTOTAL", "CKMB", "CKMBINDEX", "TROPONINI" in the last 72 hours. BNP: Invalid input(s): "POCBNP" D-Dimer: No results for input(s): "DDIMER" in the last 72 hours. Hemoglobin A1C: No results for input(s): "HGBA1C" in the last 72 hours. Fasting Lipid Panel: No results for input(s): "CHOL", "HDL", "LDLCALC", "TRIG", "CHOLHDL", "LDLDIRECT" in the last 72 hours. Thyroid Function Tests: No results for input(s): "TSH", "T4TOTAL", "T3FREE", "THYROIDAB" in the last 72 hours.  Invalid input(s): "FREET3" Anemia Panel: No results for input(s): "VITAMINB12", "FOLATE", "FERRITIN", "TIBC", "IRON", "RETICCTPCT" in the last 72 hours.   PHYSICAL EXAM General: Well developed, well nourished, in no acute distress HEENT:  Normocephalic and atramatic Neck:  No JVD.  Lungs: Clear bilaterally to auscultation and percussion. Heart: HRRR . Normal S1 and S2 without gallops or murmurs.  Abdomen: Bowel sounds are positive, abdomen soft and non-tender  Msk:  Back normal, normal gait. Normal strength and tone for age. Extremities: No clubbing, cyanosis or edema.   Neuro: Alert and oriented X 3. Psych:  Good affect, responds appropriately  TELEMETRY: sinus rhythm  ASSESSMENT AND PLAN: Patient resting comfortably in bed. Denies chest pain. Shortness of breath improved. Patient being transported to CT. Eliquis currently  on hold due to acute anemia of unknown etiology. Eliquis should be restarted at 2.5 mg twice daily when appropriate.   Principal Problem:   NSTEMI (non-ST elevated myocardial infarction) ([place]) Active Problems:   Anemia of chronic renal failure, stage 5 (HCC)   End stage renal disease (HCC)   Essential hypertension   Volume overload   Hyperkalemia   Pulmonary edema   Cardiac arrhythmia   Syncope and collapse   Metabolic encephalopathy   Anemia, posthemorrhagic, acute     [name], FNP-C 07/03/2022 10:45 AM

## 2022-07-03 NOTE — Progress Notes (Signed)
Kathleen Sharp , MD [address] [place], [place], [place], [address] [address], [place], [place], [place], [address] Phone: [phone]  Fax: [phone]   Kathleen Sharp is being followed for anemia  Day 1 of follow up   Subjective: Overnight no hematemesis or melena, small bowel movement . Discussed with nurses. SBP in 80's    Objective: Vital signs in last 24 hours: Vitals:   07/03/22 0100 07/03/22 0200 07/03/22 0300 07/03/22 0449  BP:      Pulse:  76 76   Resp: (!) 30 13 (!) 31   Temp:      TempSrc:      SpO2:  98% 98% 100%  Weight:      Height:       Weight change:   Intake/Output Summary (Last 24 hours) at 07/03/2022 0618 Last data filed at 07/02/2022 1300 Gross per 24 hour  Intake 320 ml  Output --  Net 320 ml     Exam: Heart:: Regular rate and rhythm, S1S2 present, or without murmur or extra heart sounds Lungs: normal Abdomen: soft, nontender, normal bowel sounds   Lab Results: $RemoveBefo'@LABTEST2'mmRNLAwOgtj$ @ Micro Results: Recent Results (from the past 240 hour(s))  Resp Panel by RT-PCR (Flu A&B, Covid) Anterior Nasal Swab     Status: None   Collection Time: 06/17/2022  5:13 PM   Specimen: Anterior Nasal Swab  Result Value Ref Range Status   SARS Coronavirus 2 by RT PCR NEGATIVE NEGATIVE Final    Comment: (NOTE) SARS-CoV-2 target nucleic acids are NOT DETECTED.  The SARS-CoV-2 RNA is generally detectable in upper respiratory specimens during the acute phase of infection. The lowest concentration of SARS-CoV-2 viral copies this assay can detect is 138 copies/mL. A negative result does not preclude SARS-Cov-2 infection and should not be used as the sole basis for treatment or other patient management decisions. A negative result may occur with  improper specimen collection/handling, submission of specimen other than nasopharyngeal swab, presence of viral mutation(s) within the areas targeted by this assay, and inadequate number of viral copies(<138  copies/mL). A negative result must be combined with clinical observations, patient history, and epidemiological information. The expected result is Negative.  Fact Sheet for Patients:  EntrepreneurPulse.com.au  Fact Sheet for Healthcare Providers:  IncredibleEmployment.be  This test is no t yet approved or cleared by the [place] FDA and  has been authorized for detection and/or diagnosis of SARS-CoV-2 by FDA under an Emergency Use Authorization (EUA). This EUA will remain  in effect (meaning this test can be used) for the duration of the COVID-19 declaration under Section 564(b)(1) of the Act, 21 U.S.C.section 360bbb-3(b)(1), unless the authorization is terminated  or revoked sooner.       Influenza A by PCR NEGATIVE NEGATIVE Final   Influenza B by PCR NEGATIVE NEGATIVE Final    Comment: (NOTE) The Xpert Xpress SARS-CoV-2/FLU/RSV plus assay is intended as an aid in the diagnosis of influenza from Nasopharyngeal swab specimens and should not be used as a sole basis for treatment. Nasal washings and aspirates are unacceptable for Xpert Xpress SARS-CoV-2/FLU/RSV testing.  Fact Sheet for Patients: EntrepreneurPulse.com.au  Fact Sheet for Healthcare Providers: IncredibleEmployment.be  This test is not yet approved or cleared by the [place] FDA and has been authorized for detection and/or diagnosis of SARS-CoV-2 by FDA under an Emergency Use Authorization (EUA). This EUA will remain in effect (meaning this test can be used) for the duration of the COVID-19 declaration under Section  564(b)(1) of the Act, 21 U.S.C. section 360bbb-3(b)(1), unless the authorization is terminated or revoked.  Performed at [facility], [address] [place], [place] [address]   MRSA Next Gen by PCR, Nasal     Status: None   Collection Time: 06/24/22  4:49 PM   Specimen: Nasal Mucosa; Nasal Swab  Result  Value Ref Range Status   MRSA by PCR Next Gen NOT DETECTED NOT DETECTED Final    Comment: (NOTE) The GeneXpert MRSA Assay (FDA approved for NASAL specimens only), is one component of a comprehensive MRSA colonization surveillance program. It is not intended to diagnose MRSA infection nor to guide or monitor treatment for MRSA infections. Test performance is not FDA approved in patients less than 28 years old. Performed at [facility], [address] [place], [place] [address]    Studies/Results: CARDIAC CATHETERIZATION  Result Date: 06/24/2022   Prox RCA lesion is 30% stenosed.   Dist Cx lesion is 30% stenosed.   Dist LAD lesion is 30% stenosed.   The left ventricular systolic function is normal.   The left ventricular ejection fraction is 50-55% by visual estimate.   Medications: I have reviewed the patient's current medications. Scheduled Meds:  sodium chloride   Intravenous Once   sodium chloride   Intravenous Once   apixaban  5 mg Oral BID   atorvastatin  20 mg Oral q1800   atropine  0.5 mg Intravenous Once   Chlorhexidine Gluconate Cloth  6 each Topical Q0600   clopidogrel  75 mg Oral Daily   sevelamer carbonate  1,600 mg Oral TID WC   sodium chloride flush  10-40 mL Intracatheter Q12H   sodium chloride flush  3 mL Intravenous Q12H   sodium chloride flush  3 mL Intravenous Q12H   sodium chloride flush  3 mL Intravenous Q12H   Continuous Infusions:  sodium chloride     PRN Meds:.sodium chloride, acetaminophen, albuterol, guaiFENesin, hydrALAZINE, ondansetron (ZOFRAN) IV, senna-docusate, sodium chloride flush, sodium chloride flush    Latest Ref Rng & Units 07/03/2022    3:53 AM 07/02/2022    4:03 PM 07/02/2022    4:45 AM  CBC  WBC 4.0 - 10.5 K/uL 18.5   11.0   Hemoglobin 12.0 - 15.0 g/dL 6.3  7.7  7.3   Hematocrit 36.0 - 46.0 % 19.9  24.6  23.9   Platelets 150 - 400 K/uL 141   147       Latest Ref Rng & Units 07/03/2022    3:53 AM 07/02/2022    4:45  AM 06/22/2022    4:03 AM  BMP  Glucose 70 - 99 mg/dL 103  100  100   BUN 8 - 23 mg/dL 96  33  41   Creatinine 0.44 - 1.00 mg/dL 6.08  4.69  7.37   Sodium 135 - 145 mmol/L 133  135  132   Potassium 3.5 - 5.1 mmol/L 4.1  3.6  4.3   Chloride 98 - 111 mmol/L 98  98  94   CO2 22 - 32 mmol/L $RemoveB'27  29  28   'UfjJysGC$ Calcium 8.9 - 10.3 mg/dL 8.1  8.3  8.5      Assessment: Principal Problem:   NSTEMI (non-ST elevated myocardial infarction) ([place]) Active Problems:   Anemia of chronic renal failure, stage 5 (HCC)   End stage renal disease (HCC)   Essential hypertension   Volume overload   Hyperkalemia   Pulmonary edema   Cardiac arrhythmia  Syncope and collapse   Metabolic encephalopathy  REAGAN BEHLKE is a 76 y.o. y/o female with altered comorbidities admitted with shortness of breath, pulmonary edema, hyperkalemia, NSTEMI, arrhythmias multiple episodes of syncope unresponsiveness low blood pressure patient .  I have been consulted for anemia.  Gradual drop in hemoglobin with no overt blood loss manifested either by hematemesis or melena.  The patient is on Eliquis and Plavix.  Overnight further drop in HB, no overt bloos losses .    Plan 1.  From the GI point of view unless the patient has had no hematemesis or melena or hematochezia.   At this point of time with the patient having a borderline blood pressure [systolic blood pressure in the 39J systolic] multiple episodes of an arrhythmia, pulmonary edema, medical instability the risks of endoscopy procedure with anesthesia outweighs any benefits as we do not have any clear evidence of a GI bleed.  Would recommend other forms of work-up to rule out blood loss either at the cardiac catheterization site versus intra-abdominal bleed. There is no role for stool occult blood testing as a positive test does not indicate an active bleed nor  a negative test rule out an active bleed.  It is a test only indicated for colon cancer screening as an  outpatient    Watch the color of the stool.  If there is a concern for gastrointestinal bleed manifested either by an episode of hematemesis melena or hematochezia and obtain either CT angiogram or a tagged RBC scan.     2.  IV PPI     LOS: 9 days   Kathleen Bellows, MD 07/03/2022, 6:18 AM

## 2022-07-03 NOTE — Progress Notes (Signed)
   07/03/22 1900  Vitals  Temp (!) 97.5 F (36.4 C)  Temp Source Oral  BP (!) 81/51  MAP (mmHg) (!) 60  BP Location Right Arm  BP Method Automatic  Patient Position (if appropriate) Lying  Pulse Rate 76  Pulse Rate Source Monitor  ECG Heart Rate 77  Resp (!) 21  Oxygen Therapy  SpO2 97 %  O2 Device Nasal Cannula  O2 Flow Rate (L/min) 2 L/min  Patient Activity (if Appropriate) In bed  Pulse Oximetry Type Continuous  During Treatment Monitoring  Blood Flow Rate (mL/min) 200 mL/min  HD Safety Checks Performed Yes  Intra-Hemodialysis Comments Tx completed  Post Treatment  Dialyzer Clearance Lightly streaked  Duration of HD Treatment -hour(s) 3.25 hour(s)  Hemodialysis Intake (mL) 100 mL  Liters Processed 78  Fluid Removed 400 mL  Tolerated HD Treatment Yes  Post-Hemodialysis Comments tx completed. no complications.  AVG/AVF Arterial Site Held (minutes) 10 minutes  AVG/AVF Venous Site Held (minutes) 10 minutes  Fistula / Graft Left Upper arm  No placement date or time found.   Placed prior to admission: Yes  Orientation: Left  Access Location: Upper arm  Site Condition No complications  Fistula / Graft Assessment Present;Thrill;Bruit  Status Deaccessed  Needle Size 15  Drainage Description None

## 2022-07-03 NOTE — Progress Notes (Signed)
PROGRESS NOTE    Kathleen Sharp  PNT:[id number] DOB: [birth year] DOA: 06/17/2022 PCP: Adalberto Ill, MD   Brief Narrative:  76 year old with history of ESRD on HD MWF, HTN, cardiomegaly, HLD presented to the ED with shortness of breath.  COVID/influenza were negative, troponins of 89, lactic acidosis.  Overnight had developed SVT and troponin peaked greater than 14,000.  Patient was initially started on heparin drip and later refused cardiac catheterization.  Hospital course was also complicated by syncopal episode.  CTA of the chest and CT of head and neck were negative for acute pathology.  Echocardiogram showed normal EF.  Due to arrhythmia amiodarone was planned by cardiology team.  Eventually agreed to cardiac cath which showed nonobstructive CAD.   Assessment & Plan:  Principal Problem:   NSTEMI (non-ST elevated myocardial infarction) ([place]) Active Problems:   Cardiac arrhythmia   Syncope and collapse   Pulmonary edema   Volume overload   Metabolic encephalopathy   Hyperkalemia   End stage renal disease (HCC)   Essential hypertension   Anemia of chronic renal failure, stage 5 (HCC)   Anemia, posthemorrhagic, acute    NSTEMI (non-ST elevated myocardial infarction) (HCC) -Troponin peaked greater than 14K.  Initially started on heparin drip and patient refused left heart catheterization which was eventually performed on 10/16 as patient was agreeable which revealed nonobstructive CAD.  Echo showed EF of 55%.  Cardiology recommended Plavix and Eliquis but currently will be on hold   Acute anemia with hematemesis - Hemoglobin has drifted down.  CTA of the abdomen pelvis which is negative for any active bleed but shows focal outpouching of the right common femoral artery suspecting hematoma versus pseudoaneurysm.  Seen and discussed with GI, not stable for any urgent endoscopic evaluation at this time.  Continue to hold Eliquis and Plavix  Right groin hematoma - We will order  ultrasound to rule out pseudoaneurysm  Cardiac arrhythmia Patient is experiencing transient, self-limiting cardiac arrhythmias where she became symptomatic.  Had 2 syncopal episode during current hospitalization.  Most likely secondary to recent NSTEMI. Cardiology started her on amiodarone infusion, which was discontinued due to symptomatic bradycardia overnight requiring atropine. -Continue with telemetry monitoring   Syncope and collapse Delirium/intermittent altered mental status. Initial peripheral blood pressures were showing low but after A-line placement they were normal therefore did not require any pressors. CT head was negative for any acute infarct.  Chronic lacunar infarcts noted. CTA head and neck was negative for any large vessel occlusion.  Some stenosis noted as mentioned in full report. -Continue to monitor    Metabolic encephalopathy Off and on.     Volume overload - Mainly managed with hemodialysis   Hyperkalemia Risk called, continue to monitor   End stage renal disease ([place]) Has a left upper extremity AV fistula.  Nephrology following.   Essential hypertension - Hold antihypertensives at this time.  IV as needed ordered.  Unable to get good blood pressure  Given the acuity of her condition, we will discuss her case with critical care  DVT prophylaxis: Place TED hose Start: 07/06/2022 1838 Code Status:  Family Communication:  No answer. [place]  Patient is critically ill with ongoing issue of GI bleed.    Subjective: Feels ok, was uncomfortable in bed.  Later in the morning had Hematemesis.   Examination:  General exam: Appears chronically ill Respiratory system: Clear to auscultation. Respiratory effort normal. Cardiovascular system: S1 & S2 heard, RRR. No JVD, murmurs, rubs, gallops or  clicks. No pedal edema. Gastrointestinal system: Abdomen is nondistended, soft and nontender. No organomegaly or masses felt. Normal bowel sounds  heard. Central nervous system: Alert and oriented to name only.  No focal neurodeficits Extremities: Symmetric 5 x 5 power. Skin: No rashes, lesions or ulcers Psychiatry: Judgement and insight appear poor  Objective: Vitals:   07/03/22 0645 07/03/22 0700 07/03/22 0715 07/03/22 0745  BP: (!) 87/56 94/61 (!) 115/59 (!) 99/54  Pulse:      Resp: (!) 34  20 (!) 31  Temp:      TempSrc:      SpO2:      Weight:      Height:        Intake/Output Summary (Last 24 hours) at 07/03/2022 0900 Last data filed at 07/02/2022 1300 Gross per 24 hour  Intake 320 ml  Output --  Net 320 ml   Filed Weights   06/26/22 0604 06/26/22 1342 06/29/22 0415  Weight: 81.6 kg 83.6 kg 85.5 kg     Data Reviewed:   CBC: Recent Labs  Lab 06/29/22 0428 06/30/22 0555 06/25/2022 0403 07/02/22 0445 07/02/22 1603 07/03/22 0353  WBC 13.6* 9.9 10.7* 11.0*  --  18.5*  NEUTROABS  --   --  7.9* 8.0*  --  12.2*  HGB 9.5* 8.6* 8.1* 7.3* 7.7* 6.3*  HCT 29.4* 27.9* 25.8* 23.9* 24.6* 19.9*  MCV 78.6* 80.6 81.6 83.3  --  81.6  PLT 202 176 162 147*  --  240*   Basic Metabolic Panel: Recent Labs  Lab 06/29/22 0428 06/30/22 0555 06/20/2022 0403 07/02/22 0445 07/03/22 0353  NA 130* 132* 132* 135 133*  K 3.9 4.0 4.3 3.6 4.1  CL 92* 94* 94* 98 98  CO2 $Re'26 27 28 29 27  'kLT$ GLUCOSE 136* 110* 100* 100* 103*  BUN 49* 30* 41* 33* 96*  CREATININE 7.35* 5.87* 7.37* 4.69* 6.08*  CALCIUM 8.6* 8.5* 8.5* 8.3* 8.1*  MG  --  1.6* 2.5* 1.9 1.9  PHOS  --   --  5.2* 3.5 2.7   GFR: Estimated Creatinine Clearance: 8.2 mL/min (A) (by C-G formula based on SCr of 6.08 mg/dL (H)). Liver Function Tests: Recent Labs  Lab 06/27/22 1045 06/28/22 0635 06/30/22 0555  AST 169* 85* 29  ALT 342* 244* 124*  ALKPHOS 69 61 52  BILITOT 0.8 0.4 0.4  PROT 6.6 6.0* 6.4*  ALBUMIN 3.5 3.2* 3.7   No results for input(s): "LIPASE", "AMYLASE" in the last 168 hours. No results for input(s): "AMMONIA" in the last 168 hours. Coagulation  Profile: No results for input(s): "INR", "PROTIME" in the last 168 hours. Cardiac Enzymes: No results for input(s): "CKTOTAL", "CKMB", "CKMBINDEX", "TROPONINI" in the last 168 hours. BNP (last 3 results) No results for input(s): "PROBNP" in the last 8760 hours. HbA1C: No results for input(s): "HGBA1C" in the last 72 hours. CBG: Recent Labs  Lab 07/02/22 1954 07/03/22 0014 07/03/22 0344 07/03/22 0733 07/03/22 0817  GLUCAP 94 79 81 64* 53*   Lipid Profile: No results for input(s): "CHOL", "HDL", "LDLCALC", "TRIG", "CHOLHDL", "LDLDIRECT" in the last 72 hours. Thyroid Function Tests: No results for input(s): "TSH", "T4TOTAL", "FREET4", "T3FREE", "THYROIDAB" in the last 72 hours. Anemia Panel: No results for input(s): "VITAMINB12", "FOLATE", "FERRITIN", "TIBC", "IRON", "RETICCTPCT" in the last 72 hours. Sepsis Labs: No results for input(s): "PROCALCITON", "LATICACIDVEN" in the last 168 hours.  Recent Results (from the past 240 hour(s))  Resp Panel by RT-PCR (Flu A&B, Covid) Anterior Nasal Swab  Status: None   Collection Time: 06/21/2022  5:13 PM   Specimen: Anterior Nasal Swab  Result Value Ref Range Status   SARS Coronavirus 2 by RT PCR NEGATIVE NEGATIVE Final    Comment: (NOTE) SARS-CoV-2 target nucleic acids are NOT DETECTED.  The SARS-CoV-2 RNA is generally detectable in upper respiratory specimens during the acute phase of infection. The lowest concentration of SARS-CoV-2 viral copies this assay can detect is 138 copies/mL. A negative result does not preclude SARS-Cov-2 infection and should not be used as the sole basis for treatment or other patient management decisions. A negative result may occur with  improper specimen collection/handling, submission of specimen other than nasopharyngeal swab, presence of viral mutation(s) within the areas targeted by this assay, and inadequate number of viral copies(<138 copies/mL). A negative result must be combined with clinical  observations, patient history, and epidemiological information. The expected result is Negative.  Fact Sheet for Patients:  EntrepreneurPulse.com.au  Fact Sheet for Healthcare Providers:  IncredibleEmployment.be  This test is no t yet approved or cleared by the [place] FDA and  has been authorized for detection and/or diagnosis of SARS-CoV-2 by FDA under an Emergency Use Authorization (EUA). This EUA will remain  in effect (meaning this test can be used) for the duration of the COVID-19 declaration under Section 564(b)(1) of the Act, 21 U.S.C.section 360bbb-3(b)(1), unless the authorization is terminated  or revoked sooner.       Influenza A by PCR NEGATIVE NEGATIVE Final   Influenza B by PCR NEGATIVE NEGATIVE Final    Comment: (NOTE) The Xpert Xpress SARS-CoV-2/FLU/RSV plus assay is intended as an aid in the diagnosis of influenza from Nasopharyngeal swab specimens and should not be used as a sole basis for treatment. Nasal washings and aspirates are unacceptable for Xpert Xpress SARS-CoV-2/FLU/RSV testing.  Fact Sheet for Patients: EntrepreneurPulse.com.au  Fact Sheet for Healthcare Providers: IncredibleEmployment.be  This test is not yet approved or cleared by the [place] FDA and has been authorized for detection and/or diagnosis of SARS-CoV-2 by FDA under an Emergency Use Authorization (EUA). This EUA will remain in effect (meaning this test can be used) for the duration of the COVID-19 declaration under Section 564(b)(1) of the Act, 21 U.S.C. section 360bbb-3(b)(1), unless the authorization is terminated or revoked.  Performed at [facility], [address] [place], [place] [address]   MRSA Next Gen by PCR, Nasal     Status: None   Collection Time: 06/24/22  4:49 PM   Specimen: Nasal Mucosa; Nasal Swab  Result Value Ref Range Status   MRSA by PCR Next Gen NOT DETECTED  NOT DETECTED Final    Comment: (NOTE) The GeneXpert MRSA Assay (FDA approved for NASAL specimens only), is one component of a comprehensive MRSA colonization surveillance program. It is not intended to diagnose MRSA infection nor to guide or monitor treatment for MRSA infections. Test performance is not FDA approved in patients less than 58 years old. Performed at [facility], [address], [place], [place] [address]          Radiology Studies: CARDIAC CATHETERIZATION  Result Date: 07/04/2022   Prox RCA lesion is 30% stenosed.   Dist Cx lesion is 30% stenosed.   Dist LAD lesion is 30% stenosed.   The left ventricular systolic function is normal.   The left ventricular ejection fraction is 50-55% by visual estimate.        Scheduled Meds:  sodium chloride   Intravenous Once  apixaban  5 mg Oral BID   atorvastatin  20 mg Oral q1800   atropine  0.5 mg Intravenous Once   Chlorhexidine Gluconate Cloth  6 each Topical Q0600   clopidogrel  75 mg Oral Daily   sevelamer carbonate  1,600 mg Oral TID WC   sodium chloride flush  10-40 mL Intracatheter Q12H   sodium chloride flush  3 mL Intravenous Q12H   sodium chloride flush  3 mL Intravenous Q12H   sodium chloride flush  3 mL Intravenous Q12H   Continuous Infusions:  sodium chloride       LOS: 9 days   Time spent= 35 mins    Ahmaud Duthie Arsenio Loader, MD [facility]  If 7PM-7AM, please contact night-coverage  07/03/2022, 9:00 AM

## 2022-07-03 NOTE — Progress Notes (Signed)
       CROSS COVER NOTE  NAME: Kathleen Sharp MRN: [id number] DOB : Nov 18, 1945 ATTENDING PHYSICIAN: @ATTENDING @    Date of Service   07/03/2022   HPI/Events of Note   Notified of HBG of 6.3 this morning.  Interventions   Assessment/Plan:  Transfuse 1U PRBC     This document was prepared using Dragon voice recognition software and may include unintentional dictation errors.  Neomia Glass DNP, MBA, FNP-BC Nurse Practitioner [facility] Pager [phone]

## 2022-07-04 ENCOUNTER — Encounter: Admission: EM | Disposition: E | Payer: Self-pay | Source: Home / Self Care | Attending: Internal Medicine

## 2022-07-04 DIAGNOSIS — I724 Aneurysm of artery of lower extremity: Secondary | ICD-10-CM | POA: Diagnosis not present

## 2022-07-04 DIAGNOSIS — Z515 Encounter for palliative care: Secondary | ICD-10-CM | POA: Diagnosis not present

## 2022-07-04 DIAGNOSIS — I70201 Unspecified atherosclerosis of native arteries of extremities, right leg: Secondary | ICD-10-CM

## 2022-07-04 DIAGNOSIS — I214 Non-ST elevation (NSTEMI) myocardial infarction: Secondary | ICD-10-CM | POA: Diagnosis not present

## 2022-07-04 DIAGNOSIS — T81718A Complication of other artery following a procedure, not elsewhere classified, initial encounter: Secondary | ICD-10-CM | POA: Diagnosis not present

## 2022-07-04 DIAGNOSIS — I701 Atherosclerosis of renal artery: Secondary | ICD-10-CM | POA: Diagnosis not present

## 2022-07-04 DIAGNOSIS — D62 Acute posthemorrhagic anemia: Secondary | ICD-10-CM | POA: Diagnosis not present

## 2022-07-04 DIAGNOSIS — Z9889 Other specified postprocedural states: Secondary | ICD-10-CM | POA: Diagnosis not present

## 2022-07-04 DIAGNOSIS — N186 End stage renal disease: Secondary | ICD-10-CM | POA: Diagnosis not present

## 2022-07-04 HISTORY — PX: LOWER EXTREMITY ANGIOGRAPHY: CATH118251

## 2022-07-04 LAB — CBC WITH DIFFERENTIAL/PLATELET
Abs Immature Granulocytes: 1.19 10*3/uL — ABNORMAL HIGH (ref 0.00–0.07)
Basophils Absolute: 0.2 10*3/uL — ABNORMAL HIGH (ref 0.0–0.1)
Basophils Relative: 1 %
Eosinophils Absolute: 0.3 10*3/uL (ref 0.0–0.5)
Eosinophils Relative: 1 %
HCT: 21 % — ABNORMAL LOW (ref 36.0–46.0)
Hemoglobin: 7.1 g/dL — ABNORMAL LOW (ref 12.0–15.0)
Immature Granulocytes: 4 %
Lymphocytes Relative: 7 %
Lymphs Abs: 1.9 10*3/uL (ref 0.7–4.0)
MCH: 28 pg (ref 26.0–34.0)
MCHC: 33.8 g/dL (ref 30.0–36.0)
MCV: 82.7 fL (ref 80.0–100.0)
Monocytes Absolute: 3.1 10*3/uL — ABNORMAL HIGH (ref 0.1–1.0)
Monocytes Relative: 11 %
Neutro Abs: 20.7 10*3/uL — ABNORMAL HIGH (ref 1.7–7.7)
Neutrophils Relative %: 76 %
Platelets: 125 10*3/uL — ABNORMAL LOW (ref 150–400)
RBC: 2.54 MIL/uL — ABNORMAL LOW (ref 3.87–5.11)
RDW: 15.9 % — ABNORMAL HIGH (ref 11.5–15.5)
Smear Review: NORMAL
WBC: 27.4 10*3/uL — ABNORMAL HIGH (ref 4.0–10.5)
nRBC: 0.2 % (ref 0.0–0.2)

## 2022-07-04 LAB — GLUCOSE, CAPILLARY
Glucose-Capillary: 112 mg/dL — ABNORMAL HIGH (ref 70–99)
Glucose-Capillary: 119 mg/dL — ABNORMAL HIGH (ref 70–99)
Glucose-Capillary: 119 mg/dL — ABNORMAL HIGH (ref 70–99)
Glucose-Capillary: 123 mg/dL — ABNORMAL HIGH (ref 70–99)
Glucose-Capillary: 123 mg/dL — ABNORMAL HIGH (ref 70–99)
Glucose-Capillary: 127 mg/dL — ABNORMAL HIGH (ref 70–99)
Glucose-Capillary: 130 mg/dL — ABNORMAL HIGH (ref 70–99)
Glucose-Capillary: 135 mg/dL — ABNORMAL HIGH (ref 70–99)
Glucose-Capillary: 137 mg/dL — ABNORMAL HIGH (ref 70–99)
Glucose-Capillary: 138 mg/dL — ABNORMAL HIGH (ref 70–99)
Glucose-Capillary: 165 mg/dL — ABNORMAL HIGH (ref 70–99)
Glucose-Capillary: 196 mg/dL — ABNORMAL HIGH (ref 70–99)
Glucose-Capillary: 24 mg/dL — CL (ref 70–99)
Glucose-Capillary: 324 mg/dL — ABNORMAL HIGH (ref 70–99)
Glucose-Capillary: 46 mg/dL — ABNORMAL LOW (ref 70–99)
Glucose-Capillary: 47 mg/dL — ABNORMAL LOW (ref 70–99)
Glucose-Capillary: 49 mg/dL — ABNORMAL LOW (ref 70–99)
Glucose-Capillary: 52 mg/dL — ABNORMAL LOW (ref 70–99)
Glucose-Capillary: 64 mg/dL — ABNORMAL LOW (ref 70–99)
Glucose-Capillary: 68 mg/dL — ABNORMAL LOW (ref 70–99)
Glucose-Capillary: 74 mg/dL (ref 70–99)
Glucose-Capillary: 82 mg/dL (ref 70–99)
Glucose-Capillary: 88 mg/dL (ref 70–99)
Glucose-Capillary: 99 mg/dL (ref 70–99)

## 2022-07-04 LAB — BASIC METABOLIC PANEL
Anion gap: 11 (ref 5–15)
BUN: 80 mg/dL — ABNORMAL HIGH (ref 8–23)
CO2: 27 mmol/L (ref 22–32)
Calcium: 7.8 mg/dL — ABNORMAL LOW (ref 8.9–10.3)
Chloride: 95 mmol/L — ABNORMAL LOW (ref 98–111)
Creatinine, Ser: 4.26 mg/dL — ABNORMAL HIGH (ref 0.44–1.00)
GFR, Estimated: 10 mL/min — ABNORMAL LOW (ref >60)
Glucose, Bld: 144 mg/dL — ABNORMAL HIGH (ref 70–99)
Potassium: 3.5 mmol/L (ref 3.5–5.1)
Sodium: 133 mmol/L — ABNORMAL LOW (ref 135–145)

## 2022-07-04 LAB — PHOSPHORUS: Phosphorus: 2.9 mg/dL (ref 2.5–4.6)

## 2022-07-04 LAB — TROPONIN I (HIGH SENSITIVITY): Troponin I (High Sensitivity): 840 ng/L (ref <18)

## 2022-07-04 LAB — HEMOGLOBIN AND HEMATOCRIT, BLOOD
HCT: 22.2 % — ABNORMAL LOW (ref 36.0–46.0)
HCT: 27.2 % — ABNORMAL LOW (ref 36.0–46.0)
HCT: 25.1 % — ABNORMAL LOW (ref 36.0–46.0)
Hemoglobin: 7.6 g/dL — ABNORMAL LOW (ref 12.0–15.0)
Hemoglobin: 9.1 g/dL — ABNORMAL LOW (ref 12.0–15.0)
Hemoglobin: 8.5 g/dL — ABNORMAL LOW (ref 12.0–15.0)

## 2022-07-04 LAB — MAGNESIUM: Magnesium: 1.6 mg/dL — ABNORMAL LOW (ref 1.7–2.4)

## 2022-07-04 LAB — PREPARE RBC (CROSSMATCH)

## 2022-07-04 LAB — GLUCOSE, RANDOM: Glucose, Bld: 120 mg/dL — ABNORMAL HIGH (ref 70–99)

## 2022-07-04 SURGERY — LOWER EXTREMITY ANGIOGRAPHY
Anesthesia: Moderate Sedation | Laterality: Right

## 2022-07-04 MED ORDER — SODIUM CHLORIDE 0.9 % IV SOLN: INTRAVENOUS | Status: DC

## 2022-07-04 MED ORDER — DIPHENHYDRAMINE HCL 50 MG/ML IJ SOLN: 50.0000 mg | Freq: Once | INTRAMUSCULAR | Status: DC | PRN

## 2022-07-04 MED ORDER — DEXTROSE 50 % IV SOLN
INTRAVENOUS | Status: AC
  Administered 2022-07-04: 50 mL
  Filled 2022-07-04: qty 50

## 2022-07-04 MED ORDER — ONDANSETRON HCL 4 MG/2ML IJ SOLN: 4.0000 mg | Freq: Four times a day (QID) | INTRAMUSCULAR | Status: DC | PRN

## 2022-07-04 MED ORDER — MIDODRINE HCL 5 MG PO TABS
10.0000 mg | ORAL_TABLET | Freq: Three times a day (TID) | ORAL | Status: DC
  Administered 2022-07-04 – 2022-07-05 (×4): 10 mg via ORAL
  Filled 2022-07-04 (×4): qty 2

## 2022-07-04 MED ORDER — DEXTROSE 50 % IV SOLN
12.5000 g | Freq: Once | INTRAVENOUS | Status: DC
  Filled 2022-07-04: qty 50

## 2022-07-04 MED ORDER — SODIUM CHLORIDE 0.9% IV SOLUTION: Freq: Once | INTRAVENOUS | Status: DC

## 2022-07-04 MED ORDER — FAMOTIDINE 20 MG PO TABS: 40.0000 mg | ORAL_TABLET | Freq: Once | ORAL | Status: DC | PRN

## 2022-07-04 MED ORDER — HYDROMORPHONE HCL 1 MG/ML IJ SOLN
1.0000 mg | Freq: Once | INTRAMUSCULAR | Status: AC | PRN
  Administered 2022-07-07: 1 mg via INTRAVENOUS
  Filled 2022-07-04: qty 1

## 2022-07-04 MED ORDER — DEXTROSE 50 % IV SOLN
1.0000 | Freq: Once | INTRAVENOUS | Status: AC
  Administered 2022-07-04: 50 mL via INTRAVENOUS

## 2022-07-04 MED ORDER — DEXTROSE 50 % IV SOLN
INTRAVENOUS | Status: AC
  Filled 2022-07-04: qty 50

## 2022-07-04 MED ORDER — FENTANYL CITRATE (PF) 100 MCG/2ML IJ SOLN
INTRAMUSCULAR | Status: DC | PRN
  Administered 2022-07-04 (×2): 25 ug via INTRAVENOUS

## 2022-07-04 MED ORDER — MIDAZOLAM HCL 2 MG/2ML IJ SOLN
INTRAMUSCULAR | Status: DC | PRN
  Administered 2022-07-04: .5 mg via INTRAVENOUS

## 2022-07-04 MED ORDER — SODIUM CHLORIDE 0.9% IV SOLUTION: Freq: Once | INTRAVENOUS | Status: AC

## 2022-07-04 MED ORDER — FENTANYL CITRATE (PF) 100 MCG/2ML IJ SOLN
INTRAMUSCULAR | Status: AC
  Filled 2022-07-04: qty 2

## 2022-07-04 MED ORDER — MIDAZOLAM HCL 2 MG/ML PO SYRP: 8.0000 mg | ORAL_SOLUTION | Freq: Once | ORAL | Status: DC | PRN

## 2022-07-04 MED ORDER — MIDAZOLAM HCL 2 MG/2ML IJ SOLN
INTRAMUSCULAR | Status: AC
  Filled 2022-07-04: qty 2

## 2022-07-04 MED ORDER — DEXTROSE 50 % IV SOLN
12.5000 g | Freq: Once | INTRAVENOUS | Status: AC
  Administered 2022-07-04: 12.5 g via INTRAVENOUS

## 2022-07-04 MED ORDER — MAGNESIUM SULFATE 4 GM/100ML IV SOLN: 4.0000 g | Freq: Once | INTRAVENOUS | Status: DC

## 2022-07-04 MED ORDER — IODIXANOL 320 MG/ML IV SOLN
INTRAVENOUS | Status: DC | PRN
  Administered 2022-07-04: 45 mL

## 2022-07-04 MED ORDER — MAGNESIUM SULFATE 2 GM/50ML IV SOLN
2.0000 g | Freq: Once | INTRAVENOUS | Status: AC
  Administered 2022-07-04: 2 g via INTRAVENOUS
  Filled 2022-07-04: qty 50

## 2022-07-04 MED ORDER — METHYLPREDNISOLONE SODIUM SUCC 125 MG IJ SOLR: 125.0000 mg | Freq: Once | INTRAMUSCULAR | Status: DC | PRN

## 2022-07-04 MED ORDER — DEXTROSE 50 % IV SOLN
12.5000 g | Freq: Once | INTRAVENOUS | Status: AC
  Administered 2022-07-04: 12.5 g via INTRAVENOUS
  Filled 2022-07-04: qty 50

## 2022-07-04 MED ORDER — MIDODRINE HCL 5 MG PO TABS: 10.0000 mg | ORAL_TABLET | Freq: Three times a day (TID) | ORAL | Status: DC

## 2022-07-04 MED ORDER — CEFAZOLIN SODIUM-DEXTROSE 1-4 GM/50ML-% IV SOLN: 1.0000 g | INTRAVENOUS | Status: DC

## 2022-07-04 SURGICAL SUPPLY — 18 items
BALLN ULTRVRSE 7X60X75C (BALLOONS) ×1
BALLOON ULTRVRSE 7X60X75C (BALLOONS) IMPLANT
CATH ANGIO 5F PIGTAIL 65CM (CATHETERS) IMPLANT
CATH TEMPO 5F RIM 65CM (CATHETERS) IMPLANT
DEVICE STARCLOSE SE CLOSURE (Vascular Products) IMPLANT
GLIDEWIRE ADV .035X260CM (WIRE) IMPLANT
KIT ENCORE 26 ADVANTAGE (KITS) IMPLANT
NDL PERC 18GX7CM (NEEDLE) IMPLANT
NEEDLE PERC 18GX7CM (NEEDLE) ×1 IMPLANT
PACK ANGIOGRAPHY (CUSTOM PROCEDURE TRAY) ×1 IMPLANT
SHEATH BRITE TIP 5FRX11 (SHEATH) IMPLANT
SHEATH FLEXOR ANSEL2 7FRX45 (SHEATH) IMPLANT
STENT VIABAHN 8X50X120 (Permanent Stent) IMPLANT
STENT VIABAHN5X120X8X (Permanent Stent) ×1 IMPLANT
SYR MEDRAD MARK 7 150ML (SYRINGE) IMPLANT
TUBING CONTRAST HIGH PRESS 72 (TUBING) IMPLANT
WIRE G V18X300CM (WIRE) IMPLANT
WIRE GUIDERIGHT .035X150 (WIRE) IMPLANT

## 2022-07-04 NOTE — Progress Notes (Signed)
PROGRESS NOTE    Kathleen Sharp  JOI:[id number] DOB: [birth year] DOA: 07/13/2022 PCP: Adalberto Ill, MD   Brief Narrative:  76 year old with history of ESRD on HD MWF, HTN, cardiomegaly, HLD presented to the ED with shortness of breath.  COVID/influenza were negative, troponins of 89, lactic acidosis.  Overnight had developed SVT and troponin peaked greater than 14,000.  Patient was initially started on heparin drip and later refused cardiac catheterization.  Hospital course was also complicated by syncopal episode.  CTA of the chest and CT of head and neck were negative for acute pathology.  Echocardiogram showed normal EF.  Due to arrhythmia amiodarone was planned by cardiology team.  Eventually agreed to cardiac cath which showed nonobstructive CAD.   Assessment & Plan:  Principal Problem:   NSTEMI (non-ST elevated myocardial infarction) ([place]) Active Problems:   Cardiac arrhythmia   Syncope and collapse   Pulmonary edema   Volume overload   Metabolic encephalopathy   Hyperkalemia   End stage renal disease (HCC)   Essential hypertension   Anemia of chronic renal failure, stage 5 (HCC)   Anemia, posthemorrhagic, acute   Pseudoaneurysm following procedure (HCC)    NSTEMI (non-ST elevated myocardial infarction) ([place]) -Troponin peaked greater than 14K.  Initially started on heparin drip and patient refused left heart catheterization which was eventually performed on 10/16 as patient was agreeable which revealed nonobstructive CAD.  Echo showed EF of 55%.  Eliquis and Plavix held   Acute anemia with hematemesis Hemorrhagic shock - Hemoglobin has drifted down.  CTA of the abdomen pelvis which is negative for any active bleed but shows focal outpouching of the right common femoral artery suspecting hematoma versus pseudoaneurysm.  Holding Eliquis and Plavix.  No further intervention by GI at this time Requiring vasopressors this morning.  We will slowly wean this off. We will transfuse  another unit of blood today to keep hemoglobin greater than 8  Right groin hematoma/pseudoaneurysm 1.9cm - Still no aneurysm seen on ultrasound.  Vascular plan for intervention today morning.  Cardiac arrhythmia Patient is experiencing transient, self-limiting cardiac arrhythmias where she became symptomatic.  Had 2 syncopal episode during current hospitalization.  Most likely secondary to recent NSTEMI. Cardiology started her on amiodarone infusion, which was discontinued due to symptomatic bradycardia overnight requiring atropine. -Continue with telemetry monitoring   Syncope and collapse Delirium/intermittent altered mental status. Initial peripheral blood pressures were showing low but after A-line placement they were normal therefore did not require any pressors. CT head was negative for any acute infarct.  Chronic lacunar infarcts noted. CTA head and neck was negative for any large vessel occlusion.  Some stenosis noted as mentioned in full report. -Continue to monitor    Metabolic encephalopathy Off and on.     Volume overload - Mainly managed with hemodialysis   Hyperkalemia Risk called, continue to monitor   End stage renal disease ([place]) Has a left upper extremity AV fistula.  Nephrology following.   Essential hypertension - Hold antihypertensives at this time.  IV as needed ordered.  Unable to get good blood pressure   DVT prophylaxis: Place and maintain sequential compression device Start: 07/03/22 1342 Place TED hose Start: 07/16/2022 1838 Code Status:  Family Communication:  No answer. Called [place]  Critically ill, closely monitoring hemoglobin.  Currently on pressors as well which we will attempt to slowly wean off    Subjective: Overnight patient had episode of hypoglycemia and hypotension requiring pressors and D5 fluid.  This morning  doing slightly better mentation is better as well.  No new complaints.  Examination:  Constitutional: Not in acute  distress.  Chronically ill-appearing Respiratory: Clear to auscultation bilaterally Cardiovascular: Normal sinus rhythm, no rubs Abdomen: Nontender nondistended good bowel sounds Musculoskeletal: No edema noted Skin: No rashes seen Neurologic: CN 2-12 grossly intact.  And nonfocal Psychiatric: Normal judgment and insight. Alert and oriented x 3. Normal mood.  Objective: Vitals:   07/16/2022 1045 07/07/2022 1047 07/06/2022 1050 06/16/2022 1052  BP: 98/79 98/79 (!) 87/74 (!) 87/74  Pulse:  (!) 102  (!) 102  Resp: $Remo'19 19 20 'LOIXN$ (!) 23  Temp:      TempSrc:      SpO2: 94% 97% 100% 100%  Weight:      Height:        Intake/Output Summary (Last 24 hours) at 07/03/2022 1128 Last data filed at 07/09/2022 0600 Gross per 24 hour  Intake 885.15 ml  Output 400 ml  Net 485.15 ml   Filed Weights   06/29/22 0415 07/03/22 1530 07/03/22 1914  Weight: 85.5 kg 86.8 kg 86.4 kg     Data Reviewed:   CBC: Recent Labs  Lab 07/12/2022 0403 07/02/22 0445 07/02/22 1603 07/03/22 0353 07/03/22 1153 07/03/22 1800 07/11/2022 0134 07/03/2022 0540  WBC 10.7* 11.0*  --  18.5* 19.5*  --   --  27.4*  NEUTROABS 7.9* 8.0*  --  12.2*  --   --   --  20.7*  HGB 8.1* 7.3*   < > 6.3* 7.1* 6.0* 7.6* 7.1*  HCT 25.8* 23.9*   < > 19.9* 21.9* 17.3* 22.2* 21.0*  MCV 81.6 83.3  --  81.6 83.0  --   --  82.7  PLT 162 147*  --  141* 124*  --   --  125*   < > = values in this interval not displayed.   Basic Metabolic Panel: Recent Labs  Lab 06/30/22 0555 06/23/2022 0403 07/02/22 0445 07/03/22 0353 06/22/2022 0540  NA 132* 132* 135 133* 133*  K 4.0 4.3 3.6 4.1 3.5  CL 94* 94* 98 98 95*  CO2 $Re'27 28 29 27 27  'Uqf$ GLUCOSE 110* 100* 100* 103* 144*  BUN 30* 41* 33* 96* 80*  CREATININE 5.87* 7.37* 4.69* 6.08* 4.26*  CALCIUM 8.5* 8.5* 8.3* 8.1* 7.8*  MG 1.6* 2.5* 1.9 1.9 1.6*  PHOS  --  5.2* 3.5 2.7 2.9   GFR: Estimated Creatinine Clearance: 11.8 mL/min (A) (by C-G formula based on SCr of 4.26 mg/dL (H)). Liver Function  Tests: Recent Labs  Lab 06/28/22 0635 06/30/22 0555  AST 85* 29  ALT 244* 124*  ALKPHOS 61 52  BILITOT 0.4 0.4  PROT 6.0* 6.4*  ALBUMIN 3.2* 3.7   No results for input(s): "LIPASE", "AMYLASE" in the last 168 hours. No results for input(s): "AMMONIA" in the last 168 hours. Coagulation Profile: Recent Labs  Lab 07/03/22 1530  INR 2.2*   Cardiac Enzymes: No results for input(s): "CKTOTAL", "CKMB", "CKMBINDEX", "TROPONINI" in the last 168 hours. BNP (last 3 results) No results for input(s): "PROBNP" in the last 8760 hours. HbA1C: No results for input(s): "HGBA1C" in the last 72 hours. CBG: Recent Labs  Lab 06/23/2022 0557 07/01/2022 0655 06/23/2022 0747 07/06/2022 0751 07/04/22 1027  GLUCAP 123* 99 68* 47* 196*   Lipid Profile: No results for input(s): "CHOL", "HDL", "LDLCALC", "TRIG", "CHOLHDL", "LDLDIRECT" in the last 72 hours. Thyroid Function Tests: No results for input(s): "TSH", "T4TOTAL", "FREET4", "T3FREE", "THYROIDAB" in the last 72 hours.  Anemia Panel: No results for input(s): "VITAMINB12", "FOLATE", "FERRITIN", "TIBC", "IRON", "RETICCTPCT" in the last 72 hours. Sepsis Labs: Recent Labs  Lab 07/03/22 1155 07/03/22 1530  LATICACIDVEN 1.3 1.3    Recent Results (from the past 240 hour(s))  MRSA Next Gen by PCR, Nasal     Status: None   Collection Time: 06/24/22  4:49 PM   Specimen: Nasal Mucosa; Nasal Swab  Result Value Ref Range Status   MRSA by PCR Next Gen NOT DETECTED NOT DETECTED Final    Comment: (NOTE) The GeneXpert MRSA Assay (FDA approved for NASAL specimens only), is one component of a comprehensive MRSA colonization surveillance program. It is not intended to diagnose MRSA infection nor to guide or monitor treatment for MRSA infections. Test performance is not FDA approved in patients less than 52 years old. Performed at [facility], [address], [place], [place] [address]          Radiology Studies: PERIPHERAL VASCULAR  CATHETERIZATION  Result Date: 06/17/2022 See surgical note for result.  CT HEAD WO CONTRAST (5MM)  Result Date: 07/03/2022 CLINICAL DATA:  Initial evaluation for mental status change, unknown cause. EXAM: CT HEAD WITHOUT CONTRAST TECHNIQUE: Contiguous axial images were obtained from the base of the skull through the vertex without intravenous contrast. RADIATION DOSE REDUCTION: This exam was performed according to the departmental dose-optimization program which includes automated exposure control, adjustment of the mA and/or kV according to patient size and/or use of iterative reconstruction technique. COMPARISON:  Prior study from 06/25/2022. FINDINGS: Brain: Cerebral volume within normal limits for age. Patchy multifocal hypodensities involving the supratentorial cerebral white matter, consistent with chronic small vessel ischemic disease. No acute intracranial hemorrhage. No acute large vessel territory infarct. No mass lesion, midline shift or mass effect. No hydrocephalus or extra-axial fluid collection. Vascular: No abnormal hyperdense vessel. Calcified atherosclerosis present at the skull base. Skull: Scalp soft tissues and calvarium within normal limits. Sinuses/Orbits: Question mild left periorbital soft tissue swelling, of uncertain significance (series 4, image 7). Globes and orbital soft tissues demonstrate no other acute finding. Chronic left sphenoid sinusitis noted. Paranasal sinuses are otherwise clear. Trace left mastoid effusion, of doubtful significance. Other: None. IMPRESSION: 1. No acute intracranial abnormality. 2. Moderate chronic small vessel ischemic disease. 3. Question mild left periorbital soft tissue swelling, of uncertain significance. Correlation with physical exam recommended. 4. Chronic left sphenoid sinusitis. Electronically Signed   By: Jeannine Boga M.D.   On: 07/03/2022 23:03   [place] Lower Ext Art Right Ltd  Result Date: 07/03/2022 CLINICAL DATA:  76 year old  female with history of hematoma after coronary catheterization on 06/16/2022 EXAM: Limited RIGHT LOWER EXTREMITY ARTERIAL DUPLEX SCAN TECHNIQUE: Gray-scale sonography as well as color Doppler and duplex ultrasound was performed to evaluate the lower extremity arteries including the common, superficial and profunda femoral arteries, popliteal artery and calf arteries. COMPARISON:  CTA abdomen pelvis from 07/03/2022. FINDINGS: About the anterior aspect of the right common femoral artery is a partially thrombosed pseudoaneurysm measuring approximately 1.9 x 0.4 x 1.3 cm. Color Doppler demonstrates central patency. There is a short neck measuring approximately 2.9 mm in length, 2.0 mm in with. The common femoral artery appears patent proximal and distal to the pseudoaneurysm. IMPRESSION: Access site pseudoaneurysm measuring up to 1.9 cm just anterior to the right common femoral artery. [name], MD [facility] Electronically Signed   By: [name] M.D.   On: 07/03/2022 16:26   DG Chest [facility]  1 View  Result Date: 07/03/2022 CLINICAL DATA:  Acute respiratory failure. EXAM: PORTABLE CHEST 1 VIEW COMPARISON:  Radiographs 06/30/2022 and 06/25/2022. FINDINGS: 1223 hours. Right-sided PICC previously projected to the level the right atrium; this line now demonstrates acute angulation at the level of the azygous vein and projects to the left, likely within the left brachiocephalic vein. Stable cardiomegaly and aortic atherosclerosis. There is mild vascular congestion with interval improved aeration of the right lung base. No consolidation, pneumothorax or significant pleural effusion identified. Avascular stent is noted in the left axilla. The bones appear unremarkable. Telemetry leads overlie the chest. IMPRESSION: 1. Interval improved aeration of the right lung base. 2. Interval angulation of the right-sided PICC, tip now likely within the left  brachiocephalic vein. 3. Cardiomegaly and mild vascular congestion. Electronically Signed   By: Richardean Sale M.D.   On: 07/03/2022 12:37   CT ANGIO GI BLEED  Result Date: 07/03/2022 CLINICAL DATA:  Mesenteric ischemia, acute EXAM: CTA ABDOMEN AND PELVIS WITHOUT AND WITH CONTRAST TECHNIQUE: Multidetector CT imaging of the abdomen and pelvis was performed using the standard protocol during bolus administration of intravenous contrast. Multiplanar reconstructed images and MIPs were obtained and reviewed to evaluate the vascular anatomy. RADIATION DOSE REDUCTION: This exam was performed according to the departmental dose-optimization program which includes automated exposure control, adjustment of the mA and/or kV according to patient size and/or use of iterative reconstruction technique. CONTRAST:  193mL OMNIPAQUE IOHEXOL 350 MG/ML SOLN COMPARISON:  None Available. FINDINGS: VASCULAR Aorta: Aortoiliac atherosclerosis.  No aneurysm or dissection. Celiac: Mild stenosis at the celiac origin. SMA: Patent without significant stenosis.  Mixed atherosclerosis. Renals: Patent without significant stenosis.  Mixed atherosclerosis. IMA: Patent without significant stenosis. Inflow: Mixed atherosclerosis. No significant stenosis. No aneurysm or dissection. Proximal Outflow: Mixed atherosclerosis without significant stenosis. There is a focal outpouching of the anterior right common femoral artery with a connecting neck, which does not significantly change on delay. This measures approximately 6 x 4 mm (series 6, image 68). There is adjacent soft tissue thickening/possible hematoma (series 5, image 168). Veins: No obvious venous abnormality. Review of the MIP images confirms the above findings. NON-VASCULAR Lower chest: Cardiomegaly. Trace pleural effusions. Lingular and right basilar atelectasis. Hepatobiliary: No focal liver abnormality. The gallbladder is decompressed with tiny layering internal stones and mild wall  thickening. Pancreas: No ductal dilation or peripancreatic inflammatory change. Spleen: Unremarkable. Adrenals/Urinary Tract: Mild left adrenal thickening without discrete nodule. Right adrenal gland is normal. Atrophic left kidney. Multiple renal cysts bilaterally in keeping with history of end-stage renal disease on dialysis. The bladder is mildly distended. Stomach/Bowel: The stomach is within normal limits. There is no evidence of bowel obstruction. There is no evidence of active gastrointestinal bleed. Study is somewhat limited by the presence of hyperattenuating material throughout the transverse and proximal descending colon. Sigmoid diverticulosis. Lymphatic: No lymphadenopathy. Reproductive: Prior hysterectomy. Other: No ascites.  No free air. Musculoskeletal: No acute osseous abnormality. No suspicious osseous lesion. Multilevel degenerative changes of the spine. IMPRESSION: No CT evidence of active gastrointestinal bleed. Focal outpouching of the anterior right common femoral artery with adjacent soft tissue thickening/possible small hematoma. This could represent a pseudoaneurysm. Recommend targeted ultrasound. No other acute findings in the abdomen or pelvis. Trace pleural effusions.  Cardiomegaly. Electronically Signed   By: Maurine Simmering M.D.   On: 07/03/2022 11:41        Scheduled Meds:  sodium chloride   Intravenous Once   Chlorhexidine Gluconate Cloth  6 each  Topical Q0600   dextrose  12.5 g Intravenous Once   fentaNYL       midazolam       pantoprazole (PROTONIX) IV  40 mg Intravenous Q12H   sodium chloride flush  10-40 mL Intracatheter Q12H   sodium chloride flush  3 mL Intravenous Q12H   sodium chloride flush  3 mL Intravenous Q12H   sodium chloride flush  3 mL Intravenous Q12H   Continuous Infusions:  sodium chloride     dextrose 40 mL/hr at 06/25/2022 0600   norepinephrine (LEVOPHED) Adult infusion 12 mcg/min (07/03/2022 0600)     LOS: 10 days   Time spent= 35  mins    Marri Mcneff Arsenio Loader, MD [facility]  If 7PM-7AM, please contact night-coverage  06/23/2022, 11:28 AM

## 2022-07-04 NOTE — Progress Notes (Signed)
[facility]  ROUNDING NOTE   Subjective:   Kathleen Sharp is a 76 year old female with past medical conditions including hyperlipidemia, hypertension, cardiomegaly, and end-stage renal disease on hemodialysis.  Patient presents to the emergency department with complaints of shortness of breath and has been admitted for Hyperkalemia [E87.5] SOB (shortness of breath) [R06.02] Tachyarrhythmia [R00.0] Volume overload [E87.70]  Patient is known to our practice and receives outpatient dialysis treatments at [facility] on a MWF schedule, supervised by Dr. Candiss Norse.    Patient was seen in the ICU.   Patient was awake, following commands.  Patient offers no new complaints   Objective:  Vital signs in last 24 hours:  Temp:  [97.6 F (36.4 C)-98.4 F (36.9 C)] 97.8 F (36.6 C) (10/19 1045) Pulse Rate:  [76-108] 80 (10/19 1531) Resp:  [15-33] 24 (10/19 1531) BP: (43-143)/(15-128) 107/60 (10/19 1531) SpO2:  [3 %-100 %] 97 % (10/19 1531)  Weight change:  Filed Weights   06/29/22 0415 07/03/22 1530 07/03/22 1914  Weight: 85.5 kg 86.8 kg 86.4 kg    Intake/Output: I/O last 3 completed shifts: In: 2131.7 [P.O.:240; I.V.:678.3; Blood:1213.3] Out: 400 [Other:400]   Intake/Output this shift:  No intake/output data recorded.  Physical Exam: General: NAD  Head: Normocephalic, atraumatic. Moist oral mucosal membranes  Eyes: Anicteric  Lungs:  Diminished in bases, normal effort, room air  Heart: Regular rate and rhythm  Abdomen:  Soft, nontender, obese  Extremities: Trace peripheral edema.  Neurologic: Nonfocal, moving all four extremities  Skin: No lesions  Access: Left AVG    Basic Metabolic Panel: Recent Labs  Lab 06/30/22 0555 06/28/2022 0403 07/02/22 0445 07/03/22 0353 07/10/2022 0540 06/23/2022 1222  NA 132* 132* 135 133* 133*  --   K 4.0 4.3 3.6 4.1 3.5  --   CL 94* 94* 98 98 95*  --   CO2 $Re'27 28 29 27 27  'QRc$ --   GLUCOSE 110* 100* 100* 103* 144* 120*   BUN 30* 41* 33* 96* 80*  --   CREATININE 5.87* 7.37* 4.69* 6.08* 4.26*  --   CALCIUM 8.5* 8.5* 8.3* 8.1* 7.8*  --   MG 1.6* 2.5* 1.9 1.9 1.6*  --   PHOS  --  5.2* 3.5 2.7 2.9  --     Liver Function Tests: Recent Labs  Lab 06/28/22 0635 06/30/22 0555  AST 85* 29  ALT 244* 124*  ALKPHOS 61 52  BILITOT 0.4 0.4  PROT 6.0* 6.4*  ALBUMIN 3.2* 3.7   No results for input(s): "LIPASE", "AMYLASE" in the last 168 hours. No results for input(s): "AMMONIA" in the last 168 hours.  CBC: Recent Labs  Lab 06/30/2022 0403 07/02/22 0445 07/02/22 1603 07/03/22 0353 07/03/22 1153 07/03/22 1800 07/07/2022 0134 07/09/2022 0540 07/15/2022 1222 06/17/2022 1713  WBC 10.7* 11.0*  --  18.5* 19.5*  --   --  27.4*  --   --   NEUTROABS 7.9* 8.0*  --  12.2*  --   --   --  20.7*  --   --   HGB 8.1* 7.3*   < > 6.3* 7.1* 6.0* 7.6* 7.1* 9.1* 8.5*  HCT 25.8* 23.9*   < > 19.9* 21.9* 17.3* 22.2* 21.0* 27.2* 25.1*  MCV 81.6 83.3  --  81.6 83.0  --   --  82.7  --   --   PLT 162 147*  --  141* 124*  --   --  125*  --   --    < > =  values in this interval not displayed.    Cardiac Enzymes: No results for input(s): "CKTOTAL", "CKMB", "CKMBINDEX", "TROPONINI" in the last 168 hours.  BNP: Invalid input(s): "POCBNP"  CBG: Recent Labs  Lab 07/06/2022 1414 06/24/2022 1523 07/12/2022 1525 06/21/2022 1637 07/07/2022 1836  GLUCAP 119* 24* 123* 138* 165*    Microbiology: Results for orders placed or performed during the hospital encounter of 07/15/2022  Resp Panel by RT-PCR (Flu A&B, Covid) Anterior Nasal Swab     Status: None   Collection Time: 07/07/2022  5:13 PM   Specimen: Anterior Nasal Swab  Result Value Ref Range Status   SARS Coronavirus 2 by RT PCR NEGATIVE NEGATIVE Final    Comment: (NOTE) SARS-CoV-2 target nucleic acids are NOT DETECTED.  The SARS-CoV-2 RNA is generally detectable in upper respiratory specimens during the acute phase of infection. The lowest concentration of SARS-CoV-2 viral copies this  assay can detect is 138 copies/mL. A negative result does not preclude SARS-Cov-2 infection and should not be used as the sole basis for treatment or other patient management decisions. A negative result may occur with  improper specimen collection/handling, submission of specimen other than nasopharyngeal swab, presence of viral mutation(s) within the areas targeted by this assay, and inadequate number of viral copies(<138 copies/mL). A negative result must be combined with clinical observations, patient history, and epidemiological information. The expected result is Negative.  Fact Sheet for Patients:  EntrepreneurPulse.com.au  Fact Sheet for Healthcare Providers:  IncredibleEmployment.be  This test is no t yet approved or cleared by the [place] FDA and  has been authorized for detection and/or diagnosis of SARS-CoV-2 by FDA under an Emergency Use Authorization (EUA). This EUA will remain  in effect (meaning this test can be used) for the duration of the COVID-19 declaration under Section 564(b)(1) of the Act, 21 U.S.C.section 360bbb-3(b)(1), unless the authorization is terminated  or revoked sooner.       Influenza A by PCR NEGATIVE NEGATIVE Final   Influenza B by PCR NEGATIVE NEGATIVE Final    Comment: (NOTE) The Xpert Xpress SARS-CoV-2/FLU/RSV plus assay is intended as an aid in the diagnosis of influenza from Nasopharyngeal swab specimens and should not be used as a sole basis for treatment. Nasal washings and aspirates are unacceptable for Xpert Xpress SARS-CoV-2/FLU/RSV testing.  Fact Sheet for Patients: EntrepreneurPulse.com.au  Fact Sheet for Healthcare Providers: IncredibleEmployment.be  This test is not yet approved or cleared by the [place] FDA and has been authorized for detection and/or diagnosis of SARS-CoV-2 by FDA under an Emergency Use Authorization (EUA). This EUA will  remain in effect (meaning this test can be used) for the duration of the COVID-19 declaration under Section 564(b)(1) of the Act, 21 U.S.C. section 360bbb-3(b)(1), unless the authorization is terminated or revoked.  Performed at [facility], [address] [place], [place] [address]   MRSA Next Gen by PCR, Nasal     Status: None   Collection Time: 06/24/22  4:49 PM   Specimen: Nasal Mucosa; Nasal Swab  Result Value Ref Range Status   MRSA by PCR Next Gen NOT DETECTED NOT DETECTED Final    Comment: (NOTE) The GeneXpert MRSA Assay (FDA approved for NASAL specimens only), is one component of a comprehensive MRSA colonization surveillance program. It is not intended to diagnose MRSA infection nor to guide or monitor treatment for MRSA infections. Test performance is not FDA approved in patients less than 74 years old. Performed at [facility], [address],  [place], [place] [address]     Coagulation Studies: Recent Labs    07/03/22 1530  LABPROT 24.3*  INR 2.2*     Urinalysis: No results for input(s): "COLORURINE", "LABSPEC", "PHURINE", "GLUCOSEU", "HGBUR", "BILIRUBINUR", "KETONESUR", "PROTEINUR", "UROBILINOGEN", "NITRITE", "LEUKOCYTESUR" in the last 72 hours.  Invalid input(s): "APPERANCEUR"    Imaging: PERIPHERAL VASCULAR CATHETERIZATION  Result Date: 06/22/2022 See surgical note for result.  CT HEAD WO CONTRAST (5MM)  Result Date: 07/03/2022 CLINICAL DATA:  Initial evaluation for mental status change, unknown cause. EXAM: CT HEAD WITHOUT CONTRAST TECHNIQUE: Contiguous axial images were obtained from the base of the skull through the vertex without intravenous contrast. RADIATION DOSE REDUCTION: This exam was performed according to the departmental dose-optimization program which includes automated exposure control, adjustment of the mA and/or kV according to patient size and/or use of iterative reconstruction technique. COMPARISON:  Prior study  from 06/25/2022. FINDINGS: Brain: Cerebral volume within normal limits for age. Patchy multifocal hypodensities involving the supratentorial cerebral white matter, consistent with chronic small vessel ischemic disease. No acute intracranial hemorrhage. No acute large vessel territory infarct. No mass lesion, midline shift or mass effect. No hydrocephalus or extra-axial fluid collection. Vascular: No abnormal hyperdense vessel. Calcified atherosclerosis present at the skull base. Skull: Scalp soft tissues and calvarium within normal limits. Sinuses/Orbits: Question mild left periorbital soft tissue swelling, of uncertain significance (series 4, image 7). Globes and orbital soft tissues demonstrate no other acute finding. Chronic left sphenoid sinusitis noted. Paranasal sinuses are otherwise clear. Trace left mastoid effusion, of doubtful significance. Other: None. IMPRESSION: 1. No acute intracranial abnormality. 2. Moderate chronic small vessel ischemic disease. 3. Question mild left periorbital soft tissue swelling, of uncertain significance. Correlation with physical exam recommended. 4. Chronic left sphenoid sinusitis. Electronically Signed   By: Jeannine Boga M.D.   On: 07/03/2022 23:03   [place] Lower Ext Art Right Ltd  Result Date: 07/03/2022 CLINICAL DATA:  76 year old female with history of hematoma after coronary catheterization on 06/19/2022 EXAM: Limited RIGHT LOWER EXTREMITY ARTERIAL DUPLEX SCAN TECHNIQUE: Gray-scale sonography as well as color Doppler and duplex ultrasound was performed to evaluate the lower extremity arteries including the common, superficial and profunda femoral arteries, popliteal artery and calf arteries. COMPARISON:  CTA abdomen pelvis from 07/03/2022. FINDINGS: About the anterior aspect of the right common femoral artery is a partially thrombosed pseudoaneurysm measuring approximately 1.9 x 0.4 x 1.3 cm. Color Doppler demonstrates central patency. There is a short neck  measuring approximately 2.9 mm in length, 2.0 mm in with. The common femoral artery appears patent proximal and distal to the pseudoaneurysm. IMPRESSION: Access site pseudoaneurysm measuring up to 1.9 cm just anterior to the right common femoral artery. [name], MD [facility] Radiology Electronically Signed   By: [name] M.D.   On: 07/03/2022 16:26   DG Chest Port 1 View  Result Date: 07/03/2022 CLINICAL DATA:  Acute respiratory failure. EXAM: PORTABLE CHEST 1 VIEW COMPARISON:  Radiographs 06/30/2022 and 06/25/2022. FINDINGS: 1223 hours. Right-sided PICC previously projected to the level the right atrium; this line now demonstrates acute angulation at the level of the azygous vein and projects to the left, likely within the left brachiocephalic vein. Stable cardiomegaly and aortic atherosclerosis. There is mild vascular congestion with interval improved aeration of the right lung base. No consolidation, pneumothorax or significant pleural effusion identified. Avascular stent is noted in the left axilla. The bones appear unremarkable. Telemetry leads overlie the chest. IMPRESSION: 1. Interval improved aeration of  the right lung base. 2. Interval angulation of the right-sided PICC, tip now likely within the left brachiocephalic vein. 3. Cardiomegaly and mild vascular congestion. Electronically Signed   By: Richardean Sale M.D.   On: 07/03/2022 12:37   CT ANGIO GI BLEED  Result Date: 07/03/2022 CLINICAL DATA:  Mesenteric ischemia, acute EXAM: CTA ABDOMEN AND PELVIS WITHOUT AND WITH CONTRAST TECHNIQUE: Multidetector CT imaging of the abdomen and pelvis was performed using the standard protocol during bolus administration of intravenous contrast. Multiplanar reconstructed images and MIPs were obtained and reviewed to evaluate the vascular anatomy. RADIATION DOSE REDUCTION: This exam was performed according to the departmental dose-optimization program  which includes automated exposure control, adjustment of the mA and/or kV according to patient size and/or use of iterative reconstruction technique. CONTRAST:  175mL OMNIPAQUE IOHEXOL 350 MG/ML SOLN COMPARISON:  None Available. FINDINGS: VASCULAR Aorta: Aortoiliac atherosclerosis.  No aneurysm or dissection. Celiac: Mild stenosis at the celiac origin. SMA: Patent without significant stenosis.  Mixed atherosclerosis. Renals: Patent without significant stenosis.  Mixed atherosclerosis. IMA: Patent without significant stenosis. Inflow: Mixed atherosclerosis. No significant stenosis. No aneurysm or dissection. Proximal Outflow: Mixed atherosclerosis without significant stenosis. There is a focal outpouching of the anterior right common femoral artery with a connecting neck, which does not significantly change on delay. This measures approximately 6 x 4 mm (series 6, image 68). There is adjacent soft tissue thickening/possible hematoma (series 5, image 168). Veins: No obvious venous abnormality. Review of the MIP images confirms the above findings. NON-VASCULAR Lower chest: Cardiomegaly. Trace pleural effusions. Lingular and right basilar atelectasis. Hepatobiliary: No focal liver abnormality. The gallbladder is decompressed with tiny layering internal stones and mild wall thickening. Pancreas: No ductal dilation or peripancreatic inflammatory change. Spleen: Unremarkable. Adrenals/Urinary Tract: Mild left adrenal thickening without discrete nodule. Right adrenal gland is normal. Atrophic left kidney. Multiple renal cysts bilaterally in keeping with history of end-stage renal disease on dialysis. The bladder is mildly distended. Stomach/Bowel: The stomach is within normal limits. There is no evidence of bowel obstruction. There is no evidence of active gastrointestinal bleed. Study is somewhat limited by the presence of hyperattenuating material throughout the transverse and proximal descending colon. Sigmoid  diverticulosis. Lymphatic: No lymphadenopathy. Reproductive: Prior hysterectomy. Other: No ascites.  No free air. Musculoskeletal: No acute osseous abnormality. No suspicious osseous lesion. Multilevel degenerative changes of the spine. IMPRESSION: No CT evidence of active gastrointestinal bleed. Focal outpouching of the anterior right common femoral artery with adjacent soft tissue thickening/possible small hematoma. This could represent a pseudoaneurysm. Recommend targeted ultrasound. No other acute findings in the abdomen or pelvis. Trace pleural effusions.  Cardiomegaly. Electronically Signed   By: Maurine Simmering M.D.   On: 07/03/2022 11:41     Medications:    sodium chloride     dextrose 20 mL/hr at 06/18/2022 1700   norepinephrine (LEVOPHED) Adult infusion 15 mcg/min (07/02/2022 0820)    Chlorhexidine Gluconate Cloth  6 each Topical Q0600   midodrine  10 mg Oral TID WC   pantoprazole (PROTONIX) IV  40 mg Intravenous Q12H   sodium chloride flush  10-40 mL Intracatheter Q12H   sodium chloride flush  3 mL Intravenous Q12H   sodium chloride flush  3 mL Intravenous Q12H   sodium chloride flush  3 mL Intravenous Q12H   sodium chloride, acetaminophen, fentaNYL (SUBLIMAZE) injection, guaiFENesin, haloperidol, hydrALAZINE, HYDROmorphone (DILAUDID) injection, ipratropium-albuterol, metoprolol tartrate, ondansetron (ZOFRAN) IV, ondansetron (ZOFRAN) IV, oxyCODONE, senna-docusate, sodium chloride flush, sodium chloride flush  Assessment/ Plan:  Ms. LAKETHA LEOPARD is a 76 y.o.  female with past medical conditions including hyperlipidemia, hypertension, cardiomegaly, and end-stage renal disease on hemodialysis.  Patient presents to the emergency department with complaints of shortness of breath and has been admitted for Hyperkalemia [E87.5] SOB (shortness of breath) [R06.02] Tachyarrhythmia [R00.0] Volume overload [E87.70]  [facility]/M/F/left AVG/82 kg  Volume overload/hyperkalemia  with end-stage renal disease on hemodialysis.  Potassium on ED arrival 5.9, increased to 6.2 after pharmacologic treatment. Patient hyperkalemia is now better          Patient is on Monday and Friday schedule as an outpatient.          No need for renal placement therapy today     2. Anemia of chronic kidney disease Lab Results  Component Value Date   HGB 8.5 (L) 06/30/2022    Patient hemoglobin now better We will follow with primary team/ICU team regarding need for PRBC. Patient did receive PRBC yesterday  3. Secondary Hyperparathyroidism: with outpatient labs: PTH 798, phosphorus 8.5, calcium 8.0 on 04/29/22.   Lab Results  Component Value Date   CALCIUM 7.8 (L) 07/15/2022   PHOS 2.9 06/17/2022     Continue sevelamer ordered with meals.  4.  Hypotensive  Patient is on pressors Cardiology is following closely  5.  NSTEMI with elevated troponin and ST depression with SVT on EKG.       Patient was earlier on IV heparin Patient is being closely followed by primary team as well as cardiology Patient had  cardiac catheterization - its showed minor luminal irregularities about 30% in LAD circumflex and RCA with no significant coronary artery disease  6.Altered mental status/ICU delirium. Patient primary team is following Patient is now clinically better  7.  Pseudoaneurysm following procedure  Patient was found to have pseudoaneurysm of right femoral artery Vascular team is following   LOS: 10 Orvan Papadakis s [place] 10/19/20237:16 PM

## 2022-07-04 NOTE — Progress Notes (Signed)
Kathleen Lame, MD [name]   [address] [place], [name] [address] Phone: [phone] Fax : [phone]   Subjective: Patient had sustained hypotension despite pressors all night long.  The patient has had no sign of any GI bleeding but an extension of her femoral aneurysm after cardiac cath.  The patient's systolic pressure this morning is 70 on pressors.  Patient does not answer any questions at this time.   Objective: Vital signs in last 24 hours: Vitals:   07/06/2022 0530 06/23/2022 0535 07/07/2022 0545 06/21/2022 0603  BP: (!) 69/51 (!) 55/46 (!) 67/57 92/71  Pulse: 80  78 80  Resp: 17 19 (!) 23 18  Temp:      TempSrc:      SpO2: 90%  100% 100%  Weight:      Height:       Weight change:   Intake/Output Summary (Last 24 hours) at 07/03/2022 0700 Last data filed at 07/03/2022 0600 Gross per 24 hour  Intake 1275.15 ml  Output 400 ml  Net 875.15 ml     Exam: Heart:: Regular rate and rhythm, S1S2 present, or without murmur or extra heart sounds Lungs: expiratory rhonchi Abdomen: soft, nontender, normal bowel sounds   Lab Results: $[email or web address]'[email or web address]$ @ Micro Results: Recent Results (from the past 240 hour(s))  MRSA Next Gen by PCR, Nasal     Status: None   Collection Time: 06/24/22  4:49 PM   Specimen: Nasal Mucosa; Nasal Swab  Result Value Ref Range Status   MRSA by PCR Next Gen NOT DETECTED NOT DETECTED Final    Comment: (NOTE) The GeneXpert MRSA Assay (FDA approved for NASAL specimens only), is one component of a comprehensive MRSA colonization surveillance program. It is not intended to diagnose MRSA infection nor to guide or monitor treatment for MRSA infections. Test performance is not FDA approved in patients less than 29 years old. Performed at [facility] [place] [place], [place] [id number]    Studies/Results: CT HEAD WO CONTRAST (5MM)  Result Date: 07/03/2022 CLINICAL DATA:  Initial evaluation for mental status change, unknown  cause. EXAM: CT HEAD WITHOUT CONTRAST TECHNIQUE: Contiguous axial images were obtained from the base of the skull through the vertex without intravenous contrast. RADIATION DOSE REDUCTION: This exam was performed according to the departmental dose-optimization program which includes automated exposure control, adjustment of the mA and/or kV according to patient size and/or use of iterative reconstruction technique. COMPARISON:  Prior study from 06/25/2022. FINDINGS: Brain: Cerebral volume within normal limits for age. Patchy multifocal hypodensities involving the supratentorial cerebral white matter, consistent with chronic small vessel ischemic disease. No acute intracranial hemorrhage. No acute large vessel territory infarct. No mass lesion, midline shift or mass effect. No hydrocephalus or extra-axial fluid collection. Vascular: No abnormal hyperdense vessel. Calcified atherosclerosis present at the skull base. Skull: Scalp soft tissues and calvarium within normal limits. Sinuses/Orbits: Question mild left periorbital soft tissue swelling, of uncertain significance (series 4, image 7). Globes and orbital soft tissues demonstrate no other acute finding. Chronic left sphenoid sinusitis noted. Paranasal sinuses are otherwise clear. Trace left mastoid effusion, of doubtful significance. Other: None. IMPRESSION: 1. No acute intracranial abnormality. 2. Moderate chronic small vessel ischemic disease. 3. Question mild left periorbital soft tissue swelling, of uncertain significance. Correlation with physical exam recommended. 4. Chronic left sphenoid sinusitis. Electronically Signed   By: Jeannine Boga M.D.   On: 07/03/2022 23:03   [place] Lower Ext Art Right Ltd  Result Date: 07/03/2022  CLINICAL DATA:  76 year old female with history of hematoma after coronary catheterization on 07/12/2022 EXAM: Limited RIGHT LOWER EXTREMITY ARTERIAL DUPLEX SCAN TECHNIQUE: Gray-scale sonography as well as color Doppler and  duplex ultrasound was performed to evaluate the lower extremity arteries including the common, superficial and profunda femoral arteries, popliteal artery and calf arteries. COMPARISON:  CTA abdomen pelvis from 07/03/2022. FINDINGS: About the anterior aspect of the right common femoral artery is a partially thrombosed pseudoaneurysm measuring approximately 1.9 x 0.4 x 1.3 cm. Color Doppler demonstrates central patency. There is a short neck measuring approximately 2.9 mm in length, 2.0 mm in with. The common femoral artery appears patent proximal and distal to the pseudoaneurysm. IMPRESSION: Access site pseudoaneurysm measuring up to 1.9 cm just anterior to the right common femoral artery. [name], MD [facility] Radiology Electronically Signed   By: [name] M.D.   On: 07/03/2022 16:26   DG Chest Port 1 View  Result Date: 07/03/2022 CLINICAL DATA:  Acute respiratory failure. EXAM: PORTABLE CHEST 1 VIEW COMPARISON:  Radiographs 06/30/2022 and 06/25/2022. FINDINGS: 1223 hours. Right-sided PICC previously projected to the level the right atrium; this line now demonstrates acute angulation at the level of the azygous vein and projects to the left, likely within the left brachiocephalic vein. Stable cardiomegaly and aortic atherosclerosis. There is mild vascular congestion with interval improved aeration of the right lung base. No consolidation, pneumothorax or significant pleural effusion identified. Avascular stent is noted in the left axilla. The bones appear unremarkable. Telemetry leads overlie the chest. IMPRESSION: 1. Interval improved aeration of the right lung base. 2. Interval angulation of the right-sided PICC, tip now likely within the left brachiocephalic vein. 3. Cardiomegaly and mild vascular congestion. Electronically Signed   By: Richardean Sale M.D.   On: 07/03/2022 12:37   CT ANGIO GI BLEED  Result Date: 07/03/2022 CLINICAL DATA:   Mesenteric ischemia, acute EXAM: CTA ABDOMEN AND PELVIS WITHOUT AND WITH CONTRAST TECHNIQUE: Multidetector CT imaging of the abdomen and pelvis was performed using the standard protocol during bolus administration of intravenous contrast. Multiplanar reconstructed images and MIPs were obtained and reviewed to evaluate the vascular anatomy. RADIATION DOSE REDUCTION: This exam was performed according to the departmental dose-optimization program which includes automated exposure control, adjustment of the mA and/or kV according to patient size and/or use of iterative reconstruction technique. CONTRAST:  193mL OMNIPAQUE IOHEXOL 350 MG/ML SOLN COMPARISON:  None Available. FINDINGS: VASCULAR Aorta: Aortoiliac atherosclerosis.  No aneurysm or dissection. Celiac: Mild stenosis at the celiac origin. SMA: Patent without significant stenosis.  Mixed atherosclerosis. Renals: Patent without significant stenosis.  Mixed atherosclerosis. IMA: Patent without significant stenosis. Inflow: Mixed atherosclerosis. No significant stenosis. No aneurysm or dissection. Proximal Outflow: Mixed atherosclerosis without significant stenosis. There is a focal outpouching of the anterior right common femoral artery with a connecting neck, which does not significantly change on delay. This measures approximately 6 x 4 mm (series 6, image 68). There is adjacent soft tissue thickening/possible hematoma (series 5, image 168). Veins: No obvious venous abnormality. Review of the MIP images confirms the above findings. NON-VASCULAR Lower chest: Cardiomegaly. Trace pleural effusions. Lingular and right basilar atelectasis. Hepatobiliary: No focal liver abnormality. The gallbladder is decompressed with tiny layering internal stones and mild wall thickening. Pancreas: No ductal dilation or peripancreatic inflammatory change. Spleen: Unremarkable. Adrenals/Urinary Tract: Mild left adrenal thickening without discrete nodule. Right adrenal gland is normal.  Atrophic left kidney. Multiple renal cysts bilaterally in keeping with history  of end-stage renal disease on dialysis. The bladder is mildly distended. Stomach/Bowel: The stomach is within normal limits. There is no evidence of bowel obstruction. There is no evidence of active gastrointestinal bleed. Study is somewhat limited by the presence of hyperattenuating material throughout the transverse and proximal descending colon. Sigmoid diverticulosis. Lymphatic: No lymphadenopathy. Reproductive: Prior hysterectomy. Other: No ascites.  No free air. Musculoskeletal: No acute osseous abnormality. No suspicious osseous lesion. Multilevel degenerative changes of the spine. IMPRESSION: No CT evidence of active gastrointestinal bleed. Focal outpouching of the anterior right common femoral artery with adjacent soft tissue thickening/possible small hematoma. This could represent a pseudoaneurysm. Recommend targeted ultrasound. No other acute findings in the abdomen or pelvis. Trace pleural effusions.  Cardiomegaly. Electronically Signed   By: Maurine Simmering M.D.   On: 07/03/2022 11:41   Medications: I have reviewed the patient's current medications. Scheduled Meds:  sodium chloride   Intravenous Once   atorvastatin  20 mg Oral q1800   atropine  0.5 mg Intravenous Once   Chlorhexidine Gluconate Cloth  6 each Topical Q0600   pantoprazole (PROTONIX) IV  40 mg Intravenous Q12H   sevelamer carbonate  1,600 mg Oral TID WC   sodium chloride flush  10-40 mL Intracatheter Q12H   sodium chloride flush  3 mL Intravenous Q12H   sodium chloride flush  3 mL Intravenous Q12H   sodium chloride flush  3 mL Intravenous Q12H   Continuous Infusions:  sodium chloride     dextrose 40 mL/hr at 07/12/2022 0600   norepinephrine (LEVOPHED) Adult infusion 12 mcg/min (06/19/2022 0600)   PRN Meds:.sodium chloride, acetaminophen, fentaNYL (SUBLIMAZE) injection, guaiFENesin, haloperidol, hydrALAZINE, ipratropium-albuterol, metoprolol tartrate,  ondansetron (ZOFRAN) IV, oxyCODONE, senna-docusate, sodium chloride flush, sodium chloride flush   Assessment: Principal Problem:   NSTEMI (non-ST elevated myocardial infarction) ([place]) Active Problems:   Anemia of chronic renal failure, stage 5 (HCC)   End stage renal disease (HCC)   Essential hypertension   Volume overload   Hyperkalemia   Pulmonary edema   Cardiac arrhythmia   Syncope and collapse   Metabolic encephalopathy   Anemia, posthemorrhagic, acute   Pseudoaneurysm following procedure ([place])    Plan: This patient has a GI consult for GI bleeding with no sign of GI bleeding.  The patient is hypotensive and not a candidate for any endoscopic procedures and it appears that the patient's bleeding is likely coming from the aneurysm.  The patient has continued to drop her hemoglobin without any overt signs.  Nothing further to do from a GI point of view.   LOS: 10 days   [place], Marval Regal 06/18/2022, 7:00 AM Pager [phone] 7am-5pm  Check AMION for 5pm -7am coverage and on weekends

## 2022-07-04 NOTE — Progress Notes (Signed)
NAME:  Kathleen Sharp, MRN:  [id number], DOB:  10-Nov-1945, LOS: 42 ADMISSION DATE:  06/26/2022, CONSULTATION DATE:  06/30/22 REFERRING MD:  Sharion Settler  REASON FOR CONSULT:  Hypotension    HPI  76 y.o  female with significant PMH of HTN, HLD, Cardiomegaly, ESRD on HD MWF who presented to the ED with chief complaints of SOB   ED Course: Initial vital signs showed HR of 95 beats/minute, BP mm Hg, the RR 29 breaths/minute, and the oxygen saturation 96 % on  4L and a temperature of 97.76F.   Pertinent Labs/Diagnostics Findings: Chemistry:Na+/ K+: 135/5.9 Glucose:172  BUN/Cr.:63/9.33 CBC: WBC:5.9  Hgb:11.8 Plts: 269 Other Lab findings: Lactic acid: 4.1 COVID PCR: Negative, Troponin: 89  Imaging:  CXR>1. Moderate pulmonary edema. 2. Moderate left and small right pleural effusions. 3. Cardiomegaly.uconate, vancomycin and cefepime Medications Administered: Diltiazem 10 mg IV one-time dose for SVT, Lokelma p.o., insulin aspart 10 mg, D50, calcium glI  Patient was admitted to hospitalist service for further management. See significant events for hospital course  Past Medical History  HTN, HLD, Cardiomegaly, ESRD on HD MWF  Significant Hospital Events   10/8: Admitted to PCU with Acute respiratory failure in the setting of pulmonary edema. Patient went into SVT requiring Diltiazem IV push  and Hyperkalemia correction. She was also started on Heparin gtt for NSTEMI. 10/9:Cardiology consulted who recommended cath but patient declined. Started on Plavix pending Echo. Nephrology consulted s/p urgent HD for volume removal. Patient had a syncopal episode. EF normal on Echo. 10/10:Started on IV amiodarone gtt for nonsustained ventricular tachycardia followed by atrial fibrillation associated with dizziness spell.CT Chest/CTA negative for CVA 10/11:Post HD 2083ml fluid removed 10/12: Rapid response called for altered mental status and hypoglycemia, VBG showed hypercapnia, patient placed on BiPAP.  Patient initially declined cath but later consented however due to change in mental status cardiac cath was cancelled. 10/13:Remained on Amiodarone gtt.  10/14:Pt completed 2.15 hours of 3.5 hour HD treatment. 700 ml removed patient became hypotensive but completed session. 10/15: Rapid response called for unresponsiveness, bradycardia and hypotension. Noted with cool extremities. Albumin administered, Amiodarone stopped and Atropine 0.5 mg administered x 2. Patient transferred to  the ICU. PCCM consulted. On arrival, art line placed which showed BP readings in the 035'C systolic with YEL>85. Pressors held. Remains on BiPAP  10/18: PCCM reconsulted pt developed acute respiratory failure secondary to pulmonary edema currently requiring 2L O2 via nasal canula.  Pt pending hemodialysis session  10/19: Overnight pt had episodes of hypoglycemia currently requiring D10W $RemoveBefore'@40'bLvfPVxtVtCVi$  ml/hr. Plans for vascular surgery to perform intervention of right femoral pseudoaneurysm. Pt requiring levophed gtt and prn blood transfusions due to acute blood loss anemia.  Pt with continued acute respiratory failure   Consults:  Nephrology Cardiology  Procedures:  Cardiac Catheterization 10/16:  Prox RCA lesion is 30% stenosed. Dist Cx lesion is 30% stenosed.Dist LAD lesion is 30% stenosed. The left ventricular systolic function is normal. The left ventricular ejection fraction is 50-55% by visual estimate.  Significant Diagnostic Tests:  06/25/22: CT head: No evidence of acute intracranial abnormality. Moderate chronic small ischemic disease within the cerebral white matter. Small chronic lacunar infarct within the left caudate head. Mild generalized cerebral atrophy. Left sphenoid sinusitis.   06/25/22: CTA neck: The common carotid and internal carotid arteries are patent within the neck without hemodynamically significant stenosis. Atherosclerotic plaque, bilaterally. Vertebral arteries patent within the neck. Mild  atherosclerotic narrowing at the origin of the dominant right vertebral artery.  Aortic Atherosclerosis (ICD10-I70.0) and Emphysema (ICD10-J43.9).   06/25/22: CTA head: No intracranial large vessel occlusion is identified. Intracranial atherosclerotic disease with multifocal stenoses, most notably as follows. Severe stenosis within the left vertebral artery proximal V4 segment. Mild-to-moderate stenosis within the paraclinoid right ICA.  06/25/22: CXR: Slightly improved aeration of bilateral lungs with persistent interstitial pulmonary opacities, most consistent with pulmonary edema. Small bilateral pleural effusions.Cardiomegaly.  10/18: CTA GI Bleed: No CT evidence of active gastrointestinal bleed. Focal outpouching of the anterior right common femoral artery with adjacent soft tissue thickening/possible small hematoma. This could represent a pseudoaneurysm.Recommend targeted ultrasound. No other acute findings in the abdomen or pelvis. Trace pleural effusions.  Cardiomegaly  10/18: [place] ART RLE: Access site pseudoaneurysm measuring up to 1.9 cm just anterior to the right common femoral artery.   Micro Data:  10/8: SARS-CoV-2 PCR> negative 10/8: Influenza PCR> negative 10/9: MRSA PCR>>negative  Antimicrobials:  None  OBJECTIVE  Blood pressure (!) 82/38, pulse 86, temperature 97.6 F (36.4 C), resp. rate (!) 25, height $RemoveBe'5\' 2"'fmzRwSmaz$  (1.575 m), weight 86.4 kg, SpO2 100 %.        Intake/Output Summary (Last 24 hours) at 07/03/2022 0859 Last data filed at 07/15/2022 0600 Gross per 24 hour  Intake 885.15 ml  Output 400 ml  Net 485.15 ml   Filed Weights   06/29/22 0415 07/03/22 1530 07/03/22 1914  Weight: 85.5 kg 86.8 kg 86.4 kg    Physical Examination  GENERAL: Acute on chronically-ill appearing female in mild respiratory distress  HEENT: Head atraumatic, normocephalic. Supple, no JVD. LUNGS: Diffuse rhonchi throughout, mild tachypnea  CARDIOVASCULAR: NSR, s1s2, rrr, no r/g, 2+ radial/1+  distal pulses, no edema  ABDOMEN: +BS x4, obese, soft, non tender, non distended   EXTREMITIES: Normal bulk and tone.  Moves all extremities. LUE fistula +bruit/thrill   NEUROLOGIC: Alert and confused to situation at times. Following commands  SKIN: No obvious rash, lesion, or ulcer.   Labs/imaging that I havepersonally reviewed  (right click and "Reselect all SmartList Selections" daily)     Labs   CBC: Recent Labs  Lab 06/24/2022 0403 07/02/22 0445 07/02/22 1603 07/03/22 0353 07/03/22 1153 07/03/22 1800 07/07/2022 0134 07/11/2022 0540  WBC 10.7* 11.0*  --  18.5* 19.5*  --   --  27.4*  NEUTROABS 7.9* 8.0*  --  12.2*  --   --   --  20.7*  HGB 8.1* 7.3*   < > 6.3* 7.1* 6.0* 7.6* 7.1*  HCT 25.8* 23.9*   < > 19.9* 21.9* 17.3* 22.2* 21.0*  MCV 81.6 83.3  --  81.6 83.0  --   --  82.7  PLT 162 147*  --  141* 124*  --   --  125*   < > = values in this interval not displayed.    Basic Metabolic Panel: Recent Labs  Lab 06/30/22 0555 07/03/2022 0403 07/02/22 0445 07/03/22 0353 07/16/2022 0540  NA 132* 132* 135 133* 133*  K 4.0 4.3 3.6 4.1 3.5  CL 94* 94* 98 98 95*  CO2 $Re'27 28 29 27 27  'anb$ GLUCOSE 110* 100* 100* 103* 144*  BUN 30* 41* 33* 96* 80*  CREATININE 5.87* 7.37* 4.69* 6.08* 4.26*  CALCIUM 8.5* 8.5* 8.3* 8.1* 7.8*  MG 1.6* 2.5* 1.9 1.9 1.6*  PHOS  --  5.2* 3.5 2.7 2.9   GFR: Estimated Creatinine Clearance: 11.8 mL/min (A) (by C-G formula based on SCr of 4.26 mg/dL (H)). Recent Labs  Lab 07/02/22 0445 07/03/22 0353 07/03/22 1153  07/03/22 1155 07/03/22 1530 06/24/2022 0540  WBC 11.0* 18.5* 19.5*  --   --  27.4*  LATICACIDVEN  --   --   --  1.3 1.3  --     Liver Function Tests: Recent Labs  Lab 06/27/22 1045 06/28/22 0635 06/30/22 0555  AST 169* 85* 29  ALT 342* 244* 124*  ALKPHOS 69 61 52  BILITOT 0.8 0.4 0.4  PROT 6.6 6.0* 6.4*  ALBUMIN 3.5 3.2* 3.7   No results for input(s): "LIPASE", "AMYLASE" in the last 168 hours. No results for input(s): "AMMONIA" in the  last 168 hours.  ABG    Component Value Date/Time   PHART 7.35 06/30/2022 0730   PCO2ART 52 (H) 06/30/2022 0730   PO2ART 196 (H) 06/30/2022 0730   HCO3 28.7 (H) 06/30/2022 0730   O2SAT 100 06/30/2022 0730     Coagulation Profile: Recent Labs  Lab 07/03/22 1530  INR 2.2*     Cardiac Enzymes: No results for input(s): "CKTOTAL", "CKMB", "CKMBINDEX", "TROPONINI" in the last 168 hours.  HbA1C: No results found for: "HGBA1C"  CBG: Recent Labs  Lab 06/28/2022 0458 06/16/2022 0557 07/11/2022 0655 06/27/2022 0747 07/07/2022 0751  GLUCAP 112* 123* 99 68* 47*    Review of Systems: Positives in BOLD   Gen: Denies fever, chills, weight change, fatigue, night sweats HEENT: Denies blurred vision, double vision, hearing loss, tinnitus, sinus congestion, rhinorrhea, sore throat, neck stiffness, dysphagia PULM: shortness of breath, cough, sputum production, hemoptysis, wheezing CV: Denies chest pain, edema, orthopnea, paroxysmal nocturnal dyspnea, palpitations GI: Denies abdominal pain, nausea, vomiting, diarrhea, hematochezia, melena, constipation, change in bowel habits GU: Denies dysuria, hematuria, polyuria, oliguria, urethral discharge Endocrine: Denies hot or cold intolerance, polyuria, polyphagia or appetite change Derm: Denies rash, dry skin, scaling or peeling skin change Heme: Denies easy bruising, bleeding, bleeding gums Neuro: Denies headache, numbness, weakness, slurred speech, loss of memory or consciousness  Past Medical History  She,  has a past medical history of Anemia, Arthritis, Chronic kidney disease, Hypercholesterolemia, and Hypertension.   Surgical History    Past Surgical History:  Procedure Laterality Date   ABDOMINAL HYSTERECTOMY     AV FISTULA PLACEMENT Left 12/15/2015   Procedure: INSERTION OF ARTERIOVENOUS (AV) GORE-TEX GRAFT ARM             ( BRACHIAL AXILLARY GRAFT );  Surgeon: [name], MD;  Location: [facility];  Service: Vascular;  Laterality:  Left;   CAPD INSERTION N/A 11/24/2015   Procedure: Exploratory laparoscopy;  Surgeon: [name], MD;  Location: [facility];  Service: Vascular;  Laterality: N/A;, attempted insertion but unable due to scar tissue   LEFT HEART CATH AND CORONARY ANGIOGRAPHY N/A 06/30/2022   Procedure: LEFT HEART CATH AND CORONARY ANGIOGRAPHY;  Surgeon: Dionisio David, MD;  Location: [place] [facility];  Service: Cardiovascular;  Laterality: N/A;   PERIPHERAL VASCULAR CATHETERIZATION Left 02/27/2016   Procedure: Thrombectomy;  Surgeon: [name], MD;  Location: [place] [facility];  Service: Cardiovascular;  Laterality: Left;   PERIPHERAL VASCULAR CATHETERIZATION Left 04/12/2016   Procedure: Thrombectomy;  Surgeon: [name], MD;  Location: [place] [facility];  Service: Cardiovascular;  Laterality: Left;   PERIPHERAL VASCULAR CATHETERIZATION Left 05/22/2016   Procedure: Thrombectomy;  Surgeon: [name], MD;  Location: [place] [facility];  Service: Cardiovascular;  Laterality: Left;   PERIPHERAL VASCULAR CATHETERIZATION N/A 05/22/2016   Procedure: A/V Shuntogram/Fistulagram;  Surgeon: [name], MD;  Location: [place]  [facility];  Service: Cardiovascular;  Laterality: N/A;   PERIPHERAL VASCULAR CATHETERIZATION Left 06/21/2016   Procedure: Thrombectomy;  Surgeon: [name], MD;  Location: [place] [facility];  Service: Cardiovascular;  Laterality: Left;     Social History   reports that she quit smoking about 9 years ago. Her smoking use included cigarettes. She has a 15.00 pack-year smoking history. She has never used smokeless tobacco. She reports that she does not drink alcohol and does not use drugs.   Family History   Her family history includes Heart disease in her mother; Kidney disease in her brother and father.   Allergies No Known Allergies   Home Medications  Prior to Admission medications   Medication Sig Start Date End Date Taking? Authorizing  Provider  carvedilol (COREG) 6.25 MG tablet Take 6.25 mg by mouth 2 (two) times daily with a meal.   Yes [provider]  clopidogrel (PLAVIX) 75 MG tablet Take 75 mg by mouth daily.   Yes [provider]  lidocaine-prilocaine (EMLA) cream Apply 1 Application topically daily as needed (dialysis treatment).   Yes [provider]  lisinopril (ZESTRIL) 40 MG tablet Take 40 mg by mouth daily.   Yes [provider]  loratadine (CLARITIN) 10 MG tablet Take 10 mg by mouth daily.   Yes [provider]  Multiple Vitamins-Minerals (MULTIVITAMIN WITH MINERALS) tablet Take 1 tablet by mouth daily.   Yes [provider]  sevelamer carbonate (RENVELA) 800 MG tablet Take 1,600 mg by mouth 3 (three) times daily with meals.   Yes [provider]    Scheduled Meds:  sodium chloride   Intravenous Once   atorvastatin  20 mg Oral q1800   atropine  0.5 mg Intravenous Once   Chlorhexidine Gluconate Cloth  6 each Topical Q0600   pantoprazole (PROTONIX) IV  40 mg Intravenous Q12H   sevelamer carbonate  1,600 mg Oral TID WC   sodium chloride flush  10-40 mL Intracatheter Q12H   sodium chloride flush  3 mL Intravenous Q12H   sodium chloride flush  3 mL Intravenous Q12H   sodium chloride flush  3 mL Intravenous Q12H   Continuous Infusions:  sodium chloride     sodium chloride      ceFAZolin (ANCEF) IV     dextrose 40 mL/hr at 07/12/2022 0600   magnesium sulfate bolus IVPB     norepinephrine (LEVOPHED) Adult infusion 12 mcg/min (07/05/2022 0600)   PRN Meds:.sodium chloride, acetaminophen, diphenhydrAMINE, famotidine, fentaNYL (SUBLIMAZE) injection, guaiFENesin, haloperidol, hydrALAZINE, HYDROmorphone (DILAUDID) injection, ipratropium-albuterol, methylPREDNISolone (SOLU-MEDROL) injection, metoprolol tartrate, midazolam, ondansetron (ZOFRAN) IV, ondansetron (ZOFRAN) IV, oxyCODONE, senna-docusate, sodium chloride flush, sodium chloride flush   Active  Hospital Problem list       Assessment & Plan:  Acute hypoxic hypercapnic respiratory failure secondary to pulmonary edema - Supplemental O2 as needed to maintain O2 saturations 88 to 92% - Prn Bipap for dyspnea and/or hypoxia  - Follow intermittent ABG and chest x-ray as needed - As needed bronchodilators   NSTEMI Hypotension secondary to acute blood loss anemia  PMHx: CAD, HLD, HTN   Cardiac Cath 10/16: minor luminal irregularities about 30% in LAD circumflex and RCA with no significant coronary artery disease.  EF 50-55% - Continuous cardiac monitoring - Vasopressors as needed to maintain MAP >65 - Prn metoprolol for heart rate control  - Hold outpatient aspirin and plavix due to anemia  - Atorvastatin $RemoveBefore'20mg'[id number]$  PO daily - Cardiology following, appreciate input  AKI on  ESRD on HD MWF Hypomagnesia  - Trend BMP  - Replace electrolytes as indicated  - Avoid nephrotoxic medications as indicated  - Nephrology following appreciate input: HD per recommendations   Acute blood loss anemia secondary to right common femoral artery pseudoaneurysm  Thrombocytopenia   - Trend CBC - Monitor for s/sx of bleeding and transfuse for hgb <7 - Continue IV PPI  - GI consulted appreciate input - Vascular surgery consulted pt to undergo vascular repair of pseudoaneurysm   Acute Metabolic Encephalopathy due to above   CT Head 10/10: negative for acute intracranial abnormality - Provide supportive care   Hypoglycemic Episodes - CBGs - Follow ICU hyper/hypoglycemia protocol  Best practice:  Diet:  NPO Pain/Anxiety/Delirium protocol (if indicated): No VAP protocol (if indicated): Not indicated DVT prophylaxis: SCD's GI prophylaxis: N/A Glucose control: N/A Central venous access: Right upper arm PICC line  Arterial line: N/A Foley: N/A Mobility:  bed rest  PT consulted: N/A Last date of multidisciplinary goals of care discussion [06/30/2022] Code Status: DNR Disposition: ICU    Will ask  Palliative Care team to see pt again due to continued decline in pts condition to discuss goals of care  Critical care time: 40 minutes       Donell Beers, [place] Pager [phone] (please enter 7 digits) PCCM Consult Pager [phone] (please enter 7 digits)

## 2022-07-04 NOTE — Consult Note (Signed)
[facility] Vascular Consult Note  MRN : [id number]  Kathleen Sharp is a 76 y.o. ([birth year]) female who presents with chief complaint of  Chief Complaint  Patient presents with   Shortness of Breath  .   Consulting Physician:Ankit Reesa Chew, MD Reason for consult: Right common femoral artery pseudoaneurysm History of Present Illness: Kathleen Sharp is a 76 year old female with a previous medical history of end-stage renal disease, hypertension, hyperlipidemia and cardiomegaly that presented to [facility] with shortness of breath.  The patient was found to be an acute respiratory failure in the setting of pulmonary edema and developed SVT and ultimately had an NSTEMI.  The patient also presented with hyperkalemia as well.  Cardiac catheterization was recommended however the patient initially declined.  The patient had several other episodes of arrhythmias and developed altered mental status.  She ultimately underwent cardiac catheterization on 07/07/2022.  The patient then subsequently began to develop anemia without any overt signs of bleeding.  The patient underwent a CTA to evaluate for GI bleed but no evidence of active GI bleed was seen however there was the presence of a focal outpouching concerning for pseudoaneurysm.  Subsequent duplex confirmed presence of a right common femoral artery pseudoaneurysm.  Current Facility-Administered Medications  Medication Dose Route Frequency Provider Last Rate Last Admin   0.9 %  sodium chloride infusion (Manually program via Guardrails IV Fluids)   Intravenous Once Pudota, Kathe Becton, MD       0.9 %  sodium chloride infusion  250 mL Intravenous PRN Dionisio David, MD       acetaminophen (TYLENOL) tablet 650 mg  650 mg Oral Q4H PRN Neoma Laming A, MD       atorvastatin (LIPITOR) tablet 20 mg  20 mg Oral q1800 Cox, Amy N, DO   20 mg at 07/02/22 1802   atropine 1 MG/10ML injection 0.5 mg  0.5 mg Intravenous  Once Sharion Settler, NP       Chlorhexidine Gluconate Cloth 2 % PADS 6 each  6 each Topical Q0600 Cox, Amy N, DO   6 each at 07/03/22 0700   dextrose 10 % infusion   Intravenous Continuous Rust-Chester, Huel Cote, NP 10 mL/hr at 07/03/22 2300 Infusion Verify at 07/03/22 2300   fentaNYL (SUBLIMAZE) injection 12.5 mcg  12.5 mcg Intravenous Q10 min PRN Rust-Chester, Huel Cote, NP       guaiFENesin (ROBITUSSIN) 100 MG/5ML liquid 5 mL  5 mL Oral Q4H PRN Teressa Lower, NP   5 mL at 07/03/22 0350   haloperidol (HALDOL) tablet 2 mg  2 mg Oral Q6H PRN Flora Lipps, MD       hydrALAZINE (APRESOLINE) injection 10 mg  10 mg Intravenous Q4H PRN Amin, Ankit Chirag, MD       ipratropium-albuterol (DUONEB) 0.5-2.5 (3) MG/3ML nebulizer solution 3 mL  3 mL Nebulization Q4H PRN Amin, Ankit Chirag, MD   3 mL at 07/03/22 1215   metoprolol tartrate (LOPRESSOR) injection 5 mg  5 mg Intravenous Q4H PRN Amin, Ankit Chirag, MD       norepinephrine (LEVOPHED) 16 mg in 252mL (0.064 mg/mL) premix infusion  0-40 mcg/min Intravenous Titrated Rust-Chester, Britton L, NP 3.75 mL/hr at 07/03/22 2300 4 mcg/min at 07/03/22 2300   ondansetron (ZOFRAN) injection 4 mg  4 mg Intravenous Q6H PRN Amin, Ankit Chirag, MD       oxyCODONE (Oxy IR/ROXICODONE) immediate release tablet 5 mg  5 mg Oral Q4H PRN Amin, Ankit  Chirag, MD       pantoprazole (PROTONIX) injection 40 mg  40 mg Intravenous Q12H Amin, Ankit Chirag, MD   40 mg at 07/03/22 2111   senna-docusate (Senokot-S) tablet 1 tablet  1 tablet Oral QHS PRN Amin, Ankit Chirag, MD       sevelamer carbonate (RENVELA) tablet 1,600 mg  1,600 mg Oral TID WC Cox, Amy N, DO   1,600 mg at 06/30/22 1337   sodium chloride flush (NS) 0.9 % injection 10-40 mL  10-40 mL Intracatheter Q12H Pudota, Kathe Becton, MD   10 mL at 07/03/22 2111   sodium chloride flush (NS) 0.9 % injection 10-40 mL  10-40 mL Intracatheter PRN Pudota, Kathe Becton, MD       sodium chloride flush (NS) 0.9 % injection 3 mL  3 mL  Intravenous Q12H Scoggins, Amber, NP   3 mL at 07/03/22 1031   sodium chloride flush (NS) 0.9 % injection 3 mL  3 mL Intravenous Q12H Scoggins, Amber, NP   3 mL at 07/03/22 1030   sodium chloride flush (NS) 0.9 % injection 3 mL  3 mL Intravenous Q12H Neoma Laming A, MD   3 mL at 07/03/22 1030   sodium chloride flush (NS) 0.9 % injection 3 mL  3 mL Intravenous PRN Dionisio David, MD       Facility-Administered Medications Ordered in Other Encounters  Medication Dose Route Frequency Provider Last Rate Last Admin   epoetin alfa (EPOGEN,PROCRIT) injection 10,000 Units  10,000 Units Subcutaneous Once Lequita Asal, MD        Past Medical History:  Diagnosis Date   Anemia    Arthritis    Chronic kidney disease    Chronic Kidney Disease   Hypercholesterolemia    Hypertension     Past Surgical History:  Procedure Laterality Date   ABDOMINAL HYSTERECTOMY     AV FISTULA PLACEMENT Left 12/15/2015   Procedure: INSERTION OF ARTERIOVENOUS (AV) GORE-TEX GRAFT ARM             ( BRACHIAL AXILLARY GRAFT );  Surgeon: [name], MD;  Location: [facility];  Service: Vascular;  Laterality: Left;   CAPD INSERTION N/A 11/24/2015   Procedure: Exploratory laparoscopy;  Surgeon: [name], MD;  Location: [facility];  Service: Vascular;  Laterality: N/A;, attempted insertion but unable due to scar tissue   LEFT HEART CATH AND CORONARY ANGIOGRAPHY N/A 07/05/2022   Procedure: LEFT HEART CATH AND CORONARY ANGIOGRAPHY;  Surgeon: Dionisio David, MD;  Location: [place] [facility];  Service: Cardiovascular;  Laterality: N/A;   PERIPHERAL VASCULAR CATHETERIZATION Left 02/27/2016   Procedure: Thrombectomy;  Surgeon: [name], MD;  Location: [place] [facility];  Service: Cardiovascular;  Laterality: Left;   PERIPHERAL VASCULAR CATHETERIZATION Left 04/12/2016   Procedure: Thrombectomy;  Surgeon: [name], MD;  Location: [place] [facility];  Service: Cardiovascular;  Laterality:  Left;   PERIPHERAL VASCULAR CATHETERIZATION Left 05/22/2016   Procedure: Thrombectomy;  Surgeon: [name], MD;  Location: [place] [facility];  Service: Cardiovascular;  Laterality: Left;   PERIPHERAL VASCULAR CATHETERIZATION N/A 05/22/2016   Procedure: A/V Shuntogram/Fistulagram;  Surgeon: [name], MD;  Location: [place] [facility];  Service: Cardiovascular;  Laterality: N/A;   PERIPHERAL VASCULAR CATHETERIZATION Left 06/21/2016   Procedure: Thrombectomy;  Surgeon: [name], MD;  Location: [place] [facility];  Service: Cardiovascular;  Laterality: Left;    Social History Social History   Tobacco  Use   Smoking status: Former    Packs/day: 1.00    Years: 15.00    Total pack years: 15.00    Types: Cigarettes    Quit date: 09/16/2012    Years since quitting: 9.8   Smokeless tobacco: Never  Substance Use Topics   Alcohol use: No    Alcohol/week: 0.0 standard drinks of alcohol   Drug use: No    Family History Family History  Problem Relation Age of Onset   Kidney disease Father    Heart disease Mother    Kidney disease Brother     No Known Allergies   REVIEW OF SYSTEMS (Negative unless checked)  Constitutional: [] Weight loss  [] Fever  [] Chills Cardiac: [] Chest pain   [] Chest pressure   [] Palpitations   [] Shortness of breath when laying flat   [] Shortness of breath at rest   [] Shortness of breath with exertion. Vascular:  [] Pain in legs with walking   [] Pain in legs at rest   [] Pain in legs when laying flat   [] Claudication   [] Pain in feet when walking  [] Pain in feet at rest  [] Pain in feet when laying flat   [] History of DVT   [] Phlebitis   [] Swelling in legs   [] Varicose veins   [] Non-healing ulcers Pulmonary:   [] Uses home oxygen   [] Productive cough   [] Hemoptysis   [] Wheeze  [] COPD   [] Asthma Neurologic:  [] Dizziness  [] Blackouts   [] Seizures   [] History of stroke   [] History of TIA  [] Aphasia   [] Temporary blindness   [] Dysphagia   [] Weakness or  numbness in arms   [] Weakness or numbness in legs Musculoskeletal:  [] Arthritis   [] Joint swelling   [] Joint pain   [] Low back pain Hematologic:  [] Easy bruising  [] Easy bleeding   [] Hypercoagulable state   [] Anemic  [] Hepatitis Gastrointestinal:  [] Blood in stool   [] Vomiting blood  [] Gastroesophageal reflux/heartburn   [] Difficulty swallowing. Genitourinary:  [] Chronic kidney disease   [] Difficult urination  [] Frequent urination  [] Burning with urination   [] Blood in urine Skin:  [] Rashes   [] Ulcers   [] Wounds Psychological:  [] History of anxiety   []  History of major depression.  Physical Examination  Vitals:   07/03/22 2315 07/03/22 2325 07/03/22 2330 07/03/22 2335  BP: (!) 74/52 (!) 114/59 (!) 64/36 (!) 83/62  Pulse: 85 86 90 88  Resp: (!) 21 (!) 28 (!) 28 (!) 28  Temp:  98.4 F (36.9 C)    TempSrc:  Oral    SpO2: (!) 88% 100% 100% 100%  Weight:      Height:       Body mass index is 34.84 kg/m. Gen:  Confused, NAD Head: [place]/AT, No temporalis wasting. Prominent temp pulse not noted. Ear/Nose/Throat: Hearing grossly intact, nares w/o erythema or drainage, oropharynx w/o Erythema/Exudate Eyes: Sclera non-icteric, conjunctiva clear Neck: Trachea midline.  No JVD.  Pulmonary:  Good air movement, respirations not labored, equal bilaterally.  Cardiac: RRR, normal S1, S2. Vascular:  Vessel Right Left  Radial Palpable Palpable   Gastrointestinal: soft, non-tender/non-distended. No guarding/reflex.  Musculoskeletal: M/S 5/5 throughout.  Extremities without ischemic changes.  No deformity or atrophy. No edema. Neurologic: Sensation grossly intact in extremities.  Symmetrical.  Speech is fluent. Motor exam as listed above. Psychiatric: Judgment intact, Mood & affect appropriate for pt's clinical situation. Dermatologic: No rashes or ulcers noted.  No cellulitis or open wounds. Lymph : No Cervical, Axillary, or Inguinal lymphadenopathy.    CBC Lab Results  Component Value  Date    WBC 19.5 (H) 07/03/2022   HGB 6.0 (L) 07/03/2022   HCT 17.3 (L) 07/03/2022   MCV 83.0 07/03/2022   PLT 124 (L) 07/03/2022    BMET    Component Value Date/Time   NA 133 (L) 07/03/2022 0353   K 4.1 07/03/2022 0353   CL 98 07/03/2022 0353   CO2 27 07/03/2022 0353   GLUCOSE 103 (H) 07/03/2022 0353   BUN 96 (H) 07/03/2022 0353   CREATININE 6.08 (H) 07/03/2022 0353   CALCIUM 8.1 (L) 07/03/2022 0353   GFRNONAA 7 (L) 07/03/2022 0353   GFRAA 8 (L) 12/07/2015 1133   Estimated Creatinine Clearance: 8.3 mL/min (A) (by C-G formula based on SCr of 6.08 mg/dL (H)).  COAG Lab Results  Component Value Date   INR 2.2 (H) 07/03/2022   INR 1.3 (H) 06/19/2022   INR 0.9 08/14/2020    Radiology CT HEAD WO CONTRAST (5MM)  Result Date: 07/03/2022 CLINICAL DATA:  Initial evaluation for mental status change, unknown cause. EXAM: CT HEAD WITHOUT CONTRAST TECHNIQUE: Contiguous axial images were obtained from the base of the skull through the vertex without intravenous contrast. RADIATION DOSE REDUCTION: This exam was performed according to the departmental dose-optimization program which includes automated exposure control, adjustment of the mA and/or kV according to patient size and/or use of iterative reconstruction technique. COMPARISON:  Prior study from 06/25/2022. FINDINGS: Brain: Cerebral volume within normal limits for age. Patchy multifocal hypodensities involving the supratentorial cerebral white matter, consistent with chronic small vessel ischemic disease. No acute intracranial hemorrhage. No acute large vessel territory infarct. No mass lesion, midline shift or mass effect. No hydrocephalus or extra-axial fluid collection. Vascular: No abnormal hyperdense vessel. Calcified atherosclerosis present at the skull base. Skull: Scalp soft tissues and calvarium within normal limits. Sinuses/Orbits: Question mild left periorbital soft tissue swelling, of uncertain significance (series 4, image 7). Globes  and orbital soft tissues demonstrate no other acute finding. Chronic left sphenoid sinusitis noted. Paranasal sinuses are otherwise clear. Trace left mastoid effusion, of doubtful significance. Other: None. IMPRESSION: 1. No acute intracranial abnormality. 2. Moderate chronic small vessel ischemic disease. 3. Question mild left periorbital soft tissue swelling, of uncertain significance. Correlation with physical exam recommended. 4. Chronic left sphenoid sinusitis. Electronically Signed   By: Jeannine Boga M.D.   On: 07/03/2022 23:03   [place] Lower Ext Art Right Ltd  Result Date: 07/03/2022 CLINICAL DATA:  76 year old female with history of hematoma after coronary catheterization on 07/03/2022 EXAM: Limited RIGHT LOWER EXTREMITY ARTERIAL DUPLEX SCAN TECHNIQUE: Gray-scale sonography as well as color Doppler and duplex ultrasound was performed to evaluate the lower extremity arteries including the common, superficial and profunda femoral arteries, popliteal artery and calf arteries. COMPARISON:  CTA abdomen pelvis from 07/03/2022. FINDINGS: About the anterior aspect of the right common femoral artery is a partially thrombosed pseudoaneurysm measuring approximately 1.9 x 0.4 x 1.3 cm. Color Doppler demonstrates central patency. There is a short neck measuring approximately 2.9 mm in length, 2.0 mm in with. The common femoral artery appears patent proximal and distal to the pseudoaneurysm. IMPRESSION: Access site pseudoaneurysm measuring up to 1.9 cm just anterior to the right common femoral artery. [name], MD [facility] Radiology Electronically Signed   By: [name] M.D.   On: 07/03/2022 16:26   DG Chest Port 1 View  Result Date: 07/03/2022 CLINICAL DATA:  Acute respiratory failure. EXAM: PORTABLE CHEST 1 VIEW COMPARISON:  Radiographs 06/30/2022 and 06/25/2022. FINDINGS:  1223 hours. Right-sided PICC previously projected to the level the right  atrium; this line now demonstrates acute angulation at the level of the azygous vein and projects to the left, likely within the left brachiocephalic vein. Stable cardiomegaly and aortic atherosclerosis. There is mild vascular congestion with interval improved aeration of the right lung base. No consolidation, pneumothorax or significant pleural effusion identified. Avascular stent is noted in the left axilla. The bones appear unremarkable. Telemetry leads overlie the chest. IMPRESSION: 1. Interval improved aeration of the right lung base. 2. Interval angulation of the right-sided PICC, tip now likely within the left brachiocephalic vein. 3. Cardiomegaly and mild vascular congestion. Electronically Signed   By: Richardean Sale M.D.   On: 07/03/2022 12:37   CT ANGIO GI BLEED  Result Date: 07/03/2022 CLINICAL DATA:  Mesenteric ischemia, acute EXAM: CTA ABDOMEN AND PELVIS WITHOUT AND WITH CONTRAST TECHNIQUE: Multidetector CT imaging of the abdomen and pelvis was performed using the standard protocol during bolus administration of intravenous contrast. Multiplanar reconstructed images and MIPs were obtained and reviewed to evaluate the vascular anatomy. RADIATION DOSE REDUCTION: This exam was performed according to the departmental dose-optimization program which includes automated exposure control, adjustment of the mA and/or kV according to patient size and/or use of iterative reconstruction technique. CONTRAST:  117mL OMNIPAQUE IOHEXOL 350 MG/ML SOLN COMPARISON:  None Available. FINDINGS: VASCULAR Aorta: Aortoiliac atherosclerosis.  No aneurysm or dissection. Celiac: Mild stenosis at the celiac origin. SMA: Patent without significant stenosis.  Mixed atherosclerosis. Renals: Patent without significant stenosis.  Mixed atherosclerosis. IMA: Patent without significant stenosis. Inflow: Mixed atherosclerosis. No significant stenosis. No aneurysm or dissection. Proximal Outflow: Mixed atherosclerosis without  significant stenosis. There is a focal outpouching of the anterior right common femoral artery with a connecting neck, which does not significantly change on delay. This measures approximately 6 x 4 mm (series 6, image 68). There is adjacent soft tissue thickening/possible hematoma (series 5, image 168). Veins: No obvious venous abnormality. Review of the MIP images confirms the above findings. NON-VASCULAR Lower chest: Cardiomegaly. Trace pleural effusions. Lingular and right basilar atelectasis. Hepatobiliary: No focal liver abnormality. The gallbladder is decompressed with tiny layering internal stones and mild wall thickening. Pancreas: No ductal dilation or peripancreatic inflammatory change. Spleen: Unremarkable. Adrenals/Urinary Tract: Mild left adrenal thickening without discrete nodule. Right adrenal gland is normal. Atrophic left kidney. Multiple renal cysts bilaterally in keeping with history of end-stage renal disease on dialysis. The bladder is mildly distended. Stomach/Bowel: The stomach is within normal limits. There is no evidence of bowel obstruction. There is no evidence of active gastrointestinal bleed. Study is somewhat limited by the presence of hyperattenuating material throughout the transverse and proximal descending colon. Sigmoid diverticulosis. Lymphatic: No lymphadenopathy. Reproductive: Prior hysterectomy. Other: No ascites.  No free air. Musculoskeletal: No acute osseous abnormality. No suspicious osseous lesion. Multilevel degenerative changes of the spine. IMPRESSION: No CT evidence of active gastrointestinal bleed. Focal outpouching of the anterior right common femoral artery with adjacent soft tissue thickening/possible small hematoma. This could represent a pseudoaneurysm. Recommend targeted ultrasound. No other acute findings in the abdomen or pelvis. Trace pleural effusions.  Cardiomegaly. Electronically Signed   By: Maurine Simmering M.D.   On: 07/03/2022 11:41   CARDIAC  CATHETERIZATION  Result Date: 06/17/2022   Prox RCA lesion is 30% stenosed.   Dist Cx lesion is 30% stenosed.   Dist LAD lesion is 30% stenosed.   The left ventricular systolic function is normal.   The left ventricular ejection  fraction is 50-55% by visual estimate.   DG Chest 1 View  Result Date: 06/30/2022 CLINICAL DATA:  Short of breath.  Follow-up study.  Inpatient. EXAM: CHEST  1 VIEW COMPARISON:  06/25/2022 and older exams. FINDINGS: Stable enlargement of the cardiac silhouette. Low lung volumes. Mild opacity at both lung bases consistent with atelectasis. Remainder of the lungs is clear. No pneumothorax. Right-sided PICC, tip projecting near the caval atrial junction, new since the prior study. IMPRESSION: 1. No acute findings.  No current evidence of pulmonary edema. 2. Mild lung base opacities consistent with atelectasis. No convincing pneumonia. 3. Stable cardiomegaly. Electronically Signed   By: Lajean Manes M.D.   On: 06/30/2022 11:03   [place] EKG SITE RITE  Result Date: 06/27/2022 If Site Rite image not attached, placement could not be confirmed due to current cardiac rhythm.  CT ANGIO HEAD NECK W WO CM  Result Date: 06/25/2022 CLINICAL DATA:  Provided history: Change in mental status. Additional history provided: sncopal episode last night. EXAM: CT ANGIOGRAPHY HEAD AND NECK TECHNIQUE: Multidetector CT imaging of the head and neck was performed using the standard protocol during bolus administration of intravenous contrast. Multiplanar CT image reconstructions and MIPs were obtained to evaluate the vascular anatomy. Carotid stenosis measurements (when applicable) are obtained utilizing NASCET criteria, using the distal internal carotid diameter as the denominator. RADIATION DOSE REDUCTION: This exam was performed according to the departmental dose-optimization program which includes automated exposure control, adjustment of the mA and/or kV according to patient size and/or use of  iterative reconstruction technique. CONTRAST:  20mL OMNIPAQUE IOHEXOL 350 MG/ML SOLN COMPARISON:  No pertinent prior exams available for comparison. FINDINGS: CT HEAD FINDINGS Brain: Mild generalized cerebral atrophy. Moderate patchy and ill-defined hypoattenuation within the cerebral white matter, nonspecific but compatible with chronic small vessel disease. Small chronic lacunar infarct within the left caudate head. There is no acute intracranial hemorrhage. No demarcated cortical infarct. No extra-axial fluid collection. No evidence of an intracranial mass. No midline shift. Vascular: No hyperdense vessel. Atherosclerotic calcifications. Skull: No fracture or aggressive osseous lesion. Sinuses/Orbits: No mass or acute finding within the imaged orbits. Fluid level, and background mild mucosal thickening, within the left sphenoid sinus. Review of the MIP images confirms the above findings CTA NECK FINDINGS Aortic arch: Common origin of the innominate left common carotid arteries. Atherosclerotic plaque within the visualized aortic arch and proximal major branch vessels of the neck. Streak and beam hardening artifact arising from a dense right-sided contrast bolus partially obscures the right subclavian artery. Within this limitation, there is no appreciable hemodynamically significant innominate or proximal subclavian artery stenosis. Right carotid system: CCA and ICA patent within the neck without hemodynamically significant stenosis (50% or greater). Atherosclerotic plaque at the CCA origin, within the proximal CCA, about the carotid bifurcation and within the proximal ICA. Left carotid system: CCA and ICA patent within the neck without hemodynamically significant stenosis (50% or greater). Atherosclerotic plaque at the CCA origin, about the carotid bifurcation and within the proximal ICA. Vertebral arteries: The dominant right vertebral artery is patent within the neck. Mild atherosclerotic narrowing at the  origin of this vessel. The non dominant left vertebral artery is developmentally diminutive, but patent throughout the neck. Skeleton: Cervical spondylosis. Facet joint ankylosis on the right at C4-C5. No acute fracture or aggressive osseous lesion. Other neck: Calcified nodules within the bilateral thyroid lobes, measuring up to 13 mm, not meeting consensus criteria for ultrasound follow-up based on size. No follow-up imaging is recommended.  Thyroid reference no cervical lymphadenopathy. Upper chest: No consolidation within the imaged lung apices. Emphysema. Review of the MIP images confirms the above findings CTA HEAD FINDINGS Anterior circulation: The intracranial internal carotid arteries are patent. Atherosclerotic plaque within both vessels. Mild to moderate stenosis within the right paraclinoid segment. No more than mild atherosclerotic narrowing of the intracranial left ICA The M1 middle cerebral arteries are patent. No M2 proximal branch occlusion or high-grade proximal stenosis. The anterior cerebral arteries are patent. No intracranial aneurysm is identified. Posterior circulation: The dominant cranial right vertebral artery is patent. Nonstenotic atherosclerotic plaque within this vessel. Severe stenosis within the proximal left V4 segment. The basilar artery is patent. The posterior cerebral arteries are patent. Hypoplastic P1 segment with sizable posterior communicating arteries, bilaterally. Venous sinuses: Within the limitations of contrast timing, no convincing thrombus. Anatomic variants: As described Review of the MIP images confirms the above findings IMPRESSION: CT head: 1. No evidence of acute intracranial abnormality. 2. Moderate chronic small ischemic disease within the cerebral white matter. 3. Small chronic lacunar infarct within the left caudate head. 4. Mild generalized cerebral atrophy. 5. Left sphenoid sinusitis. CTA neck: 1. The common carotid and internal carotid arteries are patent  within the neck without hemodynamically significant stenosis. Atherosclerotic plaque, bilaterally. 2. Vertebral arteries patent within the neck. Mild atherosclerotic narrowing at the origin of the dominant right vertebral artery. 3. Aortic Atherosclerosis (ICD10-I70.0) and Emphysema (ICD10-J43.9). CTA head: 1. No intracranial large vessel occlusion is identified. 2. Intracranial atherosclerotic disease with multifocal stenoses, most notably as follows. 3. Severe stenosis within the left vertebral artery proximal V4 segment. 4. Mild-to-moderate stenosis within the paraclinoid right ICA. Electronically Signed   By: Kellie Simmering D.O.   On: 06/25/2022 12:39   DG Chest Port 1 View  Result Date: 06/25/2022 CLINICAL DATA:  Shortness of breath EXAM: PORTABLE CHEST 1 VIEW COMPARISON:  Chest x-ray June 23, 2022 FINDINGS: Unchanged cardiomediastinal contours including cardiomegaly. Slightly improved aeration of bibasilar lungs. Persistent bilateral reticular and interstitial pulmonary opacities. Probable small bilateral pleural effusions. No large pneumothorax. The visualized upper abdomen is unremarkable. No acute osseous abnormality. IMPRESSION: 1. Slightly improved aeration of bilateral lungs with persistent interstitial pulmonary opacities, most consistent with pulmonary edema. 2. Small bilateral pleural effusions. 3. Cardiomegaly. Electronically Signed   By: Beryle Flock M.D.   On: 06/25/2022 11:42   ECHOCARDIOGRAM COMPLETE BUBBLE STUDY  Result Date: 06/24/2022    ECHOCARDIOGRAM REPORT   Patient Name:   Kathleen Sharp Salv[place] Date of Exam: 06/24/2022 Medical Rec #:  [id number]       Height:       62.0 in Accession #:    [id number]      Weight:       186.5 lb Date of Birth:  Nov 28, 1945        BSA:          1.856 m Patient Age:    68 years        BP:           125/85 mmHg Patient Gender: F               HR:           72 bpm. Exam Location:  [facility] Procedure: 2D Echo, Cardiac Doppler, Color Doppler and Saline Contrast  Bubble            Study Indications:     Syncope 780.2 / R55  History:  Patient has no prior history of Echocardiogram examinations.                  Risk Factors:Hypertension. CKD.  Sonographer:     Sherrie Sport Referring Phys:  [id number] AMY N COX Diagnosing Phys: Yolonda Kida MD  Sonographer Comments: Technically challenging study due to limited acoustic windows and no apical window. IMPRESSIONS  1. Negative bubble study.  2. Left ventricular ejection fraction, by estimation, is 55 to 60%. The left ventricle has normal function. The left ventricle has no regional wall motion abnormalities. There is moderate concentric left ventricular hypertrophy. Left ventricular diastolic function could not be evaluated.  3. Right ventricular systolic function is normal. The right ventricular size is normal.  4. The mitral valve is normal in structure. Trivial mitral valve regurgitation.  5. The aortic valve is calcified. There is severe calcifcation of the aortic valve. There is severe thickening of the aortic valve. Aortic valve regurgitation is mild. Aortic valve sclerosis/calcification is present, without any evidence of aortic stenosis. FINDINGS  Left Ventricle: Left ventricular ejection fraction, by estimation, is 55 to 60%. The left ventricle has normal function. The left ventricle has no regional wall motion abnormalities. The left ventricular internal cavity size was normal in size. There is  moderate concentric left ventricular hypertrophy. Left ventricular diastolic function could not be evaluated. Right Ventricle: The right ventricular size is normal. No increase in right ventricular wall thickness. Right ventricular systolic function is normal. Left Atrium: Left atrial size was normal in size. Right Atrium: Right atrial size was normal in size. Pericardium: There is no evidence of pericardial effusion. Mitral Valve: The mitral valve is normal in structure. There is mild thickening of the mitral valve  leaflet(s). There is mild calcification of the mitral valve leaflet(s). Normal mobility of the mitral valve leaflets. Trivial mitral valve regurgitation. Tricuspid Valve: The tricuspid valve is grossly normal. Tricuspid valve regurgitation is mild. Aortic Valve: The aortic valve is calcified. There is severe calcifcation of the aortic valve. There is severe thickening of the aortic valve. There is moderate to severe aortic valve annular calcification. Aortic valve regurgitation is mild. Aortic valve sclerosis/calcification is present, without any evidence of aortic stenosis. Pulmonic Valve: The pulmonic valve was normal in structure. Pulmonic valve regurgitation is not visualized. Aorta: The ascending aorta was not well visualized. IAS/Shunts: No atrial level shunt detected by color flow Doppler. Agitated saline contrast was given intravenously to evaluate for intracardiac shunting. Additional Comments: Negative bubble study.  LEFT VENTRICLE PLAX 2D LVIDd:         4.20 cm LVIDs:         3.00 cm LV PW:         1.50 cm LV IVS:        1.70 cm LVOT diam:     2.00 cm LVOT Area:     3.14 cm  LEFT ATRIUM         Index LA diam:    3.60 cm 1.94 cm/m                        PULMONIC VALVE AORTA                 PR End Diast Vel: 9.73 msec Ao Root diam: 2.70 cm  TRICUSPID VALVE TR Peak grad:   42.8 mmHg TR Vmax:        327.00 cm/s  SHUNTS Systemic Diam: 2.00 cm Yolonda Kida MD  Electronically signed by Yolonda Kida MD Signature Date/Time: 06/24/2022/8:13:11 PM    Final    DG Chest Portable 1 View  Result Date: 06/28/2022 CLINICAL DATA:  Shortness of breath EXAM: PORTABLE CHEST 1 VIEW COMPARISON:  Multiple chest x-rays, most recently August 14, 2020 FINDINGS: Cardiomegaly. Unchanged mediastinal contours including calcified atherosclerosis of the aortic arch. Diffuse bilateral interstitial pulmonary opacities. Moderate left and small right pleural effusions. No large pneumothorax. No acute osseous abnormality. The  visualized upper abdomen is unremarkable. IMPRESSION: 1. Moderate pulmonary edema. 2. Moderate left and small right pleural effusions. 3. Cardiomegaly. Electronically Signed   By: Beryle Flock M.D.   On: 07/02/2022 16:41      Assessment/Plan 1.  Right common femoral artery pseudoaneurysm  Noninvasive studies showed a right common femoral artery pseudoaneurysm secondary to recent heart catheterization.  We will plan for possible thrombin injection based on size and location however if this is not possible we will plan on covered stent placement with angiogram.  I discussed both options with the family including risk, benefits and alternatives and they are agreeable to proceed.   Plan of care discussed with Dr. Lucky Cowboy and he is in agreement with plan noted above.   Family Communication: Discussed with nephew and niece, Derwood Kaplan (designated power of attorney) at bedside  Total Time: 85 minutes I spent 85 minutes in this encounter including personally reviewing extensive medical records, personally reviewing imaging studies and compared to prior scans, counseling the patient, placing orders, coordinating care and performing appropriate documentation  Thank you for allowing [place] to participate in the care of this patient.   Kris Hartmann, NP [facility] [phone] (Office Phone) [phone] (Office Fax) [phone] (Pager)  07/14/2022 12:28 AM  Staff may message me via secure chat in [place]  but this may not receive immediate response,  please page for urgent matters!  Dictation software was used to generate the above note. Typos may occur and escape review, as with typed/written notes. Any error is purely unintentional.  Please contact me directly for clarity if needed.

## 2022-07-04 NOTE — Progress Notes (Signed)
Patient alert, normal sinus, on 2L [place], no complaints of pain. Patient went to procedures, no complications. Pulses bilateral radial 2+, dorsal 1+. Patient femoral site started oozing. Pressure applied. MD notified. Oozing stopped, will continue to monitor.   MDs aware of BP readings.   Patient active in bed, additional oozing at site. Marked, reported to RN in shift report. Vascular made aware. Will continue to monitor.

## 2022-07-04 NOTE — Progress Notes (Signed)
SUBJECTIVE: Patient is a 76 year old female with a history of ESRD on dialysis MWF, hypertension, cardiomegaly, hyperlipidemia, who presented to the ED on 07/07/2022 with shortness of breath, palpitations. Denied chest pain at admission.     On 10/10, patient had an episode of nonsustained ventricular tachycardia followed by atrial fibrillation associated with dizziness. Patient denies losing consciousness. IV amiodoarone started.    Cardiac cath rescheduled for 10/12, however, patient developed mental status changes, cath was cancelled.    On 10/15 overnight patient became unresponsive, bradycardic, hypotensive. IV amiodarone discontinued. Patient transferred to ICU.    Cardiac cath 07/14/2022 revealed minor luminal irregularities about 30% in LAD circumflex and RCA with no significant coronary artery disease.  LVEF on echo was 55% thus LV gram was deferred   Acute anemia- unclear etiology, Hgb now 6.3. Patient received one unit PRBC. Patient having CT abdomen pelvis this morning.    CT abdomen pelvis on 07/03/22 revealed possible pseudoaneurysm. Ultrasound confirmed a partially thrombosed pseudoaneurysm measuring approximately 1.9 x 0.4 x 1.3 cm.    Vitals:   07/10/2022 0545 06/27/2022 0603 07/13/2022 0750 07/07/2022 0815  BP: (!) 67/57 92/71 127/74 (!) 82/38  Pulse: 78 80 86 86  Resp: (!) 23 18 17  (!) 25  Temp:   97.8 F (36.6 C) 97.6 F (36.4 C)  TempSrc:   Oral   SpO2: 100% 100% 100%   Weight:      Height:        Intake/Output Summary (Last 24 hours) at 06/18/2022 0832 Last data filed at 06/26/2022 0600 Gross per 24 hour  Intake 885.15 ml  Output 400 ml  Net 485.15 ml    LABS: Basic Metabolic Panel: Recent Labs    07/03/22 0353 06/30/2022 0540  NA 133* 133*  K 4.1 3.5  CL 98 95*  CO2 27 27  GLUCOSE 103* 144*  BUN 96* 80*  CREATININE 6.08* 4.26*  CALCIUM 8.1* 7.8*  MG 1.9 1.6*  PHOS 2.7 2.9   Liver Function Tests: No results for input(s): "AST", "ALT", "ALKPHOS",  "BILITOT", "PROT", "ALBUMIN" in the last 72 hours. No results for input(s): "LIPASE", "AMYLASE" in the last 72 hours. CBC: Recent Labs    07/03/22 0353 07/03/22 1153 07/03/22 1800 06/25/2022 0134 07/06/2022 0540  WBC 18.5* 19.5*  --   --  27.4*  NEUTROABS 12.2*  --   --   --  20.7*  HGB 6.3* 7.1*   < > 7.6* 7.1*  HCT 19.9* 21.9*   < > 22.2* 21.0*  MCV 81.6 83.0  --   --  82.7  PLT 141* 124*  --   --  125*   < > = values in this interval not displayed.   Cardiac Enzymes: No results for input(s): "CKTOTAL", "CKMB", "CKMBINDEX", "TROPONINI" in the last 72 hours. BNP: Invalid input(s): "POCBNP" D-Dimer: No results for input(s): "DDIMER" in the last 72 hours. Hemoglobin A1C: No results for input(s): "HGBA1C" in the last 72 hours. Fasting Lipid Panel: No results for input(s): "CHOL", "HDL", "LDLCALC", "TRIG", "CHOLHDL", "LDLDIRECT" in the last 72 hours. Thyroid Function Tests: No results for input(s): "TSH", "T4TOTAL", "T3FREE", "THYROIDAB" in the last 72 hours.  Invalid input(s): "FREET3" Anemia Panel: No results for input(s): "VITAMINB12", "FOLATE", "FERRITIN", "TIBC", "IRON", "RETICCTPCT" in the last 72 hours.   PHYSICAL EXAM General: Well developed, well nourished, in no acute distress HEENT:  Normocephalic and atramatic Neck:  No JVD.  Lungs: Clear bilaterally to auscultation and percussion. Heart: HRRR . Normal S1 and  S2 without gallops or murmurs.  Abdomen: Bowel sounds are positive, abdomen soft and non-tender  Msk:  Back normal, normal gait. Normal strength and tone for age. Extremities: No clubbing, cyanosis or edema.   Neuro: Alert and oriented X 3. Psych:  Good affect, responds appropriately  TELEMETRY: sinus rhythm  ASSESSMENT AND PLAN: Patient currently resting in bed. Denies chest pain. Shortness of breath unchanged. Denies right groin pain. Right common femoral artery pseudoaneurysm post cardiac cath on 07/10/2022. Patient scheduled for intervention today with  vein and vascular.   Principal Problem:   NSTEMI (non-ST elevated myocardial infarction) ([place]) Active Problems:   Anemia of chronic renal failure, stage 5 (HCC)   End stage renal disease (HCC)   Essential hypertension   Volume overload   Hyperkalemia   Pulmonary edema   Cardiac arrhythmia   Syncope and collapse   Metabolic encephalopathy   Anemia, posthemorrhagic, acute   Pseudoaneurysm following procedure ([place])    Bentley Fissel, FNP-C 07/05/2022 8:32 AM

## 2022-07-04 NOTE — Interval H&P Note (Signed)
History and Physical Interval Note:  06/29/2022 10:17 AM  Kathleen Sharp  has presented today for surgery, with the diagnosis of Pseudoaneurysm.  The various methods of treatment have been discussed with the patient and family. After consideration of risks, benefits and other options for treatment, the patient has consented to  Procedure(s): Lower Extremity Angiography (Right) as a surgical intervention.  The patient's history has been reviewed, patient examined, no change in status, stable for surgery.  I have reviewed the patient's chart and labs.  Questions were answered to the patient's satisfaction.     Leotis Pain

## 2022-07-04 NOTE — H&P (View-Only) (Signed)
Kathleen Sharp Note  MRN : [id number]  Kathleen Sharp is a 76 y.o. (1945-10-30) female who presents with chief complaint of  Chief Complaint  Patient presents with   Shortness of Breath  .   Consulting Physician:Ankit Reesa Chew, MD Reason for Sharp: Right common femoral artery pseudoaneurysm History of Present Illness: Kathleen Sharp is a 76 year old female with a previous medical history of end-stage renal disease, hypertension, hyperlipidemia and cardiomegaly that presented to [facility] with shortness of breath.  The patient was found to be an acute respiratory failure in the setting of pulmonary edema and developed SVT and ultimately had an NSTEMI.  The patient also presented with hyperkalemia as well.  Cardiac catheterization was recommended however the patient initially declined.  The patient had several other episodes of arrhythmias and developed altered mental status.  She ultimately underwent cardiac catheterization on 06/20/2022.  The patient then subsequently began to develop anemia without any overt signs of bleeding.  The patient underwent a CTA to evaluate for GI bleed but no evidence of active GI bleed was seen however there was the presence of a focal outpouching concerning for pseudoaneurysm.  Subsequent duplex confirmed presence of a right common femoral artery pseudoaneurysm.  Current Facility-Administered Medications  Medication Dose Route Frequency Provider Last Rate Last Admin   0.9 %  sodium chloride infusion (Manually program via Guardrails IV Fluids)   Intravenous Once Pudota, Kathe Becton, MD       0.9 %  sodium chloride infusion  250 mL Intravenous PRN Dionisio David, MD       acetaminophen (TYLENOL) tablet 650 mg  650 mg Oral Q4H PRN Neoma Laming A, MD       atorvastatin (LIPITOR) tablet 20 mg  20 mg Oral q1800 Cox, Amy N, DO   20 mg at 07/02/22 1802   atropine 1 MG/10ML injection 0.5 mg  0.5 mg Intravenous  Once Sharion Settler, NP       Chlorhexidine Gluconate Cloth 2 % PADS 6 each  6 each Topical Q0600 Cox, Amy N, DO   6 each at 07/03/22 0700   dextrose 10 % infusion   Intravenous Continuous Rust-Chester, Huel Cote, NP 10 mL/hr at 07/03/22 2300 Infusion Verify at 07/03/22 2300   fentaNYL (SUBLIMAZE) injection 12.5 mcg  12.5 mcg Intravenous Q10 min PRN Rust-Chester, Huel Cote, NP       guaiFENesin (ROBITUSSIN) 100 MG/5ML liquid 5 mL  5 mL Oral Q4H PRN Teressa Lower, NP   5 mL at 07/03/22 0350   haloperidol (HALDOL) tablet 2 mg  2 mg Oral Q6H PRN Flora Lipps, MD       hydrALAZINE (APRESOLINE) injection 10 mg  10 mg Intravenous Q4H PRN Amin, Ankit Chirag, MD       ipratropium-albuterol (DUONEB) 0.5-2.5 (3) MG/3ML nebulizer solution 3 mL  3 mL Nebulization Q4H PRN Amin, Ankit Chirag, MD   3 mL at 07/03/22 1215   metoprolol tartrate (LOPRESSOR) injection 5 mg  5 mg Intravenous Q4H PRN Amin, Ankit Chirag, MD       norepinephrine (LEVOPHED) 16 mg in 250mL (0.064 mg/mL) premix infusion  0-40 mcg/min Intravenous Titrated Rust-Chester, Britton L, NP 3.75 mL/hr at 07/03/22 2300 4 mcg/min at 07/03/22 2300   ondansetron (ZOFRAN) injection 4 mg  4 mg Intravenous Q6H PRN Amin, Ankit Chirag, MD       oxyCODONE (Oxy IR/ROXICODONE) immediate release tablet 5 mg  5 mg Oral Q4H PRN Amin, Ankit  Chirag, MD       pantoprazole (PROTONIX) injection 40 mg  40 mg Intravenous Q12H Amin, Ankit Chirag, MD   40 mg at 07/03/22 2111   senna-docusate (Senokot-S) tablet 1 tablet  1 tablet Oral QHS PRN Amin, Ankit Chirag, MD       sevelamer carbonate (RENVELA) tablet 1,600 mg  1,600 mg Oral TID WC Cox, Amy N, DO   1,600 mg at 06/30/22 1337   sodium chloride flush (NS) 0.9 % injection 10-40 mL  10-40 mL Intracatheter Q12H Pudota, Kathe Becton, MD   10 mL at 07/03/22 2111   sodium chloride flush (NS) 0.9 % injection 10-40 mL  10-40 mL Intracatheter PRN Pudota, Kathe Becton, MD       sodium chloride flush (NS) 0.9 % injection 3 mL  3 mL  Intravenous Q12H Scoggins, Amber, NP   3 mL at 07/03/22 1031   sodium chloride flush (NS) 0.9 % injection 3 mL  3 mL Intravenous Q12H Scoggins, Amber, NP   3 mL at 07/03/22 1030   sodium chloride flush (NS) 0.9 % injection 3 mL  3 mL Intravenous Q12H Neoma Laming A, MD   3 mL at 07/03/22 1030   sodium chloride flush (NS) 0.9 % injection 3 mL  3 mL Intravenous PRN Dionisio David, MD       Facility-Administered Medications Ordered in Other Encounters  Medication Dose Route Frequency Provider Last Rate Last Admin   epoetin alfa (EPOGEN,PROCRIT) injection 10,000 Units  10,000 Units Subcutaneous Once Lequita Asal, MD        Past Medical History:  Diagnosis Date   Anemia    Arthritis    Chronic kidney disease    Chronic Kidney Disease   Hypercholesterolemia    Hypertension     Past Surgical History:  Procedure Laterality Date   ABDOMINAL HYSTERECTOMY     AV FISTULA PLACEMENT Left 12/15/2015   Procedure: INSERTION OF ARTERIOVENOUS (AV) GORE-TEX GRAFT ARM             ( BRACHIAL AXILLARY GRAFT );  Surgeon: [name], MD;  Location: [facility];  Service: Vascular;  Laterality: Left;   CAPD INSERTION N/A 11/24/2015   Procedure: Exploratory laparoscopy;  Surgeon: [name], MD;  Location: [facility];  Service: Vascular;  Laterality: N/A;, attempted insertion but unable due to scar tissue   LEFT HEART CATH AND CORONARY ANGIOGRAPHY N/A 07/09/2022   Procedure: LEFT HEART CATH AND CORONARY ANGIOGRAPHY;  Surgeon: Dionisio David, MD;  Location: [place] [facility];  Service: Cardiovascular;  Laterality: N/A;   PERIPHERAL VASCULAR CATHETERIZATION Left 02/27/2016   Procedure: Thrombectomy;  Surgeon: [name], MD;  Location: [place] [facility];  Service: Cardiovascular;  Laterality: Left;   PERIPHERAL VASCULAR CATHETERIZATION Left 04/12/2016   Procedure: Thrombectomy;  Surgeon: [name], MD;  Location: [place] [facility];  Service: Cardiovascular;  Laterality:  Left;   PERIPHERAL VASCULAR CATHETERIZATION Left 05/22/2016   Procedure: Thrombectomy;  Surgeon: [name], MD;  Location: [place] [facility];  Service: Cardiovascular;  Laterality: Left;   PERIPHERAL VASCULAR CATHETERIZATION N/A 05/22/2016   Procedure: A/V Shuntogram/Fistulagram;  Surgeon: [name], MD;  Location: [place] [facility];  Service: Cardiovascular;  Laterality: N/A;   PERIPHERAL VASCULAR CATHETERIZATION Left 06/21/2016   Procedure: Thrombectomy;  Surgeon: [name], MD;  Location: [place] [facility];  Service: Cardiovascular;  Laterality: Left;    Social History Social History   Tobacco  Use   Smoking status: Former    Packs/day: 1.00    Years: 15.00    Total pack years: 15.00    Types: Cigarettes    Quit date: 09/16/2012    Years since quitting: 9.8   Smokeless tobacco: Never  Substance Use Topics   Alcohol use: No    Alcohol/week: 0.0 standard drinks of alcohol   Drug use: No    Family History Family History  Problem Relation Age of Onset   Kidney disease Father    Heart disease Mother    Kidney disease Brother     No Known Allergies   REVIEW OF SYSTEMS (Negative unless checked)  Constitutional: [] Weight loss  [] Fever  [] Chills Cardiac: [] Chest pain   [] Chest pressure   [] Palpitations   [] Shortness of breath when laying flat   [] Shortness of breath at rest   [] Shortness of breath with exertion. Vascular:  [] Pain in legs with walking   [] Pain in legs at rest   [] Pain in legs when laying flat   [] Claudication   [] Pain in feet when walking  [] Pain in feet at rest  [] Pain in feet when laying flat   [] History of DVT   [] Phlebitis   [] Swelling in legs   [] Varicose veins   [] Non-healing ulcers Pulmonary:   [] Uses home oxygen   [] Productive cough   [] Hemoptysis   [] Wheeze  [] COPD   [] Asthma Neurologic:  [] Dizziness  [] Blackouts   [] Seizures   [] History of stroke   [] History of TIA  [] Aphasia   [] Temporary blindness   [] Dysphagia   [] Weakness or  numbness in arms   [] Weakness or numbness in legs Musculoskeletal:  [] Arthritis   [] Joint swelling   [] Joint pain   [] Low back pain Hematologic:  [] Easy bruising  [] Easy bleeding   [] Hypercoagulable state   [] Anemic  [] Hepatitis Gastrointestinal:  [] Blood in stool   [] Vomiting blood  [] Gastroesophageal reflux/heartburn   [] Difficulty swallowing. Genitourinary:  [] Chronic kidney disease   [] Difficult urination  [] Frequent urination  [] Burning with urination   [] Blood in urine Skin:  [] Rashes   [] Ulcers   [] Wounds Psychological:  [] History of anxiety   []  History of major depression.  Physical Examination  Vitals:   07/03/22 2315 07/03/22 2325 07/03/22 2330 07/03/22 2335  BP: (!) 74/52 (!) 114/59 (!) 64/36 (!) 83/62  Pulse: 85 86 90 88  Resp: (!) 21 (!) 28 (!) 28 (!) 28  Temp:  98.4 F (36.9 C)    TempSrc:  Oral    SpO2: (!) 88% 100% 100% 100%  Weight:      Height:       Body mass index is 34.84 kg/m. Gen:  Confused, NAD Head: [place]/AT, No temporalis wasting. Prominent temp pulse not noted. Ear/Nose/Throat: Hearing grossly intact, nares w/o erythema or drainage, oropharynx w/o Erythema/Exudate Eyes: Sclera non-icteric, conjunctiva clear Neck: Trachea midline.  No JVD.  Pulmonary:  Good air movement, respirations not labored, equal bilaterally.  Cardiac: RRR, normal S1, S2. Vascular:  Vessel Right Left  Radial Palpable Palpable   Gastrointestinal: soft, non-tender/non-distended. No guarding/reflex.  Musculoskeletal: M/S 5/5 throughout.  Extremities without ischemic changes.  No deformity or atrophy. No edema. Neurologic: Sensation grossly intact in extremities.  Symmetrical.  Speech is fluent. Motor exam as listed above. Psychiatric: Judgment intact, Mood & affect appropriate for pt's clinical situation. Dermatologic: No rashes or ulcers noted.  No cellulitis or open wounds. Lymph : No Cervical, Axillary, or Inguinal lymphadenopathy.    CBC Lab Results  Component Value  Date    WBC 19.5 (H) 07/03/2022   HGB 6.0 (L) 07/03/2022   HCT 17.3 (L) 07/03/2022   MCV 83.0 07/03/2022   PLT 124 (L) 07/03/2022    BMET    Component Value Date/Time   NA 133 (L) 07/03/2022 0353   K 4.1 07/03/2022 0353   CL 98 07/03/2022 0353   CO2 27 07/03/2022 0353   GLUCOSE 103 (H) 07/03/2022 0353   BUN 96 (H) 07/03/2022 0353   CREATININE 6.08 (H) 07/03/2022 0353   CALCIUM 8.1 (L) 07/03/2022 0353   GFRNONAA 7 (L) 07/03/2022 0353   GFRAA 8 (L) 12/07/2015 1133   Estimated Creatinine Clearance: 8.3 mL/min (A) (by C-G formula based on SCr of 6.08 mg/dL (H)).  COAG Lab Results  Component Value Date   INR 2.2 (H) 07/03/2022   INR 1.3 (H) 06/28/2022   INR 0.9 08/14/2020    Radiology CT HEAD WO CONTRAST (5MM)  Result Date: 07/03/2022 CLINICAL DATA:  Initial evaluation for mental status change, unknown cause. EXAM: CT HEAD WITHOUT CONTRAST TECHNIQUE: Contiguous axial images were obtained from the base of the skull through the vertex without intravenous contrast. RADIATION DOSE REDUCTION: This exam was performed according to the departmental dose-optimization program which includes automated exposure control, adjustment of the mA and/or kV according to patient size and/or use of iterative reconstruction technique. COMPARISON:  Prior study from 06/25/2022. FINDINGS: Brain: Cerebral volume within normal limits for age. Patchy multifocal hypodensities involving the supratentorial cerebral white matter, consistent with chronic small vessel ischemic disease. No acute intracranial hemorrhage. No acute large vessel territory infarct. No mass lesion, midline shift or mass effect. No hydrocephalus or extra-axial fluid collection. Vascular: No abnormal hyperdense vessel. Calcified atherosclerosis present at the skull base. Skull: Scalp soft tissues and calvarium within normal limits. Sinuses/Orbits: Question mild left periorbital soft tissue swelling, of uncertain significance (series 4, image 7). Globes  and orbital soft tissues demonstrate no other acute finding. Chronic left sphenoid sinusitis noted. Paranasal sinuses are otherwise clear. Trace left mastoid effusion, of doubtful significance. Other: None. IMPRESSION: 1. No acute intracranial abnormality. 2. Moderate chronic small vessel ischemic disease. 3. Question mild left periorbital soft tissue swelling, of uncertain significance. Correlation with physical exam recommended. 4. Chronic left sphenoid sinusitis. Electronically Signed   By: Jeannine Boga M.D.   On: 07/03/2022 23:03   [place] Lower Ext Art Right Ltd  Result Date: 07/03/2022 CLINICAL DATA:  76 year old female with history of hematoma after coronary catheterization on 06/27/2022 EXAM: Limited RIGHT LOWER EXTREMITY ARTERIAL DUPLEX SCAN TECHNIQUE: Gray-scale sonography as well as color Doppler and duplex ultrasound was performed to evaluate the lower extremity arteries including the common, superficial and profunda femoral arteries, popliteal artery and calf arteries. COMPARISON:  CTA abdomen pelvis from 07/03/2022. FINDINGS: About the anterior aspect of the right common femoral artery is a partially thrombosed pseudoaneurysm measuring approximately 1.9 x 0.4 x 1.3 cm. Color Doppler demonstrates central patency. There is a short neck measuring approximately 2.9 mm in length, 2.0 mm in with. The common femoral artery appears patent proximal and distal to the pseudoaneurysm. IMPRESSION: Access site pseudoaneurysm measuring up to 1.9 cm just anterior to the right common femoral artery. [name], MD [facility] Radiology Electronically Signed   By: [name] M.D.   On: 07/03/2022 16:26   DG Chest Port 1 View  Result Date: 07/03/2022 CLINICAL DATA:  Acute respiratory failure. EXAM: PORTABLE CHEST 1 VIEW COMPARISON:  Radiographs 06/30/2022 and 06/25/2022. FINDINGS:  1223 hours. Right-sided PICC previously projected to the level the right  atrium; this line now demonstrates acute angulation at the level of the azygous vein and projects to the left, likely within the left brachiocephalic vein. Stable cardiomegaly and aortic atherosclerosis. There is mild vascular congestion with interval improved aeration of the right lung base. No consolidation, pneumothorax or significant pleural effusion identified. Avascular stent is noted in the left axilla. The bones appear unremarkable. Telemetry leads overlie the chest. IMPRESSION: 1. Interval improved aeration of the right lung base. 2. Interval angulation of the right-sided PICC, tip now likely within the left brachiocephalic vein. 3. Cardiomegaly and mild vascular congestion. Electronically Signed   By: Richardean Sale M.D.   On: 07/03/2022 12:37   CT ANGIO GI BLEED  Result Date: 07/03/2022 CLINICAL DATA:  Mesenteric ischemia, acute EXAM: CTA ABDOMEN AND PELVIS WITHOUT AND WITH CONTRAST TECHNIQUE: Multidetector CT imaging of the abdomen and pelvis was performed using the standard protocol during bolus administration of intravenous contrast. Multiplanar reconstructed images and MIPs were obtained and reviewed to evaluate the vascular anatomy. RADIATION DOSE REDUCTION: This exam was performed according to the departmental dose-optimization program which includes automated exposure control, adjustment of the mA and/or kV according to patient size and/or use of iterative reconstruction technique. CONTRAST:  141mL OMNIPAQUE IOHEXOL 350 MG/ML SOLN COMPARISON:  None Available. FINDINGS: VASCULAR Aorta: Aortoiliac atherosclerosis.  No aneurysm or dissection. Celiac: Mild stenosis at the celiac origin. SMA: Patent without significant stenosis.  Mixed atherosclerosis. Renals: Patent without significant stenosis.  Mixed atherosclerosis. IMA: Patent without significant stenosis. Inflow: Mixed atherosclerosis. No significant stenosis. No aneurysm or dissection. Proximal Outflow: Mixed atherosclerosis without  significant stenosis. There is a focal outpouching of the anterior right common femoral artery with a connecting neck, which does not significantly change on delay. This measures approximately 6 x 4 mm (series 6, image 68). There is adjacent soft tissue thickening/possible hematoma (series 5, image 168). Veins: No obvious venous abnormality. Review of the MIP images confirms the above findings. NON-VASCULAR Lower chest: Cardiomegaly. Trace pleural effusions. Lingular and right basilar atelectasis. Hepatobiliary: No focal liver abnormality. The gallbladder is decompressed with tiny layering internal stones and mild wall thickening. Pancreas: No ductal dilation or peripancreatic inflammatory change. Spleen: Unremarkable. Adrenals/Urinary Tract: Mild left adrenal thickening without discrete nodule. Right adrenal gland is normal. Atrophic left kidney. Multiple renal cysts bilaterally in keeping with history of end-stage renal disease on dialysis. The bladder is mildly distended. Stomach/Bowel: The stomach is within normal limits. There is no evidence of bowel obstruction. There is no evidence of active gastrointestinal bleed. Study is somewhat limited by the presence of hyperattenuating material throughout the transverse and proximal descending colon. Sigmoid diverticulosis. Lymphatic: No lymphadenopathy. Reproductive: Prior hysterectomy. Other: No ascites.  No free air. Musculoskeletal: No acute osseous abnormality. No suspicious osseous lesion. Multilevel degenerative changes of the spine. IMPRESSION: No CT evidence of active gastrointestinal bleed. Focal outpouching of the anterior right common femoral artery with adjacent soft tissue thickening/possible small hematoma. This could represent a pseudoaneurysm. Recommend targeted ultrasound. No other acute findings in the abdomen or pelvis. Trace pleural effusions.  Cardiomegaly. Electronically Signed   By: Maurine Simmering M.D.   On: 07/03/2022 11:41   CARDIAC  CATHETERIZATION  Result Date: 06/23/2022   Prox RCA lesion is 30% stenosed.   Dist Cx lesion is 30% stenosed.   Dist LAD lesion is 30% stenosed.   The left ventricular systolic function is normal.   The left ventricular ejection  fraction is 50-55% by visual estimate.   DG Chest 1 View  Result Date: 06/30/2022 CLINICAL DATA:  Short of breath.  Follow-up study.  Inpatient. EXAM: CHEST  1 VIEW COMPARISON:  06/25/2022 and older exams. FINDINGS: Stable enlargement of the cardiac silhouette. Low lung volumes. Mild opacity at both lung bases consistent with atelectasis. Remainder of the lungs is clear. No pneumothorax. Right-sided PICC, tip projecting near the caval atrial junction, new since the prior study. IMPRESSION: 1. No acute findings.  No current evidence of pulmonary edema. 2. Mild lung base opacities consistent with atelectasis. No convincing pneumonia. 3. Stable cardiomegaly. Electronically Signed   By: Lajean Manes M.D.   On: 06/30/2022 11:03   [place] EKG SITE RITE  Result Date: 06/27/2022 If Site Rite image not attached, placement could not be confirmed due to current cardiac rhythm.  CT ANGIO HEAD NECK W WO CM  Result Date: 06/25/2022 CLINICAL DATA:  Provided history: Change in mental status. Additional history provided: sncopal episode last night. EXAM: CT ANGIOGRAPHY HEAD AND NECK TECHNIQUE: Multidetector CT imaging of the head and neck was performed using the standard protocol during bolus administration of intravenous contrast. Multiplanar CT image reconstructions and MIPs were obtained to evaluate the vascular anatomy. Carotid stenosis measurements (when applicable) are obtained utilizing NASCET criteria, using the distal internal carotid diameter as the denominator. RADIATION DOSE REDUCTION: This exam was performed according to the departmental dose-optimization program which includes automated exposure control, adjustment of the mA and/or kV according to patient size and/or use of  iterative reconstruction technique. CONTRAST:  71mL OMNIPAQUE IOHEXOL 350 MG/ML SOLN COMPARISON:  No pertinent prior exams available for comparison. FINDINGS: CT HEAD FINDINGS Brain: Mild generalized cerebral atrophy. Moderate patchy and ill-defined hypoattenuation within the cerebral white matter, nonspecific but compatible with chronic small vessel disease. Small chronic lacunar infarct within the left caudate head. There is no acute intracranial hemorrhage. No demarcated cortical infarct. No extra-axial fluid collection. No evidence of an intracranial mass. No midline shift. Vascular: No hyperdense vessel. Atherosclerotic calcifications. Skull: No fracture or aggressive osseous lesion. Sinuses/Orbits: No mass or acute finding within the imaged orbits. Fluid level, and background mild mucosal thickening, within the left sphenoid sinus. Review of the MIP images confirms the above findings CTA NECK FINDINGS Aortic arch: Common origin of the innominate left common carotid arteries. Atherosclerotic plaque within the visualized aortic arch and proximal major branch vessels of the neck. Streak and beam hardening artifact arising from a dense right-sided contrast bolus partially obscures the right subclavian artery. Within this limitation, there is no appreciable hemodynamically significant innominate or proximal subclavian artery stenosis. Right carotid system: CCA and ICA patent within the neck without hemodynamically significant stenosis (50% or greater). Atherosclerotic plaque at the CCA origin, within the proximal CCA, about the carotid bifurcation and within the proximal ICA. Left carotid system: CCA and ICA patent within the neck without hemodynamically significant stenosis (50% or greater). Atherosclerotic plaque at the CCA origin, about the carotid bifurcation and within the proximal ICA. Vertebral arteries: The dominant right vertebral artery is patent within the neck. Mild atherosclerotic narrowing at the  origin of this vessel. The non dominant left vertebral artery is developmentally diminutive, but patent throughout the neck. Skeleton: Cervical spondylosis. Facet joint ankylosis on the right at C4-C5. No acute fracture or aggressive osseous lesion. Other neck: Calcified nodules within the bilateral thyroid lobes, measuring up to 13 mm, not meeting consensus criteria for ultrasound follow-up based on size. No follow-up imaging is recommended.  Thyroid reference no cervical lymphadenopathy. Upper chest: No consolidation within the imaged lung apices. Emphysema. Review of the MIP images confirms the above findings CTA HEAD FINDINGS Anterior circulation: The intracranial internal carotid arteries are patent. Atherosclerotic plaque within both vessels. Mild to moderate stenosis within the right paraclinoid segment. No more than mild atherosclerotic narrowing of the intracranial left ICA The M1 middle cerebral arteries are patent. No M2 proximal branch occlusion or high-grade proximal stenosis. The anterior cerebral arteries are patent. No intracranial aneurysm is identified. Posterior circulation: The dominant cranial right vertebral artery is patent. Nonstenotic atherosclerotic plaque within this vessel. Severe stenosis within the proximal left V4 segment. The basilar artery is patent. The posterior cerebral arteries are patent. Hypoplastic P1 segment with sizable posterior communicating arteries, bilaterally. Venous sinuses: Within the limitations of contrast timing, no convincing thrombus. Anatomic variants: As described Review of the MIP images confirms the above findings IMPRESSION: CT head: 1. No evidence of acute intracranial abnormality. 2. Moderate chronic small ischemic disease within the cerebral white matter. 3. Small chronic lacunar infarct within the left caudate head. 4. Mild generalized cerebral atrophy. 5. Left sphenoid sinusitis. CTA neck: 1. The common carotid and internal carotid arteries are patent  within the neck without hemodynamically significant stenosis. Atherosclerotic plaque, bilaterally. 2. Vertebral arteries patent within the neck. Mild atherosclerotic narrowing at the origin of the dominant right vertebral artery. 3. Aortic Atherosclerosis (ICD10-I70.0) and Emphysema (ICD10-J43.9). CTA head: 1. No intracranial large vessel occlusion is identified. 2. Intracranial atherosclerotic disease with multifocal stenoses, most notably as follows. 3. Severe stenosis within the left vertebral artery proximal V4 segment. 4. Mild-to-moderate stenosis within the paraclinoid right ICA. Electronically Signed   By: Kellie Simmering D.O.   On: 06/25/2022 12:39   DG Chest Port 1 View  Result Date: 06/25/2022 CLINICAL DATA:  Shortness of breath EXAM: PORTABLE CHEST 1 VIEW COMPARISON:  Chest x-ray June 23, 2022 FINDINGS: Unchanged cardiomediastinal contours including cardiomegaly. Slightly improved aeration of bibasilar lungs. Persistent bilateral reticular and interstitial pulmonary opacities. Probable small bilateral pleural effusions. No large pneumothorax. The visualized upper abdomen is unremarkable. No acute osseous abnormality. IMPRESSION: 1. Slightly improved aeration of bilateral lungs with persistent interstitial pulmonary opacities, most consistent with pulmonary edema. 2. Small bilateral pleural effusions. 3. Cardiomegaly. Electronically Signed   By: Beryle Flock M.D.   On: 06/25/2022 11:42   ECHOCARDIOGRAM COMPLETE BUBBLE STUDY  Result Date: 06/24/2022    ECHOCARDIOGRAM REPORT   Patient Name:   DRUSCILLA PETSCH Laughner Date of Exam: 06/24/2022 Medical Rec #:  [id number]       Height:       62.0 in Accession #:    [id number]      Weight:       186.5 lb Date of Birth:  [birth year]        BSA:          1.856 m Patient Age:    26 years        BP:           125/85 mmHg Patient Gender: F               HR:           72 bpm. Exam Location:  [facility] Procedure: 2D Echo, Cardiac Doppler, Color Doppler and Saline Contrast  Bubble            Study Indications:     Syncope 780.2 / R55  History:  Patient has no prior history of Echocardiogram examinations.                  Risk Factors:Hypertension. CKD.  Sonographer:     Sherrie Sport Referring Phys:  [id number] AMY N [facility] Diagnosing Phys: Yolonda Kida MD  Sonographer Comments: Technically challenging study due to limited acoustic windows and no apical window. IMPRESSIONS  1. Negative bubble study.  2. Left ventricular ejection fraction, by estimation, is 55 to 60%. The left ventricle has normal function. The left ventricle has no regional wall motion abnormalities. There is moderate concentric left ventricular hypertrophy. Left ventricular diastolic function could not be evaluated.  3. Right ventricular systolic function is normal. The right ventricular size is normal.  4. The mitral valve is normal in structure. Trivial mitral valve regurgitation.  5. The aortic valve is calcified. There is severe calcifcation of the aortic valve. There is severe thickening of the aortic valve. Aortic valve regurgitation is mild. Aortic valve sclerosis/calcification is present, without any evidence of aortic stenosis. FINDINGS  Left Ventricle: Left ventricular ejection fraction, by estimation, is 55 to 60%. The left ventricle has normal function. The left ventricle has no regional wall motion abnormalities. The left ventricular internal cavity size was normal in size. There is  moderate concentric left ventricular hypertrophy. Left ventricular diastolic function could not be evaluated. Right Ventricle: The right ventricular size is normal. No increase in right ventricular wall thickness. Right ventricular systolic function is normal. Left Atrium: Left atrial size was normal in size. Right Atrium: Right atrial size was normal in size. Pericardium: There is no evidence of pericardial effusion. Mitral Valve: The mitral valve is normal in structure. There is mild thickening of the mitral valve  leaflet(s). There is mild calcification of the mitral valve leaflet(s). Normal mobility of the mitral valve leaflets. Trivial mitral valve regurgitation. Tricuspid Valve: The tricuspid valve is grossly normal. Tricuspid valve regurgitation is mild. Aortic Valve: The aortic valve is calcified. There is severe calcifcation of the aortic valve. There is severe thickening of the aortic valve. There is moderate to severe aortic valve annular calcification. Aortic valve regurgitation is mild. Aortic valve sclerosis/calcification is present, without any evidence of aortic stenosis. Pulmonic Valve: The pulmonic valve was normal in structure. Pulmonic valve regurgitation is not visualized. Aorta: The ascending aorta was not well visualized. IAS/Shunts: No atrial level shunt detected by color flow Doppler. Agitated saline contrast was given intravenously to evaluate for intracardiac shunting. Additional Comments: Negative bubble study.  LEFT VENTRICLE PLAX 2D LVIDd:         4.20 cm LVIDs:         3.00 cm LV PW:         1.50 cm LV IVS:        1.70 cm LVOT diam:     2.00 cm LVOT Area:     3.14 cm  LEFT ATRIUM         Index LA diam:    3.60 cm 1.94 cm/m                        PULMONIC VALVE AORTA                 PR End Diast Vel: 9.73 msec Ao Root diam: 2.70 cm  TRICUSPID VALVE TR Peak grad:   42.8 mmHg TR Vmax:        327.00 cm/s  SHUNTS Systemic Diam: 2.00 cm Yolonda Kida MD  Electronically signed by Yolonda Kida MD Signature Date/Time: 06/24/2022/8:13:11 PM    Final    DG Chest Portable 1 View  Result Date: 06/19/2022 CLINICAL DATA:  Shortness of breath EXAM: PORTABLE CHEST 1 VIEW COMPARISON:  Multiple chest x-rays, most recently August 14, 2020 FINDINGS: Cardiomegaly. Unchanged mediastinal contours including calcified atherosclerosis of the aortic arch. Diffuse bilateral interstitial pulmonary opacities. Moderate left and small right pleural effusions. No large pneumothorax. No acute osseous abnormality. The  visualized upper abdomen is unremarkable. IMPRESSION: 1. Moderate pulmonary edema. 2. Moderate left and small right pleural effusions. 3. Cardiomegaly. Electronically Signed   By: Beryle Flock M.D.   On: 07/07/2022 16:41      Assessment/Plan 1.  Right common femoral artery pseudoaneurysm  Noninvasive studies showed a right common femoral artery pseudoaneurysm secondary to recent heart catheterization.  We will plan for possible thrombin injection based on size and location however if this is not possible we will plan on covered stent placement with angiogram.  I discussed both options with the family including risk, benefits and alternatives and they are agreeable to proceed.   Plan of care discussed with Dr. Lucky Cowboy and he is in agreement with plan noted above.   Family Communication: Discussed with nephew and niece, Derwood Kaplan (designated power of attorney) at bedside  Total Time: 85 minutes I spent 85 minutes in this encounter including personally reviewing extensive medical records, personally reviewing imaging studies and compared to prior scans, counseling the patient, placing orders, coordinating care and performing appropriate documentation  Thank you for allowing [place] to participate in the care of this patient.   Kris Hartmann, NP [facility] [phone] (Office Phone) [phone] (Office Fax) [phone] (Pager)  07/03/2022 12:28 AM  Staff may message me via secure chat in [place]  but this may not receive immediate response,  please page for urgent matters!  Dictation software was used to generate the above note. Typos may occur and escape review, as with typed/written notes. Any error is purely unintentional.  Please contact me directly for clarity if needed.

## 2022-07-04 NOTE — Progress Notes (Signed)
                                                     Palliative Care Progress Note, Assessment & Plan   Patient Name: Kathleen Sharp       Date: 07/14/2022 DOB: 1945/10/09  Age: 76 y.o. MRN#: [id number] Attending Physician: Damita Lack, MD Primary Care Physician: Adalberto Ill, MD Admit Date: 06/21/2022  Reason for Consultation/Follow-up: Establishing goals of care  Subjective: Patient is lying flat in bed since she just returned from right lower extremity angiogram.  She is snoring at the end of the room but awakens easily to voice.  She acknowledges my presence and is able to make her wishes known.  However, she fell asleep in the middle of our conversation.  Patient's nephew is at bedside.  Summary of counseling/coordination of care: After reviewing the patient's chart and assessing the patient at bedside, I gave a brief medical update to nephew.  He asked questions regarding patient's current health status.  Education provided on hypotension, vasopressors, ESRD, acute metabolic encephalopathy, and pseudoaneurysm.  He was appreciative for this update.  He shares that patient's decision maker [name] would like to receive updates from team as well.  After meeting with patient and nephew, I counseled with patient's dayshift RN.  No other family has been bedside today. Given patient's inability to participate in discussions this morning, attempted to speak with patient's niece/POA [name] over the phone.  No answer.  PMT will remain available to patient and family for [place] discussions and support.   Physical Exam Vitals reviewed.  Constitutional:      General: She is not in acute distress.    Appearance: She is not ill-appearing or toxic-appearing.  HENT:     Head: Normocephalic.  Cardiovascular:     Rate and Rhythm: Tachycardia present.   Pulmonary:     Effort: Pulmonary effort is normal.     Comments: Mild belly breathing noted Musculoskeletal:     Comments: Generalized weakness  Skin:    General: Skin is warm and dry.  Psychiatric:        Mood and Affect: Mood is not anxious.        Behavior: Behavior is not agitated.             Palliative Assessment/Data: 30%    Total Time 35 minutes  Greater than 50%  of this time was spent counseling and coordinating care related to the above assessment and plan.  Thank you for allowing the Palliative Medicine Team to assist in the care of this patient.  Lluveras Ilsa Iha, FNP-BC Palliative Medicine Team Team Phone # [phone]

## 2022-07-04 NOTE — Op Note (Signed)
[facility]  Percutaneous Study/Intervention Procedural Note   Date of Surgery: 07/02/2022  Surgeon(s):Junie Avilla    Assistants:none  Pre-operative Diagnosis: pseudoaneurysm right femoral artery after cardiac cath  Post-operative diagnosis:  Same  Procedure(s) Performed:             1.  Ultrasound guidance for vascular access left femoral artery             2.  Catheter placement into right common femoral artery from left femoral approach             3.  Aortogram and selective right lower extremity angiogram             4.  Covered stent placement to right common femoral artery with 8 mm diameter by 5 cm length Viabahn stent to exclude the pseudoaneurysm             5.  StarClose closure device left femoral artery  EBL: 5 cc  Contrast: 45 cc  Fluoro Time: 4 minutes  Moderate Conscious Sedation Time: approximately 27 minutes using 0.5 mg of Versed and 50 mcg of Fentanyl              Indications:  Patient is a 76 y.o.female with a pseudoaneurysm in the right groin after cardiac catheterization.  She has hemodynamic instability requiring Levophed and on her CT scan it did not appear to be an optimal candidate for thrombin injection due to a wide neck. The patient is brought in for angiography for further evaluation and potential treatment. Risks and benefits are discussed and informed consent is obtained.   Procedure:  The patient was identified and appropriate procedural time out was performed.  The patient was then placed supine on the table and prepped and draped in the usual sterile fashion. Moderate conscious sedation was administered during a face to face encounter with the patient throughout the procedure with my supervision of the RN administering medicines and monitoring the patient's vital signs, pulse oximetry, telemetry and mental status throughout from the start of the procedure until the patient was taken to the recovery room. Ultrasound was used to  evaluate the left common femoral artery.  It was patent .  A digital ultrasound image was acquired.  A Seldinger needle was used to access the left common femoral artery under direct ultrasound guidance and a permanent image was performed.  A 0.035 J wire was advanced without resistance and a 5Fr sheath was placed.  Pigtail catheter was placed into the aorta and an AP aortogram was performed. This demonstrated normal left renal artery with some degree of disease in the right renal artery and normal aorta and iliac segments without significant stenosis. I then crossed the aortic bifurcation and advanced to the right femoral head. Selective right lower extremity angiogram was then performed. This demonstrated that the right common femoral artery had moderate calcific plaque with what appeared to be less than 50% stenosis but there was a clear pseudoaneurysm in the mid right common femoral artery about 1-1/2 to 2 cm above the femoral bifurcation.  Both the profunda femoris artery and proximal to mid superficial femoral artery appeared widely patent without significant disease. It was felt that it was in the patient's best interest to proceed with intervention and a covered stent was an appropriate treatment for the pseudoaneurysm in the right common femoral artery.  An advantage wire was parked in the right SFA and a 7 [place] Ansell sheath was placed over the advantage  wire and parked in the distal right external iliac artery.  We then exchanged for a V18 wire which was parked in the distal superficial femoral artery and proximal popliteal artery.  Magnified images in the steep RAO projection showed the origin of the profunda femoris artery and the pseudoaneurysm well and this was the projection that we would place the stent.  We selected an 8 mm diameter by 5 cm length Viabahn stent deployed this just above the femoral bifurcation with at least 1 cm of vessel below the pseudoaneurysm in 2 to 3 cm of the vessel above  the pseudoaneurysm for seal.  This was postdilated with a 7 mm balloon with excellent angiographic completion result.  The pseudoaneurysm was successfully excluded and there is no significant residual stenosis.  I elected to terminate the procedure. The sheath was removed and StarClose closure device was deployed in the left femoral artery with excellent hemostatic result. The patient was taken to the recovery room in stable condition having tolerated the procedure well.  Findings:               Aortogram:  This demonstrated normal left renal artery with some degree of disease in the right renal artery and normal aorta and iliac segments without significant stenosis.             Right lower Extremity: The right common femoral artery had moderate calcific plaque with what appeared to be less than 50% stenosis but there was a clear pseudoaneurysm in the mid right common femoral artery about 1-1/2 to 2 cm above the femoral bifurcation.  Both the profunda femoris artery and proximal to mid superficial femoral artery appeared widely patent without significant disease.   Disposition: Patient was taken to the recovery room in stable condition having tolerated the procedure well.  Complications: None  Leotis Pain 06/17/2022 10:55 AM   This note was created with Dragon Medical transcription system. Any errors in dictation are purely unintentional.

## 2022-07-05 ENCOUNTER — Encounter: Payer: Self-pay | Admitting: Vascular Surgery

## 2022-07-05 ENCOUNTER — Inpatient Hospital Stay: Payer: Medicare HMO

## 2022-07-05 DIAGNOSIS — I214 Non-ST elevation (NSTEMI) myocardial infarction: Secondary | ICD-10-CM | POA: Diagnosis not present

## 2022-07-05 DIAGNOSIS — D62 Acute posthemorrhagic anemia: Secondary | ICD-10-CM | POA: Diagnosis not present

## 2022-07-05 LAB — GLUCOSE, CAPILLARY
Glucose-Capillary: 109 mg/dL — ABNORMAL HIGH (ref 70–99)
Glucose-Capillary: 111 mg/dL — ABNORMAL HIGH (ref 70–99)
Glucose-Capillary: 116 mg/dL — ABNORMAL HIGH (ref 70–99)
Glucose-Capillary: 116 mg/dL — ABNORMAL HIGH (ref 70–99)
Glucose-Capillary: 117 mg/dL — ABNORMAL HIGH (ref 70–99)
Glucose-Capillary: 119 mg/dL — ABNORMAL HIGH (ref 70–99)
Glucose-Capillary: 123 mg/dL — ABNORMAL HIGH (ref 70–99)
Glucose-Capillary: 126 mg/dL — ABNORMAL HIGH (ref 70–99)
Glucose-Capillary: 131 mg/dL — ABNORMAL HIGH (ref 70–99)
Glucose-Capillary: 131 mg/dL — ABNORMAL HIGH (ref 70–99)
Glucose-Capillary: 133 mg/dL — ABNORMAL HIGH (ref 70–99)
Glucose-Capillary: 25 mg/dL — CL (ref 70–99)

## 2022-07-05 LAB — HEMOGLOBIN AND HEMATOCRIT, BLOOD
HCT: 22.5 % — ABNORMAL LOW (ref 36.0–46.0)
HCT: 23.5 % — ABNORMAL LOW (ref 36.0–46.0)
HCT: 23.6 % — ABNORMAL LOW (ref 36.0–46.0)
HCT: 24.2 % — ABNORMAL LOW (ref 36.0–46.0)
Hemoglobin: 7.5 g/dL — ABNORMAL LOW (ref 12.0–15.0)
Hemoglobin: 7.9 g/dL — ABNORMAL LOW (ref 12.0–15.0)
Hemoglobin: 7.9 g/dL — ABNORMAL LOW (ref 12.0–15.0)
Hemoglobin: 8.1 g/dL — ABNORMAL LOW (ref 12.0–15.0)

## 2022-07-05 LAB — BLOOD GAS, VENOUS
Acid-Base Excess: 2 mmol/L (ref 0.0–2.0)
Acid-Base Excess: 1.2 mmol/L (ref 0.0–2.0)
Bicarbonate: 29.4 mmol/L — ABNORMAL HIGH (ref 20.0–28.0)
Bicarbonate: 29.8 mmol/L — ABNORMAL HIGH (ref 20.0–28.0)
Delivery systems: POSITIVE
FIO2: 40 %
O2 Content: 6 L/min
O2 Saturation: 87.1 %
O2 Saturation: 75.7 %
Patient temperature: 37
Patient temperature: 37
RATE: 8 resp/min
pCO2, Ven: 57 mmHg (ref 44–60)
pCO2, Ven: 65 mmHg — ABNORMAL HIGH (ref 44–60)
pH, Ven: 7.32 (ref 7.25–7.43)
pH, Ven: 7.27 (ref 7.25–7.43)
pO2, Ven: 54 mmHg — ABNORMAL HIGH (ref 32–45)
pO2, Ven: 45 mmHg (ref 32–45)

## 2022-07-05 LAB — BASIC METABOLIC PANEL
Anion gap: 13 (ref 5–15)
Anion gap: 8 (ref 5–15)
BUN: 86 mg/dL — ABNORMAL HIGH (ref 8–23)
BUN: 70 mg/dL — ABNORMAL HIGH (ref 8–23)
CO2: 26 mmol/L (ref 22–32)
CO2: 27 mmol/L (ref 22–32)
Calcium: 8.2 mg/dL — ABNORMAL LOW (ref 8.9–10.3)
Calcium: 8 mg/dL — ABNORMAL LOW (ref 8.9–10.3)
Chloride: 93 mmol/L — ABNORMAL LOW (ref 98–111)
Chloride: 95 mmol/L — ABNORMAL LOW (ref 98–111)
Creatinine, Ser: 5.55 mg/dL — ABNORMAL HIGH (ref 0.44–1.00)
Creatinine, Ser: 5.19 mg/dL — ABNORMAL HIGH (ref 0.44–1.00)
GFR, Estimated: 8 mL/min — ABNORMAL LOW (ref >60)
GFR, Estimated: 8 mL/min — ABNORMAL LOW (ref >60)
Glucose, Bld: 125 mg/dL — ABNORMAL HIGH (ref 70–99)
Glucose, Bld: 111 mg/dL — ABNORMAL HIGH (ref 70–99)
Potassium: 4.4 mmol/L (ref 3.5–5.1)
Potassium: 4.2 mmol/L (ref 3.5–5.1)
Sodium: 132 mmol/L — ABNORMAL LOW (ref 135–145)
Sodium: 130 mmol/L — ABNORMAL LOW (ref 135–145)

## 2022-07-05 LAB — CBC WITH DIFFERENTIAL/PLATELET
Abs Immature Granulocytes: 0.42 10*3/uL — ABNORMAL HIGH (ref 0.00–0.07)
Basophils Absolute: 0.1 10*3/uL (ref 0.0–0.1)
Basophils Relative: 0 %
Eosinophils Absolute: 0.5 10*3/uL (ref 0.0–0.5)
Eosinophils Relative: 2 %
HCT: 24.3 % — ABNORMAL LOW (ref 36.0–46.0)
Hemoglobin: 8 g/dL — ABNORMAL LOW (ref 12.0–15.0)
Immature Granulocytes: 2 %
Lymphocytes Relative: 6 %
Lymphs Abs: 1.3 10*3/uL (ref 0.7–4.0)
MCH: 28 pg (ref 26.0–34.0)
MCHC: 32.9 g/dL (ref 30.0–36.0)
MCV: 85 fL (ref 80.0–100.0)
Monocytes Absolute: 2.8 10*3/uL — ABNORMAL HIGH (ref 0.1–1.0)
Monocytes Relative: 13 %
Neutro Abs: 17.6 10*3/uL — ABNORMAL HIGH (ref 1.7–7.7)
Neutrophils Relative %: 77 %
Platelets: 128 10*3/uL — ABNORMAL LOW (ref 150–400)
RBC: 2.86 MIL/uL — ABNORMAL LOW (ref 3.87–5.11)
RDW: 16.4 % — ABNORMAL HIGH (ref 11.5–15.5)
WBC: 22.8 10*3/uL — ABNORMAL HIGH (ref 4.0–10.5)
nRBC: 0.2 % (ref 0.0–0.2)

## 2022-07-05 LAB — MAGNESIUM
Magnesium: 2.1 mg/dL (ref 1.7–2.4)
Magnesium: 1.9 mg/dL (ref 1.7–2.4)

## 2022-07-05 LAB — TYPE AND SCREEN
ABO/RH(D): O POS
Antibody Screen: NEGATIVE

## 2022-07-05 LAB — LACTIC ACID, PLASMA
Lactic Acid, Venous: 1.3 mmol/L (ref 0.5–1.9)
Lactic Acid, Venous: 1 mmol/L (ref 0.5–1.9)

## 2022-07-05 LAB — PHOSPHORUS: Phosphorus: 4.3 mg/dL (ref 2.5–4.6)

## 2022-07-05 LAB — BRAIN NATRIURETIC PEPTIDE: B Natriuretic Peptide: >4500 pg/mL — ABNORMAL HIGH (ref 0.0–100.0)

## 2022-07-05 LAB — PREPARE RBC (CROSSMATCH)

## 2022-07-05 MED ORDER — PENTAFLUOROPROP-TETRAFLUOROETH EX AERO
INHALATION_SPRAY | CUTANEOUS | Status: AC
  Filled 2022-07-05: qty 30

## 2022-07-05 MED ORDER — ALTEPLASE 2 MG IJ SOLR: 2.0000 mg | Freq: Once | INTRAMUSCULAR | Status: AC

## 2022-07-05 MED ORDER — SODIUM CHLORIDE 0.9% IV SOLUTION: Freq: Once | INTRAVENOUS | Status: AC

## 2022-07-05 MED ORDER — FUROSEMIDE 10 MG/ML IJ SOLN
120.0000 mg | Freq: Once | INTRAVENOUS | Status: DC
  Filled 2022-07-05: qty 12

## 2022-07-05 MED ORDER — FUROSEMIDE 10 MG/ML IJ SOLN
40.0000 mg | Freq: Once | INTRAMUSCULAR | Status: AC
  Administered 2022-07-05: 40 mg via INTRAVENOUS
  Filled 2022-07-05: qty 4

## 2022-07-05 MED ORDER — ORAL CARE MOUTH RINSE: 15.0000 mL | OROMUCOSAL | Status: DC | PRN

## 2022-07-05 MED ORDER — MIDODRINE HCL 5 MG PO TABS
5.0000 mg | ORAL_TABLET | ORAL | Status: DC
  Administered 2022-07-05: 5 mg via ORAL
  Filled 2022-07-05: qty 1

## 2022-07-05 MED ORDER — HYDROMORPHONE HCL 1 MG/ML IJ SOLN
1.0000 mg | Freq: Once | INTRAMUSCULAR | Status: AC
  Administered 2022-07-05: 1 mg via INTRAVENOUS
  Filled 2022-07-05: qty 1

## 2022-07-05 MED ORDER — ORAL CARE MOUTH RINSE
15.0000 mL | OROMUCOSAL | Status: DC
  Administered 2022-07-05 – 2022-07-08 (×13): 15 mL via OROMUCOSAL

## 2022-07-05 MED ORDER — LIDOCAINE HCL 1 % IJ SOLN
INTRAMUSCULAR | Status: AC
  Filled 2022-07-05: qty 10

## 2022-07-05 MED ORDER — ALTEPLASE 2 MG IJ SOLR
INTRAMUSCULAR | Status: AC
  Administered 2022-07-05: 2 mg
  Filled 2022-07-05: qty 2

## 2022-07-05 NOTE — Plan of Care (Signed)
Left arm more swollen, s/p HD today Case discussed with Dr Lucky Cowboy, obtain DUPLEX US to assess swelling.

## 2022-07-05 NOTE — Progress Notes (Signed)
Daily Progress Note   Patient Name: Kathleen Sharp       Date: 07/05/2022 DOB: [birth year]  Age: 76 y.o. MRN#: [id number] Attending Physician: Damita Lack, MD Primary Care Physician: Adalberto Ill, MD Admit Date: 06/19/2022  Reason for Consultation/Follow-up: Establishing goals of care  Subjective: Notes and labs reviewed.  Patient is sitting in bed.  She is alert and oriented and answering all questions appropriately.  She discusses that her surrogate decision maker would be her niece.  Patient confirms DNR/DNI status.  She states that at this time she wishes to continue with dialysis.  She cannot think of a time where she would want to stop dialysis as long as she is a candidate to do so.  Continue to treat the treatable.  Length of Stay: 11  Current Medications: Scheduled Meds:   sodium chloride   Intravenous Once   Chlorhexidine Gluconate Cloth  6 each Topical Q0600   midodrine  10 mg Oral TID WC   midodrine  5 mg Oral Q M,W,F-HD   mouth rinse  15 mL Mouth Rinse 4 times per day   pantoprazole (PROTONIX) IV  40 mg Intravenous Q12H   sodium chloride flush  10-40 mL Intracatheter Q12H   sodium chloride flush  3 mL Intravenous Q12H   sodium chloride flush  3 mL Intravenous Q12H   sodium chloride flush  3 mL Intravenous Q12H    Continuous Infusions:  sodium chloride     dextrose 20 mL/hr at 07/05/22 1315   norepinephrine (LEVOPHED) Adult infusion 9 mcg/min (07/05/22 0700)    PRN Meds: sodium chloride, acetaminophen, fentaNYL (SUBLIMAZE) injection, guaiFENesin, haloperidol, hydrALAZINE, HYDROmorphone (DILAUDID) injection, ipratropium-albuterol, metoprolol tartrate, ondansetron (ZOFRAN) IV, ondansetron (ZOFRAN) IV, mouth rinse, oxyCODONE, senna-docusate, sodium chloride  flush, sodium chloride flush  Physical Exam Pulmonary:     Effort: Pulmonary effort is normal.  Neurological:     Mental Status: She is alert.             Vital Signs: BP (!) 133/45 (BP Location: Right Wrist)   Pulse (!) 104   Temp 98.6 F (37 C) (Oral)   Resp (!) 26   Ht $R'5\' 2"'mk$  (1.575 m)   Wt 91.3 kg   SpO2 92%   BMI 36.81 kg/m  SpO2: SpO2: 92 % O2 Device: O2  Device: Nasal Cannula O2 Flow Rate: O2 Flow Rate (L/min): 2 L/min  Intake/output summary:  Intake/Output Summary (Last 24 hours) at 07/05/2022 1630 Last data filed at 07/05/2022 1328 Gross per 24 hour  Intake 667.91 ml  Output 0 ml  Net 667.91 ml   LBM: Last BM Date : 07/11/2022 Baseline Weight: Weight: 82.1 kg Most recent weight: Weight: 91.3 kg  Flowsheet Rows    Flowsheet Row Most Recent Value  Intake Tab   Referral Department Hospitalist  Unit at Time of Referral Other (Comment)  Palliative Care Primary Diagnosis Cardiac  Date Notified 06/26/22  Palliative Care Type New Palliative care  Reason for referral Clarify Goals of Care  Date of Admission 06/24/2022  Date first seen by Palliative Care 06/27/22  # of days Palliative referral response time 1 Day(s)  # of days IP prior to Palliative referral 3  Clinical Assessment   Palliative Performance Scale Score 20%  Pain Max last 24 hours Not able to report  Pain Min Last 24 hours Not able to report  Dyspnea Max Last 24 Hours Not able to report  Dyspnea Min Last 24 hours Not able to report  Psychosocial & Spiritual Assessment   Palliative Care Outcomes        Patient Active Problem List   Diagnosis Date Noted   Pseudoaneurysm following procedure ([place])    Anemia, posthemorrhagic, acute 88/50/2774   Metabolic encephalopathy 12/87/8676   Cardiac arrhythmia 06/25/2022   Syncope and collapse 06/25/2022   NSTEMI (non-ST elevated myocardial infarction) ([place]) 06/24/2022   Volume overload 06/16/2022   Hyperkalemia 06/26/2022   Pulmonary edema 06/26/2022    SOB (shortness of breath)    Acute hypoxemic respiratory failure due to COVID-19 ([place]) 08/14/2020   End stage renal disease ([place]) 72/05/4708   Complication of vascular access for dialysis 07/11/2016   Essential hypertension 07/11/2016   Hemodialysis-associated hypotension 07/11/2016   Anemia of chronic renal failure, stage 5 ([place]) 07/18/2015    Palliative Care Assessment & Plan   Recommendations/Plan: Patient confirms DNR/DNI.  She would like to continue dialysis and to treat the treatable.   Code Status:    Code Status Orders  (From admission, onward)           Start     Ordered   06/30/22 1243  Do not attempt resuscitation (DNR)  Continuous       Question Answer Comment  In the event of cardiac or respiratory ARREST Do not call a "code blue"   In the event of cardiac or respiratory ARREST Do not perform Intubation, CPR, defibrillation or ACLS   In the event of cardiac or respiratory ARREST Use medication by any route, position, wound care, and other measures to relive pain and suffering. May use oxygen, suction and manual treatment of airway obstruction as needed for comfort.   Comments Full scope of medical care      06/30/22 1242           Code Status History     Date Active Date Inactive Code Status Order ID Comments User Context   07/07/2022 [id number] 06/30/2022 1242 Full Code [id number]  Criss Alvine, DO ED   08/14/2020 2214 08/16/2020 1743 Full Code [id number]  Vianne Bulls, MD Inpatient       Care plan was discussed with CCM  Thank you for allowing the Palliative Medicine Team to assist in the care of this patient.    Asencion Gowda, NP  Please contact Palliative Medicine Team phone  at [phone] for questions and concerns.

## 2022-07-05 NOTE — Progress Notes (Signed)
Pre HD RN assessment 

## 2022-07-05 NOTE — Plan of Care (Signed)
  Problem: Coping: Goal: Level of anxiety will decrease Outcome: Not Progressing   Problem: Nutrition: Goal: Adequate nutrition will be maintained Outcome: Progressing

## 2022-07-05 NOTE — Progress Notes (Signed)
Pt HD ended 2.5 hours early. Pt repeatedly moved access arm. AP high and resistance when flushed. Pt alert, vss, report to ICU RN. Start: 1213 End: 1328 No fluid removed BVP: 24L No HD meds ordered

## 2022-07-05 NOTE — Progress Notes (Signed)
Post hd rn assessment 

## 2022-07-05 NOTE — Progress Notes (Signed)
[facility]  ROUNDING NOTE   Subjective:   Kathleen Sharp is a 76 year old female with past medical conditions including hyperlipidemia, hypertension, cardiomegaly, and end-stage renal disease on hemodialysis.  Patient presents to the emergency department with complaints of shortness of breath and has been admitted for Hyperkalemia [E87.5] SOB (shortness of breath) [R06.02] Tachyarrhythmia [R00.0] Volume overload [E87.70]  Patient is known to our practice and receives outpatient dialysis treatments at [facility] on a MWF schedule, supervised by Dr. Candiss Norse.    Patient was seen in the ICU.   Patient was awake, following commands.  Patient offers no new complaints   Objective:  Vital signs in last 24 hours:  Temp:  [97.6 F (36.4 C)-98.1 F (36.7 C)] 98.1 F (36.7 C) (10/20 0800) Pulse Rate:  [80-138] 92 (10/20 0800) Resp:  [15-39] 24 (10/20 0800) BP: (43-143)/(15-128) 99/74 (10/20 0800) SpO2:  [3 %-100 %] 96 % (10/20 0800)  Weight change:  Filed Weights   06/29/22 0415 07/03/22 1530 07/03/22 1914  Weight: 85.5 kg 86.8 kg 86.4 kg    Intake/Output: I/O last 3 completed shifts: In: 2169.6 [P.O.:240; I.V.:1106.2; Blood:823.3] Out: -    Intake/Output this shift:  No intake/output data recorded.  Physical Exam: General: NAD  Head: Normocephalic, atraumatic. Moist oral mucosal membranes  Eyes: Anicteric  Lungs:  Diminished in bases, normal effort, room air  Heart: Regular rate and rhythm  Abdomen:  Soft, nontender, obese  Extremities: Trace peripheral edema.  Neurologic: Nonfocal, moving all four extremities  Skin: No lesions  Access: Left AVG    Basic Metabolic Panel: Recent Labs  Lab 06/19/2022 0403 07/02/22 0445 07/03/22 0353 06/21/2022 0540 07/07/2022 1222 07/05/22 0356  NA 132* 135 133* 133*  --  132*  K 4.3 3.6 4.1 3.5  --  4.4  CL 94* 98 98 95*  --  93*  CO2 $Re'28 29 27 27  'OjU$ --  26  GLUCOSE 100* 100* 103* 144* 120* 125*  BUN 41* 33*  96* 80*  --  86*  CREATININE 7.37* 4.69* 6.08* 4.26*  --  5.55*  CALCIUM 8.5* 8.3* 8.1* 7.8*  --  8.2*  MG 2.5* 1.9 1.9 1.6*  --  2.1  PHOS 5.2* 3.5 2.7 2.9  --  4.3    Liver Function Tests: Recent Labs  Lab 06/30/22 0555  AST 29  ALT 124*  ALKPHOS 52  BILITOT 0.4  PROT 6.4*  ALBUMIN 3.7   No results for input(s): "LIPASE", "AMYLASE" in the last 168 hours. No results for input(s): "AMMONIA" in the last 168 hours.  CBC: Recent Labs  Lab 06/16/2022 0403 07/02/22 0445 07/02/22 1603 07/03/22 0353 07/03/22 1153 07/03/22 1800 07/03/2022 0540 07/03/2022 1222 06/25/2022 1713 07/05/22 0005 07/05/22 0356  WBC 10.7* 11.0*  --  18.5* 19.5*  --  27.4*  --   --   --  22.8*  NEUTROABS 7.9* 8.0*  --  12.2*  --   --  20.7*  --   --   --  17.6*  HGB 8.1* 7.3*   < > 6.3* 7.1*   < > 7.1* 9.1* 8.5* 7.9* 7.9*  8.0*  HCT 25.8* 23.9*   < > 19.9* 21.9*   < > 21.0* 27.2* 25.1* 23.6* 23.5*  24.3*  MCV 81.6 83.3  --  81.6 83.0  --  82.7  --   --   --  85.0  PLT 162 147*  --  141* 124*  --  125*  --   --   --  128*   < > = values in this interval not displayed.    Cardiac Enzymes: No results for input(s): "CKTOTAL", "CKMB", "CKMBINDEX", "TROPONINI" in the last 168 hours.  BNP: Invalid input(s): "POCBNP"  CBG: Recent Labs  Lab 07/12/2022 2159 07/15/2022 2354 07/05/22 0152 07/05/22 0402 07/05/22 0616  GLUCAP 135* 119* 131* 133* 46*    Microbiology: Results for orders placed or performed during the hospital encounter of 06/21/2022  Resp Panel by RT-PCR (Flu A&B, Covid) Anterior Nasal Swab     Status: None   Collection Time: 06/20/2022  5:13 PM   Specimen: Anterior Nasal Swab  Result Value Ref Range Status   SARS Coronavirus 2 by RT PCR NEGATIVE NEGATIVE Final    Comment: (NOTE) SARS-CoV-2 target nucleic acids are NOT DETECTED.  The SARS-CoV-2 RNA is generally detectable in upper respiratory specimens during the acute phase of infection. The lowest concentration of SARS-CoV-2 viral copies  this assay can detect is 138 copies/mL. A negative result does not preclude SARS-Cov-2 infection and should not be used as the sole basis for treatment or other patient management decisions. A negative result may occur with  improper specimen collection/handling, submission of specimen other than nasopharyngeal swab, presence of viral mutation(s) within the areas targeted by this assay, and inadequate number of viral copies(<138 copies/mL). A negative result must be combined with clinical observations, patient history, and epidemiological information. The expected result is Negative.  Fact Sheet for Patients:  EntrepreneurPulse.com.au  Fact Sheet for Healthcare Providers:  IncredibleEmployment.be  This test is no t yet approved or cleared by the [place] FDA and  has been authorized for detection and/or diagnosis of SARS-CoV-2 by FDA under an Emergency Use Authorization (EUA). This EUA will remain  in effect (meaning this test can be used) for the duration of the COVID-19 declaration under Section 564(b)(1) of the Act, 21 U.S.C.section 360bbb-3(b)(1), unless the authorization is terminated  or revoked sooner.       Influenza A by PCR NEGATIVE NEGATIVE Final   Influenza B by PCR NEGATIVE NEGATIVE Final    Comment: (NOTE) The Xpert Xpress SARS-CoV-2/FLU/RSV plus assay is intended as an aid in the diagnosis of influenza from Nasopharyngeal swab specimens and should not be used as a sole basis for treatment. Nasal washings and aspirates are unacceptable for Xpert Xpress SARS-CoV-2/FLU/RSV testing.  Fact Sheet for Patients: EntrepreneurPulse.com.au  Fact Sheet for Healthcare Providers: IncredibleEmployment.be  This test is not yet approved or cleared by the [place] FDA and has been authorized for detection and/or diagnosis of SARS-CoV-2 by FDA under an Emergency Use Authorization (EUA). This EUA  will remain in effect (meaning this test can be used) for the duration of the COVID-19 declaration under Section 564(b)(1) of the Act, 21 U.S.C. section 360bbb-3(b)(1), unless the authorization is terminated or revoked.  Performed at [facility], [address] [place], [place] [address]   MRSA Next Gen by PCR, Nasal     Status: None   Collection Time: 06/24/22  4:49 PM   Specimen: Nasal Mucosa; Nasal Swab  Result Value Ref Range Status   MRSA by PCR Next Gen NOT DETECTED NOT DETECTED Final    Comment: (NOTE) The GeneXpert MRSA Assay (FDA approved for NASAL specimens only), is one component of a comprehensive MRSA colonization surveillance program. It is not intended to diagnose MRSA infection nor to guide or monitor treatment for MRSA infections. Test performance is not FDA approved in patients less than 51 years old. Performed at [facility] [employer]  [facility], [address] [place], [place] [address]     Coagulation Studies: Recent Labs    07/03/22 1530  LABPROT 24.3*  INR 2.2*     Urinalysis: No results for input(s): "COLORURINE", "LABSPEC", "PHURINE", "GLUCOSEU", "HGBUR", "BILIRUBINUR", "KETONESUR", "PROTEINUR", "UROBILINOGEN", "NITRITE", "LEUKOCYTESUR" in the last 72 hours.  Invalid input(s): "APPERANCEUR"    Imaging: PERIPHERAL VASCULAR CATHETERIZATION  Result Date: 07/13/2022 See surgical note for result.  CT HEAD WO CONTRAST (5MM)  Result Date: 07/03/2022 CLINICAL DATA:  Initial evaluation for mental status change, unknown cause. EXAM: CT HEAD WITHOUT CONTRAST TECHNIQUE: Contiguous axial images were obtained from the base of the skull through the vertex without intravenous contrast. RADIATION DOSE REDUCTION: This exam was performed according to the departmental dose-optimization program which includes automated exposure control, adjustment of the mA and/or kV according to patient size and/or use of iterative reconstruction technique. COMPARISON:  Prior  study from 06/25/2022. FINDINGS: Brain: Cerebral volume within normal limits for age. Patchy multifocal hypodensities involving the supratentorial cerebral white matter, consistent with chronic small vessel ischemic disease. No acute intracranial hemorrhage. No acute large vessel territory infarct. No mass lesion, midline shift or mass effect. No hydrocephalus or extra-axial fluid collection. Vascular: No abnormal hyperdense vessel. Calcified atherosclerosis present at the skull base. Skull: Scalp soft tissues and calvarium within normal limits. Sinuses/Orbits: Question mild left periorbital soft tissue swelling, of uncertain significance (series 4, image 7). Globes and orbital soft tissues demonstrate no other acute finding. Chronic left sphenoid sinusitis noted. Paranasal sinuses are otherwise clear. Trace left mastoid effusion, of doubtful significance. Other: None. IMPRESSION: 1. No acute intracranial abnormality. 2. Moderate chronic small vessel ischemic disease. 3. Question mild left periorbital soft tissue swelling, of uncertain significance. Correlation with physical exam recommended. 4. Chronic left sphenoid sinusitis. Electronically Signed   By: Jeannine Boga M.D.   On: 07/03/2022 23:03   [place] Lower Ext Art Right Ltd  Result Date: 07/03/2022 CLINICAL DATA:  76 year old female with history of hematoma after coronary catheterization on 07/05/2022 EXAM: Limited RIGHT LOWER EXTREMITY ARTERIAL DUPLEX SCAN TECHNIQUE: Gray-scale sonography as well as color Doppler and duplex ultrasound was performed to evaluate the lower extremity arteries including the common, superficial and profunda femoral arteries, popliteal artery and calf arteries. COMPARISON:  CTA abdomen pelvis from 07/03/2022. FINDINGS: About the anterior aspect of the right common femoral artery is a partially thrombosed pseudoaneurysm measuring approximately 1.9 x 0.4 x 1.3 cm. Color Doppler demonstrates central patency. There is a short  neck measuring approximately 2.9 mm in length, 2.0 mm in with. The common femoral artery appears patent proximal and distal to the pseudoaneurysm. IMPRESSION: Access site pseudoaneurysm measuring up to 1.9 cm just anterior to the right common femoral artery. [name], MD [facility] Radiology Electronically Signed   By: [name] M.D.   On: 07/03/2022 16:26   DG Chest Port 1 View  Result Date: 07/03/2022 CLINICAL DATA:  Acute respiratory failure. EXAM: PORTABLE CHEST 1 VIEW COMPARISON:  Radiographs 06/30/2022 and 06/25/2022. FINDINGS: 1223 hours. Right-sided PICC previously projected to the level the right atrium; this line now demonstrates acute angulation at the level of the azygous vein and projects to the left, likely within the left brachiocephalic vein. Stable cardiomegaly and aortic atherosclerosis. There is mild vascular congestion with interval improved aeration of the right lung base. No consolidation, pneumothorax or significant pleural effusion identified. Avascular stent is noted in the left axilla. The bones appear unremarkable. Telemetry leads overlie the chest.  IMPRESSION: 1. Interval improved aeration of the right lung base. 2. Interval angulation of the right-sided PICC, tip now likely within the left brachiocephalic vein. 3. Cardiomegaly and mild vascular congestion. Electronically Signed   By: Richardean Sale M.D.   On: 07/03/2022 12:37   CT ANGIO GI BLEED  Result Date: 07/03/2022 CLINICAL DATA:  Mesenteric ischemia, acute EXAM: CTA ABDOMEN AND PELVIS WITHOUT AND WITH CONTRAST TECHNIQUE: Multidetector CT imaging of the abdomen and pelvis was performed using the standard protocol during bolus administration of intravenous contrast. Multiplanar reconstructed images and MIPs were obtained and reviewed to evaluate the vascular anatomy. RADIATION DOSE REDUCTION: This exam was performed according to the departmental dose-optimization  program which includes automated exposure control, adjustment of the mA and/or kV according to patient size and/or use of iterative reconstruction technique. CONTRAST:  158mL OMNIPAQUE IOHEXOL 350 MG/ML SOLN COMPARISON:  None Available. FINDINGS: VASCULAR Aorta: Aortoiliac atherosclerosis.  No aneurysm or dissection. Celiac: Mild stenosis at the celiac origin. SMA: Patent without significant stenosis.  Mixed atherosclerosis. Renals: Patent without significant stenosis.  Mixed atherosclerosis. IMA: Patent without significant stenosis. Inflow: Mixed atherosclerosis. No significant stenosis. No aneurysm or dissection. Proximal Outflow: Mixed atherosclerosis without significant stenosis. There is a focal outpouching of the anterior right common femoral artery with a connecting neck, which does not significantly change on delay. This measures approximately 6 x 4 mm (series 6, image 68). There is adjacent soft tissue thickening/possible hematoma (series 5, image 168). Veins: No obvious venous abnormality. Review of the MIP images confirms the above findings. NON-VASCULAR Lower chest: Cardiomegaly. Trace pleural effusions. Lingular and right basilar atelectasis. Hepatobiliary: No focal liver abnormality. The gallbladder is decompressed with tiny layering internal stones and mild wall thickening. Pancreas: No ductal dilation or peripancreatic inflammatory change. Spleen: Unremarkable. Adrenals/Urinary Tract: Mild left adrenal thickening without discrete nodule. Right adrenal gland is normal. Atrophic left kidney. Multiple renal cysts bilaterally in keeping with history of end-stage renal disease on dialysis. The bladder is mildly distended. Stomach/Bowel: The stomach is within normal limits. There is no evidence of bowel obstruction. There is no evidence of active gastrointestinal bleed. Study is somewhat limited by the presence of hyperattenuating material throughout the transverse and proximal descending colon. Sigmoid  diverticulosis. Lymphatic: No lymphadenopathy. Reproductive: Prior hysterectomy. Other: No ascites.  No free air. Musculoskeletal: No acute osseous abnormality. No suspicious osseous lesion. Multilevel degenerative changes of the spine. IMPRESSION: No CT evidence of active gastrointestinal bleed. Focal outpouching of the anterior right common femoral artery with adjacent soft tissue thickening/possible small hematoma. This could represent a pseudoaneurysm. Recommend targeted ultrasound. No other acute findings in the abdomen or pelvis. Trace pleural effusions.  Cardiomegaly. Electronically Signed   By: Maurine Simmering M.D.   On: 07/03/2022 11:41     Medications:    sodium chloride     dextrose 20 mL/hr at 07/05/22 0700   norepinephrine (LEVOPHED) Adult infusion 9 mcg/min (07/05/22 0700)    Chlorhexidine Gluconate Cloth  6 each Topical Q0600   midodrine  10 mg Oral TID WC   mouth rinse  15 mL Mouth Rinse 4 times per day   pantoprazole (PROTONIX) IV  40 mg Intravenous Q12H   pentafluoroprop-tetrafluoroeth       sodium chloride flush  10-40 mL Intracatheter Q12H   sodium chloride flush  3 mL Intravenous Q12H   sodium chloride flush  3 mL Intravenous Q12H   sodium chloride flush  3 mL Intravenous Q12H   sodium chloride, acetaminophen, fentaNYL (SUBLIMAZE) injection,  guaiFENesin, haloperidol, hydrALAZINE, HYDROmorphone (DILAUDID) injection, ipratropium-albuterol, metoprolol tartrate, ondansetron (ZOFRAN) IV, ondansetron (ZOFRAN) IV, mouth rinse, oxyCODONE, pentafluoroprop-tetrafluoroeth, senna-docusate, sodium chloride flush, sodium chloride flush     Assessment/ Plan:  Ms. Kathleen Sharp is a 76 y.o.  female with past medical conditions including hyperlipidemia, hypertension, cardiomegaly, and end-stage renal disease on hemodialysis.  Patient presents to the emergency department with complaints of shortness of breath and has been admitted for Hyperkalemia [E87.5] SOB (shortness of breath)  [R06.02] Tachyarrhythmia [R00.0] Volume overload [E87.70]  [facility] [place]/M/F/left AVG/82 kg  Volume overload/hyperkalemia with end-stage renal disease on hemodialysis.  Potassium on ED arrival 5.9, increased to 6.2 after pharmacologic treatment. Patient hyperkalemia is now better          Patient is on Monday and Friday schedule as an outpatient.          Patient will need renal placement therapy today     2. Anemia of chronic kidney disease Lab Results  Component Value Date   HGB 8.0 (L) 07/05/2022   HGB 7.9 (L) 07/05/2022    Patient hemoglobin now better We will follow with primary team/ICU team regarding need for PRBC. Patient did receive PRBC yesterday  3. Secondary Hyperparathyroidism: with outpatient labs: PTH 798, phosphorus 8.5, calcium 8.0 on 04/29/22.   Lab Results  Component Value Date   CALCIUM 8.2 (L) 07/05/2022   PHOS 4.3 07/05/2022     Continue sevelamer ordered with meals.  4.  Hypotensive  Patient was on pressors Cardiology is following closely  5.  NSTEMI with elevated troponin and ST depression with SVT on EKG.       Patient was earlier on IV heparin Patient is being closely followed by primary team as well as cardiology Patient had  cardiac catheterization - its showed minor luminal irregularities about 30% in LAD circumflex and RCA with no significant coronary artery disease  6.Altered mental status/ICU delirium. Patient primary team is following Patient is now clinically better  7.  Pseudoaneurysm following procedure Patient was found to have pseudoaneurysm of right femoral artery Vascular team is following   LOS: 11 Jhan Conery s Jahred Tatar 10/20/20238:06 AM

## 2022-07-05 NOTE — Progress Notes (Addendum)
PROGRESS NOTE    Kathleen Sharp  AGT:[id number] DOB: October 15, 1945 DOA: 06/28/2022 PCP: Adalberto Ill, MD   Brief Narrative:  76 year old with history of ESRD on HD MWF, HTN, cardiomegaly, HLD presented to the ED with shortness of breath.  COVID/influenza were negative, troponins of 89, lactic acidosis.  Overnight had developed SVT and troponin peaked greater than 14,000.  Patient was initially started on heparin drip and later refused cardiac catheterization.  Hospital course was also complicated by syncopal episode.  CTA of the chest and CT of head and neck were negative for acute pathology.  Echocardiogram showed normal EF.  Due to arrhythmia amiodarone was planned by cardiology team.  Eventually agreed to cardiac cath which showed nonobstructive CAD.  Hospital course complicated by acute blood loss anemia and right groin pseudoaneurysm for which vascular was consulted and stent was placed by their service.  Off-and-on she is also had low blood pressure requiring pressors, ICU team is following the patient.   Assessment & Plan:  Principal Problem:   NSTEMI (non-ST elevated myocardial infarction) ([place]) Active Problems:   Cardiac arrhythmia   Syncope and collapse   Pulmonary edema   Volume overload   Metabolic encephalopathy   Hyperkalemia   End stage renal disease (HCC)   Essential hypertension   Anemia of chronic renal failure, stage 5 (HCC)   Anemia, posthemorrhagic, acute   Pseudoaneurysm following procedure (HCC)    NSTEMI (non-ST elevated myocardial infarction) ([place]) -Troponin peaked greater than 14K.  Initially started on heparin drip and patient refused left heart catheterization which was eventually performed on 10/16 as patient was agreeable which revealed nonobstructive CAD.  Echo showed EF of 55%.  Eliquis and Plavix currently on hold   Acute anemia with hematemesis Hemorrhagic shock - Hemoglobin has drifted down.  CTA of the abdomen pelvis which is negative for any  active bleed but shows focal outpouching of the right common femoral artery suspecting hematoma versus pseudoaneurysm.  Holding Eliquis and Plavix.  No further intervention by GI at this time. Off-and-on requiring pressors, on midodrine as well. 1 more U PRBC today, HB 7.5 but no active bleed noted for now Difficult to know if she truly is hypotensive or has artificially low reading.  Either way her mentation remained stable  Right groin hematoma/pseudoaneurysm 1.9cm - Status post stent placement by vascular on 10/19.  Abnormal breath sounds - Advised nursing staff to give bronchodilator treatment and incentive spirometer.  Out of bed to chair  Cardiac arrhythmia For now this appears to have resolved, seen by cardiology.   Syncope and collapse Delirium/intermittent altered mental status. CT head was negative for any acute infarct.  Chronic lacunar infarcts noted. CTA head and neck was negative for any large vessel occlusion.  Some stenosis noted as mentioned in full report. -Continue to monitor    Metabolic encephalopathy Off and on.     Volume overload - Mainly managed with hemodialysis   Hyperkalemia Risk called, continue to monitor   End stage renal disease ([place]) Has a left upper extremity AV fistula.  Nephrology following.   Essential hypertension - Hold antihypertensives at this time.  IV as needed ordered.  Unable to get good blood pressure   DVT prophylaxis: Place and maintain sequential compression device Start: 07/03/22 1342 Place TED hose Start: 06/26/2022 1838 Code Status:  Family Communication:  Spoke with [name] today, very appreciative of this update.   Critically ill, on pressors, getting midodrine.    Subjective: Seen and examined at  bedside.  Tells me she feels hungry and getting ready to eat her breakfast.  Does have some cough and congestion otherwise no other complaints. I spoke also spoke with the nursing staff at bedside, off-and-on she has had  readings of hypotension but patient remains asymptomatic during this time.  Examination:  Constitutional: Not in acute distress.  Chronically ill-appearing Respiratory: Clear to auscultation bilaterally Cardiovascular: Normal sinus rhythm, no rubs Abdomen: Nontender nondistended good bowel sounds Musculoskeletal: No edema noted Skin: No rashes seen Neurologic: CN 2-12 grossly intact.  And nonfocal Psychiatric: Normal judgment and insight. Alert and oriented x 3. Normal mood.  Objective: Vitals:   07/05/22 0635 07/05/22 0645 07/05/22 0656 07/05/22 0800  BP:  (!) 53/31 (!) 85/60 99/74  Pulse: 94 95 82 92  Resp: 16 (!) 28 (!) 25 (!) 24  Temp:    98.1 F (36.7 C)  TempSrc:    Axillary  SpO2: 98% 94% 99% 96%  Weight:      Height:        Intake/Output Summary (Last 24 hours) at 07/05/2022 1136 Last data filed at 07/05/2022 0700 Gross per 24 hour  Intake 748.24 ml  Output --  Net 748.24 ml   Filed Weights   06/29/22 0415 07/03/22 1530 07/03/22 1914  Weight: 85.5 kg 86.8 kg 86.4 kg     Data Reviewed:   CBC: Recent Labs  Lab 06/25/2022 0403 07/02/22 0445 07/02/22 1603 07/03/22 0353 07/03/22 1153 07/03/22 1800 07/05/2022 0540 07/10/2022 1222 06/28/2022 1713 07/05/22 0005 07/05/22 0356  WBC 10.7* 11.0*  --  18.5* 19.5*  --  27.4*  --   --   --  22.8*  NEUTROABS 7.9* 8.0*  --  12.2*  --   --  20.7*  --   --   --  17.6*  HGB 8.1* 7.3*   < > 6.3* 7.1*   < > 7.1* 9.1* 8.5* 7.9* 7.9*  8.0*  HCT 25.8* 23.9*   < > 19.9* 21.9*   < > 21.0* 27.2* 25.1* 23.6* 23.5*  24.3*  MCV 81.6 83.3  --  81.6 83.0  --  82.7  --   --   --  85.0  PLT 162 147*  --  141* 124*  --  125*  --   --   --  128*   < > = values in this interval not displayed.   Basic Metabolic Panel: Recent Labs  Lab 06/26/2022 0403 07/02/22 0445 07/03/22 0353 07/03/2022 0540 06/28/2022 1222 07/05/22 0356  NA 132* 135 133* 133*  --  132*  K 4.3 3.6 4.1 3.5  --  4.4  CL 94* 98 98 95*  --  93*  CO2 $Re'28 29 27 27  'sAS$ --  26   GLUCOSE 100* 100* 103* 144* 120* 125*  BUN 41* 33* 96* 80*  --  86*  CREATININE 7.37* 4.69* 6.08* 4.26*  --  5.55*  CALCIUM 8.5* 8.3* 8.1* 7.8*  --  8.2*  MG 2.5* 1.9 1.9 1.6*  --  2.1  PHOS 5.2* 3.5 2.7 2.9  --  4.3   GFR: Estimated Creatinine Clearance: 9.1 mL/min (A) (by C-G formula based on SCr of 5.55 mg/dL (H)). Liver Function Tests: Recent Labs  Lab 06/30/22 0555  AST 29  ALT 124*  ALKPHOS 52  BILITOT 0.4  PROT 6.4*  ALBUMIN 3.7   No results for input(s): "LIPASE", "AMYLASE" in the last 168 hours. No results for input(s): "AMMONIA" in the last 168 hours. Coagulation Profile:  Recent Labs  Lab 07/03/22 1530  INR 2.2*   Cardiac Enzymes: No results for input(s): "CKTOTAL", "CKMB", "CKMBINDEX", "TROPONINI" in the last 168 hours. BNP (last 3 results) No results for input(s): "PROBNP" in the last 8760 hours. HbA1C: No results for input(s): "HGBA1C" in the last 72 hours. CBG: Recent Labs  Lab 07/05/22 0402 07/05/22 0616 07/05/22 0856 07/05/22 0858 07/05/22 1103  GLUCAP 133* 123* 25* 109* 116*   Lipid Profile: No results for input(s): "CHOL", "HDL", "LDLCALC", "TRIG", "CHOLHDL", "LDLDIRECT" in the last 72 hours. Thyroid Function Tests: No results for input(s): "TSH", "T4TOTAL", "FREET4", "T3FREE", "THYROIDAB" in the last 72 hours. Anemia Panel: No results for input(s): "VITAMINB12", "FOLATE", "FERRITIN", "TIBC", "IRON", "RETICCTPCT" in the last 72 hours. Sepsis Labs: Recent Labs  Lab 07/03/22 1155 07/03/22 1530  LATICACIDVEN 1.3 1.3    No results found for this or any previous visit (from the past 240 hour(s)).        Radiology Studies: PERIPHERAL VASCULAR CATHETERIZATION  Result Date: 07/09/2022 See surgical note for result.  CT HEAD WO CONTRAST (5MM)  Result Date: 07/03/2022 CLINICAL DATA:  Initial evaluation for mental status change, unknown cause. EXAM: CT HEAD WITHOUT CONTRAST TECHNIQUE: Contiguous axial images were obtained from the base  of the skull through the vertex without intravenous contrast. RADIATION DOSE REDUCTION: This exam was performed according to the departmental dose-optimization program which includes automated exposure control, adjustment of the mA and/or kV according to patient size and/or use of iterative reconstruction technique. COMPARISON:  Prior study from 06/25/2022. FINDINGS: Brain: Cerebral volume within normal limits for age. Patchy multifocal hypodensities involving the supratentorial cerebral white matter, consistent with chronic small vessel ischemic disease. No acute intracranial hemorrhage. No acute large vessel territory infarct. No mass lesion, midline shift or mass effect. No hydrocephalus or extra-axial fluid collection. Vascular: No abnormal hyperdense vessel. Calcified atherosclerosis present at the skull base. Skull: Scalp soft tissues and calvarium within normal limits. Sinuses/Orbits: Question mild left periorbital soft tissue swelling, of uncertain significance (series 4, image 7). Globes and orbital soft tissues demonstrate no other acute finding. Chronic left sphenoid sinusitis noted. Paranasal sinuses are otherwise clear. Trace left mastoid effusion, of doubtful significance. Other: None. IMPRESSION: 1. No acute intracranial abnormality. 2. Moderate chronic small vessel ischemic disease. 3. Question mild left periorbital soft tissue swelling, of uncertain significance. Correlation with physical exam recommended. 4. Chronic left sphenoid sinusitis. Electronically Signed   By: Jeannine Boga M.D.   On: 07/03/2022 23:03   [place] Lower Ext Art Right Ltd  Result Date: 07/03/2022 CLINICAL DATA:  76 year old female with history of hematoma after coronary catheterization on 06/29/2022 EXAM: Limited RIGHT LOWER EXTREMITY ARTERIAL DUPLEX SCAN TECHNIQUE: Gray-scale sonography as well as color Doppler and duplex ultrasound was performed to evaluate the lower extremity arteries including the common, superficial  and profunda femoral arteries, popliteal artery and calf arteries. COMPARISON:  CTA abdomen pelvis from 07/03/2022. FINDINGS: About the anterior aspect of the right common femoral artery is a partially thrombosed pseudoaneurysm measuring approximately 1.9 x 0.4 x 1.3 cm. Color Doppler demonstrates central patency. There is a short neck measuring approximately 2.9 mm in length, 2.0 mm in with. The common femoral artery appears patent proximal and distal to the pseudoaneurysm. IMPRESSION: Access site pseudoaneurysm measuring up to 1.9 cm just anterior to the right common femoral artery. [name], MD [facility] Radiology Electronically Signed   By: [name] M.D.   On: 07/03/2022 16:26   DG Chest  Port 1 View  Result Date: 07/03/2022 CLINICAL DATA:  Acute respiratory failure. EXAM: PORTABLE CHEST 1 VIEW COMPARISON:  Radiographs 06/30/2022 and 06/25/2022. FINDINGS: 1223 hours. Right-sided PICC previously projected to the level the right atrium; this line now demonstrates acute angulation at the level of the azygous vein and projects to the left, likely within the left brachiocephalic vein. Stable cardiomegaly and aortic atherosclerosis. There is mild vascular congestion with interval improved aeration of the right lung base. No consolidation, pneumothorax or significant pleural effusion identified. Avascular stent is noted in the left axilla. The bones appear unremarkable. Telemetry leads overlie the chest. IMPRESSION: 1. Interval improved aeration of the right lung base. 2. Interval angulation of the right-sided PICC, tip now likely within the left brachiocephalic vein. 3. Cardiomegaly and mild vascular congestion. Electronically Signed   By: Richardean Sale M.D.   On: 07/03/2022 12:37        Scheduled Meds:  Chlorhexidine Gluconate Cloth  6 each Topical Q0600   midodrine  10 mg Oral TID WC   midodrine  5 mg Oral Q M,W,F-HD   mouth rinse  15 mL  Mouth Rinse 4 times per day   pantoprazole (PROTONIX) IV  40 mg Intravenous Q12H   pentafluoroprop-tetrafluoroeth       sodium chloride flush  10-40 mL Intracatheter Q12H   sodium chloride flush  3 mL Intravenous Q12H   sodium chloride flush  3 mL Intravenous Q12H   sodium chloride flush  3 mL Intravenous Q12H   Continuous Infusions:  sodium chloride     dextrose 20 mL/hr at 07/05/22 0700   norepinephrine (LEVOPHED) Adult infusion 9 mcg/min (07/05/22 0700)     LOS: 11 days   Time spent= 35 mins    Leniya Breit Arsenio Loader, MD [facility]  If 7PM-7AM, please contact night-coverage  07/05/2022, 11:36 AM

## 2022-07-05 NOTE — Progress Notes (Signed)
NAME:  Kathleen Sharp, MRN:  [id number], DOB:  1946-02-06, LOS: 64 ADMISSION DATE:  06/28/2022, CONSULTATION DATE:  06/30/22 REFERRING MD:  Sharion Settler  REASON FOR CONSULT:  Hypotension    HPI  76 y.o  female with significant PMH of HTN, HLD, Cardiomegaly, ESRD on HD MWF who presented to the ED with chief complaints of SOB   ED Course: Initial vital signs showed HR of 95 beats/minute, BP mm Hg, the RR 29 breaths/minute, and the oxygen saturation 96 % on  4L and a temperature of 97.52F.   Pertinent Labs/Diagnostics Findings: Chemistry:Na+/ K+: 135/5.9 Glucose:172  BUN/Cr.:63/9.33 CBC: WBC:5.9  Hgb:11.8 Plts: 269 Other Lab findings: Lactic acid: 4.1 COVID PCR: Negative, Troponin: 89  Imaging:  CXR>1. Moderate pulmonary edema. 2. Moderate left and small right pleural effusions. 3. Cardiomegaly.uconate, vancomycin and cefepime Medications Administered: Diltiazem 10 mg IV one-time dose for SVT, Lokelma p.o., insulin aspart 10 mg, D50, calcium glI  Patient was admitted to hospitalist service for further management. See significant events for hospital course  Past Medical History  HTN, HLD, Cardiomegaly, ESRD on HD MWF  Significant Hospital Events   10/8: Admitted to PCU with Acute respiratory failure in the setting of pulmonary edema. Patient went into SVT requiring Diltiazem IV push  and Hyperkalemia correction. She was also started on Heparin gtt for NSTEMI. 10/9:Cardiology consulted who recommended cath but patient declined. Started on Plavix pending Echo. Nephrology consulted s/p urgent HD for volume removal. Patient had a syncopal episode. EF normal on Echo. 10/10:Started on IV amiodarone gtt for nonsustained ventricular tachycardia followed by atrial fibrillation associated with dizziness spell.CT Chest/CTA negative for CVA 10/11:Post HD 2078ml fluid removed 10/12: Rapid response called for altered mental status and hypoglycemia, VBG showed hypercapnia, patient placed on BiPAP.  Patient initially declined cath but later consented however due to change in mental status cardiac cath was cancelled. 10/13:Remained on Amiodarone gtt.  10/14:Pt completed 2.15 hours of 3.5 hour HD treatment. 700 ml removed patient became hypotensive but completed session. 10/15: Rapid response called for unresponsiveness, bradycardia and hypotension. Noted with cool extremities. Albumin administered, Amiodarone stopped and Atropine 0.5 mg administered x 2. Patient transferred to  the ICU. PCCM consulted. On arrival, art line placed which showed BP readings in the 754'G systolic with BEE>10. Pressors held. Remains on BiPAP  10/18: PCCM reconsulted pt developed acute respiratory failure secondary to pulmonary edema currently requiring 2L O2 via nasal canula.  Pt pending hemodialysis session  10/19: Overnight pt had episodes of hypoglycemia currently requiring D10W $RemoveBefore'@40'CjSSQYTHIVUEP$  ml/hr. Plans for vascular surgery to perform intervention of right femoral pseudoaneurysm. Pt requiring levophed gtt and prn blood transfusions due to acute blood loss anemia.  Pt with continued acute respiratory failure 10/20 remains on pressors   Consults:  Nephrology Cardiology  Procedures:  Cardiac Catheterization 10/16:  Prox RCA lesion is 30% stenosed. Dist Cx lesion is 30% stenosed.Dist LAD lesion is 30% stenosed. The left ventricular systolic function is normal. The left ventricular ejection fraction is 50-55% by visual estimate.  Significant Diagnostic Tests:  06/25/22: CT head: No evidence of acute intracranial abnormality. Moderate chronic small ischemic disease within the cerebral white matter. Small chronic lacunar infarct within the left caudate head. Mild generalized cerebral atrophy. Left sphenoid sinusitis.   06/25/22: CTA neck: The common carotid and internal carotid arteries are patent within the neck without hemodynamically significant stenosis. Atherosclerotic plaque, bilaterally. Vertebral arteries patent  within the neck. Mild atherosclerotic narrowing at the origin of the  dominant right vertebral artery. Aortic Atherosclerosis (ICD10-I70.0) and Emphysema (ICD10-J43.9).   06/25/22: CTA head: No intracranial large vessel occlusion is identified. Intracranial atherosclerotic disease with multifocal stenoses, most notably as follows. Severe stenosis within the left vertebral artery proximal V4 segment. Mild-to-moderate stenosis within the paraclinoid right ICA.  06/25/22: CXR: Slightly improved aeration of bilateral lungs with persistent interstitial pulmonary opacities, most consistent with pulmonary edema. Small bilateral pleural effusions.Cardiomegaly.  10/18: CTA GI Bleed: No CT evidence of active gastrointestinal bleed. Focal outpouching of the anterior right common femoral artery with adjacent soft tissue thickening/possible small hematoma. This could represent a pseudoaneurysm.Recommend targeted ultrasound. No other acute findings in the abdomen or pelvis. Trace pleural effusions.  Cardiomegaly  10/18: [place] ART RLE: Access site pseudoaneurysm measuring up to 1.9 cm just anterior to the right common femoral artery.  10/19 S/P Covered stent placement to right common femoral artery with 8 mm diameter by 5 cm length Viabahn stent to exclude the pseudoaneurysm   Micro Data:  10/8: SARS-CoV-2 PCR> negative 10/8: Influenza PCR> negative 10/9: MRSA PCR>>negative  Antimicrobials:  None  OBJECTIVE  Blood pressure (!) 85/60, pulse 82, temperature 98 F (36.7 C), temperature source Temporal, resp. rate (!) 25, height $RemoveBe'5\' 2"'bfulTVvnI$  (1.575 m), weight 86.4 kg, SpO2 99 %.        Intake/Output Summary (Last 24 hours) at 07/05/2022 0734 Last data filed at 07/05/2022 0700 Gross per 24 hour  Intake 1234.09 ml  Output --  Net 1234.09 ml    Filed Weights   06/29/22 0415 07/03/22 1530 07/03/22 1914  Weight: 85.5 kg 86.8 kg 86.4 kg    Physical Examination  GENERAL: Acute on chronically-ill appearing female   LUNGS: Diffuse rhonchi throughout, mild tachypnea  CARDIOVASCULAR: no murmurs s1, s2 reg rate ABDOMEN: +BS x4, obese, soft, non tender, non distended   EXTREMITIES: LUE fistula +bruit/thrill   NEUROLOGIC: Alert and confused to situation at times. Following commands  SKIN: No obvious rash, lesion, or ulcer.      Review of Systems: Gen:  Denies  fever, sweats, chills weight loss  HEENT: Denies blurred vision, double vision, ear pain, eye pain, hearing loss, nose bleeds, sore throat Cardiac:  No dizziness, chest pain or heaviness, chest tightness,edema, Other:  All other systems negative  Labs/imaging that I havepersonally reviewed  (right click and "Reselect all SmartList Selections" daily)     Labs   CBC: Recent Labs  Lab 06/24/2022 0403 07/02/22 0445 07/02/22 1603 07/03/22 0353 07/03/22 1153 07/03/22 1800 07/10/2022 0540 07/12/2022 1222 06/17/2022 1713 07/05/22 0005 07/05/22 0356  WBC 10.7* 11.0*  --  18.5* 19.5*  --  27.4*  --   --   --  22.8*  NEUTROABS 7.9* 8.0*  --  12.2*  --   --  20.7*  --   --   --  17.6*  HGB 8.1* 7.3*   < > 6.3* 7.1*   < > 7.1* 9.1* 8.5* 7.9* 7.9*  8.0*  HCT 25.8* 23.9*   < > 19.9* 21.9*   < > 21.0* 27.2* 25.1* 23.6* 23.5*  24.3*  MCV 81.6 83.3  --  81.6 83.0  --  82.7  --   --   --  85.0  PLT 162 147*  --  141* 124*  --  125*  --   --   --  128*   < > = values in this interval not displayed.     Basic Metabolic Panel: Recent Labs  Lab 07/05/2022 0403 07/02/22 0445  07/03/22 0353 07/05/2022 0540 07/01/2022 1222 07/05/22 0356  NA 132* 135 133* 133*  --  132*  K 4.3 3.6 4.1 3.5  --  4.4  CL 94* 98 98 95*  --  93*  CO2 $Re'28 29 27 27  'uAT$ --  26  GLUCOSE 100* 100* 103* 144* 120* 125*  BUN 41* 33* 96* 80*  --  86*  CREATININE 7.37* 4.69* 6.08* 4.26*  --  5.55*  CALCIUM 8.5* 8.3* 8.1* 7.8*  --  8.2*  MG 2.5* 1.9 1.9 1.6*  --  2.1  PHOS 5.2* 3.5 2.7 2.9  --  4.3    GFR: Estimated Creatinine Clearance: 9.1 mL/min (A) (by C-G formula based on SCr of  5.55 mg/dL (H)). Recent Labs  Lab 07/03/22 0353 07/03/22 1153 07/03/22 1155 07/03/22 1530 07/11/2022 0540 07/05/22 0356  WBC 18.5* 19.5*  --   --  27.4* 22.8*  LATICACIDVEN  --   --  1.3 1.3  --   --      Liver Function Tests: Recent Labs  Lab 06/30/22 0555  AST 29  ALT 124*  ALKPHOS 52  BILITOT 0.4  PROT 6.4*  ALBUMIN 3.7    No results for input(s): "LIPASE", "AMYLASE" in the last 168 hours. No results for input(s): "AMMONIA" in the last 168 hours.  ABG    Component Value Date/Time   PHART 7.35 06/30/2022 0730   PCO2ART 52 (H) 06/30/2022 0730   PO2ART 196 (H) 06/30/2022 0730   HCO3 28.7 (H) 06/30/2022 0730   O2SAT 100 06/30/2022 0730     Coagulation Profile: Recent Labs  Lab 07/03/22 1530  INR 2.2*      Cardiac Enzymes: No results for input(s): "CKTOTAL", "CKMB", "CKMBINDEX", "TROPONINI" in the last 168 hours.  HbA1C: No results found for: "HGBA1C"  CBG: Recent Labs  Lab 07/02/2022 2159 07/06/2022 2354 07/05/22 0152 07/05/22 0402 07/05/22 0616  GLUCAP 135* 119* 131* 133* 123*   Home Medications  Prior to Admission medications   Medication Sig Start Date End Date Taking? Authorizing Provider  carvedilol (COREG) 6.25 MG tablet Take 6.25 mg by mouth 2 (two) times daily with a meal.   Yes [provider]  clopidogrel (PLAVIX) 75 MG tablet Take 75 mg by mouth daily.   Yes [provider]  lidocaine-prilocaine (EMLA) cream Apply 1 Application topically daily as needed (dialysis treatment).   Yes [provider]  lisinopril (ZESTRIL) 40 MG tablet Take 40 mg by mouth daily.   Yes [provider]  loratadine (CLARITIN) 10 MG tablet Take 10 mg by mouth daily.   Yes [provider]  Multiple Vitamins-Minerals (MULTIVITAMIN WITH MINERALS) tablet Take 1 tablet by mouth daily.   Yes [provider]  sevelamer carbonate (RENVELA) 800 MG tablet Take 1,600 mg by mouth 3 (three) times daily with meals.   Yes  [provider]    Scheduled Meds:  Chlorhexidine Gluconate Cloth  6 each Topical Q0600   midodrine  10 mg Oral TID WC   mouth rinse  15 mL Mouth Rinse 4 times per day   pantoprazole (PROTONIX) IV  40 mg Intravenous Q12H   pentafluoroprop-tetrafluoroeth       sodium chloride flush  10-40 mL Intracatheter Q12H   sodium chloride flush  3 mL Intravenous Q12H   sodium chloride flush  3 mL Intravenous Q12H   sodium chloride flush  3 mL Intravenous Q12H   Continuous Infusions:  sodium chloride     dextrose 20 mL/hr  at 07/05/22 0700   norepinephrine (LEVOPHED) Adult infusion 9 mcg/min (07/05/22 0700)   PRN Meds:.sodium chloride, acetaminophen, fentaNYL (SUBLIMAZE) injection, guaiFENesin, haloperidol, hydrALAZINE, HYDROmorphone (DILAUDID) injection, ipratropium-albuterol, metoprolol tartrate, ondansetron (ZOFRAN) IV, ondansetron (ZOFRAN) IV, mouth rinse, oxyCODONE, pentafluoroprop-tetrafluoroeth, senna-docusate, sodium chloride flush, sodium chloride flush   Active Hospital Problem list       Assessment & Plan:  Acute hypoxic hypercapnic respiratory failure secondary to pulmonary edema-slowly improving - Supplemental O2 as needed to maintain O2 saturations 88 to 92% - Prn Bipap for dyspnea and/or hypoxia      NSTEMI Hypotension secondary to acute blood loss anemia  PMHx: CAD, HLD, HTN   Cardiac Cath 10/16: minor luminal irregularities about 30% in LAD circumflex and RCA with no significant coronary artery disease.  EF 50-55% - Continuous cardiac monitoring - Vasopressors as needed to maintain MAP >55 - Cardiology following, appreciate input   ESRD on HD MWF -continue Foley Catheter-assess need -Avoid nephrotoxic agents -Follow urine output, BMP -Ensure adequate renal perfusion, optimize oxygenation -Renal dose medications   Intake/Output Summary (Last 24 hours) at 07/05/2022 0738 Last data filed at 07/05/2022 0700 Gross per 24 hour  Intake 1234.09 ml  Output --   Net 1234.09 ml      Latest Ref Rng & Units 07/05/2022    3:56 AM 06/30/2022   12:22 PM 06/23/2022    5:40 AM  BMP  Glucose 70 - 99 mg/dL 125  120  144   BUN 8 - 23 mg/dL 86   80   Creatinine 0.44 - 1.00 mg/dL 5.55   4.26   Sodium 135 - 145 mmol/L 132   133   Potassium 3.5 - 5.1 mmol/L 4.4   3.5   Chloride 98 - 111 mmol/L 93   95   CO2 22 - 32 mmol/L 26   27   Calcium 8.9 - 10.3 mg/dL 8.2   7.8      Hypomagnesia  - Trend BMP  - Replace electrolytes as indicated   ABLA Acute blood loss anemia secondary to right common femoral artery pseudoaneurysm  Thrombocytopenia   - Trend CBC - Monitor for s/sx of bleeding and transfuse for hgb <7 - Vascular surgery consulted pt to undergo vascular repair of pseudoaneurysm   Acute Metabolic Encephalopathy due to above Slowly resolving   CT Head 10/10: negative for acute intracranial abnormality - Provide supportive care   Hypoglycemic Episodes - CBGs - Follow ICU hyper/hypoglycemia protocol  Best practice:  Diet:  NPO Pain/Anxiety/Delirium protocol (if indicated): No VAP protocol (if indicated): Not indicated DVT prophylaxis: SCD's GI prophylaxis: N/A Glucose control: N/A Central venous access: Right upper arm PICC line  Arterial line: N/A Foley: N/A Mobility:  bed rest  PT consulted: N/A Last date of multidisciplinary goals of care discussion [06/17/2022] Code Status: DNR Disposition: ICU    Will ask Palliative Care team to see pt again due to continued decline in pts condition to discuss goals of care      DVT/GI PRX  assessed I Assessed the need for Labs I Assessed the need for Foley I Assessed the need for Central Venous Line Family Discussion when available I Assessed the need for Mobilization I made an Assessment of medications to be adjusted accordingly Safety Risk assessment completed  CASE DISCUSSED IN MULTIDISCIPLINARY ROUNDS WITH ICU TEAM     Critical Care Time devoted to patient care services  described in this note is 35 minutes.  Critical care was necessary to treat /prevent imminent and  life-threatening deterioration.   Corrin Parker, M.D.  [facility]  Medical Director [place] Director [facility] Cardio-Pulmonary Department

## 2022-07-05 NOTE — Progress Notes (Signed)
Pt HD started in patient ICU room. Pt restless, BFR decreased to 350 d/t high and low AP alarms. Will continue to monitor.

## 2022-07-05 NOTE — Progress Notes (Signed)
PT Cancellation Note  Patient Details Name: Kathleen Sharp MRN: [id number] DOB: [birth year]   Cancelled Treatment:    Reason Eval/Treat Not Completed: Patient not medically ready PT orders received, chart reviewed. Pt noted to still be in dialysis. Will f/u as able.   Lavone Nian, PT, DPT 07/05/22, 12:50 PM  Waunita Schooner 07/05/2022, 12:50 PM

## 2022-07-05 NOTE — Progress Notes (Signed)
Kathleen Lame, MD [facility]   [address] [place], [place] [address] Phone: [phone] Fax : [phone]   Subjective: Patient has had no sign of any GI bleeding.  The patient had a stent placed after her cath.  The patient's hemoglobin has decreased slightly without any sign of GI bleeding as the cause.   Objective: Vital signs in last 24 hours: Vitals:   07/05/22 1226 07/05/22 1230 07/05/22 1245 07/05/22 1300  BP:  (!) 88/40 (!) 109/54 (!) 130/28  Pulse:  99 85 86  Resp:  (!) 25 (!) 26 (!) 25  Temp: 98.6 F (37 C)     TempSrc: Oral     SpO2:  94% 90% 91%  Weight:      Height:       Weight change:   Intake/Output Summary (Last 24 hours) at 07/05/2022 1316 Last data filed at 07/05/2022 0700 Gross per 24 hour  Intake 727.91 ml  Output --  Net 727.91 ml     Exam: General: In bed in no apparent distress  Lab Results: $RemoveBefo'@LABTEST2'oLLDCGGBrKz$ @ Micro Results: No results found for this or any previous visit (from the past 240 hour(s)). Studies/Results: PERIPHERAL VASCULAR CATHETERIZATION  Result Date: 07/09/2022 See surgical note for result.  CT HEAD WO CONTRAST (5MM)  Result Date: 07/03/2022 CLINICAL DATA:  Initial evaluation for mental status change, unknown cause. EXAM: CT HEAD WITHOUT CONTRAST TECHNIQUE: Contiguous axial images were obtained from the base of the skull through the vertex without intravenous contrast. RADIATION DOSE REDUCTION: This exam was performed according to the departmental dose-optimization program which includes automated exposure control, adjustment of the mA and/or kV according to patient size and/or use of iterative reconstruction technique. COMPARISON:  Prior study from 06/25/2022. FINDINGS: Brain: Cerebral volume within normal limits for age. Patchy multifocal hypodensities involving the supratentorial cerebral white matter, consistent with chronic small vessel ischemic disease. No acute intracranial hemorrhage. No acute large vessel  territory infarct. No mass lesion, midline shift or mass effect. No hydrocephalus or extra-axial fluid collection. Vascular: No abnormal hyperdense vessel. Calcified atherosclerosis present at the skull base. Skull: Scalp soft tissues and calvarium within normal limits. Sinuses/Orbits: Question mild left periorbital soft tissue swelling, of uncertain significance (series 4, image 7). Globes and orbital soft tissues demonstrate no other acute finding. Chronic left sphenoid sinusitis noted. Paranasal sinuses are otherwise clear. Trace left mastoid effusion, of doubtful significance. Other: None. IMPRESSION: 1. No acute intracranial abnormality. 2. Moderate chronic small vessel ischemic disease. 3. Question mild left periorbital soft tissue swelling, of uncertain significance. Correlation with physical exam recommended. 4. Chronic left sphenoid sinusitis. Electronically Signed   By: Jeannine Boga M.D.   On: 07/03/2022 23:03   [place] Lower Ext Art Right Ltd  Result Date: 07/03/2022 CLINICAL DATA:  76 year old female with history of hematoma after coronary catheterization on 07/07/2022 EXAM: Limited RIGHT LOWER EXTREMITY ARTERIAL DUPLEX SCAN TECHNIQUE: Gray-scale sonography as well as color Doppler and duplex ultrasound was performed to evaluate the lower extremity arteries including the common, superficial and profunda femoral arteries, popliteal artery and calf arteries. COMPARISON:  CTA abdomen pelvis from 07/03/2022. FINDINGS: About the anterior aspect of the right common femoral artery is a partially thrombosed pseudoaneurysm measuring approximately 1.9 x 0.4 x 1.3 cm. Color Doppler demonstrates central patency. There is a short neck measuring approximately 2.9 mm in length, 2.0 mm in with. The common femoral artery appears patent proximal and distal to the pseudoaneurysm. IMPRESSION: Access site pseudoaneurysm measuring up to 1.9  cm just anterior to the right common femoral artery. [name], MD [facility] Electronically Signed   By: [name] M.D.   On: 07/03/2022 16:26   Medications: I have reviewed the patient's current medications. Scheduled Meds:  alteplase  2 mg Intracatheter Once   Chlorhexidine Gluconate Cloth  6 each Topical Q0600   midodrine  10 mg Oral TID WC   midodrine  5 mg Oral Q M,W,F-HD   mouth rinse  15 mL Mouth Rinse 4 times per day   pantoprazole (PROTONIX) IV  40 mg Intravenous Q12H   sodium chloride flush  10-40 mL Intracatheter Q12H   sodium chloride flush  3 mL Intravenous Q12H   sodium chloride flush  3 mL Intravenous Q12H   sodium chloride flush  3 mL Intravenous Q12H   Continuous Infusions:  sodium chloride     dextrose 20 mL/hr at 07/05/22 0700   norepinephrine (LEVOPHED) Adult infusion 9 mcg/min (07/05/22 0700)   PRN Meds:.sodium chloride, acetaminophen, fentaNYL (SUBLIMAZE) injection, guaiFENesin, haloperidol, hydrALAZINE, HYDROmorphone (DILAUDID) injection, ipratropium-albuterol, metoprolol tartrate, ondansetron (ZOFRAN) IV, ondansetron (ZOFRAN) IV, mouth rinse, oxyCODONE, senna-docusate, sodium chloride flush, sodium chloride flush   Assessment: Principal Problem:   NSTEMI (non-ST elevated myocardial infarction) ([place]) Active Problems:   Anemia of chronic renal failure, stage 5 (HCC)   End stage renal disease (HCC)   Essential hypertension   Volume overload   Hyperkalemia   Pulmonary edema   Cardiac arrhythmia   Syncope and collapse   Metabolic encephalopathy   Anemia, posthemorrhagic, acute   Pseudoaneurysm following procedure ([place])    Plan: This patient had significant anemia that is unlikely from a GI cause without any overt GI bleeding seen.  The patient did have a aneurysm that was repaired yesterday which was likely the source of the patient's bleeding.  There is nothing further to do from a GI point of view.  I will sign off.  Please call if any further GI concerns or  questions.  We would like to thank you for the opportunity to participate in the care of Kathleen Sharp.    LOS: 11 days   Kathleen Sharp, Marval Regal 07/05/2022, 1:16 PM Pager [phone] 7am-5pm  Check AMION for 5pm -7am coverage and on weekends

## 2022-07-05 NOTE — Progress Notes (Signed)
SUBJECTIVE: Patient is a 76 year old female with a history of ESRD on dialysis MWF, hypertension, cardiomegaly, hyperlipidemia, who presented to the ED on 07/15/2022 with shortness of breath, palpitations. Denied chest pain at admission. Troponin level peaked at 14,207.   On 10/10, patient had an episode of nonsustained ventricular tachycardia followed by atrial fibrillation associated with dizziness. Patient denies losing consciousness. IV amiodoarone started.    Cardiac cath rescheduled for 10/12, however, patient developed mental status changes, cath was cancelled.    On 10/15 overnight patient became unresponsive, bradycardic, hypotensive. IV amiodarone discontinued. Patient transferred to ICU.    Cardiac cath 07/15/2022 revealed minor luminal irregularities about 30% in LAD circumflex and RCA with no significant coronary artery disease.  LVEF on echo was 55% thus LV gram was deferred   Acute anemia- unclear etiology, Hgb now 6.3. Patient received one unit PRBC. Patient having CT abdomen pelvis this morning.    CT abdomen pelvis on 07/03/22 revealed possible pseudoaneurysm. Ultrasound confirmed a partially thrombosed pseudoaneurysm measuring approximately 1.9 x 0.4 x 1.3 cm.   07/03/2022 Patient underwent right lower extremity angiogram with stent placement to pseudoaneurysm.    Vitals:   07/05/22 0635 07/05/22 0645 07/05/22 0656 07/05/22 0800  BP:  (!) 53/31 (!) 85/60 99/74  Pulse: 94 95 82 92  Resp: 16 (!) 28 (!) 25 (!) 24  Temp:    98.1 F (36.7 C)  TempSrc:    Axillary  SpO2: 98% 94% 99% 96%  Weight:      Height:        Intake/Output Summary (Last 24 hours) at 07/05/2022 0835 Last data filed at 07/05/2022 0700 Gross per 24 hour  Intake 1181.9 ml  Output --  Net 1181.9 ml    LABS: Basic Metabolic Panel: Recent Labs    07/01/2022 0540 07/10/2022 1222 07/05/22 0356  NA 133*  --  132*  K 3.5  --  4.4  CL 95*  --  93*  CO2 27  --  26  GLUCOSE 144* 120* 125*  BUN 80*  --   86*  CREATININE 4.26*  --  5.55*  CALCIUM 7.8*  --  8.2*  MG 1.6*  --  2.1  PHOS 2.9  --  4.3   Liver Function Tests: No results for input(s): "AST", "ALT", "ALKPHOS", "BILITOT", "PROT", "ALBUMIN" in the last 72 hours. No results for input(s): "LIPASE", "AMYLASE" in the last 72 hours. CBC: Recent Labs    07/05/2022 0540 07/11/2022 1222 07/05/22 0005 07/05/22 0356  WBC 27.4*  --   --  22.8*  NEUTROABS 20.7*  --   --  17.6*  HGB 7.1*   < > 7.9* 7.9*  8.0*  HCT 21.0*   < > 23.6* 23.5*  24.3*  MCV 82.7  --   --  85.0  PLT 125*  --   --  128*   < > = values in this interval not displayed.   Cardiac Enzymes: No results for input(s): "CKTOTAL", "CKMB", "CKMBINDEX", "TROPONINI" in the last 72 hours. BNP: Invalid input(s): "POCBNP" D-Dimer: No results for input(s): "DDIMER" in the last 72 hours. Hemoglobin A1C: No results for input(s): "HGBA1C" in the last 72 hours. Fasting Lipid Panel: No results for input(s): "CHOL", "HDL", "LDLCALC", "TRIG", "CHOLHDL", "LDLDIRECT" in the last 72 hours. Thyroid Function Tests: No results for input(s): "TSH", "T4TOTAL", "T3FREE", "THYROIDAB" in the last 72 hours.  Invalid input(s): "FREET3" Anemia Panel: No results for input(s): "VITAMINB12", "FOLATE", "FERRITIN", "TIBC", "IRON", "RETICCTPCT" in the last  72 hours.   PHYSICAL EXAM General: Well developed, well nourished, in no acute distress HEENT:  Normocephalic and atramatic Neck:  No JVD.  Lungs: Clear bilaterally to auscultation and percussion. Heart: HRRR . Normal S1 and S2 without gallops or murmurs.  Abdomen: Bowel sounds are positive, abdomen soft and non-tender  Msk:  Back normal, normal gait. Normal strength and tone for age. Extremities: No clubbing, cyanosis or edema.   Neuro: Alert and oriented X 3. Psych:  Good affect, responds appropriately  TELEMETRY: sinus rhythm, HR 90 bpm  ASSESSMENT AND PLAN: Patient currently resting in bed. Denies chest pain. Shortness of breath  unchanged. Denies right groin pain. Remains on pressors. Will continue to follow.  Principal Problem:   NSTEMI (non-ST elevated myocardial infarction) ([place]) Active Problems:   Anemia of chronic renal failure, stage 5 (HCC)   End stage renal disease (HCC)   Essential hypertension   Volume overload   Hyperkalemia   Pulmonary edema   Cardiac arrhythmia   Syncope and collapse   Metabolic encephalopathy   Anemia, posthemorrhagic, acute   Pseudoaneurysm following procedure ([place])    Maryela Tapper, FNP-C 07/05/2022 8:35 AM

## 2022-07-05 NOTE — Progress Notes (Addendum)
OT Cancellation Note  Patient Details Name: Kathleen Sharp MRN: [id number] DOB: 08-07-1946   Cancelled Treatment:    Reason Eval/Treat Not Completed: Patient at procedure or test/ unavailable;Other (comment) (Consult received, per RN pt getting HD, OT will re-attempt as able.Shanon Payor, OTD OTR/L  07/05/22, 12:51 PM

## 2022-07-06 ENCOUNTER — Inpatient Hospital Stay: Payer: Medicare HMO

## 2022-07-06 DIAGNOSIS — I214 Non-ST elevation (NSTEMI) myocardial infarction: Secondary | ICD-10-CM | POA: Diagnosis not present

## 2022-07-06 LAB — CBC WITH DIFFERENTIAL/PLATELET
Abs Immature Granulocytes: 0.15 10*3/uL — ABNORMAL HIGH (ref 0.00–0.07)
Basophils Absolute: 0 10*3/uL (ref 0.0–0.1)
Basophils Relative: 0 %
Eosinophils Absolute: 0.2 10*3/uL (ref 0.0–0.5)
Eosinophils Relative: 1 %
HCT: 26.6 % — ABNORMAL LOW (ref 36.0–46.0)
Hemoglobin: 8.7 g/dL — ABNORMAL LOW (ref 12.0–15.0)
Immature Granulocytes: 1 %
Lymphocytes Relative: 5 %
Lymphs Abs: 0.9 10*3/uL (ref 0.7–4.0)
MCH: 28 pg (ref 26.0–34.0)
MCHC: 32.7 g/dL (ref 30.0–36.0)
MCV: 85.5 fL (ref 80.0–100.0)
Monocytes Absolute: 2.3 10*3/uL — ABNORMAL HIGH (ref 0.1–1.0)
Monocytes Relative: 13 %
Neutro Abs: 14.5 10*3/uL — ABNORMAL HIGH (ref 1.7–7.7)
Neutrophils Relative %: 80 %
Platelets: 148 10*3/uL — ABNORMAL LOW (ref 150–400)
RBC: 3.11 MIL/uL — ABNORMAL LOW (ref 3.87–5.11)
RDW: 16.6 % — ABNORMAL HIGH (ref 11.5–15.5)
WBC: 18 10*3/uL — ABNORMAL HIGH (ref 4.0–10.5)
nRBC: 0.2 % (ref 0.0–0.2)

## 2022-07-06 LAB — BPAM RBC
Blood Product Expiration Date: 202311182359
Blood Product Expiration Date: 202311182359
Blood Product Expiration Date: 202311182359
Blood Product Expiration Date: 202311202359
Blood Product Expiration Date: 202311202359
Blood Product Expiration Date: 202311232359
ISSUE DATE / TIME: 202310171113
ISSUE DATE / TIME: 202310180507
ISSUE DATE / TIME: 202310182040
ISSUE DATE / TIME: 202310190755
ISSUE DATE / TIME: 202310201653
Unit Type and Rh: 5100
Unit Type and Rh: 5100
Unit Type and Rh: 5100
Unit Type and Rh: 5100
Unit Type and Rh: 5100
Unit Type and Rh: 5100

## 2022-07-06 LAB — BASIC METABOLIC PANEL
Anion gap: 8 (ref 5–15)
BUN: 32 mg/dL — ABNORMAL HIGH (ref 8–23)
CO2: 30 mmol/L (ref 22–32)
Calcium: 7.7 mg/dL — ABNORMAL LOW (ref 8.9–10.3)
Chloride: 96 mmol/L — ABNORMAL LOW (ref 98–111)
Creatinine, Ser: 2.66 mg/dL — ABNORMAL HIGH (ref 0.44–1.00)
GFR, Estimated: 18 mL/min — ABNORMAL LOW (ref >60)
Glucose, Bld: 115 mg/dL — ABNORMAL HIGH (ref 70–99)
Potassium: 3.2 mmol/L — ABNORMAL LOW (ref 3.5–5.1)
Sodium: 134 mmol/L — ABNORMAL LOW (ref 135–145)

## 2022-07-06 LAB — TYPE AND SCREEN
ABO/RH(D): O POS
Antibody Screen: NEGATIVE
Unit division: 0
Unit division: 0
Unit division: 0
Unit division: 0
Unit division: 0
Unit division: 0

## 2022-07-06 LAB — PHOSPHORUS: Phosphorus: 2.2 mg/dL — ABNORMAL LOW (ref 2.5–4.6)

## 2022-07-06 LAB — BLOOD GAS, VENOUS
Acid-Base Excess: 4.5 mmol/L — ABNORMAL HIGH (ref 0.0–2.0)
Bicarbonate: 30.4 mmol/L — ABNORMAL HIGH (ref 20.0–28.0)
O2 Content: 4 L/min
O2 Saturation: 88.5 %
Patient temperature: 37
pCO2, Ven: 49 mmHg (ref 44–60)
pH, Ven: 7.4 (ref 7.25–7.43)
pO2, Ven: 51 mmHg — ABNORMAL HIGH (ref 32–45)

## 2022-07-06 LAB — CBC
HCT: 23.8 % — ABNORMAL LOW (ref 36.0–46.0)
Hemoglobin: 7.8 g/dL — ABNORMAL LOW (ref 12.0–15.0)
MCH: 28.1 pg (ref 26.0–34.0)
MCHC: 32.8 g/dL (ref 30.0–36.0)
MCV: 85.6 fL (ref 80.0–100.0)
Platelets: 127 10*3/uL — ABNORMAL LOW (ref 150–400)
RBC: 2.78 MIL/uL — ABNORMAL LOW (ref 3.87–5.11)
RDW: 16.4 % — ABNORMAL HIGH (ref 11.5–15.5)
WBC: 14.4 10*3/uL — ABNORMAL HIGH (ref 4.0–10.5)
nRBC: 0 % (ref 0.0–0.2)

## 2022-07-06 LAB — RENAL FUNCTION PANEL
Albumin: 2.5 g/dL — ABNORMAL LOW (ref 3.5–5.0)
Anion gap: 7 (ref 5–15)
BUN: 37 mg/dL — ABNORMAL HIGH (ref 8–23)
CO2: 30 mmol/L (ref 22–32)
Calcium: 7.8 mg/dL — ABNORMAL LOW (ref 8.9–10.3)
Chloride: 97 mmol/L — ABNORMAL LOW (ref 98–111)
Creatinine, Ser: 3.58 mg/dL — ABNORMAL HIGH (ref 0.44–1.00)
GFR, Estimated: 13 mL/min — ABNORMAL LOW (ref >60)
Glucose, Bld: 111 mg/dL — ABNORMAL HIGH (ref 70–99)
Phosphorus: 3.4 mg/dL (ref 2.5–4.6)
Potassium: 3.8 mmol/L (ref 3.5–5.1)
Sodium: 134 mmol/L — ABNORMAL LOW (ref 135–145)

## 2022-07-06 LAB — GLUCOSE, CAPILLARY
Glucose-Capillary: 118 mg/dL — ABNORMAL HIGH (ref 70–99)
Glucose-Capillary: 108 mg/dL — ABNORMAL HIGH (ref 70–99)
Glucose-Capillary: 418 mg/dL — ABNORMAL HIGH (ref 70–99)
Glucose-Capillary: 489 mg/dL — ABNORMAL HIGH (ref 70–99)

## 2022-07-06 LAB — HEMOGLOBIN AND HEMATOCRIT, BLOOD
HCT: 23.7 % — ABNORMAL LOW (ref 36.0–46.0)
Hemoglobin: 7.8 g/dL — ABNORMAL LOW (ref 12.0–15.0)

## 2022-07-06 LAB — MAGNESIUM: Magnesium: 1.6 mg/dL — ABNORMAL LOW (ref 1.7–2.4)

## 2022-07-06 MED ORDER — NALOXONE HCL 4 MG/10ML IJ SOLN
0.2500 mg/h | INTRAVENOUS | Status: DC
  Filled 2022-07-06: qty 10

## 2022-07-06 MED ORDER — NALOXONE HCL 0.4 MG/ML IJ SOLN
0.4000 mg | Freq: Once | INTRAMUSCULAR | Status: DC
  Filled 2022-07-06: qty 1

## 2022-07-06 MED ORDER — NALOXONE HCL 0.4 MG/ML IJ SOLN
INTRAMUSCULAR | Status: AC
  Administered 2022-07-06: 0.4 mg
  Filled 2022-07-06: qty 1

## 2022-07-06 MED ORDER — HEPARIN SODIUM (PORCINE) 1000 UNIT/ML DIALYSIS: 1000.0000 [IU] | INTRAMUSCULAR | Status: DC | PRN

## 2022-07-06 MED ORDER — PENTAFLUOROPROP-TETRAFLUOROETH EX AERO: 1.0000 | INHALATION_SPRAY | CUTANEOUS | Status: DC | PRN

## 2022-07-06 MED ORDER — NALOXONE HCL 0.4 MG/ML IJ SOLN
0.4000 mg | Freq: Once | INTRAMUSCULAR | Status: AC
  Administered 2022-07-06: 0.4 mg via INTRAVENOUS

## 2022-07-06 MED ORDER — ALTEPLASE 2 MG IJ SOLR: 2.0000 mg | Freq: Once | INTRAMUSCULAR | Status: DC | PRN

## 2022-07-06 MED ORDER — SODIUM CHLORIDE 0.9 % IV SOLN
3.0000 g | Freq: Every day | INTRAVENOUS | Status: DC
  Administered 2022-07-06 – 2022-07-07 (×2): 3 g via INTRAVENOUS
  Filled 2022-07-06 (×2): qty 3

## 2022-07-06 MED ORDER — LIDOCAINE-PRILOCAINE 2.5-2.5 % EX CREA: 1.0000 | TOPICAL_CREAM | CUTANEOUS | Status: DC | PRN

## 2022-07-06 MED ORDER — LIDOCAINE HCL (PF) 1 % IJ SOLN: 5.0000 mL | INTRAMUSCULAR | Status: DC | PRN

## 2022-07-06 MED ORDER — CHLORHEXIDINE GLUCONATE CLOTH 2 % EX PADS
6.0000 | MEDICATED_PAD | Freq: Every day | CUTANEOUS | Status: DC
  Administered 2022-07-06 – 2022-07-07 (×2): 6 via TOPICAL

## 2022-07-06 MED ORDER — NALOXONE HCL 0.4 MG/ML IJ SOLN: 0.4000 mg | Freq: Once | INTRAMUSCULAR | Status: DC

## 2022-07-06 MED ORDER — ANTICOAGULANT SODIUM CITRATE 4% (200MG/5ML) IV SOLN: 5.0000 mL | Status: DC | PRN

## 2022-07-06 MED ORDER — POTASSIUM CHLORIDE CRYS ER 20 MEQ PO TBCR: 40.0000 meq | EXTENDED_RELEASE_TABLET | Freq: Once | ORAL | Status: DC

## 2022-07-06 MED ORDER — HEPARIN SODIUM (PORCINE) 1000 UNIT/ML IJ SOLN
INTRAMUSCULAR | Status: AC
  Filled 2022-07-06: qty 4

## 2022-07-06 MED ORDER — PENTAFLUOROPROP-TETRAFLUOROETH EX AERO
INHALATION_SPRAY | CUTANEOUS | Status: AC
  Filled 2022-07-06: qty 30

## 2022-07-06 NOTE — Progress Notes (Signed)
PROGRESS NOTE    Kathleen Sharp  ZOX:[id number] DOB: Nov 06, 1945 DOA: 07/01/2022 PCP: Adalberto Ill, MD    Brief Narrative:  76 year old with history of ESRD on HD MWF, HTN, cardiomegaly, HLD presented to the ED with shortness of breath.  COVID/influenza were negative, troponins of 89, lactic acidosis.  Overnight had developed SVT and troponin peaked greater than 14,000.  Patient was initially started on heparin drip and later refused cardiac catheterization.  Hospital course was also complicated by syncopal episode.  CTA of the chest and CT of head and neck were negative for acute pathology.  Echocardiogram showed normal EF.  Due to arrhythmia amiodarone was planned by cardiology team.  Eventually agreed to cardiac cath which showed nonobstructive CAD.  Hospital course complicated by acute blood loss anemia and right groin pseudoaneurysm for which vascular was consulted and stent was placed by their service.  Off-and-on she is also had low blood pressure requiring pressors, ICU team is following the patient.  10/21: Deterioration in mental status noted.  CT head reassuring.  Chest CT with evidence of interstitial edema.  No obvious aspiration however given the deterioration in mental status, normal VBG we suspect an aspiration event.  Started on intravenous Unasyn.  Nephrology contacted and would order ultrafiltration today for fluid removal   Assessment & Plan:   Principal Problem:   NSTEMI (non-ST elevated myocardial infarction) ([place]) Active Problems:   Cardiac arrhythmia   Syncope and collapse   Pulmonary edema   Volume overload   Metabolic encephalopathy   Hyperkalemia   End stage renal disease (HCC)   Essential hypertension   Anemia of chronic renal failure, stage 5 (HCC)   Anemia, posthemorrhagic, acute   Pseudoaneurysm following procedure (HCC)  NSTEMI (non-ST elevated myocardial infarction) ([place]) -Troponin peaked greater than 14K.  Initially started on heparin drip and  patient refused left heart catheterization which was eventually performed on 10/16 as patient was agreeable which revealed nonobstructive CAD.  Echo showed EF of 55%.  Eliquis and Plavix currently on hold   Acute anemia with hematemesis Hemorrhagic shock - Hemoglobin has drifted down.  CTA of the abdomen pelvis which is negative for any active bleed but shows focal outpouching of the right common femoral artery suspecting hematoma versus pseudoaneurysm.  Holding Eliquis and Plavix.  No further intervention by GI at this time. Off-and-on requiring pressors, on midodrine as well. 1 more U PRBC today, HB 7.5 but no active bleed noted for now Difficult to know if she truly is hypotensive or has artificially low reading.   Plan: Vasopressors, Levophed versus midodrine with PCCM support  Acute metabolic encephalopathy Suspected aspiration Patient had deterioration in mental status on 10/21.  Unclear etiology.  VBG reassuring.  CT head negative.  No evidence of acute bleed or CVA.  Suspect aspiration event.  No uremia noted on lab work.  Patient remains markedly fluid overloaded. Plan: IV Unasyn, pharmacy dosing assistance Monitor mental status Avoid sedatives Monitor vitals and fever curve    Right groin hematoma/pseudoaneurysm 1.9cm - Status post stent placement by vascular on 10/19.     Cardiac arrhythmia For now this appears to have resolved, followed by cardiology  Volume overload - Mainly managed with hemodialysis   Hyperkalemia Monitor and address as necessary   End stage renal disease ([place]) Has a left upper extremity AV fistula.  Nephrology following.  Question regarding clotted fistula site.  Patient now has temporary venous dialysis access placed by PCCM team   Essential hypertension Home  antihypertensives have been held   DVT prophylaxis: SCDs Code Status: DNR Family Communication: Disposition Plan: Status is: Inpatient Remains inpatient appropriate because: Multiple  acute issues requiring hospitalization   Level of care: ICU  Consultants:  Cardiology GI PCCM Palliative care  Procedures:  Multiple during hospitalization  Antimicrobials: Unasyn   Subjective: Seen and examined.  Very lethargic this morning.  Weak voice.  Objective: Vitals:   07/06/22 0700 07/06/22 0730 07/06/22 0800 07/06/22 1200  BP: 97/74 (!) 89/45 (!) 142/116 (!) 87/63  Pulse: 79 73 88 96  Resp: $Remo'20 14 18 'eRrlo$ (!) 26  Temp:      TempSrc:      SpO2: 99% 100% 100% 100%  Weight:      Height:        Intake/Output Summary (Last 24 hours) at 07/06/2022 1445 Last data filed at 07/06/2022 0600 Gross per 24 hour  Intake 606.07 ml  Output 1500 ml  Net -893.93 ml   Filed Weights   07/05/22 1211 07/06/22 0300 07/06/22 0600  Weight: 91.3 kg 91.3 kg 89.8 kg    Examination:  General exam: Weak voice.  Appears frail and chronically ill Respiratory system: Crackles bilaterally.  Normal work of breathing.  2 L Cardiovascular system: S1-S2, RRR, no murmurs, 2+ pitting edema BLE Gastrointestinal system: Soft, NT/ND, normal bowel sounds Central nervous system: Lethargic.  Oriented to person.  No focal deficits Extremities: Decreased power symmetrically Skin: No rashes, lesions or ulcers Psychiatry: Judgement and insight appear impaired. Mood & affect flattened.     Data Reviewed: I have personally reviewed following labs and imaging studies  CBC: Recent Labs  Lab 07/02/22 0445 07/02/22 1603 07/03/22 0353 07/03/22 1153 07/03/22 1800 07/07/2022 0540 06/30/2022 1222 07/05/22 0356 07/05/22 1222 07/05/22 2057 07/06/22 0614 07/06/22 1138  WBC 11.0*  --  18.5* 19.5*  --  27.4*  --  22.8*  --   --  18.0* 14.4*  NEUTROABS 8.0*  --  12.2*  --   --  20.7*  --  17.6*  --   --  14.5*  --   HGB 7.3*   < > 6.3* 7.1*   < > 7.1*   < > 7.9*  8.0* 7.5* 8.1* 8.7* 7.8*  HCT 23.9*   < > 19.9* 21.9*   < > 21.0*   < > 23.5*  24.3* 22.5* 24.2* 26.6* 23.8*  MCV 83.3  --  81.6 83.0  --   82.7  --  85.0  --   --  85.5 85.6  PLT 147*  --  141* 124*  --  125*  --  128*  --   --  148* 127*   < > = values in this interval not displayed.   Basic Metabolic Panel: Recent Labs  Lab 07/03/22 0353 06/19/2022 0540 06/27/2022 1222 07/05/22 0356 07/05/22 2057 07/06/22 0614 07/06/22 1138  NA 133* 133*  --  132* 130* 134* 134*  K 4.1 3.5  --  4.4 4.2 3.2* 3.8  CL 98 95*  --  93* 95* 96* 97*  CO2 27 27  --  $R'26 27 30 30  'Yj$ GLUCOSE 103* 144* 120* 125* 111* 115* 111*  BUN 96* 80*  --  86* 70* 32* 37*  CREATININE 6.08* 4.26*  --  5.55* 5.19* 2.66* 3.58*  CALCIUM 8.1* 7.8*  --  8.2* 8.0* 7.7* 7.8*  MG 1.9 1.6*  --  2.1 1.9 1.6*  --   PHOS 2.7 2.9  --  4.3  --  2.2* 3.4   GFR: Estimated Creatinine Clearance: 14.4 mL/min (A) (by C-G formula based on SCr of 3.58 mg/dL (H)). Liver Function Tests: Recent Labs  Lab 06/30/22 0555 07/06/22 1138  AST 29  --   ALT 124*  --   ALKPHOS 52  --   BILITOT 0.4  --   PROT 6.4*  --   ALBUMIN 3.7 2.5*   No results for input(s): "LIPASE", "AMYLASE" in the last 168 hours. No results for input(s): "AMMONIA" in the last 168 hours. Coagulation Profile: Recent Labs  Lab 07/03/22 1530  INR 2.2*   Cardiac Enzymes: No results for input(s): "CKTOTAL", "CKMB", "CKMBINDEX", "TROPONINI" in the last 168 hours. BNP (last 3 results) No results for input(s): "PROBNP" in the last 8760 hours. HbA1C: No results for input(s): "HGBA1C" in the last 72 hours. CBG: Recent Labs  Lab 07/05/22 1644 07/05/22 2013 07/05/22 2307 07/06/22 0357 07/06/22 1142  GLUCAP 111* 126* 116* 118* 108*   Lipid Profile: No results for input(s): "CHOL", "HDL", "LDLCALC", "TRIG", "CHOLHDL", "LDLDIRECT" in the last 72 hours. Thyroid Function Tests: No results for input(s): "TSH", "T4TOTAL", "FREET4", "T3FREE", "THYROIDAB" in the last 72 hours. Anemia Panel: No results for input(s): "VITAMINB12", "FOLATE", "FERRITIN", "TIBC", "IRON", "RETICCTPCT" in the last 72 hours. Sepsis  Labs: Recent Labs  Lab 07/03/22 1155 07/03/22 1530 07/05/22 1645 07/05/22 2057  LATICACIDVEN 1.3 1.3 1.3 1.0    No results found for this or any previous visit (from the past 240 hour(s)).       Radiology Studies: CT CHEST WO CONTRAST  Result Date: 07/06/2022 CLINICAL DATA:  Chronic dyspnea of unclear etiology EXAM: CT CHEST WITHOUT CONTRAST TECHNIQUE: Multidetector CT imaging of the chest was performed following the standard protocol without IV contrast. RADIATION DOSE REDUCTION: This exam was performed according to the departmental dose-optimization program which includes automated exposure control, adjustment of the mA and/or kV according to patient size and/or use of iterative reconstruction technique. COMPARISON:  Chest radiograph dated 07/05/2022 FINDINGS: Cardiovascular: Multichamber cardiomegaly. No significant pericardial fluid/thickening. Aortic atherosclerosis. Coronary artery calcifications. Aortic valvular calcifications. Anatomic variant common origin of the brachiocephalic and common carotid arteries. Mediastinum/Nodes: Partially imaged thyroid gland without nodules meeting criteria for imaging follow-up by size. Normal esophagus. No pathologically enlarged axillary, supraclavicular, mediastinal, or hilar lymph nodes. Lungs/Pleura: The central airways are patent. Crescentic appearance of the lower trachea. Minimal upper lobe predominant centrilobular emphysema. Bilateral upper lobe subpleural reticular opacities. No focal consolidation. 3 mm right upper lobe subpleural pulmonary nodule (3:36). No follow-up needed if patient is low-risk.This recommendation follows the consensus statement: Guidelines for Management of Incidental Pulmonary Nodules Detected on CT Images: From the Fleischner Society 2017; Radiology 2017; 284:228-243. Right basilar relaxation atelectasis adjacent to the pleural effusion. Middle lobe, lingular, and left lower lobe subsegmental atelectasis. Mild  interlobular septal thickening. No pneumothorax. Trace bilateral pleural effusions. Upper abdomen: Punctate calcified granuloma in hepatic segment 2. Cholelithiasis. No adrenal nodules. Bilateral atrophic kidneys. Bilateral cysts, including a peripherally calcified subcentimeter cyst in the right upper lobe. Small volume free fluid. Musculoskeletal: No acute or abnormal lytic or blastic osseous lesions. Diffuse body wall edema. IMPRESSION: 1. Multichamber cardiomegaly with trace bilateral pleural effusions and mild interlobular septal thickening suggestive of mild pulmonary edema. 2. Crescentic appearance of the lower trachea, which can be seen in the setting of tracheomalacia. 3. Bilateral upper lobe subpleural reticular opacities suggest a component of interstitial lung disease. 4. Cholelithiasis. 5. Coronary artery disease. Aortic Atherosclerosis (ICD10-I70.0) and Emphysema (ICD10-J43.9). Electronically Signed  By: Darrin Nipper M.D.   On: 07/06/2022 09:48   CT HEAD WO CONTRAST (5MM)  Result Date: 07/06/2022 CLINICAL DATA:  Mental status change with unknown cause EXAM: CT HEAD WITHOUT CONTRAST TECHNIQUE: Contiguous axial images were obtained from the base of the skull through the vertex without intravenous contrast. RADIATION DOSE REDUCTION: This exam was performed according to the departmental dose-optimization program which includes automated exposure control, adjustment of the mA and/or kV according to patient size and/or use of iterative reconstruction technique. COMPARISON:  07/03/2022 FINDINGS: Brain: No evidence of acute infarction, hemorrhage, hydrocephalus, extra-axial collection or mass lesion/mass effect. Chronic small vessel ischemia in the cerebral white matter. Vascular: No hyperdense vessel or unexpected calcification. Skull: Normal. Negative for fracture or focal lesion. Sinuses/Orbits: Mucosal thickening in the left sphenoid sinus which is chronic and stable. Other: Motion degraded, subtle  findings could be obscured. IMPRESSION: Motion degraded head CT without acute or interval finding. Electronically Signed   By: Jorje Guild M.D.   On: 07/06/2022 09:42   DG Chest 1 View  Result Date: 07/05/2022 CLINICAL DATA:  Shortness of breath EXAM: CHEST  1 VIEW COMPARISON:  07/05/2022, 07/03/2022, 08/14/2020 FINDINGS: Cardiomegaly with vascular congestion and pulmonary edema. Aortic atherosclerosis. Right upper extremity central venous catheter tip at the distal SVC. No pneumothorax IMPRESSION: Cardiomegaly with similar vascular congestion and pulmonary edema. Possible left pleural effusion Electronically Signed   By: Donavan Foil M.D.   On: 07/05/2022 21:11   DG Chest Port 1 View  Result Date: 07/05/2022 CLINICAL DATA:  Acute respiratory failure EXAM: PORTABLE CHEST 1 VIEW COMPARISON:  07/03/2022 FINDINGS: Transverse diameter of heart is increased. Central pulmonary vessels are prominent. There is increase in interstitial markings in parahilar regions Increased density in left lower lung field may be related to cardiomegaly or possibly underlying atelectasis/pneumonia. There is blunting of left lateral CP angle suggesting small effusion. There is no pneumothorax. Tip of PICC line is seen in the region of superior vena cava close to the right atrium. IMPRESSION: Cardiomegaly. Prominence of central pulmonary vessels and increased interstitial markings in parahilar regions suggest CHF. Increased density in left lower lung fields may be due to pleural effusion and possibly underlying infiltrate. Electronically Signed   By: Elmer Picker M.D.   On: 07/05/2022 19:29        Scheduled Meds:  Chlorhexidine Gluconate Cloth  6 each Topical Q0600   Chlorhexidine Gluconate Cloth  6 each Topical Q0600   heparin sodium (porcine)       midodrine  10 mg Oral TID WC   midodrine  5 mg Oral Q M,W,F-HD   naLOXone (NARCAN)  injection  0.4 mg Intravenous Once   naLOXone (NARCAN)  injection  0.4 mg  Intravenous Once   mouth rinse  15 mL Mouth Rinse 4 times per day   pantoprazole (PROTONIX) IV  40 mg Intravenous Q12H   pentafluoroprop-tetrafluoroeth       potassium chloride  40 mEq Oral Once   sodium chloride flush  10-40 mL Intracatheter Q12H   sodium chloride flush  3 mL Intravenous Q12H   sodium chloride flush  3 mL Intravenous Q12H   sodium chloride flush  3 mL Intravenous Q12H   Continuous Infusions:  sodium chloride     ampicillin-sulbactam (UNASYN) IV     anticoagulant sodium citrate     dextrose 20 mL/hr at 07/05/22 1751   naloxone HCl (NARCAN) 4 mg in dextrose 5 % 250 mL infusion     norepinephrine (  LEVOPHED) Adult infusion 8 mcg/min (07/06/22 0444)     LOS: 12 days      Sidney Ace, MD [facility]   If 7PM-7AM, please contact night-coverage  07/06/2022, 2:45 PM

## 2022-07-06 NOTE — Progress Notes (Signed)
Patient noted to have significant swelling of +3 pitting edema throughout the body, lethargic, and lung sounds with crackles bilaterally.  Dr. Priscella Mann updated and ordered venous blood gas and CT scan.  Patient is also consulted to intencivist who is also aware of patient current status, patient received 2 doses of narcan as ordered by NP, third dose ordered was not given as patient was alert and awake and NP ok to hold dose and narcan gtt at the time. Patient was transported with nurse present and on cardiac monitoring to CT scan, patient tolerated trip well and upon returning to room orders for patient to be NPO received.  Will continue to monitor patient.

## 2022-07-06 NOTE — Progress Notes (Signed)
Patient's CBG reading was 416, Dr. Priscella Mann notified and D10 turned off at this time.  Will continue to monitor.

## 2022-07-06 NOTE — Progress Notes (Signed)
PT Cancellation Note  Patient Details Name: Kathleen Sharp MRN: [id number] DOB: [birth year]   Cancelled Treatment:    Reason Eval/Treat Not Completed: Patient not medically ready PT orders received and pt chart reviewed. Per chart review, pt noted to have recent placement of temporary femoral venous catheter. Pt contraindicated for functional mobility at this time. Will hold and re-attempt at a later date/time as available and pt medically appropriate for PT services.   Herminio Commons, PT, DPT 8:20 AM,07/06/22 Physical Therapist - [facility]

## 2022-07-06 NOTE — Progress Notes (Signed)
OT Cancellation Note  Patient Details Name: Kathleen Sharp MRN: [id number] DOB: [birth year]   Cancelled Treatment:    Reason Eval/Treat Not Completed: Medical issues which prohibited therapy. OT continues to follow pt for initiation of therapy services. Pt noted to have recent placement of temporary femoral venous catheter. Pt contraindicated for functional mobility at this time. Will hold and re-attempt at a later date/time as available and pt medically appropriate for OT services.   Shara Blazing, M.S., OTR/L 07/06/22, 8:19 AM

## 2022-07-06 NOTE — Progress Notes (Addendum)
Dialysis tech at the bedside performing dialysis; dialysis tech accessed the patient's fistula to the left arm in leu of the trialysis catheter placed in the left femoral and initiated dialysis.  Intensivist notified and ok with dialysis continuing in the fistula site as it was functioning properly.

## 2022-07-06 NOTE — Progress Notes (Signed)
   07/06/22 2215  Vitals  Temp 97.7 F (36.5 C)  Temp Source Oral  BP (!) 120/105  MAP (mmHg) 109  BP Location Right Arm  BP Method Automatic  Patient Position (if appropriate) Lying  Pulse Rate 81  Pulse Rate Source Monitor  ECG Heart Rate 81  Resp (!) 22  Oxygen Therapy  SpO2 100 %  O2 Device Nasal Cannula  O2 Flow Rate (L/min) 3 L/min  Patient Activity (if Appropriate) In bed  Pulse Oximetry Type Continuous  During Treatment Monitoring  Blood Flow Rate (mL/min) 200 mL/min  HD Safety Checks Performed Yes  Intra-Hemodialysis Comments Tx completed  Post Treatment  Dialyzer Clearance Lightly streaked  Duration of HD Treatment -hour(s) 3 hour(s)  Hemodialysis Intake (mL) 0 mL  Liters Processed 54  Fluid Removed 2500 mL  Tolerated HD Treatment Yes  Post-Hemodialysis Comments hd completed. UF only. no complications.  AVG/AVF Arterial Site Held (minutes) 10 minutes  AVG/AVF Venous Site Held (minutes) 10 minutes  Fistula / Graft Left Upper arm  No placement date or time found.   Placed prior to admission: Yes  Orientation: Left  Access Location: Upper arm  Site Condition No complications  Fistula / Graft Assessment Present;Thrill;Bruit  Status Deaccessed  Needle Size 15  Drainage Description None

## 2022-07-06 NOTE — Plan of Care (Signed)
Continuing with plan of care. 

## 2022-07-06 NOTE — Progress Notes (Signed)
[facility]  PROGRESS NOTE   Subjective:   Events noted.  Patient has worsening mentation.  She was dialyzed last night. CT scan of the head was negative.  Objective:  Vital signs: Blood pressure (!) 142/116, pulse 88, temperature (!) 97.2 F (36.2 C), resp. rate 18, height $RemoveBe'5\' 2"'UXojqFhuo$  (1.575 m), weight 89.8 kg, SpO2 100 %.  Intake/Output Summary (Last 24 hours) at 07/06/2022 1221 Last data filed at 07/06/2022 0600 Gross per 24 hour  Intake 606.07 ml  Output 1500 ml  Net -893.93 ml   Filed Weights   07/05/22 1211 07/06/22 0300 07/06/22 0600  Weight: 91.3 kg 91.3 kg 89.8 kg     Physical Exam: General:  No acute distress  Head:  Normocephalic, atraumatic. Moist oral mucosal membranes  Eyes:  Anicteric  Neck:  Supple  Lungs:   Clear to auscultation, normal effort  Heart:  S1S2 no rubs  Abdomen:   Soft, nontender, bowel sounds present  Extremities:  peripheral edema.  Neurologic: Lethargic and poorly arousable.  Skin:  No lesions  Access:     Basic Metabolic Panel: Recent Labs  Lab 07/02/22 0445 07/03/22 0353 06/18/2022 0540 06/30/2022 1222 07/05/22 0356 07/05/22 2057 07/06/22 0614  NA 135 133* 133*  --  132* 130* 134*  K 3.6 4.1 3.5  --  4.4 4.2 3.2*  CL 98 98 95*  --  93* 95* 96*  CO2 $Re'29 27 27  'eOe$ --  $R'26 27 30  'Em$ GLUCOSE 100* 103* 144* 120* 125* 111* 115*  BUN 33* 96* 80*  --  86* 70* 32*  CREATININE 4.69* 6.08* 4.26*  --  5.55* 5.19* 2.66*  CALCIUM 8.3* 8.1* 7.8*  --  8.2* 8.0* 7.7*  MG 1.9 1.9 1.6*  --  2.1 1.9 1.6*  PHOS 3.5 2.7 2.9  --  4.3  --  2.2*    CBC: Recent Labs  Lab 07/02/22 0445 07/02/22 1603 07/03/22 0353 07/03/22 1153 07/03/22 1800 07/13/2022 0540 07/05/2022 1222 07/05/22 0005 07/05/22 0356 07/05/22 1222 07/05/22 2057 07/06/22 0614  WBC 11.0*  --  18.5* 19.5*  --  27.4*  --   --  22.8*  --   --  18.0*  NEUTROABS 8.0*  --  12.2*  --   --  20.7*  --   --  17.6*  --   --  14.5*  HGB 7.3*   < > 6.3* 7.1*   < > 7.1*   < > 7.9* 7.9*   8.0* 7.5* 8.1* 8.7*  HCT 23.9*   < > 19.9* 21.9*   < > 21.0*   < > 23.6* 23.5*  24.3* 22.5* 24.2* 26.6*  MCV 83.3  --  81.6 83.0  --  82.7  --   --  85.0  --   --  85.5  PLT 147*  --  141* 124*  --  125*  --   --  128*  --   --  148*   < > = values in this interval not displayed.     Urinalysis: No results for input(s): "COLORURINE", "LABSPEC", "PHURINE", "GLUCOSEU", "HGBUR", "BILIRUBINUR", "KETONESUR", "PROTEINUR", "UROBILINOGEN", "NITRITE", "LEUKOCYTESUR" in the last 72 hours.  Invalid input(s): "APPERANCEUR"    Imaging: CT CHEST WO CONTRAST  Result Date: 07/06/2022 CLINICAL DATA:  Chronic dyspnea of unclear etiology EXAM: CT CHEST WITHOUT CONTRAST TECHNIQUE: Multidetector CT imaging of the chest was performed following the standard protocol without IV contrast. RADIATION DOSE REDUCTION: This exam was performed according to the departmental dose-optimization  program which includes automated exposure control, adjustment of the mA and/or kV according to patient size and/or use of iterative reconstruction technique. COMPARISON:  Chest radiograph dated 07/05/2022 FINDINGS: Cardiovascular: Multichamber cardiomegaly. No significant pericardial fluid/thickening. Aortic atherosclerosis. Coronary artery calcifications. Aortic valvular calcifications. Anatomic variant common origin of the brachiocephalic and common carotid arteries. Mediastinum/Nodes: Partially imaged thyroid gland without nodules meeting criteria for imaging follow-up by size. Normal esophagus. No pathologically enlarged axillary, supraclavicular, mediastinal, or hilar lymph nodes. Lungs/Pleura: The central airways are patent. Crescentic appearance of the lower trachea. Minimal upper lobe predominant centrilobular emphysema. Bilateral upper lobe subpleural reticular opacities. No focal consolidation. 3 mm right upper lobe subpleural pulmonary nodule (3:36). No follow-up needed if patient is low-risk.This recommendation follows the  consensus statement: Guidelines for Management of Incidental Pulmonary Nodules Detected on CT Images: From the Fleischner Society 2017; Radiology 2017; 284:228-243. Right basilar relaxation atelectasis adjacent to the pleural effusion. Middle lobe, lingular, and left lower lobe subsegmental atelectasis. Mild interlobular septal thickening. No pneumothorax. Trace bilateral pleural effusions. Upper abdomen: Punctate calcified granuloma in hepatic segment 2. Cholelithiasis. No adrenal nodules. Bilateral atrophic kidneys. Bilateral cysts, including a peripherally calcified subcentimeter cyst in the right upper lobe. Small volume free fluid. Musculoskeletal: No acute or abnormal lytic or blastic osseous lesions. Diffuse body wall edema. IMPRESSION: 1. Multichamber cardiomegaly with trace bilateral pleural effusions and mild interlobular septal thickening suggestive of mild pulmonary edema. 2. Crescentic appearance of the lower trachea, which can be seen in the setting of tracheomalacia. 3. Bilateral upper lobe subpleural reticular opacities suggest a component of interstitial lung disease. 4. Cholelithiasis. 5. Coronary artery disease. Aortic Atherosclerosis (ICD10-I70.0) and Emphysema (ICD10-J43.9). Electronically Signed   By: Darrin Nipper M.D.   On: 07/06/2022 09:48   CT HEAD WO CONTRAST (5MM)  Result Date: 07/06/2022 CLINICAL DATA:  Mental status change with unknown cause EXAM: CT HEAD WITHOUT CONTRAST TECHNIQUE: Contiguous axial images were obtained from the base of the skull through the vertex without intravenous contrast. RADIATION DOSE REDUCTION: This exam was performed according to the departmental dose-optimization program which includes automated exposure control, adjustment of the mA and/or kV according to patient size and/or use of iterative reconstruction technique. COMPARISON:  07/03/2022 FINDINGS: Brain: No evidence of acute infarction, hemorrhage, hydrocephalus, extra-axial collection or mass  lesion/mass effect. Chronic small vessel ischemia in the cerebral white matter. Vascular: No hyperdense vessel or unexpected calcification. Skull: Normal. Negative for fracture or focal lesion. Sinuses/Orbits: Mucosal thickening in the left sphenoid sinus which is chronic and stable. Other: Motion degraded, subtle findings could be obscured. IMPRESSION: Motion degraded head CT without acute or interval finding. Electronically Signed   By: Jorje Guild M.D.   On: 07/06/2022 09:42   DG Chest 1 View  Result Date: 07/05/2022 CLINICAL DATA:  Shortness of breath EXAM: CHEST  1 VIEW COMPARISON:  07/05/2022, 07/03/2022, 08/14/2020 FINDINGS: Cardiomegaly with vascular congestion and pulmonary edema. Aortic atherosclerosis. Right upper extremity central venous catheter tip at the distal SVC. No pneumothorax IMPRESSION: Cardiomegaly with similar vascular congestion and pulmonary edema. Possible left pleural effusion Electronically Signed   By: Donavan Foil M.D.   On: 07/05/2022 21:11   DG Chest Port 1 View  Result Date: 07/05/2022 CLINICAL DATA:  Acute respiratory failure EXAM: PORTABLE CHEST 1 VIEW COMPARISON:  07/03/2022 FINDINGS: Transverse diameter of heart is increased. Central pulmonary vessels are prominent. There is increase in interstitial markings in parahilar regions Increased density in left lower lung field may be related to cardiomegaly or possibly  underlying atelectasis/pneumonia. There is blunting of left lateral CP angle suggesting small effusion. There is no pneumothorax. Tip of PICC line is seen in the region of superior vena cava close to the right atrium. IMPRESSION: Cardiomegaly. Prominence of central pulmonary vessels and increased interstitial markings in parahilar regions suggest CHF. Increased density in left lower lung fields may be due to pleural effusion and possibly underlying infiltrate. Electronically Signed   By: Elmer Picker M.D.   On: 07/05/2022 19:29     Medications:     sodium chloride     ampicillin-sulbactam (UNASYN) IV     anticoagulant sodium citrate     dextrose 20 mL/hr at 07/05/22 1751   naloxone HCl (NARCAN) 4 mg in dextrose 5 % 250 mL infusion     norepinephrine (LEVOPHED) Adult infusion 8 mcg/min (07/06/22 0444)    Chlorhexidine Gluconate Cloth  6 each Topical Q0600   Chlorhexidine Gluconate Cloth  6 each Topical Q0600   heparin sodium (porcine)       midodrine  10 mg Oral TID WC   midodrine  5 mg Oral Q M,W,F-HD   naLOXone (NARCAN)  injection  0.4 mg Intravenous Once   naLOXone (NARCAN)  injection  0.4 mg Intravenous Once   mouth rinse  15 mL Mouth Rinse 4 times per day   pantoprazole (PROTONIX) IV  40 mg Intravenous Q12H   pentafluoroprop-tetrafluoroeth       potassium chloride  40 mEq Oral Once   sodium chloride flush  10-40 mL Intracatheter Q12H   sodium chloride flush  3 mL Intravenous Q12H   sodium chloride flush  3 mL Intravenous Q12H   sodium chloride flush  3 mL Intravenous Q12H    Assessment/ Plan:     Principal Problem:   NSTEMI (non-ST elevated myocardial infarction) (HCC) Active Problems:   Anemia of chronic renal failure, stage 5 (HCC)   End stage renal disease (HCC)   Essential hypertension   Volume overload   Hyperkalemia   Pulmonary edema   Cardiac arrhythmia   Syncope and collapse   Metabolic encephalopathy   Anemia, posthemorrhagic, acute   Pseudoaneurysm following procedure [facility])  76 year old female with history of hypertension, coronary artery disease, congestive heart failure, hyperlipidemia, end-stage renal disease on dialysis on a Monday Wednesday Friday schedule admitted to the hospital with history of shortness of breath.  Hospital course was complicated by blood loss anemia.  Patient is now hypotensive acute mental status changes.  CT scan of the head is negative.  #1: End-stage renal disease: Patient was dialyzed last night with 1.5 L fluid removal.  Since patient seems to be fluid overloaded we  will attempt ultrafiltration with 2.5 to 3 L of fluid removal as tolerated.  Patient has left AV fistula.  #2: Blood loss anemia: Patient received blood transfusions.  We will continue to monitor closely.  #3: Mental status changes: CT head negative.  Patient received Dilaudid.  Patient is now given Narcan.  #4: Respiratory failure/questionable aspiration pneumonia: Agreed to start her on IV Unasyn.  #5: Hypotension: Agree to continue the pressors.  Prognosis is guarded.  Spoke to the family at bedside in detail.   LOS: [place], [facility] 10/21/202312:21 PM

## 2022-07-06 NOTE — Progress Notes (Signed)
SUBJECTIVE:  Patient is a 76 year old female with a history of ESRD on dialysis MWF, hypertension, cardiomegaly, hyperlipidemia, who presented to the ED on 06/28/2022 with shortness of breath, palpitations. Denied chest pain at admission. Troponin level peaked at 14,207.   On 10/10, patient had an episode of nonsustained ventricular tachycardia followed by atrial fibrillation associated with dizziness. Patient denies losing consciousness. IV amiodoarone started.    Cardiac cath rescheduled for 10/12, however, patient developed mental status changes, cath was cancelled.    On 10/15 overnight patient became unresponsive, bradycardic, hypotensive. IV amiodarone discontinued. Patient transferred to ICU.    Cardiac cath 07/07/2022 revealed minor luminal irregularities about 30% in LAD circumflex and RCA with no significant coronary artery disease.  LVEF on echo was 55% thus LV gram was deferred   Acute anemia- unclear etiology, Hgb now 6.3. Patient received one unit PRBC. Patient having CT abdomen pelvis this morning.    CT abdomen pelvis on 07/03/22 revealed possible pseudoaneurysm. Ultrasound confirmed a partially thrombosed pseudoaneurysm measuring approximately 1.9 x 0.4 x 1.3 cm.    06/29/2022 Patient underwent right lower extremity angiogram with stent placement to pseudoaneurysm.   Vitals:   07/06/22 0700 07/06/22 0730 07/06/22 0800 07/06/22 1200  BP: 97/74 (!) 89/45 (!) 142/116 (!) 87/63  Pulse: 79 73 88 96  Resp: $Remo'20 14 18 'SMlNd$ (!) 26  Temp:      TempSrc:      SpO2: 99% 100% 100% 100%  Weight:      Height:        Intake/Output Summary (Last 24 hours) at 07/06/2022 1404 Last data filed at 07/06/2022 0600 Gross per 24 hour  Intake 606.07 ml  Output 1500 ml  Net -893.93 ml    LABS: Basic Metabolic Panel: Recent Labs    07/05/22 2057 07/06/22 0614 07/06/22 1138  NA 130* 134* 134*  K 4.2 3.2* 3.8  CL 95* 96* 97*  CO2 $Re'27 30 30  'KQZ$ GLUCOSE 111* 115* 111*  BUN 70* 32* 37*  CREATININE  5.19* 2.66* 3.58*  CALCIUM 8.0* 7.7* 7.8*  MG 1.9 1.6*  --   PHOS  --  2.2* 3.4   Liver Function Tests: Recent Labs    07/06/22 1138  ALBUMIN 2.5*   No results for input(s): "LIPASE", "AMYLASE" in the last 72 hours. CBC: Recent Labs    07/05/22 0356 07/05/22 1222 07/06/22 0614 07/06/22 1138  WBC 22.8*  --  18.0* 14.4*  NEUTROABS 17.6*  --  14.5*  --   HGB 7.9*  8.0*   < > 8.7* 7.8*  HCT 23.5*  24.3*   < > 26.6* 23.8*  MCV 85.0  --  85.5 85.6  PLT 128*  --  148* 127*   < > = values in this interval not displayed.   Cardiac Enzymes: No results for input(s): "CKTOTAL", "CKMB", "CKMBINDEX", "TROPONINI" in the last 72 hours. BNP: Invalid input(s): "POCBNP" D-Dimer: No results for input(s): "DDIMER" in the last 72 hours. Hemoglobin A1C: No results for input(s): "HGBA1C" in the last 72 hours. Fasting Lipid Panel: No results for input(s): "CHOL", "HDL", "LDLCALC", "TRIG", "CHOLHDL", "LDLDIRECT" in the last 72 hours. Thyroid Function Tests: No results for input(s): "TSH", "T4TOTAL", "T3FREE", "THYROIDAB" in the last 72 hours.  Invalid input(s): "FREET3" Anemia Panel: No results for input(s): "VITAMINB12", "FOLATE", "FERRITIN", "TIBC", "IRON", "RETICCTPCT" in the last 72 hours.   PHYSICAL EXAM General: Well developed, well nourished, in no acute distress HEENT:  Normocephalic and atramatic Neck:  No JVD.  Lungs: Clear bilaterally to auscultation and percussion. Heart: HRRR . Normal S1 and S2 without gallops or murmurs.  Abdomen: Bowel sounds are positive, abdomen soft and non-tender  Msk:  Back normal, normal gait. Normal strength and tone for age. Extremities: No clubbing, cyanosis or edema.   Neuro: Alert and oriented X 3. Psych:  Good affect, responds appropriately  TELEMETRY: sinus rhythm, HR 81 bpm  ASSESSMENT AND PLAN: Patient currently resting in bed. Denies chest pain. Shortness of breath improved. Remains on pressors. Will continue to follow.  Principal  Problem:   NSTEMI (non-ST elevated myocardial infarction) ([place]) Active Problems:   Anemia of chronic renal failure, stage 5 (HCC)   End stage renal disease (HCC)   Essential hypertension   Volume overload   Hyperkalemia   Pulmonary edema   Cardiac arrhythmia   Syncope and collapse   Metabolic encephalopathy   Anemia, posthemorrhagic, acute   Pseudoaneurysm following procedure ([place])    Jazmen Lindenbaum, FNP-C 07/06/2022 2:04 PM

## 2022-07-06 NOTE — Consult Note (Signed)
Pharmacy Antibiotic Note  Kathleen Sharp is a 76 y.o. female admitted on 07/15/2022 with NSTEMI complicated by ABLA  and right groin pseudoaneurysm with stent placement. PMH significant for ESRD on HD MWF, HTN, cardiomegaly, HLD. New concern for aspiration pneumonia Pharmacy has been consulted for Unasyn dosing.  Plan: Day 1 of antibiotics Initiate Unasyn 3 g IV Q24H Continue to monitor renal function and follow culture results  Height: $Remove'5\' 2"'mLNpzMJ$  (157.5 cm) Weight: 89.8 kg (197 lb 15.6 oz) IBW/kg (Calculated) : 50.1  Temp (24hrs), Avg:98.1 F (36.7 C), Min:97.2 F (36.2 C), Max:98.9 F (37.2 C)  Recent Labs  Lab 07/03/22 0353 07/03/22 1153 07/03/22 1155 07/03/22 1530 06/21/2022 0540 07/05/22 0356 07/05/22 1645 07/05/22 2057 07/06/22 0614  WBC 18.5* 19.5*  --   --  27.4* 22.8*  --   --  18.0*  CREATININE 6.08*  --   --   --  4.26* 5.55*  --  5.19* 2.66*  LATICACIDVEN  --   --  1.3 1.3  --   --  1.3 1.0  --     Estimated Creatinine Clearance: 19.3 mL/min (A) (by C-G formula based on SCr of 2.66 mg/dL (H)).    No Known Allergies  Antimicrobials this admission: 10/8 Cefepime and Vancomycin x1 10/21 Unasyn >>   Microbiology results: 10/9 MRSA PCR: negative  Thank you for allowing pharmacy to be a part of this patient's care.  Gretel Acre, PharmD PGY1 Pharmacy Resident 07/06/2022 11:18 AM

## 2022-07-06 NOTE — Progress Notes (Signed)
NAME:  Kathleen Sharp, MRN:  [id number], DOB:  Nov 02, 1945, LOS: 12 ADMISSION DATE:  06/18/2022, CONSULTATION DATE:  06/30/22 REFERRING MD:  Sharion Settler  REASON FOR CONSULT:  Hypotension    HPI  76 y.o  female with significant PMH of HTN, HLD, Cardiomegaly, ESRD on HD MWF who presented to the ED with chief complaints of SOB   ED Course: Initial vital signs showed HR of 95 beats/minute, BP mm Hg, the RR 29 breaths/minute, and the oxygen saturation 96 % on  4L and a temperature of 97.67F.   Pertinent Labs/Diagnostics Findings: Chemistry:Na+/ K+: 135/5.9 Glucose:172  BUN/Cr.:63/9.33 CBC: WBC:5.9  Hgb:11.8 Plts: 269 Other Lab findings: Lactic acid: 4.1 COVID PCR: Negative, Troponin: 89  Imaging:  CXR>1. Moderate pulmonary edema. 2. Moderate left and small right pleural effusions. 3. Cardiomegaly.uconate, vancomycin and cefepime Medications Administered: Diltiazem 10 mg IV one-time dose for SVT, Lokelma p.o., insulin aspart 10 mg, D50, calcium glI  Patient was admitted to hospitalist service for further management. See significant events for hospital course  Past Medical History  HTN, HLD, Cardiomegaly, ESRD on HD MWF  Significant Hospital Events   10/8: Admitted to PCU with Acute respiratory failure in the setting of pulmonary edema. Patient went into SVT requiring Diltiazem IV push  and Hyperkalemia correction. She was also started on Heparin gtt for NSTEMI. 10/9:Cardiology consulted who recommended cath but patient declined. Started on Plavix pending Echo. Nephrology consulted s/p urgent HD for volume removal. Patient had a syncopal episode. EF normal on Echo. 10/10:Started on IV amiodarone gtt for nonsustained ventricular tachycardia followed by atrial fibrillation associated with dizziness spell.CT Chest/CTA negative for CVA 10/11:Post HD 202ml fluid removed 10/12: Rapid response called for altered mental status and hypoglycemia, VBG showed hypercapnia, patient placed on BiPAP.  Patient initially declined cath but later consented however due to change in mental status cardiac cath was cancelled. 10/13:Remained on Amiodarone gtt.  10/14:Pt completed 2.15 hours of 3.5 hour HD treatment. 700 ml removed patient became hypotensive but completed session. 10/15: Rapid response called for unresponsiveness, bradycardia and hypotension. Noted with cool extremities. Albumin administered, Amiodarone stopped and Atropine 0.5 mg administered x 2. Patient transferred to  the ICU. PCCM consulted. On arrival, art line placed which showed BP readings in the 244'W systolic with NUU>72. Pressors held. Remains on BiPAP  10/18: PCCM reconsulted pt developed acute respiratory failure secondary to pulmonary edema currently requiring 2L O2 via nasal canula.  Pt pending hemodialysis session  10/19: Overnight pt had episodes of hypoglycemia currently requiring D10W $RemoveBefore'@40'bzewoPFkZbwiJ$  ml/hr. Plans for vascular surgery to perform intervention of right femoral pseudoaneurysm. Pt requiring levophed gtt and prn blood transfusions due to acute blood loss anemia.  Pt with continued acute respiratory failure 10/20 remains on pressors 10/21 trialysis catheter placed emergently and received HD overnight NCHCT negative for bleed Remains very sleepy   Consults:  Nephrology Cardiology  Procedures:  Cardiac Catheterization 10/16:  Prox RCA lesion is 30% stenosed. Dist Cx lesion is 30% stenosed.Dist LAD lesion is 30% stenosed. The left ventricular systolic function is normal. The left ventricular ejection fraction is 50-55% by visual estimate.  Significant Diagnostic Tests:  06/25/22: CT head: No evidence of acute intracranial abnormality. Moderate chronic small ischemic disease within the cerebral white matter. Small chronic lacunar infarct within the left caudate head. Mild generalized cerebral atrophy. Left sphenoid sinusitis.   06/25/22: CTA neck: The common carotid and internal carotid arteries are patent within the  neck without hemodynamically significant stenosis. Atherosclerotic  plaque, bilaterally. Vertebral arteries patent within the neck. Mild atherosclerotic narrowing at the origin of the dominant right vertebral artery. Aortic Atherosclerosis (ICD10-I70.0) and Emphysema (ICD10-J43.9).   06/25/22: CTA head: No intracranial large vessel occlusion is identified. Intracranial atherosclerotic disease with multifocal stenoses, most notably as follows. Severe stenosis within the left vertebral artery proximal V4 segment. Mild-to-moderate stenosis within the paraclinoid right ICA.  06/25/22: CXR: Slightly improved aeration of bilateral lungs with persistent interstitial pulmonary opacities, most consistent with pulmonary edema. Small bilateral pleural effusions.Cardiomegaly.  10/18: CTA GI Bleed: No CT evidence of active gastrointestinal bleed. Focal outpouching of the anterior right common femoral artery with adjacent soft tissue thickening/possible small hematoma. This could represent a pseudoaneurysm.Recommend targeted ultrasound. No other acute findings in the abdomen or pelvis. Trace pleural effusions.  Cardiomegaly  10/18: [place] ART RLE: Access site pseudoaneurysm measuring up to 1.9 cm just anterior to the right common femoral artery.  10/19 S/P Covered stent placement to right common femoral artery with 8 mm diameter by 5 cm length Viabahn stent to exclude the pseudoaneurysm   Micro Data:  10/8: SARS-CoV-2 PCR> negative 10/8: Influenza PCR> negative 10/9: MRSA PCR>>negative  Antimicrobials:  None  OBJECTIVE  Blood pressure 103/73, pulse 76, temperature (!) 97.2 F (36.2 C), resp. rate 19, height $RemoveBe'5\' 2"'rDDgqXEnu$  (1.575 m), weight 89.8 kg, SpO2 99 %.    FiO2 (%):  [40 %] 40 %   Intake/Output Summary (Last 24 hours) at 07/06/2022 0729 Last data filed at 07/06/2022 0600 Gross per 24 hour  Intake 606.07 ml  Output 1500 ml  Net -893.93 ml   Filed Weights   07/05/22 1211 07/06/22 0300 07/06/22 0600   Weight: 91.3 kg 91.3 kg 89.8 kg    Physical Examination  GENERAL: Acute on chronically-ill appearing female  LUNGS: Diffuse rhonchi throughout, mild tachypnea  CARDIOVASCULAR: no murmurs s1, s2 reg rate ABDOMEN: +BS x4, obese, soft, non tender, non distended   EXTREMITIES: LUE fistula +bruit/thrill   NEUROLOGIC:  Confused but was able to briefly communicate with niece SKIN: No obvious rash, lesion, or ulcer.      Review of Systems: Gen:  Denies  fever, sweats, chills weight loss  HEENT: Denies blurred vision, double vision, ear pain, eye pain, hearing loss, nose bleeds, sore throat Cardiac:  No dizziness, chest pain or heaviness, chest tightness,edema, Other:  All other systems negative  Labs/imaging that I havepersonally reviewed  (right click and "Reselect all SmartList Selections" daily)     Labs   CBC: Recent Labs  Lab 07/02/22 0445 07/02/22 1603 07/03/22 0353 07/03/22 1153 07/03/22 1800 06/25/2022 0540 07/16/2022 1222 07/05/22 0005 07/05/22 0356 07/05/22 1222 07/05/22 2057 07/06/22 0614  WBC 11.0*  --  18.5* 19.5*  --  27.4*  --   --  22.8*  --   --  18.0*  NEUTROABS 8.0*  --  12.2*  --   --  20.7*  --   --  17.6*  --   --  14.5*  HGB 7.3*   < > 6.3* 7.1*   < > 7.1*   < > 7.9* 7.9*  8.0* 7.5* 8.1* 8.7*  HCT 23.9*   < > 19.9* 21.9*   < > 21.0*   < > 23.6* 23.5*  24.3* 22.5* 24.2* 26.6*  MCV 83.3  --  81.6 83.0  --  82.7  --   --  85.0  --   --  85.5  PLT 147*  --  141* 124*  --  125*  --   --  128*  --   --  148*   < > = values in this interval not displayed.    Basic Metabolic Panel: Recent Labs  Lab 07/02/22 0445 07/03/22 0353 07/10/2022 0540 06/25/2022 1222 07/05/22 0356 07/05/22 2057 07/06/22 0614  NA 135 133* 133*  --  132* 130* 134*  K 3.6 4.1 3.5  --  4.4 4.2 3.2*  CL 98 98 95*  --  93* 95* 96*  CO2 $Re'29 27 27  'kXk$ --  $R'26 27 30  'pC$ GLUCOSE 100* 103* 144* 120* 125* 111* 115*  BUN 33* 96* 80*  --  86* 70* 32*  CREATININE 4.69* 6.08* 4.26*  --  5.55* 5.19*  2.66*  CALCIUM 8.3* 8.1* 7.8*  --  8.2* 8.0* 7.7*  MG 1.9 1.9 1.6*  --  2.1 1.9 1.6*  PHOS 3.5 2.7 2.9  --  4.3  --  2.2*   GFR: Estimated Creatinine Clearance: 19.3 mL/min (A) (by C-G formula based on SCr of 2.66 mg/dL (H)). Recent Labs  Lab 07/03/22 1153 07/03/22 1155 07/03/22 1530 07/03/2022 0540 07/05/22 0356 07/05/22 1645 07/05/22 2057 07/06/22 0614  WBC 19.5*  --   --  27.4* 22.8*  --   --  18.0*  LATICACIDVEN  --  1.3 1.3  --   --  1.3 1.0  --     Liver Function Tests: Recent Labs  Lab 06/30/22 0555  AST 29  ALT 124*  ALKPHOS 52  BILITOT 0.4  PROT 6.4*  ALBUMIN 3.7   No results for input(s): "LIPASE", "AMYLASE" in the last 168 hours. No results for input(s): "AMMONIA" in the last 168 hours.  ABG    Component Value Date/Time   PHART 7.35 06/30/2022 0730   PCO2ART 52 (H) 06/30/2022 0730   PO2ART 196 (H) 06/30/2022 0730   HCO3 29.8 (H) 07/05/2022 2315   O2SAT 75.7 07/05/2022 2315     Coagulation Profile: Recent Labs  Lab 07/03/22 1530  INR 2.2*     Cardiac Enzymes: No results for input(s): "CKTOTAL", "CKMB", "CKMBINDEX", "TROPONINI" in the last 168 hours.  HbA1C: No results found for: "HGBA1C"  CBG: Recent Labs  Lab 07/05/22 1410 07/05/22 1644 07/05/22 2013 07/05/22 2307 07/06/22 0357  GLUCAP 117* 111* 126* 116* 118*  Home Medications  Prior to Admission medications   Medication Sig Start Date End Date Taking? Authorizing Provider  carvedilol (COREG) 6.25 MG tablet Take 6.25 mg by mouth 2 (two) times daily with a meal.   Yes [provider]  clopidogrel (PLAVIX) 75 MG tablet Take 75 mg by mouth daily.   Yes [provider]  lidocaine-prilocaine (EMLA) cream Apply 1 Application topically daily as needed (dialysis treatment).   Yes [provider]  lisinopril (ZESTRIL) 40 MG tablet Take 40 mg by mouth daily.   Yes [provider]  loratadine (CLARITIN) 10 MG tablet Take 10 mg by mouth daily.   Yes [provider]  Multiple Vitamins-Minerals (MULTIVITAMIN WITH MINERALS) tablet Take 1 tablet by mouth daily.   Yes [provider]  sevelamer carbonate (RENVELA) 800 MG tablet Take 1,600 mg by mouth 3 (three) times daily with meals.   Yes [provider]    Scheduled Meds:  Chlorhexidine Gluconate Cloth  6 each Topical Q0600   heparin sodium (porcine)       midodrine  10 mg Oral TID WC   midodrine  5 mg Oral Q M,W,F-HD   mouth rinse  15 mL Mouth Rinse 4 times per  day   pantoprazole (PROTONIX) IV  40 mg Intravenous Q12H   sodium chloride flush  10-40 mL Intracatheter Q12H   sodium chloride flush  3 mL Intravenous Q12H   sodium chloride flush  3 mL Intravenous Q12H   sodium chloride flush  3 mL Intravenous Q12H   Continuous Infusions:  sodium chloride     dextrose 20 mL/hr at 07/05/22 1751   norepinephrine (LEVOPHED) Adult infusion 8 mcg/min (07/06/22 0444)   PRN Meds:.sodium chloride, acetaminophen, fentaNYL (SUBLIMAZE) injection, guaiFENesin, haloperidol, heparin sodium (porcine), hydrALAZINE, HYDROmorphone (DILAUDID) injection, ipratropium-albuterol, metoprolol tartrate, ondansetron (ZOFRAN) IV, ondansetron (ZOFRAN) IV, mouth rinse, oxyCODONE, senna-docusate, sodium chloride flush, sodium chloride flush   Active Hospital Problem list       Assessment & Plan:  Acute hypoxic hypercapnic respiratory failure secondary to pulmonary edema-slowly improving - Supplemental O2 as needed to maintain O2 saturations 88 to 92% - No evidence of hypoxia - Less likely to use BPAP/NIV     NSTEMI Hypotension secondary to acute blood loss anemia  PMHx: CAD, HLD, HTN   Cardiac Cath 10/16: minor luminal irregularities about 30% in LAD circumflex and RCA with no significant coronary artery disease.  EF 50-55% - Continuous cardiac monitoring - Vasopressors as needed to maintain MAP >55 - Cardiology following, appreciate input   ESRD on HD MWF - continue Foley Catheter-assess  need - Avoid nephrotoxic agents - Follow urine output, BMP - Ensure adequate renal perfusion, optimize oxygenation - Renal dose medications - HD overnight via trialysis catheter; may need HD again later today given encephalopathy and fluid overload?   Intake/Output Summary (Last 24 hours) at 07/06/2022 0729 Last data filed at 07/06/2022 0600 Gross per 24 hour  Intake 606.07 ml  Output 1500 ml  Net -893.93 ml      Latest Ref Rng & Units 07/06/2022    6:14 AM 07/05/2022    8:57 PM 07/05/2022    3:56 AM  BMP  Glucose 70 - 99 mg/dL 115  111  125   BUN 8 - 23 mg/dL 32  70  86   Creatinine 0.44 - 1.00 mg/dL 2.66  5.19  5.55   Sodium 135 - 145 mmol/L 134  130  132   Potassium 3.5 - 5.1 mmol/L 3.2  4.2  4.4   Chloride 98 - 111 mmol/L 96  95  93   CO2 22 - 32 mmol/L $RemoveB'30  27  26   'obYRKQbX$ Calcium 8.9 - 10.3 mg/dL 7.7  8.0  8.2      Hypomagnesia  - Trend BMP  - Replace electrolytes as indicated   ABLA Acute blood loss anemia secondary to right common femoral artery pseudoaneurysm  Thrombocytopenia   - Trend CBC - Monitor for s/sx of bleeding and transfuse for hgb <7 - Vascular surgery consulted pt to undergo vascular repair of pseudoaneurysm   Acute Metabolic Encephalopathy due to above Ongoing   CT Head 10/20: negative for acute intracranial abnormality - Provide supportive care - received dilaudid - improving responsiveness with narcan doses - consider narcan gtt if becomes unresponsive now  Hypoglycemic Episodes - CBGs - Follow ICU hyper/hypoglycemia protocol  Best practice:  Diet:  NPO Pain/Anxiety/Delirium protocol (if indicated): No VAP protocol (if indicated): Not indicated DVT prophylaxis: SCD's GI prophylaxis: N/A Glucose control: N/A Central venous access: Right upper arm PICC line  Arterial line: N/A Foley: N/A Mobility:  bed rest  PT consulted: N/A Last date of multidisciplinary goals of care discussion [06/28/2022] Code Status: DNR Disposition: ICU  Will ask Palliative Care team to see pt again due to continued decline in pts condition to discuss goals of care      DVT/GI PRX  assessed I Assessed the need for Labs I Assessed the need for Foley I Assessed the need for Central Venous Line Family Discussion when available I Assessed the need for Mobilization I made an Assessment of medications to be adjusted accordingly Safety Risk assessment completed  CASE DISCUSSED IN MULTIDISCIPLINARY ROUNDS WITH ICU TEAM     Critical Care Time devoted to patient care services described in this note is 35 minutes.  Critical care was necessary to treat /prevent imminent and life-threatening deterioration.  Arcelia Jew MD PCCM

## 2022-07-06 NOTE — Progress Notes (Signed)
   07/06/22 0600  Vitals  Temp (!) 97.2 F (36.2 C)  BP 103/73  MAP (mmHg) 102  Pulse Rate 76  ECG Heart Rate 80  Resp 19  Post Treatment  Dialyzer Clearance Lightly streaked  Duration of HD Treatment -hour(s) 3 hour(s)  Liters Processed 52.2  Fluid Removed 1500 mL  Tolerated HD Treatment Yes   TX fin w/o difficulty.

## 2022-07-06 NOTE — Procedures (Signed)
Central Venous Catheter Insertion Procedure Note  Kathleen Sharp  [id number]  18-May-1946  Date:07/06/22  Time:7:03 AM   Provider Performing:Thadd Apuzzo A [name]   Procedure: Insertion of Non-tunneled Central Venous Catheter(36556) with US guidance (16606)   Indication(s) Medication administration, Difficult access, and Hemodialysis  Consent Unable to obtain consent due to emergent nature of procedure.  Anesthesia Topical only with 1% lidocaine   Timeout Verified patient identification, verified procedure, site/side was marked, verified correct patient position, special equipment/implants available, medications/allergies/relevant history reviewed, required imaging and test results available.  Sterile Technique Maximal sterile technique including full sterile barrier drape, hand hygiene, sterile gown, sterile gloves, mask, hair covering, sterile ultrasound probe cover (if used).  Procedure Description Area of catheter insertion was cleaned with chlorhexidine and draped in sterile fashion.  With real-time ultrasound guidance a central venous catheter was placed into the left femoral vein. Nonpulsatile blood flow and easy flushing noted in all ports.  The catheter was sutured in place and sterile dressing applied.  Complications/Tolerance None; patient tolerated the procedure well. Chest X-ray is ordered to verify placement for internal jugular or subclavian cannulation.   Chest x-ray is not ordered for femoral cannulation.  EBL Minimal  Specimen(s) None  [name], DNP, CCRN, FNP-C, AGACNP-BC Acute Care & Family Nurse Practitioner  [facility]  See Amion for personal pager PCCM on call pager [phone] until 7 am

## 2022-07-07 ENCOUNTER — Inpatient Hospital Stay: Payer: Medicare HMO

## 2022-07-07 DIAGNOSIS — I214 Non-ST elevation (NSTEMI) myocardial infarction: Secondary | ICD-10-CM | POA: Diagnosis not present

## 2022-07-07 LAB — CBC WITH DIFFERENTIAL/PLATELET
Abs Immature Granulocytes: 0.12 10*3/uL — ABNORMAL HIGH (ref 0.00–0.07)
Basophils Absolute: 0 10*3/uL (ref 0.0–0.1)
Basophils Relative: 0 %
Eosinophils Absolute: 0.1 10*3/uL (ref 0.0–0.5)
Eosinophils Relative: 1 %
HCT: 25.4 % — ABNORMAL LOW (ref 36.0–46.0)
Hemoglobin: 8.1 g/dL — ABNORMAL LOW (ref 12.0–15.0)
Immature Granulocytes: 1 %
Lymphocytes Relative: 5 %
Lymphs Abs: 0.8 10*3/uL (ref 0.7–4.0)
MCH: 28.2 pg (ref 26.0–34.0)
MCHC: 31.9 g/dL (ref 30.0–36.0)
MCV: 88.5 fL (ref 80.0–100.0)
Monocytes Absolute: 2.6 10*3/uL — ABNORMAL HIGH (ref 0.1–1.0)
Monocytes Relative: 16 %
Neutro Abs: 12 10*3/uL — ABNORMAL HIGH (ref 1.7–7.7)
Neutrophils Relative %: 77 %
Platelets: 172 10*3/uL (ref 150–400)
RBC: 2.87 MIL/uL — ABNORMAL LOW (ref 3.87–5.11)
RDW: 16.8 % — ABNORMAL HIGH (ref 11.5–15.5)
WBC: 15.7 10*3/uL — ABNORMAL HIGH (ref 4.0–10.5)
nRBC: 0.1 % (ref 0.0–0.2)

## 2022-07-07 LAB — BASIC METABOLIC PANEL
Anion gap: 8 (ref 5–15)
BUN: 42 mg/dL — ABNORMAL HIGH (ref 8–23)
CO2: 29 mmol/L (ref 22–32)
Calcium: 7.9 mg/dL — ABNORMAL LOW (ref 8.9–10.3)
Chloride: 99 mmol/L (ref 98–111)
Creatinine, Ser: 4.64 mg/dL — ABNORMAL HIGH (ref 0.44–1.00)
GFR, Estimated: 9 mL/min — ABNORMAL LOW (ref >60)
Glucose, Bld: 110 mg/dL — ABNORMAL HIGH (ref 70–99)
Potassium: 4 mmol/L (ref 3.5–5.1)
Sodium: 136 mmol/L (ref 135–145)

## 2022-07-07 LAB — GLUCOSE, CAPILLARY
Glucose-Capillary: 110 mg/dL — ABNORMAL HIGH (ref 70–99)
Glucose-Capillary: 112 mg/dL — ABNORMAL HIGH (ref 70–99)
Glucose-Capillary: 93 mg/dL (ref 70–99)
Glucose-Capillary: 93 mg/dL (ref 70–99)
Glucose-Capillary: 96 mg/dL (ref 70–99)

## 2022-07-07 LAB — HEMOGLOBIN AND HEMATOCRIT, BLOOD
HCT: 25.3 % — ABNORMAL LOW (ref 36.0–46.0)
HCT: 25.4 % — ABNORMAL LOW (ref 36.0–46.0)
Hemoglobin: 8.1 g/dL — ABNORMAL LOW (ref 12.0–15.0)
Hemoglobin: 8.2 g/dL — ABNORMAL LOW (ref 12.0–15.0)

## 2022-07-07 LAB — PHOSPHORUS: Phosphorus: 5.2 mg/dL — ABNORMAL HIGH (ref 2.5–4.6)

## 2022-07-07 LAB — MAGNESIUM: Magnesium: 1.7 mg/dL (ref 1.7–2.4)

## 2022-07-07 MED ORDER — LORAZEPAM 2 MG/ML IJ SOLN: 1.0000 mg | INTRAMUSCULAR | Status: DC | PRN

## 2022-07-07 MED ORDER — GLYCOPYRROLATE 0.2 MG/ML IJ SOLN
0.2000 mg | INTRAMUSCULAR | Status: DC | PRN
  Administered 2022-07-07 – 2022-07-08 (×2): 0.2 mg via INTRAVENOUS
  Filled 2022-07-07 (×2): qty 1

## 2022-07-07 MED ORDER — MORPHINE 100MG IN NS 100ML (1MG/ML) PREMIX INFUSION
1.0000 mg/h | INTRAVENOUS | Status: DC
  Administered 2022-07-07: 1 mg/h via INTRAVENOUS
  Filled 2022-07-07: qty 100

## 2022-07-07 MED ORDER — HYDROMORPHONE HCL 1 MG/ML IJ SOLN
INTRAMUSCULAR | Status: AC
  Filled 2022-07-07: qty 1

## 2022-07-07 MED ORDER — GLYCOPYRROLATE 0.2 MG/ML IJ SOLN: 0.2000 mg | INTRAMUSCULAR | Status: DC | PRN

## 2022-07-07 MED ORDER — FUROSEMIDE 10 MG/ML IJ SOLN: 40.0000 mg | Freq: Once | INTRAMUSCULAR | Status: DC

## 2022-07-07 MED ORDER — IPRATROPIUM-ALBUTEROL 0.5-2.5 (3) MG/3ML IN SOLN: 3.0000 mL | Freq: Four times a day (QID) | RESPIRATORY_TRACT | Status: DC | PRN

## 2022-07-07 MED ORDER — IPRATROPIUM-ALBUTEROL 0.5-2.5 (3) MG/3ML IN SOLN: 3.0000 mL | Freq: Four times a day (QID) | RESPIRATORY_TRACT | Status: DC

## 2022-07-07 MED ORDER — MORPHINE BOLUS VIA INFUSION: 1.0000 mg | INTRAVENOUS | Status: DC | PRN

## 2022-07-07 MED ORDER — DIPHENHYDRAMINE HCL 50 MG/ML IJ SOLN: 12.5000 mg | INTRAMUSCULAR | Status: DC | PRN

## 2022-07-07 MED ORDER — LORAZEPAM 1 MG PO TABS: 1.0000 mg | ORAL_TABLET | ORAL | Status: DC | PRN

## 2022-07-07 MED ORDER — CHLORHEXIDINE GLUCONATE CLOTH 2 % EX PADS: 6.0000 | MEDICATED_PAD | Freq: Every day | CUTANEOUS | Status: DC

## 2022-07-07 MED ORDER — GLYCOPYRROLATE 1 MG PO TABS: 1.0000 mg | ORAL_TABLET | ORAL | Status: DC | PRN

## 2022-07-07 MED ORDER — FUROSEMIDE 10 MG/ML IJ SOLN
INTRAMUSCULAR | Status: AC
  Administered 2022-07-07: 40 mg
  Filled 2022-07-07: qty 4

## 2022-07-07 MED ORDER — LORAZEPAM 2 MG/ML PO CONC: 1.0000 mg | ORAL | Status: DC | PRN

## 2022-07-07 MED ORDER — FUROSEMIDE 10 MG/ML IJ SOLN
INTRAMUSCULAR | Status: AC
  Filled 2022-07-07: qty 12

## 2022-07-07 MED ORDER — BIOTENE DRY MOUTH MT LIQD: 15.0000 mL | OROMUCOSAL | Status: DC | PRN

## 2022-07-07 NOTE — Progress Notes (Signed)
Patient's blood pressure readings are misleading in accuracy due to patient's edema, spoke with Dr. Priscella Mann regarding the matter as the patient is on Levophed, patient placed on CVP monitoring for a more accurate indication of cardiac perfusion, CVP level was 19, Dr. Gwendalyn Ege is also consulted and notified of findings as well.  Dr. Gwendalyn Ege instructed to titrate Levophed based off of mental status and CVP readings.

## 2022-07-07 NOTE — Progress Notes (Signed)
  Interdisciplinary Goals of Care Family Meeting   Date carried out: 07/07/2022  Location of the meeting: Bedside  Member's involved: NP, 4 Family Members, Niece [name], Brother, Hood, and Sister In [employer] or acting medical decision maker: Niece [name]   Discussion: We discussed goals of care for [employer] .  We discussed her hospital stay thus far and her change in respiratory status this evening. Attending physician had already spoke with niece [name] and recommended comfort care. I explained what inpatient comfort care looks like. Questions elicited and answered and family decided to move forward with full comfort measures.   Code status: DNR  Disposition: In-patient comfort care  Time spent for the meeting: 25 mins    Jone Baseman, NP  07/07/2022, 8:22 PM

## 2022-07-07 NOTE — Progress Notes (Signed)
[facility]  PROGRESS NOTE   Subjective:   Patient is awake and alert.  Feels very hungry. Yesterday she was lethargic and poorly responsive.  She was given Narcan. She was also started on IV antibiotics for possible fear of aspiration pneumonia. She had dialysis yesterday with fluid removal and her breathing has improved significantly.  Objective:  Vital signs: Blood pressure (!) 82/59, pulse 78, temperature 98 F (36.7 C), temperature source Oral, resp. rate (!) 22, height $RemoveBe'5\' 2"'WsMkjriKY$  (1.575 m), weight 90 kg, SpO2 99 %.  Intake/Output Summary (Last 24 hours) at 07/07/2022 1005 Last data filed at 07/06/2022 2215 Gross per 24 hour  Intake 698.47 ml  Output 2500 ml  Net -1801.53 ml   Filed Weights   07/06/22 0600 07/06/22 1800 07/06/22 2232  Weight: 89.8 kg 92.5 kg 90 kg     Physical Exam: General:  No acute distress  Head:  Normocephalic, atraumatic. Moist oral mucosal membranes  Eyes:  Anicteric  Neck:  Supple  Lungs:   Clear to auscultation, normal effort  Heart:  S1S2 no rubs  Abdomen:   Soft, nontender, bowel sounds present  Extremities: 1+ peripheral edema.  Neurologic:  Awake, alert, following commands  Skin:  No lesions  Access:     Basic Metabolic Panel: Recent Labs  Lab 07/01/2022 0540 07/16/2022 1222 07/05/22 0356 07/05/22 2057 07/06/22 0614 07/06/22 1138 07/07/22 0500  NA 133*  --  132* 130* 134* 134* 136  K 3.5  --  4.4 4.2 3.2* 3.8 4.0  CL 95*  --  93* 95* 96* 97* 99  CO2 27  --  $R'26 27 30 30 29  'IG$ GLUCOSE 144*   < > 125* 111* 115* 111* 110*  BUN 80*  --  86* 70* 32* 37* 42*  CREATININE 4.26*  --  5.55* 5.19* 2.66* 3.58* 4.64*  CALCIUM 7.8*  --  8.2* 8.0* 7.7* 7.8* 7.9*  MG 1.6*  --  2.1 1.9 1.6*  --  1.7  PHOS 2.9  --  4.3  --  2.2* 3.4 5.2*   < > = values in this interval not displayed.    CBC: Recent Labs  Lab 07/03/22 0353 07/03/22 1153 06/30/2022 0540 07/07/2022 1222 07/05/22 0356 07/05/22 1222 07/06/22 0614 07/06/22 1138  07/06/22 1739 07/07/22 0010 07/07/22 0500  WBC 18.5*   < > 27.4*  --  22.8*  --  18.0* 14.4*  --   --  15.7*  NEUTROABS 12.2*  --  20.7*  --  17.6*  --  14.5*  --   --   --  12.0*  HGB 6.3*   < > 7.1*   < > 7.9*  8.0*   < > 8.7* 7.8* 7.8* 8.1* 8.1*  HCT 19.9*   < > 21.0*   < > 23.5*  24.3*   < > 26.6* 23.8* 23.7* 25.3* 25.4*  MCV 81.6   < > 82.7  --  85.0  --  85.5 85.6  --   --  88.5  PLT 141*   < > 125*  --  128*  --  148* 127*  --   --  172   < > = values in this interval not displayed.     Urinalysis: No results for input(s): "COLORURINE", "LABSPEC", "PHURINE", "GLUCOSEU", "HGBUR", "BILIRUBINUR", "KETONESUR", "PROTEINUR", "UROBILINOGEN", "NITRITE", "LEUKOCYTESUR" in the last 72 hours.  Invalid input(s): "APPERANCEUR"    Imaging: US Venous Img Upper Uni Left (DVT)  Result Date: 07/07/2022 CLINICAL DATA:  Left  upper extremity pain and swelling. History of arteriovenous fistula. EXAM: LEFT UPPER EXTREMITY VENOUS DOPPLER ULTRASOUND TECHNIQUE: Gray-scale sonography with graded compression, as well as color Doppler and duplex ultrasound were performed to evaluate the upper extremity deep venous system from the level of the subclavian vein and including the jugular, axillary, basilic, radial, ulnar and upper cephalic vein. Spectral Doppler was utilized to evaluate flow at rest and with distal augmentation maneuvers. COMPARISON:  None Available. FINDINGS: Contralateral Subclavian Vein: Respiratory phasicity is normal and symmetric with the symptomatic side. No evidence of thrombus. Normal compressibility. Internal Jugular Vein: No evidence of thrombus. Normal compressibility, respiratory phasicity and response to augmentation. Subclavian Vein: No evidence of thrombus. Normal compressibility, respiratory phasicity and response to augmentation. Axillary Vein: No evidence of thrombus. Normal compressibility, respiratory phasicity and response to augmentation. Cephalic Vein: No evidence of  thrombus. Normal compressibility, respiratory phasicity and response to augmentation. Basilic Vein: Unable to visualize. Brachial Veins: No evidence of thrombus. Normal compressibility, respiratory phasicity and response to augmentation. Radial Veins: No evidence of thrombus. Normal compressibility, respiratory phasicity and response to augmentation. Ulnar Veins: No evidence of thrombus. Normal compressibility, respiratory phasicity and response to augmentation. Venous Reflux:  None visualized. Other Findings: Arteriovenous graft is patent. Superficial subcutaneous edema is noted. IMPRESSION: No evidence of DVT within the left upper extremity. Patent arteriovenous graft. Electronically Signed   By: Jacqulynn Cadet M.D.   On: 07/07/2022 07:09   CT CHEST WO CONTRAST  Result Date: 07/06/2022 CLINICAL DATA:  Chronic dyspnea of unclear etiology EXAM: CT CHEST WITHOUT CONTRAST TECHNIQUE: Multidetector CT imaging of the chest was performed following the standard protocol without IV contrast. RADIATION DOSE REDUCTION: This exam was performed according to the departmental dose-optimization program which includes automated exposure control, adjustment of the mA and/or kV according to patient size and/or use of iterative reconstruction technique. COMPARISON:  Chest radiograph dated 07/05/2022 FINDINGS: Cardiovascular: Multichamber cardiomegaly. No significant pericardial fluid/thickening. Aortic atherosclerosis. Coronary artery calcifications. Aortic valvular calcifications. Anatomic variant common origin of the brachiocephalic and common carotid arteries. Mediastinum/Nodes: Partially imaged thyroid gland without nodules meeting criteria for imaging follow-up by size. Normal esophagus. No pathologically enlarged axillary, supraclavicular, mediastinal, or hilar lymph nodes. Lungs/Pleura: The central airways are patent. Crescentic appearance of the lower trachea. Minimal upper lobe predominant centrilobular emphysema.  Bilateral upper lobe subpleural reticular opacities. No focal consolidation. 3 mm right upper lobe subpleural pulmonary nodule (3:36). No follow-up needed if patient is low-risk.This recommendation follows the consensus statement: Guidelines for Management of Incidental Pulmonary Nodules Detected on CT Images: From the Fleischner Society 2017; Radiology 2017; 284:228-243. Right basilar relaxation atelectasis adjacent to the pleural effusion. Middle lobe, lingular, and left lower lobe subsegmental atelectasis. Mild interlobular septal thickening. No pneumothorax. Trace bilateral pleural effusions. Upper abdomen: Punctate calcified granuloma in hepatic segment 2. Cholelithiasis. No adrenal nodules. Bilateral atrophic kidneys. Bilateral cysts, including a peripherally calcified subcentimeter cyst in the right upper lobe. Small volume free fluid. Musculoskeletal: No acute or abnormal lytic or blastic osseous lesions. Diffuse body wall edema. IMPRESSION: 1. Multichamber cardiomegaly with trace bilateral pleural effusions and mild interlobular septal thickening suggestive of mild pulmonary edema. 2. Crescentic appearance of the lower trachea, which can be seen in the setting of tracheomalacia. 3. Bilateral upper lobe subpleural reticular opacities suggest a component of interstitial lung disease. 4. Cholelithiasis. 5. Coronary artery disease. Aortic Atherosclerosis (ICD10-I70.0) and Emphysema (ICD10-J43.9). Electronically Signed   By: Darrin Nipper M.D.   On: 07/06/2022 09:48   CT HEAD WO  CONTRAST (5MM)  Result Date: 07/06/2022 CLINICAL DATA:  Mental status change with unknown cause EXAM: CT HEAD WITHOUT CONTRAST TECHNIQUE: Contiguous axial images were obtained from the base of the skull through the vertex without intravenous contrast. RADIATION DOSE REDUCTION: This exam was performed according to the departmental dose-optimization program which includes automated exposure control, adjustment of the mA and/or kV  according to patient size and/or use of iterative reconstruction technique. COMPARISON:  07/03/2022 FINDINGS: Brain: No evidence of acute infarction, hemorrhage, hydrocephalus, extra-axial collection or mass lesion/mass effect. Chronic small vessel ischemia in the cerebral white matter. Vascular: No hyperdense vessel or unexpected calcification. Skull: Normal. Negative for fracture or focal lesion. Sinuses/Orbits: Mucosal thickening in the left sphenoid sinus which is chronic and stable. Other: Motion degraded, subtle findings could be obscured. IMPRESSION: Motion degraded head CT without acute or interval finding. Electronically Signed   By: Jorje Guild M.D.   On: 07/06/2022 09:42   DG Chest 1 View  Result Date: 07/05/2022 CLINICAL DATA:  Shortness of breath EXAM: CHEST  1 VIEW COMPARISON:  07/05/2022, 07/03/2022, 08/14/2020 FINDINGS: Cardiomegaly with vascular congestion and pulmonary edema. Aortic atherosclerosis. Right upper extremity central venous catheter tip at the distal SVC. No pneumothorax IMPRESSION: Cardiomegaly with similar vascular congestion and pulmonary edema. Possible left pleural effusion Electronically Signed   By: Donavan Foil M.D.   On: 07/05/2022 21:11   DG Chest Port 1 View  Result Date: 07/05/2022 CLINICAL DATA:  Acute respiratory failure EXAM: PORTABLE CHEST 1 VIEW COMPARISON:  07/03/2022 FINDINGS: Transverse diameter of heart is increased. Central pulmonary vessels are prominent. There is increase in interstitial markings in parahilar regions Increased density in left lower lung field may be related to cardiomegaly or possibly underlying atelectasis/pneumonia. There is blunting of left lateral CP angle suggesting small effusion. There is no pneumothorax. Tip of PICC line is seen in the region of superior vena cava close to the right atrium. IMPRESSION: Cardiomegaly. Prominence of central pulmonary vessels and increased interstitial markings in parahilar regions suggest CHF.  Increased density in left lower lung fields may be due to pleural effusion and possibly underlying infiltrate. Electronically Signed   By: Elmer Picker M.D.   On: 07/05/2022 19:29     Medications:    sodium chloride     ampicillin-sulbactam (UNASYN) IV Stopped (07/06/22 1706)   anticoagulant sodium citrate     dextrose Stopped (07/06/22 1745)   naloxone HCl (NARCAN) 4 mg in dextrose 5 % 250 mL infusion     norepinephrine (LEVOPHED) Adult infusion 10 mcg/min (07/06/22 2120)    Chlorhexidine Gluconate Cloth  6 each Topical Q0600   Chlorhexidine Gluconate Cloth  6 each Topical Q0600   midodrine  10 mg Oral TID WC   midodrine  5 mg Oral Q M,W,F-HD   naLOXone (NARCAN)  injection  0.4 mg Intravenous Once   naLOXone (NARCAN)  injection  0.4 mg Intravenous Once   mouth rinse  15 mL Mouth Rinse 4 times per day   pantoprazole (PROTONIX) IV  40 mg Intravenous Q12H   potassium chloride  40 mEq Oral Once   sodium chloride flush  10-40 mL Intracatheter Q12H   sodium chloride flush  3 mL Intravenous Q12H   sodium chloride flush  3 mL Intravenous Q12H   sodium chloride flush  3 mL Intravenous Q12H    Assessment/ Plan:     Principal Problem:   NSTEMI (non-ST elevated myocardial infarction) ([place]) Active Problems:   Anemia of chronic renal failure, stage  5 ([place])   End stage renal disease ([place])   Essential hypertension   Volume overload   Hyperkalemia   Pulmonary edema   Cardiac arrhythmia   Syncope and collapse   Metabolic encephalopathy   Anemia, posthemorrhagic, acute   Pseudoaneurysm following procedure [facility])  76 year old female with history of hypertension, coronary artery disease, congestive heart failure, hyperlipidemia, end-stage renal disease on dialysis on a Monday Wednesday Friday schedule admitted to the hospital with history of shortness of breath.  Hospital course was complicated by blood loss anemia.  Patient is now hypotensive acute mental status changes.  CT scan of  the head is negative.   #1: End-stage renal disease: Patient was dialyzed last night with 2.5 L fluid removal.  Patient has left AV fistula.  We will reevaluate for dialysis in a.m.   #2: Blood loss anemia: Patient received blood transfusions.  We will continue to monitor closely.   #3: Mental status changes: CT head negative.  Patient received Dilaudid.  Patient was given Narcan with improvement in mental status.   #4: Respiratory failure/questionable aspiration pneumonia: Antibiotics were discontinued.   #5: Hypotension: Agree to continue the pressors to maintain the MAP around 80-90.   Prognosis is guarded.  Spoke to the family at bedside in detail.   LOS: [place], [facility] 10/22/202310:05 AM

## 2022-07-07 NOTE — Progress Notes (Signed)
Before noon patient was changed to a dysphagia 2 diet with thin liquids, patient attempted to swallow 75mL of thin liquid and immediately started coughing. Immediately advised patient we would need speech to see her for an evaluation before further fluids or foods, patient also desated to 69% requiring 15L of O2 [place] and high flow oxygen which only brought her oxygen saturation to the high 80s and low 90s, patient was also in tripod position.  Dr. Priscella Mann also notified and RT, Dr. Priscella Mann ordered for patient to be placed on heated high flow oxygen and CXR.  RT at bed side and changed patient from current oxygen support to heated high flow. Dr. Priscella Mann ordered for Dilaudid to be administered for respiratory relief, patient resonded well to the medication. At approximately 1715 patient's CVP reading was anywhere from 19-25 and patient noted to have increased work of breathing, audible friction rub noted from upper to mid chest.  Dr. Gwendalyn Ege at bedside and ordered stat CXR and for nephrology to be reached out to for emergency dialysis or possible CRRT.  Dr. Priscella Mann also at bedside and reaching out to family as patient's condition had significantly declined from previous. Nephrology updated care team that a nurse would come in to remove fluid and Dr. Priscella Mann contacted family who is on the way in.  Will continue to monitor patient.

## 2022-07-07 NOTE — Evaluation (Signed)
Clinical/Bedside Swallow Evaluation Patient Details  Name: Kathleen Sharp MRN: [id number] Date of Birth: [birth year]  Today's Date: 07/07/2022 Time: SLP Start Time (ACUTE ONLY): 1430 SLP Stop Time (ACUTE ONLY): 1455 SLP Time Calculation (min) (ACUTE ONLY): 25 min  Past Medical History:  Past Medical History:  Diagnosis Date   Anemia    Arthritis    Chronic kidney disease    Chronic Kidney Disease   Hypercholesterolemia    Hypertension    Past Surgical History:  Past Surgical History:  Procedure Laterality Date   ABDOMINAL HYSTERECTOMY     AV FISTULA PLACEMENT Left 12/15/2015   Procedure: INSERTION OF ARTERIOVENOUS (AV) GORE-TEX GRAFT ARM             ( BRACHIAL AXILLARY GRAFT );  Surgeon: [name], MD;  Location: [facility] ORS;  Service: Vascular;  Laterality: Left;   CAPD INSERTION N/A 11/24/2015   Procedure: Exploratory laparoscopy;  Surgeon: [name], MD;  Location: [facility] ORS;  Service: Vascular;  Laterality: N/A;, attempted insertion but unable due to scar tissue   LEFT HEART CATH AND CORONARY ANGIOGRAPHY N/A 07/07/2022   Procedure: LEFT HEART CATH AND CORONARY ANGIOGRAPHY;  Surgeon: Dionisio David, MD;  Location: [place] [facility];  Service: Cardiovascular;  Laterality: N/A;   LOWER EXTREMITY ANGIOGRAPHY Right 06/26/2022   Procedure: Lower Extremity Angiography;  Surgeon: Algernon Huxley, MD;  Location: [place] [facility];  Service: Cardiovascular;  Laterality: Right;   PERIPHERAL VASCULAR CATHETERIZATION Left 02/27/2016   Procedure: Thrombectomy;  Surgeon: [name], MD;  Location: [place] [facility];  Service: Cardiovascular;  Laterality: Left;   PERIPHERAL VASCULAR CATHETERIZATION Left 04/12/2016   Procedure: Thrombectomy;  Surgeon: [name], MD;  Location: [place] [facility];  Service: Cardiovascular;  Laterality: Left;   PERIPHERAL VASCULAR CATHETERIZATION Left 05/22/2016   Procedure: Thrombectomy;  Surgeon: [name], MD;  Location:  [place] [facility];  Service: Cardiovascular;  Laterality: Left;   PERIPHERAL VASCULAR CATHETERIZATION N/A 05/22/2016   Procedure: A/V Shuntogram/Fistulagram;  Surgeon: [name], MD;  Location: [place] [facility];  Service: Cardiovascular;  Laterality: N/A;   PERIPHERAL VASCULAR CATHETERIZATION Left 06/21/2016   Procedure: Thrombectomy;  Surgeon: [name], MD;  Location: [place] [facility];  Service: Cardiovascular;  Laterality: Left;   HPI:  Per progress note: "76 year old with history of ESRD on HD MWF, HTN, cardiomegaly, HLD presented to the ED with shortness of breath.  COVID/influenza were negative, troponins of 89, lactic acidosis.  Overnight had developed SVT and troponin peaked greater than 14,000.  Patient was initially started on heparin drip and later refused cardiac catheterization.  Hospital course was also complicated by syncopal episode.  CTA of the chest and CT of head and neck were negative for acute pathology.  Echocardiogram showed normal EF.  Due to arrhythmia amiodarone was planned by cardiology team.  Eventually agreed to cardiac cath which showed nonobstructive CAD.  Hospital course complicated by acute blood loss anemia and right groin pseudoaneurysm for which vascular was consulted and stent was placed by their service.  Off-and-on she is also had low blood pressure requiring pressors." Update from today includes. "Patient's mental status did improve this morning.  She was upset she was unable to eat.  We agreed to allow her to attempt small amount of dysphagia diet.  Immediately after drinking a small amount of apple juice the patient began coughing and had subsequent deterioration in respiratory status and increased" oxygen  demand.  CXR 07/07/22: "Progressive airspace disease at both lower lobes. This may represent atelectasis or infection. Increasing mild edema.Currently on heated high flow nasal cannula." Pt currently NPO with 55 LMN O2 High Flow Nasal Canula.  Pt's niece was in room for duration of evaluation.    Assessment / Plan / Recommendation  Clinical Impression  Pt seen for limited bedside swallow assessment given caution following aspiration event earlier this date, increased oxygen requirements, and level of lethargy. Audible secretions/congestion noted in voicing and throat clear prior to initial PO trial. Pt with fluctuating alertness during oral care and required tactile and verbal cues for sustained alertness for select number of ice chips. O2 saturations maintained at greater than 98% for duration of trials. Education shared with pt's niece regarding rationale for caution with PO intake and pt's high aspiration risk. Written education and demonstration provided for administration of ice chips, with niece reporting understanding.   Recommend continued NPO with limited ice chips (following oral care and only when pt upright, awake, and requesting). MD and RN in agreement with plan. Written recommendations left in the room. Of note, RN voicing concern for need for alternative means of nutrition, recommend further conversation with palliative involvement as indicated. SLP will follow up to determine readiness and safety with PO intake. SLP Visit Diagnosis: Dysphagia, oropharyngeal phase (R13.12)    Aspiration Risk  Severe aspiration risk    Diet Recommendation   NPO  Medication Administration: Via alternative means    Other  Recommendations Oral Care Recommendations: Oral care prior to ice chip/H20;Staff/trained caregiver to provide oral care    Recommendations for follow up therapy are one component of a multi-disciplinary discharge planning process, led by the attending physician.  Recommendations may be updated based on patient status, additional functional criteria and insurance authorization.  Follow up Recommendations Other (comment) (TBD)      Assistance Recommended at Discharge Frequent or constant Supervision/Assistance   Functional Status Assessment Patient has had a recent decline in their functional status and/or demonstrates limited ability to make significant improvements in function in a reasonable and predictable amount of time  Frequency and Duration min 2x/week  1 week        Swallow Study   General Date of Onset: 07/07/22 HPI: Per progress note: "76 year old with history of ESRD on HD MWF, HTN, cardiomegaly, HLD presented to the ED with shortness of breath.  COVID/influenza were negative, troponins of 89, lactic acidosis.  Overnight had developed SVT and troponin peaked greater than 14,000.  Patient was initially started on heparin drip and later refused cardiac catheterization.  Hospital course was also complicated by syncopal episode.  CTA of the chest and CT of head and neck were negative for acute pathology.  Echocardiogram showed normal EF.  Due to arrhythmia amiodarone was planned by cardiology team.  Eventually agreed to cardiac cath which showed nonobstructive CAD.  Hospital course complicated by acute blood loss anemia and right groin pseudoaneurysm for which vascular was consulted and stent was placed by their service.  Off-and-on she is also had low blood pressure requiring pressors." Update from today includes. "Patient's mental status did improve this morning.  She was upset she was unable to eat.  We agreed to allow her to attempt small amount of dysphagia diet.  Immediately after drinking a small amount of apple juice the patient began coughing and had subsequent deterioration in respiratory status and increased oxygen demand.  CXR 07/07/22: "Progressive airspace disease at both lower lobes. This may  represent atelectasis or infection. Increasing mild edema.Currently on heated high flow nasal cannula." Pt currently NPO with 55 LMN O2 High Flow Nasal Canula. Pt's niece was in room for duration of evaluation. Type of Study: Bedside Swallow Evaluation Previous Swallow Assessment: none in chart Diet  Prior to this Study: NPO Temperature Spikes Noted: No (temp- 98; WBC 15.7) Respiratory Status: Nasal cannula (high flow nasal canula) History of Recent Intubation: No Behavior/Cognition: Lethargic/Drowsy;Distractible Oral Cavity Assessment: Within Functional Limits Oral Care Completed by SLP: Yes Oral Cavity - Dentition: Adequate natural dentition Self-Feeding Abilities: Total assist Patient Positioning: Upright in bed Baseline Vocal Quality: Wet Volitional Cough: Cognitively unable to elicit Volitional Swallow: Unable to elicit    Oral/Motor/Sensory Function Overall Oral Motor/Sensory Function: Within functional limits   Ice Chips Ice chips: Within functional limits Presentation: Spoon   Thin Liquid Thin Liquid: Not tested    Nectar Thick Nectar Thick Liquid: Not tested   Honey Thick Honey Thick Liquid: Not tested   Puree Puree: Not tested   Solid   Kathleen Milas Schappell Clapp MS CCC-SLP Solid: Not tested      Kathleen Sharp 07/07/2022,4:44 PM

## 2022-07-07 NOTE — Progress Notes (Signed)
NAME:  Kathleen Sharp, MRN:  [id number], DOB:  1946-06-22, LOS: 13 ADMISSION DATE:  07/02/2022, CONSULTATION DATE:  06/30/22 REFERRING MD:  Manuela Schwartz  REASON FOR CONSULT:  Hypotension    HPI  76 y.o  female with significant PMH of HTN, HLD, Cardiomegaly, ESRD on HD MWF who presented to the ED with chief complaints of SOB   ED Course: Initial vital signs showed HR of 95 beats/minute, BP mm Hg, the RR 29 breaths/minute, and the oxygen saturation 96 % on  4L and a temperature of 97.75F.   Pertinent Labs/Diagnostics Findings: Chemistry:Na+/ K+: 135/5.9 Glucose:172  BUN/Cr.:63/9.33 CBC: WBC:5.9  Hgb:11.8 Plts: 269 Other Lab findings: Lactic acid: 4.1 COVID PCR: Negative, Troponin: 89  Imaging:  CXR>1. Moderate pulmonary edema. 2. Moderate left and small right pleural effusions. 3. Cardiomegaly.uconate, vancomycin and cefepime Medications Administered: Diltiazem 10 mg IV one-time dose for SVT, Lokelma p.o., insulin aspart 10 mg, D50, calcium glI  Patient was admitted to hospitalist service for further management. See significant events for hospital course  Past Medical History  HTN, HLD, Cardiomegaly, ESRD on HD MWF  Significant Hospital Events   10/8: Admitted to PCU with Acute respiratory failure in the setting of pulmonary edema. Patient went into SVT requiring Diltiazem IV push  and Hyperkalemia correction. She was also started on Heparin gtt for NSTEMI. 10/9:Cardiology consulted who recommended cath but patient declined. Started on Plavix pending Echo. Nephrology consulted s/p urgent HD for volume removal. Patient had a syncopal episode. EF normal on Echo. 10/10:Started on IV amiodarone gtt for nonsustained ventricular tachycardia followed by atrial fibrillation associated with dizziness spell.CT Chest/CTA negative for CVA 10/11:Post HD fluid removed 10/12: Rapid response called for altered mental status and hypoglycemia, VBG showed hypercapnia, patient placed on BiPAP.  Patient initially declined cath but later consented however due to change in mental status cardiac cath was cancelled. 10/13:Remained on Amiodarone gtt.  10/14:Pt completed 2.15 hours of 3.5 hour HD treatment. 700 ml removed patient became hypotensive but completed session. 10/15: Rapid response called for unresponsiveness, bradycardia and hypotension. Noted with cool extremities. Albumin administered, Amiodarone stopped and Atropine 0.5 mg administered x 2. Patient transferred to  the ICU. PCCM consulted. On arrival, art line placed which showed BP readings in the 140's systolic with MAP>99. Pressors held. Remains on BiPAP  10/18: PCCM reconsulted pt developed acute respiratory failure secondary to pulmonary edema currently requiring 2L O2 via nasal canula.  Pt pending hemodialysis session  10/19: Overnight pt had episodes of hypoglycemia currently requiring D10W @40  ml/hr. Plans for vascular surgery to perform intervention of right femoral pseudoaneurysm. Pt requiring levophed gtt and prn blood transfusions due to acute blood loss anemia.  Pt with continued acute respiratory failure 10/20 remains on pressors 10/21 trialysis catheter placed emergently and received HD overnight NCHCT negative for bleed Remains very sleepy   Consults:  Nephrology Cardiology  Procedures:  Cardiac Catheterization 10/16:  Prox RCA lesion is 30% stenosed. Dist Cx lesion is 30% stenosed.Dist LAD lesion is 30% stenosed. The left ventricular systolic function is normal. The left ventricular ejection fraction is 50-55% by visual estimate.  Significant Diagnostic Tests:  06/25/22: CT head: No evidence of acute intracranial abnormality. Moderate chronic small ischemic disease within the cerebral white matter. Small chronic lacunar infarct within the left caudate head. Mild generalized cerebral atrophy. Left sphenoid sinusitis.   06/25/22: CTA neck: The common carotid and internal carotid arteries are patent within the  neck without hemodynamically significant stenosis. Atherosclerotic  plaque, bilaterally. Vertebral arteries patent within the neck. Mild atherosclerotic narrowing at the origin of the dominant right vertebral artery. Aortic Atherosclerosis (ICD10-I70.0) and Emphysema (ICD10-J43.9).   06/25/22: CTA head: No intracranial large vessel occlusion is identified. Intracranial atherosclerotic disease with multifocal stenoses, most notably as follows. Severe stenosis within the left vertebral artery proximal V4 segment. Mild-to-moderate stenosis within the paraclinoid right ICA.  06/25/22: CXR: Slightly improved aeration of bilateral lungs with persistent interstitial pulmonary opacities, most consistent with pulmonary edema. Small bilateral pleural effusions.Cardiomegaly.  10/18: CTA GI Bleed: No CT evidence of active gastrointestinal bleed. Focal outpouching of the anterior right common femoral artery with adjacent soft tissue thickening/possible small hematoma. This could represent a pseudoaneurysm.Recommend targeted ultrasound. No other acute findings in the abdomen or pelvis. Trace pleural effusions.  Cardiomegaly  10/18: [place] ART RLE: Access site pseudoaneurysm measuring up to 1.9 cm just anterior to the right common femoral artery.  10/19 S/P Covered stent placement to right common femoral artery with 8 mm diameter by 5 cm length Viabahn stent to exclude the pseudoaneurysm   Micro Data:  10/8: SARS-CoV-2 PCR> negative 10/8: Influenza PCR> negative 10/9: MRSA PCR>>negative  Antimicrobials:  None  OBJECTIVE  Blood pressure (!) 82/59, pulse 78, temperature 98 F (36.7 C), temperature source Oral, resp. rate (!) 22, height $RemoveBe'5\' 2"'tAzayHoXo$  (1.575 m), weight 90 kg, SpO2 99 %. CVP:  [15 mmHg] 15 mmHg      Intake/Output Summary (Last 24 hours) at 07/07/2022 0940 Last data filed at 07/06/2022 2215 Gross per 24 hour  Intake 698.47 ml  Output 2500 ml  Net -1801.53 ml   Filed Weights   07/06/22 0600 07/06/22  1800 07/06/22 2232  Weight: 89.8 kg 92.5 kg 90 kg    Physical Examination  GENERAL: Acute on chronically-ill appearing female  LUNGS: Diffuse rhonchi throughout, mild tachypnea  CARDIOVASCULAR: no murmurs s1, s2 reg rate ABDOMEN: +BS x4, obese, soft, non tender, non distended   EXTREMITIES: LUE fistula +bruit/thrill   NEUROLOGIC:  Confused but was able to briefly communicate with niece SKIN: No obvious rash, lesion, or ulcer.      Review of Systems: Gen:  Denies  fever, sweats, chills weight loss  HEENT: Denies blurred vision, double vision, ear pain, eye pain, hearing loss, nose bleeds, sore throat Cardiac:  No dizziness, chest pain or heaviness, chest tightness,edema, Other:  All other systems negative  Labs/imaging that I havepersonally reviewed  (right click and "Reselect all SmartList Selections" daily)     Labs   CBC: Recent Labs  Lab 07/03/22 0353 07/03/22 1153 06/29/2022 0540 07/11/2022 1222 07/05/22 0356 07/05/22 1222 07/06/22 0614 07/06/22 1138 07/06/22 1739 07/07/22 0010 07/07/22 0500  WBC 18.5*   < > 27.4*  --  22.8*  --  18.0* 14.4*  --   --  15.7*  NEUTROABS 12.2*  --  20.7*  --  17.6*  --  14.5*  --   --   --  12.0*  HGB 6.3*   < > 7.1*   < > 7.9*  8.0*   < > 8.7* 7.8* 7.8* 8.1* 8.1*  HCT 19.9*   < > 21.0*   < > 23.5*  24.3*   < > 26.6* 23.8* 23.7* 25.3* 25.4*  MCV 81.6   < > 82.7  --  85.0  --  85.5 85.6  --   --  88.5  PLT 141*   < > 125*  --  128*  --  148* 127*  --   --  172   < > = values in this interval not displayed.    Basic Metabolic Panel: Recent Labs  Lab 06/21/2022 0540 07/07/2022 1222 07/05/22 0356 07/05/22 2057 07/06/22 0614 07/06/22 1138 07/07/22 0500  NA 133*  --  132* 130* 134* 134* 136  K 3.5  --  4.4 4.2 3.2* 3.8 4.0  CL 95*  --  93* 95* 96* 97* 99  CO2 27  --  $R'26 27 30 30 29  'GY$ GLUCOSE 144*   < > 125* 111* 115* 111* 110*  BUN 80*  --  86* 70* 32* 37* 42*  CREATININE 4.26*  --  5.55* 5.19* 2.66* 3.58* 4.64*  CALCIUM 7.8*  --   8.2* 8.0* 7.7* 7.8* 7.9*  MG 1.6*  --  2.1 1.9 1.6*  --  1.7  PHOS 2.9  --  4.3  --  2.2* 3.4 5.2*   < > = values in this interval not displayed.   GFR: Estimated Creatinine Clearance: 11.1 mL/min (A) (by C-G formula based on SCr of 4.64 mg/dL (H)). Recent Labs  Lab 07/03/22 1155 07/03/22 1530 07/14/2022 0540 07/05/22 0356 07/05/22 1645 07/05/22 2057 07/06/22 0614 07/06/22 1138 07/07/22 0500  WBC  --   --    < > 22.8*  --   --  18.0* 14.4* 15.7*  LATICACIDVEN 1.3 1.3  --   --  1.3 1.0  --   --   --    < > = values in this interval not displayed.    Liver Function Tests: Recent Labs  Lab 07/06/22 1138  ALBUMIN 2.5*   No results for input(s): "LIPASE", "AMYLASE" in the last 168 hours. No results for input(s): "AMMONIA" in the last 168 hours.  ABG    Component Value Date/Time   PHART 7.35 06/30/2022 0730   PCO2ART 52 (H) 06/30/2022 0730   PO2ART 196 (H) 06/30/2022 0730   HCO3 30.4 (H) 07/06/2022 0836   O2SAT 88.5 07/06/2022 0836     Coagulation Profile: Recent Labs  Lab 07/03/22 1530  INR 2.2*     Cardiac Enzymes: No results for input(s): "CKTOTAL", "CKMB", "CKMBINDEX", "TROPONINI" in the last 168 hours.  HbA1C: No results found for: "HGBA1C"  CBG: Recent Labs  Lab 07/06/22 1743 07/06/22 1745 07/07/22 0005 07/07/22 0607 07/07/22 0919  GLUCAP 489* 418* 112* 110* 96  Home Medications  Prior to Admission medications   Medication Sig Start Date End Date Taking? Authorizing Provider  carvedilol (COREG) 6.25 MG tablet Take 6.25 mg by mouth 2 (two) times daily with a meal.   Yes [provider]  clopidogrel (PLAVIX) 75 MG tablet Take 75 mg by mouth daily.   Yes [provider]  lidocaine-prilocaine (EMLA) cream Apply 1 Application topically daily as needed (dialysis treatment).   Yes [provider]  lisinopril (ZESTRIL) 40 MG tablet Take 40 mg by mouth daily.   Yes [provider]  loratadine (CLARITIN) 10 MG tablet  Take 10 mg by mouth daily.   Yes [provider]  Multiple Vitamins-Minerals (MULTIVITAMIN WITH MINERALS) tablet Take 1 tablet by mouth daily.   Yes [provider]  sevelamer carbonate (RENVELA) 800 MG tablet Take 1,600 mg by mouth 3 (three) times daily with meals.   Yes [provider]    Scheduled Meds:  Chlorhexidine Gluconate Cloth  6 each Topical Q0600   Chlorhexidine Gluconate Cloth  6 each Topical Q0600   midodrine  10 mg Oral TID WC   midodrine  5 mg  Oral Q M,W,F-HD   naLOXone (NARCAN)  injection  0.4 mg Intravenous Once   naLOXone (NARCAN)  injection  0.4 mg Intravenous Once   mouth rinse  15 mL Mouth Rinse 4 times per day   pantoprazole (PROTONIX) IV  40 mg Intravenous Q12H   potassium chloride  40 mEq Oral Once   sodium chloride flush  10-40 mL Intracatheter Q12H   sodium chloride flush  3 mL Intravenous Q12H   sodium chloride flush  3 mL Intravenous Q12H   sodium chloride flush  3 mL Intravenous Q12H   Continuous Infusions:  sodium chloride     ampicillin-sulbactam (UNASYN) IV Stopped (07/06/22 1706)   anticoagulant sodium citrate     dextrose Stopped (07/06/22 1745)   naloxone HCl (NARCAN) 4 mg in dextrose 5 % 250 mL infusion     norepinephrine (LEVOPHED) Adult infusion 10 mcg/min (07/06/22 2120)   PRN Meds:.sodium chloride, acetaminophen, alteplase, anticoagulant sodium citrate, fentaNYL (SUBLIMAZE) injection, guaiFENesin, haloperidol, heparin, hydrALAZINE, HYDROmorphone (DILAUDID) injection, ipratropium-albuterol, lidocaine (PF), lidocaine-prilocaine, metoprolol tartrate, ondansetron (ZOFRAN) IV, ondansetron (ZOFRAN) IV, mouth rinse, oxyCODONE, pentafluoroprop-tetrafluoroeth, senna-docusate, sodium chloride flush, sodium chloride flush   Active Hospital Problem list       Assessment & Plan:  Acute hypoxic hypercapnic respiratory failure secondary to pulmonary edema-slowly improving - Supplemental O2 as needed to maintain O2 saturations 88  to 92% - No evidence of hypoxia - Less likely to use BPAP/NIV     NSTEMI Hypotension secondary to acute blood loss anemia  PMHx: CAD, HLD, HTN   Cardiac Cath 10/16: minor luminal irregularities about 30% in LAD circumflex and RCA with no significant coronary artery disease.  EF 50-55% - Continuous cardiac monitoring - Vasopressors as needed to maintain MAP >55 - BP off cuff remains wholly unreliable - Would like to avoid placing a-line unless she decompensates - Ok to use low dose norepi and go off mentation as patient remains stable - Cardiology following, appreciate input   ESRD on HD MWF - continue Foley Catheter-assess need - Avoid nephrotoxic agents - Follow urine output, BMP - Ensure adequate renal perfusion, optimize oxygenation - Renal dose medications - HD overnight via trialysis catheter; may need HD again later today given encephalopathy and fluid overload?   Intake/Output Summary (Last 24 hours) at 07/07/2022 0940 Last data filed at 07/06/2022 2215 Gross per 24 hour  Intake 698.47 ml  Output 2500 ml  Net -1801.53 ml      Latest Ref Rng & Units 07/07/2022    5:00 AM 07/06/2022   11:38 AM 07/06/2022    6:14 AM  BMP  Glucose 70 - 99 mg/dL 110  111  115   BUN 8 - 23 mg/dL 42  37  32   Creatinine 0.44 - 1.00 mg/dL 4.64  3.58  2.66   Sodium 135 - 145 mmol/L 136  134  134   Potassium 3.5 - 5.1 mmol/L 4.0  3.8  3.2   Chloride 98 - 111 mmol/L 99  97  96   CO2 22 - 32 mmol/L $RemoveB'29  30  30   'HCSRuAEu$ Calcium 8.9 - 10.3 mg/dL 7.9  7.8  7.7      Hypomagnesia  - Trend BMP  - Replace electrolytes as indicated   ABLA Acute blood loss anemia secondary to right common femoral artery pseudoaneurysm  Thrombocytopenia   - Trend CBC - Monitor for s/sx of bleeding and transfuse for hgb <7 - Vascular surgery consulted pt to undergo vascular repair of pseudoaneurysm  Acute Metabolic Encephalopathy due to above Ongoing   CT Head 10/20: negative for acute intracranial  abnormality - Provide supportive care - received dilaudid - improving responsiveness with narcan doses - consider narcan gtt if becomes unresponsive now  Hypoglycemic Episodes - CBGs - Follow ICU hyper/hypoglycemia protocol  Best practice:  Diet:  NPO Pain/Anxiety/Delirium protocol (if indicated): No VAP protocol (if indicated): Not indicated DVT prophylaxis: SCD's GI prophylaxis: N/A Glucose control: N/A Central venous access: Right upper arm PICC line  Arterial line: N/A Foley: N/A Mobility:  bed rest  PT consulted: N/A Last date of multidisciplinary goals of care discussion [07/07/2022] Code Status: DNR Disposition: ICU    Will ask Palliative Care team to see pt again due to continued decline in pts condition to discuss goals of care      DVT/GI PRX  assessed I Assessed the need for Labs I Assessed the need for Foley I Assessed the need for Central Venous Line Family Discussion when available I Assessed the need for Mobilization I made an Assessment of medications to be adjusted accordingly Safety Risk assessment completed  CASE DISCUSSED IN MULTIDISCIPLINARY ROUNDS WITH ICU TEAM     Critical Care Time devoted to patient care services described in this note is 42 minutes.  Critical care was necessary to treat /prevent imminent and life-threatening deterioration.  Arcelia Jew MD PCCM

## 2022-07-07 NOTE — Evaluation (Signed)
Physical Therapy Evaluation Patient Details Name: Kathleen Sharp MRN: [id number] DOB: [birth year] Today's Date: 07/07/2022  History of Present Illness  Patient is a 76 year old female with ESRD on hemodialysis, HTN, cardiomegaly, who presents with shortness of breath. NSTEMI, cardiac cath showed nonobstructive CAD. Pseudoaneurysm right groin following procedure. Pressor support required for low blood pressure.  Deterioration in mental status on 10/21 with head CT negative.   Clinical Impression  Patient is agreeable to PT evaluation. She reports she is independent with mobility at baseline and lives at home alone usually. She is alert and following commands with increased time. She expressed multiple times that she wants to eat food as soon as possible.  Today, the patient required assistance for bed mobility. She also needs assistance for stand pivot transfer from bed to chair with one person assistance. Second person there for ICU line management. The patient reports no dizziness with upright activity. Peripheral blood pressure 105/57 in sitting and 111/58 after getting to the chair. All lines intact at end of session. The patient is pleased to be out of bed sitting in the chair. Recommend to continue PT to maximize independence and facilitate return to prior level of function.      Recommendations for follow up therapy are one component of a multi-disciplinary discharge planning process, led by the attending physician.  Recommendations may be updated based on patient status, additional functional criteria and insurance authorization.  Follow Up Recommendations Skilled nursing-short term rehab (<3 hours/day) Can patient physically be transported by private vehicle: No    Assistance Recommended at Discharge Frequent or constant Supervision/Assistance  Patient can return home with the following  A little help with walking and/or transfers;A little help with bathing/dressing/bathroom;Help with  stairs or ramp for entrance;Assist for transportation    Equipment Recommendations Rolling walker (2 wheels)  Recommendations for Other Services       Functional Status Assessment Patient has had a recent decline in their functional status and demonstrates the ability to make significant improvements in function in a reasonable and predictable amount of time.     Precautions / Restrictions Precautions Precautions: Fall Precaution Comments: CVP being used for blood pressure monitoring and to titrate levophed. Also has temporary left femoral cath with clearance for mobility (via secure chat) from Dr Priscella Mann and Dr Lanora Manis Restrictions Weight Bearing Restrictions: No      Mobility  Bed Mobility Overal bed mobility: Needs Assistance Bed Mobility: Supine to Sit, Sit to Supine     Supine to sit: Mod assist     General bed mobility comments: assistance for LE and trunk support. increased time required. no dizziness is reported with upright activity    Transfers Overall transfer level: Needs assistance Equipment used: Rolling walker (2 wheels) Transfers: Bed to chair/wheelchair/BSC     Step pivot transfers: Mod assist       General transfer comment: initial lifting assistance required for standing. steadying assistance provided during step transfer with cues for LE initiation of stepping and movement of rolling walker    Ambulation/Gait               General Gait Details: not assessed  Stairs            Wheelchair Mobility    Modified Rankin (Stroke Patients Only)       Balance Overall balance assessment: Needs assistance Sitting-balance support: Feet supported Sitting balance-Leahy Scale: Fair Sitting balance - Comments: no loss of balance in sitting postion. stand by assistance provided  for safety                                     Pertinent Vitals/Pain Pain Assessment Pain Assessment: No/denies pain    Home Living  Family/patient expects to be discharged to:: Private residence Living Arrangements: Alone Available Help at Discharge: Family;Available PRN/intermittently Type of Home: House Home Access: Stairs to enter   [employer] of Steps: 2            Prior Function Prior Level of Function : Independent/Modified Independent;Driving                     Hand Dominance        Extremity/Trunk Assessment   Upper Extremity Assessment Upper Extremity Assessment: Generalized weakness    Lower Extremity Assessment Lower Extremity Assessment: Generalized weakness       Communication   Communication: No difficulties  Cognition Arousal/Alertness: Awake/alert Behavior During Therapy: WFL for tasks assessed/performed Overall Cognitive Status: Within Functional Limits for tasks assessed                                 General Comments: patient is able to follow single step commands consistently. she perseverates on being able to eat and drink (asking for SLP)        General Comments General comments (skin integrity, edema, etc.): second person required for lines management with mobility. peripherial blood pressure values were not significantly changed with MAP in the upper 60's to low 70's with mobility (CVP not accurate during mobilizing). CVP was 12 after settled in the chair    Exercises     Assessment/Plan    PT Assessment Patient needs continued PT services  PT Problem List Decreased strength;Decreased range of motion;Decreased activity tolerance;Decreased balance;Decreased mobility;Decreased knowledge of precautions;Cardiopulmonary status limiting activity       PT Treatment Interventions DME instruction;Gait training;Stair training;Functional mobility training;Therapeutic activities;Therapeutic exercise;Balance training;Neuromuscular re-education;Cognitive remediation;Patient/family education    PT Goals (Current goals can be found in the Care  Plan section)  Acute Rehab PT Goals Patient Stated Goal: to be able to eat PT Goal Formulation: With patient Time For Goal Achievement: 07/21/22 Potential to Achieve Goals: Good    Frequency Min 2X/week     Co-evaluation PT/OT/SLP Co-Evaluation/Treatment: Yes Reason for Co-Treatment: Complexity of the patient's impairments (multi-system involvement) PT goals addressed during session: Mobility/safety with mobility         AM-PAC PT "6 Clicks" Mobility  Outcome Measure Help needed turning from your back to your side while in a flat bed without using bedrails?: A Little Help needed moving from lying on your back to sitting on the side of a flat bed without using bedrails?: A Lot Help needed moving to and from a bed to a chair (including a wheelchair)?: A Lot Help needed standing up from a chair using your arms (e.g., wheelchair or bedside chair)?: A Lot Help needed to walk in hospital room?: A Lot Help needed climbing 3-5 steps with a railing? : A Lot 6 Click Score: 13    End of Session Equipment Utilized During Treatment: Oxygen Activity Tolerance: Patient tolerated treatment well Patient left: in chair;with call bell/phone within reach Nurse Communication: Mobility status (discussed CVP monitoring, patient up in the chair, mobility status, etc.) PT Visit Diagnosis: Unsteadiness on feet (R26.81);Muscle weakness (generalized) (M62.81)  Time: 0926-1001 PT Time Calculation (min) (ACUTE ONLY): 35 min   Charges:   PT Evaluation $PT Eval High Complexity: 1 High PT Treatments $Therapeutic Activity: 8-22 mins        Minna Merritts, PT, MPT  Percell Locus 07/07/2022, 12:11 PM

## 2022-07-07 NOTE — Plan of Care (Signed)
Continuing with plan of care. 

## 2022-07-07 NOTE — Evaluation (Signed)
Occupational Therapy Evaluation Patient Details Name: Kathleen Sharp MRN: [id number] DOB: July 24, 1946 Today's Date: 07/07/2022   History of Present Illness Patient is a 76 year old female with ESRD on hemodialysis, HTN, cardiomegaly, who presents with shortness of breath. NSTEMI, cardiac cath showed nonobstructive CAD. Pseudoaneurysm right groin following procedure. Pressor support required for low blood pressure.  Deterioration in mental status on 10/21 with head CT negative.   Clinical Impression   Chart reviewed, clearance from MD for mobilization received. Pt is alert and oriented x,4 increased time required for processing with poor awareness of deficits. Pt is requesting to eat. PTA pt reports she was indep in ADL/IADL. Pt presents with deficits in strength, endurance, activity tolerance, balance, cognition all affecting safe and optimal ADL completion. Pt requires +2 throughout for lines/leads/safety. MOD A required for bed mobility, STS with MOD A, short amb transfer to bedside chair with MOD A +2 with RW. MOD A required for grooming tasks. Recommend STR to address functional deficits and to facilitate return to PLOF. RN aware of pt status. OT will continue to follow acutely.      Recommendations for follow up therapy are one component of a multi-disciplinary discharge planning process, led by the attending physician.  Recommendations may be updated based on patient status, additional functional criteria and insurance authorization.   Follow Up Recommendations  Skilled nursing-short term rehab (<3 hours/day)    Assistance Recommended at Discharge Frequent or constant Supervision/Assistance  Patient can return home with the following A lot of help with walking and/or transfers;A lot of help with bathing/dressing/bathroom    Functional Status Assessment  Patient has had a recent decline in their functional status and demonstrates the ability to make significant improvements in function in  a reasonable and predictable amount of time.  Equipment Recommendations  Other (comment) (per next venue of care)    Recommendations for Other Services       Precautions / Restrictions Precautions Precautions: Fall Precaution Comments: CVP being used for blood pressure monitoring and to titrate levophed. Also has temporary left femoral cath with clearance for mobility (via secure chat) from Dr Priscella Mann and Dr Lanora Manis Restrictions Weight Bearing Restrictions: No      Mobility Bed Mobility Overal bed mobility: Needs Assistance Bed Mobility: Supine to Sit     Supine to sit: Mod assist          Transfers Overall transfer level: Needs assistance Equipment used: Rolling walker (2 wheels) Transfers: Bed to chair/wheelchair/BSC       Step pivot transfers: Mod assist            Balance Overall balance assessment: Needs assistance Sitting-balance support: Feet supported Sitting balance-Leahy Scale: Fair     Standing balance support: During functional activity, Reliant on assistive device for balance, Bilateral upper extremity supported Standing balance-Leahy Scale: Poor                             ADL either performed or assessed with clinical judgement   ADL Overall ADL's : Needs assistance/impaired Eating/Feeding: NPO Eating/Feeding Details (indicate cue type and reason): extremely focused on wanting to eat Grooming: Moderate assistance;Sitting;Wash/dry face               Lower Body Dressing: Maximal assistance Lower Body Dressing Details (indicate cue type and reason): socks Toilet Transfer: Rolling walker (2 wheels);Moderate assistance Toilet Transfer Details (indicate cue type and reason): step pivot, simulated to bedside chair Toileting-  Clothing Manipulation and Hygiene: Maximal assistance               Vision Patient Visual Report: No change from baseline       Perception     Praxis      Pertinent Vitals/Pain Pain  Assessment Pain Assessment: No/denies pain     Hand Dominance     Extremity/Trunk Assessment Upper Extremity Assessment Upper Extremity Assessment: Generalized weakness   Lower Extremity Assessment Lower Extremity Assessment: Generalized weakness       Communication Communication Communication: No difficulties   Cognition Arousal/Alertness: Awake/alert Behavior During Therapy: WFL for tasks assessed/performed Overall Cognitive Status: Impaired/Different from baseline Area of Impairment: Safety/judgement, Awareness, Problem solving                         Safety/Judgement: Decreased awareness of deficits Awareness: Emergent Problem Solving: Requires verbal cues, Requires tactile cues, Decreased initiation       General Comments  peripheral BP values not significantly changed with MAP inthe upper 60s/low 70s with mobility , CVP 8-12 while sitting in chair, RN aware of pt status, spo2 >90% throughout on [place]    Exercises     Shoulder Instructions      Home Living Family/patient expects to be discharged to:: Private residence Living Arrangements: Alone Available Help at Discharge: Family;Available PRN/intermittently Type of Home: House Home Access: Stairs to enter [employer] of Steps: 2                              Prior Functioning/Environment Prior Level of Function : Independent/Modified Independent;Driving                        OT Problem List: Decreased strength;Decreased activity tolerance;Impaired balance (sitting and/or standing);Decreased safety awareness;Decreased knowledge of use of DME or AE      OT Treatment/Interventions: Self-care/ADL training;Patient/family education;Therapeutic exercise;Balance training;Energy conservation;DME and/or AE instruction;Therapeutic activities    OT Goals(Current goals can be found in the care plan section) Acute Rehab OT Goals Patient Stated Goal: eat OT Goal Formulation: With  patient Time For Goal Achievement: 07/22/22 Potential to Achieve Goals: Good ADL Goals Pt Will Perform Grooming: sitting;standing;with set-up Pt Will Transfer to Toilet: with supervision Pt Will Perform Toileting - Clothing Manipulation and hygiene: with supervision;sit to/from stand Pt Will Perform Tub/Shower Transfer: with supervision  OT Frequency: Min 2X/week    Co-evaluation PT/OT/SLP Co-Evaluation/Treatment: Yes Reason for Co-Treatment: Complexity of the patient's impairments (multi-system involvement);For patient/therapist safety;To address functional/ADL transfers PT goals addressed during session: Mobility/safety with mobility OT goals addressed during session: ADL's and self-care      AM-PAC OT "6 Clicks" Daily Activity     Outcome Measure Help from another person eating meals?: Total Help from another person taking care of personal grooming?: A Little Help from another person toileting, which includes using toliet, bedpan, or urinal?: A Lot Help from another person bathing (including washing, rinsing, drying)?: A Lot Help from another person to put on and taking off regular upper body clothing?: A Lot Help from another person to put on and taking off regular lower body clothing?: A Lot 6 Click Score: 12   End of Session Equipment Utilized During Treatment: Rolling walker (2 wheels);Oxygen Nurse Communication: Mobility status  Activity Tolerance: Patient tolerated treatment well Patient left: in chair;with call bell/phone within reach  OT Visit Diagnosis: Unsteadiness on feet (R26.81);Muscle weakness (  generalized) (M62.81)                Time: 1068-1661 OT Time Calculation (min): 35 min Charges:  OT General Charges $OT Visit: 1 Visit OT Evaluation $OT Eval High Complexity: 1 High OT Treatments $Self Care/Home Management : 8-22 mins  Shanon Payor, OTD OTR/L  07/07/22, 1:21 PM

## 2022-07-07 NOTE — Progress Notes (Signed)
PROGRESS NOTE    Kathleen Sharp  SEG:[id number] DOB: 1945-12-28 DOA: 06/24/2022 PCP: Adalberto Ill, MD    Brief Narrative:  76 year old with history of ESRD on HD MWF, HTN, cardiomegaly, HLD presented to the ED with shortness of breath.  COVID/influenza were negative, troponins of 89, lactic acidosis.  Overnight had developed SVT and troponin peaked greater than 14,000.  Patient was initially started on heparin drip and later refused cardiac catheterization.  Hospital course was also complicated by syncopal episode.  CTA of the chest and CT of head and neck were negative for acute pathology.  Echocardiogram showed normal EF.  Due to arrhythmia amiodarone was planned by cardiology team.  Eventually agreed to cardiac cath which showed nonobstructive CAD.  Hospital course complicated by acute blood loss anemia and right groin pseudoaneurysm for which vascular was consulted and stent was placed by their service.  Off-and-on she is also had low blood pressure requiring pressors, ICU team is following the patient.  10/21: Deterioration in mental status noted.  CT head reassuring.  Chest CT with evidence of interstitial edema.  No obvious aspiration however given the deterioration in mental status, normal VBG we suspect an aspiration event.  Started on intravenous Unasyn.  Nephrology contacted and would order ultrafiltration today for fluid removal  10/22: Patient's mental status did improve this morning.  She was upset she was unable to eat.  We agreed to allow her to attempt small amount of dysphagia diet.  Immediately after drinking a small amount of apple juice the patient began coughing and had subsequent deterioration in respiratory status and increased oxygen demand.  Currently on heated high flow nasal cannula.   Assessment & Plan:   Principal Problem:   NSTEMI (non-ST elevated myocardial infarction) ([place]) Active Problems:   Cardiac arrhythmia   Syncope and collapse   Pulmonary edema    Volume overload   Metabolic encephalopathy   Hyperkalemia   End stage renal disease (HCC)   Essential hypertension   Anemia of chronic renal failure, stage 5 (HCC)   Anemia, posthemorrhagic, acute   Pseudoaneurysm following procedure (HCC)  NSTEMI (non-ST elevated myocardial infarction) ([place]) -Troponin peaked greater than 14K.  Initially started on heparin drip and patient refused left heart catheterization which was eventually performed on 10/16 as patient was agreeable which revealed nonobstructive CAD.  Echo showed EF of 55%.  Eliquis and Plavix currently on hold   Acute anemia with hematemesis Hemorrhagic shock - Hemoglobin has drifted down.  CTA of the abdomen pelvis which is negative for any active bleed but shows focal outpouching of the right common femoral artery suspecting hematoma versus pseudoaneurysm.  Holding Eliquis and Plavix.  No further intervention by GI at this time. Off-and-on requiring pressors, on midodrine as well. 1 more U PRBC today, HB 7.5 but no active bleed noted for now Difficult to know if she truly is hypotensive or has artificially low reading.   Plan: difficult to ascertain true blood pressure given inaccuracy of cuff in the setting of anasarca discussed case with PCCM attending.  We will attempt to use mental status surrogate marker for hemodynamics.  Wean Levophed as tolerated.  Acute metabolic encephalopathy Suspected aspiration Patient had deterioration in mental status on 10/21.  Unclear etiology.  VBG reassuring.  CT head negative.  No evidence of acute bleed or CVA.  Suspect aspiration event.  No uremia noted on lab work.  Patient remains markedly fluid overloaded. 10/22: None suspected aspiration event Plan: IV Unasyn, pharmacy dosing  assistance Monitor mental status Avoid sedatives Monitor vitals and fever curve N.p.o. Prognosis guarded    Right groin hematoma/pseudoaneurysm 1.9cm - Status post stent placement by vascular on 10/19.      Cardiac arrhythmia For now this appears to have resolved, followed by cardiology  Volume overload - Mainly managed with hemodialysis   Hyperkalemia Monitor and address as necessary   End stage renal disease ([place]) Has a left upper extremity AV fistula.  Nephrology following.  Question regarding clotted fistula site.  Patient now has temporary venous dialysis access placed by PCCM team   Essential hypertension Home antihypertensives have been held   DVT prophylaxis: SCDs Code Status: DNR Family Communication: Daughter at bedside 10/22 Disposition Plan: Status is: Inpatient Remains inpatient appropriate because: Multiple acute issues requiring hospitalization   Level of care: ICU  Consultants:  Cardiology GI PCCM Palliative care  Procedures:  Multiple during hospitalization  Antimicrobials: Unasyn   Subjective: Seen and examined.  Much more short of breath today.  Objective: Vitals:   07/07/22 1030 07/07/22 1100 07/07/22 1130 07/07/22 1357  BP:      Pulse: 76 77 77   Resp: (!) 21 20 (!) 22   Temp:      TempSrc:      SpO2: 100% 100% 100% 93%  Weight:      Height:        Intake/Output Summary (Last 24 hours) at 07/07/2022 1410 Last data filed at 07/07/2022 1156 Gross per 24 hour  Intake 839.78 ml  Output 2500 ml  Net -1660.22 ml   Filed Weights   07/06/22 0600 07/06/22 1800 07/06/22 2232  Weight: 89.8 kg 92.5 kg 90 kg    Examination:  General exam: Appears ill.  Short of breath Respiratory system: Crackles bilaterally.  Increased work of breathing.  Heated high flow nasal cannula Cardiovascular system: S1-S2, RRR, no murmurs, 2+ pitting edema BLE Gastrointestinal system: Soft, NT/ND, normal bowel sounds Central nervous system: Lethargic.  Oriented to person.  No focal deficits Extremities: Decreased power symmetrically Skin: No rashes, lesions or ulcers Psychiatry: Judgement and insight appear impaired. Mood & affect flattened.     Data  Reviewed: I have personally reviewed following labs and imaging studies  CBC: Recent Labs  Lab 07/03/22 0353 07/03/22 1153 06/19/2022 0540 07/09/2022 1222 07/05/22 0356 07/05/22 1222 07/06/22 0614 07/06/22 1138 07/06/22 1739 07/07/22 0010 07/07/22 0500 07/07/22 1232  WBC 18.5*   < > 27.4*  --  22.8*  --  18.0* 14.4*  --   --  15.7*  --   NEUTROABS 12.2*  --  20.7*  --  17.6*  --  14.5*  --   --   --  12.0*  --   HGB 6.3*   < > 7.1*   < > 7.9*  8.0*   < > 8.7* 7.8* 7.8* 8.1* 8.1* 8.2*  HCT 19.9*   < > 21.0*   < > 23.5*  24.3*   < > 26.6* 23.8* 23.7* 25.3* 25.4* 25.4*  MCV 81.6   < > 82.7  --  85.0  --  85.5 85.6  --   --  88.5  --   PLT 141*   < > 125*  --  128*  --  148* 127*  --   --  172  --    < > = values in this interval not displayed.   Basic Metabolic Panel: Recent Labs  Lab 07/07/2022 0540 06/26/2022 1222 07/05/22 0356 07/05/22 2057 07/06/22 3235 07/06/22  1138 07/07/22 0500  NA 133*  --  132* 130* 134* 134* 136  K 3.5  --  4.4 4.2 3.2* 3.8 4.0  CL 95*  --  93* 95* 96* 97* 99  CO2 27  --  $R'26 27 30 30 29  'qO$ GLUCOSE 144*   < > 125* 111* 115* 111* 110*  BUN 80*  --  86* 70* 32* 37* 42*  CREATININE 4.26*  --  5.55* 5.19* 2.66* 3.58* 4.64*  CALCIUM 7.8*  --  8.2* 8.0* 7.7* 7.8* 7.9*  MG 1.6*  --  2.1 1.9 1.6*  --  1.7  PHOS 2.9  --  4.3  --  2.2* 3.4 5.2*   < > = values in this interval not displayed.   GFR: Estimated Creatinine Clearance: 11.1 mL/min (A) (by C-G formula based on SCr of 4.64 mg/dL (H)). Liver Function Tests: Recent Labs  Lab 07/06/22 1138  ALBUMIN 2.5*   No results for input(s): "LIPASE", "AMYLASE" in the last 168 hours. No results for input(s): "AMMONIA" in the last 168 hours. Coagulation Profile: Recent Labs  Lab 07/03/22 1530  INR 2.2*   Cardiac Enzymes: No results for input(s): "CKTOTAL", "CKMB", "CKMBINDEX", "TROPONINI" in the last 168 hours. BNP (last 3 results) No results for input(s): "PROBNP" in the last 8760 hours. HbA1C: No  results for input(s): "HGBA1C" in the last 72 hours. CBG: Recent Labs  Lab 07/06/22 1745 07/07/22 0005 07/07/22 0607 07/07/22 0919 07/07/22 1238  GLUCAP 418* 112* 110* 96 93   Lipid Profile: No results for input(s): "CHOL", "HDL", "LDLCALC", "TRIG", "CHOLHDL", "LDLDIRECT" in the last 72 hours. Thyroid Function Tests: No results for input(s): "TSH", "T4TOTAL", "FREET4", "T3FREE", "THYROIDAB" in the last 72 hours. Anemia Panel: No results for input(s): "VITAMINB12", "FOLATE", "FERRITIN", "TIBC", "IRON", "RETICCTPCT" in the last 72 hours. Sepsis Labs: Recent Labs  Lab 07/03/22 1155 07/03/22 1530 07/05/22 1645 07/05/22 2057  LATICACIDVEN 1.3 1.3 1.3 1.0    No results found for this or any previous visit (from the past 240 hour(s)).       Radiology Studies: DG Chest Port 1 View  Result Date: 07/07/2022 CLINICAL DATA:  Hypoxia. EXAM: PORTABLE CHEST 1 VIEW COMPARISON:  One view chest x-ray 07/05/2022. CT of the chest 07/06/2022. FINDINGS: Heart is enlarged. Progressive airspace disease is present at both lower lobes. Mild edema is increasing. Right-sided PICC line is stable in position. IMPRESSION: 1. Progressive airspace disease at both lower lobes. This may represent atelectasis or infection. 2. Increasing mild edema. Electronically Signed   By: San Morelle M.D.   On: 07/07/2022 14:00   US Venous Img Upper Uni Left (DVT)  Result Date: 07/07/2022 CLINICAL DATA:  Left upper extremity pain and swelling. History of arteriovenous fistula. EXAM: LEFT UPPER EXTREMITY VENOUS DOPPLER ULTRASOUND TECHNIQUE: Gray-scale sonography with graded compression, as well as color Doppler and duplex ultrasound were performed to evaluate the upper extremity deep venous system from the level of the subclavian vein and including the jugular, axillary, basilic, radial, ulnar and upper cephalic vein. Spectral Doppler was utilized to evaluate flow at rest and with distal augmentation maneuvers.  COMPARISON:  None Available. FINDINGS: Contralateral Subclavian Vein: Respiratory phasicity is normal and symmetric with the symptomatic side. No evidence of thrombus. Normal compressibility. Internal Jugular Vein: No evidence of thrombus. Normal compressibility, respiratory phasicity and response to augmentation. Subclavian Vein: No evidence of thrombus. Normal compressibility, respiratory phasicity and response to augmentation. Axillary Vein: No evidence of thrombus. Normal compressibility, respiratory phasicity and response  to augmentation. Cephalic Vein: No evidence of thrombus. Normal compressibility, respiratory phasicity and response to augmentation. Basilic Vein: Unable to visualize. Brachial Veins: No evidence of thrombus. Normal compressibility, respiratory phasicity and response to augmentation. Radial Veins: No evidence of thrombus. Normal compressibility, respiratory phasicity and response to augmentation. Ulnar Veins: No evidence of thrombus. Normal compressibility, respiratory phasicity and response to augmentation. Venous Reflux:  None visualized. Other Findings: Arteriovenous graft is patent. Superficial subcutaneous edema is noted. IMPRESSION: No evidence of DVT within the left upper extremity. Patent arteriovenous graft. Electronically Signed   By: Jacqulynn Cadet M.D.   On: 07/07/2022 07:09   CT CHEST WO CONTRAST  Result Date: 07/06/2022 CLINICAL DATA:  Chronic dyspnea of unclear etiology EXAM: CT CHEST WITHOUT CONTRAST TECHNIQUE: Multidetector CT imaging of the chest was performed following the standard protocol without IV contrast. RADIATION DOSE REDUCTION: This exam was performed according to the departmental dose-optimization program which includes automated exposure control, adjustment of the mA and/or kV according to patient size and/or use of iterative reconstruction technique. COMPARISON:  Chest radiograph dated 07/05/2022 FINDINGS: Cardiovascular: Multichamber cardiomegaly. No  significant pericardial fluid/thickening. Aortic atherosclerosis. Coronary artery calcifications. Aortic valvular calcifications. Anatomic variant common origin of the brachiocephalic and common carotid arteries. Mediastinum/Nodes: Partially imaged thyroid gland without nodules meeting criteria for imaging follow-up by size. Normal esophagus. No pathologically enlarged axillary, supraclavicular, mediastinal, or hilar lymph nodes. Lungs/Pleura: The central airways are patent. Crescentic appearance of the lower trachea. Minimal upper lobe predominant centrilobular emphysema. Bilateral upper lobe subpleural reticular opacities. No focal consolidation. 3 mm right upper lobe subpleural pulmonary nodule (3:36). No follow-up needed if patient is low-risk.This recommendation follows the consensus statement: Guidelines for Management of Incidental Pulmonary Nodules Detected on CT Images: From the Fleischner Society 2017; Radiology 2017; 284:228-243. Right basilar relaxation atelectasis adjacent to the pleural effusion. Middle lobe, lingular, and left lower lobe subsegmental atelectasis. Mild interlobular septal thickening. No pneumothorax. Trace bilateral pleural effusions. Upper abdomen: Punctate calcified granuloma in hepatic segment 2. Cholelithiasis. No adrenal nodules. Bilateral atrophic kidneys. Bilateral cysts, including a peripherally calcified subcentimeter cyst in the right upper lobe. Small volume free fluid. Musculoskeletal: No acute or abnormal lytic or blastic osseous lesions. Diffuse body wall edema. IMPRESSION: 1. Multichamber cardiomegaly with trace bilateral pleural effusions and mild interlobular septal thickening suggestive of mild pulmonary edema. 2. Crescentic appearance of the lower trachea, which can be seen in the setting of tracheomalacia. 3. Bilateral upper lobe subpleural reticular opacities suggest a component of interstitial lung disease. 4. Cholelithiasis. 5. Coronary artery disease. Aortic  Atherosclerosis (ICD10-I70.0) and Emphysema (ICD10-J43.9). Electronically Signed   By: Darrin Nipper M.D.   On: 07/06/2022 09:48   CT HEAD WO CONTRAST (5MM)  Result Date: 07/06/2022 CLINICAL DATA:  Mental status change with unknown cause EXAM: CT HEAD WITHOUT CONTRAST TECHNIQUE: Contiguous axial images were obtained from the base of the skull through the vertex without intravenous contrast. RADIATION DOSE REDUCTION: This exam was performed according to the departmental dose-optimization program which includes automated exposure control, adjustment of the mA and/or kV according to patient size and/or use of iterative reconstruction technique. COMPARISON:  07/03/2022 FINDINGS: Brain: No evidence of acute infarction, hemorrhage, hydrocephalus, extra-axial collection or mass lesion/mass effect. Chronic small vessel ischemia in the cerebral white matter. Vascular: No hyperdense vessel or unexpected calcification. Skull: Normal. Negative for fracture or focal lesion. Sinuses/Orbits: Mucosal thickening in the left sphenoid sinus which is chronic and stable. Other: Motion degraded, subtle findings could be obscured. IMPRESSION: Motion degraded  head CT without acute or interval finding. Electronically Signed   By: Jorje Guild M.D.   On: 07/06/2022 09:42   DG Chest 1 View  Result Date: 07/05/2022 CLINICAL DATA:  Shortness of breath EXAM: CHEST  1 VIEW COMPARISON:  07/05/2022, 07/03/2022, 08/14/2020 FINDINGS: Cardiomegaly with vascular congestion and pulmonary edema. Aortic atherosclerosis. Right upper extremity central venous catheter tip at the distal SVC. No pneumothorax IMPRESSION: Cardiomegaly with similar vascular congestion and pulmonary edema. Possible left pleural effusion Electronically Signed   By: Donavan Foil M.D.   On: 07/05/2022 21:11   DG Chest Port 1 View  Result Date: 07/05/2022 CLINICAL DATA:  Acute respiratory failure EXAM: PORTABLE CHEST 1 VIEW COMPARISON:  07/03/2022 FINDINGS: Transverse  diameter of heart is increased. Central pulmonary vessels are prominent. There is increase in interstitial markings in parahilar regions Increased density in left lower lung field may be related to cardiomegaly or possibly underlying atelectasis/pneumonia. There is blunting of left lateral CP angle suggesting small effusion. There is no pneumothorax. Tip of PICC line is seen in the region of superior vena cava close to the right atrium. IMPRESSION: Cardiomegaly. Prominence of central pulmonary vessels and increased interstitial markings in parahilar regions suggest CHF. Increased density in left lower lung fields may be due to pleural effusion and possibly underlying infiltrate. Electronically Signed   By: Elmer Picker M.D.   On: 07/05/2022 19:29        Scheduled Meds:  Chlorhexidine Gluconate Cloth  6 each Topical Q0600   Chlorhexidine Gluconate Cloth  6 each Topical Q0600   furosemide  40 mg Intravenous Once   HYDROmorphone       midodrine  5 mg Oral Q M,W,F-HD   naLOXone (NARCAN)  injection  0.4 mg Intravenous Once   naLOXone (NARCAN)  injection  0.4 mg Intravenous Once   mouth rinse  15 mL Mouth Rinse 4 times per day   pantoprazole (PROTONIX) IV  40 mg Intravenous Q12H   potassium chloride  40 mEq Oral Once   sodium chloride flush  10-40 mL Intracatheter Q12H   sodium chloride flush  3 mL Intravenous Q12H   sodium chloride flush  3 mL Intravenous Q12H   sodium chloride flush  3 mL Intravenous Q12H   Continuous Infusions:  sodium chloride     ampicillin-sulbactam (UNASYN) IV 3 g (07/07/22 1251)   anticoagulant sodium citrate     naloxone HCl (NARCAN) 4 mg in dextrose 5 % 250 mL infusion     norepinephrine (LEVOPHED) Adult infusion 5 mcg/min (07/07/22 1156)     LOS: 13 days      Sidney Ace, MD [facility]   If 7PM-7AM, please contact night-coverage  07/07/2022, 2:10 PM

## 2022-07-07 NOTE — Significant Event (Signed)
Significant deterioration in resp status this evening.  STAT CXR with marked fluid overload.  Patient with decreased LOC, increased WOB  Spoke with family over phone, they are coming into the hospital now.  Patient appears to be suffering.  I recommend transition to comfort measures  Case d/w ICU MD, charge RN, nephrology.   Nephrologist sending in HD RN for ultrafiltration, however I feel this is a temporary measure at best  Signout given to overnight cross cover NP [facility]  Ralene Muskrat MD

## 2022-07-07 NOTE — Progress Notes (Signed)
SUBJECTIVE: Patient is a 76 year old female with a history of ESRD on dialysis MWF, hypertension, cardiomegaly, hyperlipidemia, who presented to the ED on 06/17/2022 with shortness of breath, palpitations. Denied chest pain at admission. Troponin level peaked at 14,207.   On 10/10, patient had an episode of nonsustained ventricular tachycardia followed by atrial fibrillation associated with dizziness. Patient denies losing consciousness. IV amiodoarone started.    Cardiac cath rescheduled for 10/12, however, patient developed mental status changes, cath was cancelled.    On 10/15 overnight patient became unresponsive, bradycardic, hypotensive. IV amiodarone discontinued. Patient transferred to ICU.    Cardiac cath 07/05/2022 revealed minor luminal irregularities about 30% in LAD circumflex and RCA with no significant coronary artery disease.  LVEF on echo was 55% thus LV gram was deferred   Acute anemia- unclear etiology, Hgb now 6.3. Patient received one unit PRBC. Patient having CT abdomen pelvis this morning.    CT abdomen pelvis on 07/03/22 revealed possible pseudoaneurysm. Ultrasound confirmed a partially thrombosed pseudoaneurysm measuring approximately 1.9 x 0.4 x 1.3 cm.    07/15/2022 Patient underwent right lower extremity angiogram with stent placement to pseudoaneurysm.    Vitals:   07/07/22 1000 07/07/22 1030 07/07/22 1100 07/07/22 1130  BP: (!) 117/50     Pulse: 76 76 77 77  Resp: 19 (!) 21 20 (!) 22  Temp:      TempSrc:      SpO2: 98% 100% 100% 100%  Weight:      Height:        Intake/Output Summary (Last 24 hours) at 07/07/2022 1157 Last data filed at 07/07/2022 1156 Gross per 24 hour  Intake 839.78 ml  Output 2500 ml  Net -1660.22 ml    LABS: Basic Metabolic Panel: Recent Labs    07/06/22 0614 07/06/22 1138 07/07/22 0500  NA 134* 134* 136  K 3.2* 3.8 4.0  CL 96* 97* 99  CO2 $Re'30 30 29  'tJh$ GLUCOSE 115* 111* 110*  BUN 32* 37* 42*  CREATININE 2.66* 3.58* 4.64*   CALCIUM 7.7* 7.8* 7.9*  MG 1.6*  --  1.7  PHOS 2.2* 3.4 5.2*   Liver Function Tests: Recent Labs    07/06/22 1138  ALBUMIN 2.5*   No results for input(s): "LIPASE", "AMYLASE" in the last 72 hours. CBC: Recent Labs    07/06/22 0614 07/06/22 1138 07/06/22 1739 07/07/22 0010 07/07/22 0500  WBC 18.0* 14.4*  --   --  15.7*  NEUTROABS 14.5*  --   --   --  12.0*  HGB 8.7* 7.8*   < > 8.1* 8.1*  HCT 26.6* 23.8*   < > 25.3* 25.4*  MCV 85.5 85.6  --   --  88.5  PLT 148* 127*  --   --  172   < > = values in this interval not displayed.   Cardiac Enzymes: No results for input(s): "CKTOTAL", "CKMB", "CKMBINDEX", "TROPONINI" in the last 72 hours. BNP: Invalid input(s): "POCBNP" D-Dimer: No results for input(s): "DDIMER" in the last 72 hours. Hemoglobin A1C: No results for input(s): "HGBA1C" in the last 72 hours. Fasting Lipid Panel: No results for input(s): "CHOL", "HDL", "LDLCALC", "TRIG", "CHOLHDL", "LDLDIRECT" in the last 72 hours. Thyroid Function Tests: No results for input(s): "TSH", "T4TOTAL", "T3FREE", "THYROIDAB" in the last 72 hours.  Invalid input(s): "FREET3" Anemia Panel: No results for input(s): "VITAMINB12", "FOLATE", "FERRITIN", "TIBC", "IRON", "RETICCTPCT" in the last 72 hours.   PHYSICAL EXAM General: Well developed, well nourished, in no acute distress HEENT:  Normocephalic and atramatic Neck:  No JVD.  Lungs: Clear bilaterally to auscultation and percussion. Heart: HRRR . Normal S1 and S2 without gallops or murmurs.  Abdomen: Bowel sounds are positive, abdomen soft and non-tender  Msk:  Back normal, normal gait. Normal strength and tone for age. Extremities: No clubbing, cyanosis or edema.   Neuro: Alert and oriented X 3. Psych:  Good affect, responds appropriately  TELEMETRY: sinus rhythm, HR 77 bpm  ASSESSMENT AND PLAN: Patient currently sitting in chair at bedside. Denies chest pain. Shortness of breath stable. Patient does not remember this  provider despite seeing patient daily since admission. Patient remains on pressors for b/p. Patient awaiting swallow study. Will continue to follow.   Principal Problem:   NSTEMI (non-ST elevated myocardial infarction) ([place]) Active Problems:   Anemia of chronic renal failure, stage 5 (HCC)   End stage renal disease (HCC)   Essential hypertension   Volume overload   Hyperkalemia   Pulmonary edema   Cardiac arrhythmia   Syncope and collapse   Metabolic encephalopathy   Anemia, posthemorrhagic, acute   Pseudoaneurysm following procedure ([place])    Kathleen Betzer, FNP-C 07/07/2022 11:57 AM

## 2022-07-08 DIAGNOSIS — N186 End stage renal disease: Secondary | ICD-10-CM | POA: Diagnosis not present

## 2022-07-08 DIAGNOSIS — N185 Chronic kidney disease, stage 5: Secondary | ICD-10-CM

## 2022-07-08 DIAGNOSIS — I214 Non-ST elevation (NSTEMI) myocardial infarction: Secondary | ICD-10-CM | POA: Diagnosis not present

## 2022-07-08 DIAGNOSIS — T81718A Complication of other artery following a procedure, not elsewhere classified, initial encounter: Secondary | ICD-10-CM | POA: Diagnosis not present

## 2022-07-08 DIAGNOSIS — D631 Anemia in chronic kidney disease: Secondary | ICD-10-CM

## 2022-07-08 LAB — BASIC METABOLIC PANEL
Anion gap: 9 (ref 5–15)
BUN: 48 mg/dL — ABNORMAL HIGH (ref 8–23)
CO2: 26 mmol/L (ref 22–32)
Calcium: 7.8 mg/dL — ABNORMAL LOW (ref 8.9–10.3)
Chloride: 101 mmol/L (ref 98–111)
Creatinine, Ser: 5.97 mg/dL — ABNORMAL HIGH (ref 0.44–1.00)
GFR, Estimated: 7 mL/min — ABNORMAL LOW (ref >60)
Glucose, Bld: 60 mg/dL — ABNORMAL LOW (ref 70–99)
Potassium: 4.3 mmol/L (ref 3.5–5.1)
Sodium: 136 mmol/L (ref 135–145)

## 2022-07-08 LAB — CBC WITH DIFFERENTIAL/PLATELET
Abs Immature Granulocytes: 0.08 10*3/uL — ABNORMAL HIGH (ref 0.00–0.07)
Basophils Absolute: 0 10*3/uL (ref 0.0–0.1)
Basophils Relative: 0 %
Eosinophils Absolute: 0.1 10*3/uL (ref 0.0–0.5)
Eosinophils Relative: 1 %
HCT: 23.2 % — ABNORMAL LOW (ref 36.0–46.0)
Hemoglobin: 7.4 g/dL — ABNORMAL LOW (ref 12.0–15.0)
Immature Granulocytes: 1 %
Lymphocytes Relative: 7 %
Lymphs Abs: 0.9 10*3/uL (ref 0.7–4.0)
MCH: 28.6 pg (ref 26.0–34.0)
MCHC: 31.9 g/dL (ref 30.0–36.0)
MCV: 89.6 fL (ref 80.0–100.0)
Monocytes Absolute: 1.9 10*3/uL — ABNORMAL HIGH (ref 0.1–1.0)
Monocytes Relative: 16 %
Neutro Abs: 9 10*3/uL — ABNORMAL HIGH (ref 1.7–7.7)
Neutrophils Relative %: 75 %
Platelets: 161 10*3/uL (ref 150–400)
RBC: 2.59 MIL/uL — ABNORMAL LOW (ref 3.87–5.11)
RDW: 16.5 % — ABNORMAL HIGH (ref 11.5–15.5)
WBC: 12 10*3/uL — ABNORMAL HIGH (ref 4.0–10.5)
nRBC: 0.2 % (ref 0.0–0.2)

## 2022-07-08 LAB — GLUCOSE, CAPILLARY: Glucose-Capillary: 56 mg/dL — ABNORMAL LOW (ref 70–99)

## 2022-07-08 LAB — PHOSPHORUS: Phosphorus: 6.1 mg/dL — ABNORMAL HIGH (ref 2.5–4.6)

## 2022-07-08 LAB — MAGNESIUM: Magnesium: 2 mg/dL (ref 1.7–2.4)

## 2022-07-08 MED ORDER — ACETAMINOPHEN 650 MG RE SUPP: 650.0000 mg | Freq: Four times a day (QID) | RECTAL | Status: DC | PRN

## 2022-07-08 MED ORDER — SODIUM CHLORIDE 0.9 % IV SOLN
0.5000 mg/h | INTRAVENOUS | Status: DC
  Administered 2022-07-08: 0.5 mg/h via INTRAVENOUS
  Filled 2022-07-08: qty 5

## 2022-07-08 MED ORDER — HYDROMORPHONE BOLUS VIA INFUSION: 0.5000 mg | INTRAVENOUS | Status: DC | PRN

## 2022-07-08 MED ORDER — POLYVINYL ALCOHOL 1.4 % OP SOLN: 1.0000 [drp] | Freq: Four times a day (QID) | OPHTHALMIC | Status: DC | PRN

## 2022-07-08 MED ORDER — CHLORHEXIDINE GLUCONATE CLOTH 2 % EX PADS
6.0000 | MEDICATED_PAD | Freq: Every day | CUTANEOUS | Status: DC
  Administered 2022-07-08: 6 via TOPICAL

## 2022-07-08 MED ORDER — ACETAMINOPHEN 325 MG PO TABS: 650.0000 mg | ORAL_TABLET | Freq: Four times a day (QID) | ORAL | Status: DC | PRN

## 2022-07-08 MED ORDER — DEXTROSE 50 % IV SOLN
12.5000 g | INTRAVENOUS | Status: AC
  Administered 2022-07-08: 12.5 g via INTRAVENOUS

## 2022-07-08 MED ORDER — DEXTROSE 50 % IV SOLN
INTRAVENOUS | Status: AC
  Filled 2022-07-08: qty 50

## 2022-07-17 NOTE — Progress Notes (Signed)
SUBJECTIVE: Patient is more alert today   Vitals:   07-15-22 0500 15-Jul-2022 0600 2022-07-15 0728 Jul 15, 2022 0730  BP:   (!) 82/58   Pulse: 76 73 69 68  Resp: (!) $RemoveB'25 18 17 10  'iHqwBcHU$ Temp:      TempSrc:      SpO2: 98% 100% 100% (!) 74%  Weight:      Height:        Intake/Output Summary (Last 24 hours) at 07-15-22 0901 Last data filed at 07/15/22 0336 Gross per 24 hour  Intake 306.02 ml  Output --  Net 306.02 ml    LABS: Basic Metabolic Panel: Recent Labs    07/07/22 0500 Jul 15, 2022 0335  NA 136 136  K 4.0 4.3  CL 99 101  CO2 29 26  GLUCOSE 110* 60*  BUN 42* 48*  CREATININE 4.64* 5.97*  CALCIUM 7.9* 7.8*  MG 1.7 2.0  PHOS 5.2* 6.1*   Liver Function Tests: Recent Labs    07/06/22 1138  ALBUMIN 2.5*   No results for input(s): "LIPASE", "AMYLASE" in the last 72 hours. CBC: Recent Labs    07/07/22 0500 07/07/22 1232 07/15/2022 0335  WBC 15.7*  --  12.0*  NEUTROABS 12.0*  --  9.0*  HGB 8.1* 8.2* 7.4*  HCT 25.4* 25.4* 23.2*  MCV 88.5  --  89.6  PLT 172  --  161   Cardiac Enzymes: No results for input(s): "CKTOTAL", "CKMB", "CKMBINDEX", "TROPONINI" in the last 72 hours. BNP: Invalid input(s): "POCBNP" D-Dimer: No results for input(s): "DDIMER" in the last 72 hours. Hemoglobin A1C: No results for input(s): "HGBA1C" in the last 72 hours. Fasting Lipid Panel: No results for input(s): "CHOL", "HDL", "LDLCALC", "TRIG", "CHOLHDL", "LDLDIRECT" in the last 72 hours. Thyroid Function Tests: No results for input(s): "TSH", "T4TOTAL", "T3FREE", "THYROIDAB" in the last 72 hours.  Invalid input(s): "FREET3" Anemia Panel: No results for input(s): "VITAMINB12", "FOLATE", "FERRITIN", "TIBC", "IRON", "RETICCTPCT" in the last 72 hours.   PHYSICAL EXAM General: Well developed, well nourished, in no acute distress HEENT:  Normocephalic and atramatic Neck:  No JVD.  Lungs: Clear bilaterally to auscultation and percussion. Heart: HRRR . Normal S1 and S2 without gallops or  murmurs.  Abdomen: Bowel sounds are positive, abdomen soft and non-tender  Msk:  Back normal, normal gait. Normal strength and tone for age. Extremities: No clubbing, cyanosis or edema.   Neuro: Alert and oriented X 3. Psych:  Good affect, responds appropriately  TELEMETRY: Not on monitor  ASSESSMENT AND PLAN: Status post non-STEMI with nonobstructive coronary artery disease most likely cause of non-STEMI due to coronary spasm.  Patient had stenting of the right femoral artery after AV fistula.  Patient continues to have anemia most likely due to GI bleed.  Eliquis and Plavix been discontinued.  And GI is following the patient.  Principal Problem:   NSTEMI (non-ST elevated myocardial infarction) ([facility]) Active Problems:   Anemia of chronic renal failure, stage 5 (HCC)   End stage renal disease (HCC)   Essential hypertension   Volume overload   Hyperkalemia   Pulmonary edema   Cardiac arrhythmia   Syncope and collapse   Metabolic encephalopathy   Anemia, posthemorrhagic, acute   Pseudoaneurysm following procedure (HCC)    Kalijah Westfall A, MD, [facility] July 15, 2022 9:01 AM

## 2022-07-17 NOTE — Progress Notes (Signed)
PROGRESS NOTE    Kathleen Sharp  WGX:[id number] DOB: 06-19-1946 DOA: 07/06/2022 PCP: Johnney Killian, MD    Brief Narrative:  76 year old with history of ESRD on HD MWF, HTN, cardiomegaly, HLD presented to the ED with shortness of breath.  COVID/influenza were negative, troponins of 89, lactic acidosis.  Overnight had developed SVT and troponin peaked greater than 14,000.  Patient was initially started on heparin drip and later refused cardiac catheterization.  Hospital course was also complicated by syncopal episode.  CTA of the chest and CT of head and neck were negative for acute pathology.  Echocardiogram showed normal EF.  Due to arrhythmia amiodarone was planned by cardiology team.  Eventually agreed to cardiac cath which showed nonobstructive CAD.  Hospital course complicated by acute blood loss anemia and right groin pseudoaneurysm for which vascular was consulted and stent was placed by their service.  Off-and-on she is also had low blood pressure requiring pressors, ICU team is following the patient.  10/21: Deterioration in mental status noted.  CT head reassuring.  Chest CT with evidence of interstitial edema.  No obvious aspiration however given the deterioration in mental status, normal VBG we suspect an aspiration event.  Started on intravenous Unasyn.  Nephrology contacted and would order ultrafiltration today for fluid removal  10/22: Patient's mental status did improve this morning.  She was upset she was unable to eat.  We agreed to allow her to attempt small amount of dysphagia diet.  Immediately after drinking a small amount of apple juice the patient began coughing and had subsequent deterioration in respiratory status and increased oxygen demand.  Currently on heated high flow nasal cannula.  10/23: Patient was transitioned to full comfort measures on 10/22.  Appreciate help from ICU and overnight cross cover team.  Appears more comfortable this morning.  Palliative care  involved.  Converted to Dilaudid gtt.  Niece at bedside.  Appreciative of care.  Patient wishes to eat.  At this time I feel it is too high risk as small amount of p.o. intake can greatly increase the amount of suffering and shortness of breath.   Assessment & Plan:   Principal Problem:   NSTEMI (non-ST elevated myocardial infarction) (HCC) Active Problems:   Cardiac arrhythmia   Syncope and collapse   Pulmonary edema   Volume overload   Metabolic encephalopathy   Hyperkalemia   End stage renal disease (HCC)   Essential hypertension   Anemia of chronic renal failure, stage 5 (HCC)   Anemia, posthemorrhagic, acute   Pseudoaneurysm following procedure [facility])  Patient transition to full comfort measures.  All medications not focused on patient comfort have been discontinued.  Dilaudid gtt., Ativan as needed, Robinul.  Discontinue cardiac monitoring.  Allow for natural death.  Case discussed with bedside RN and niece at bedside.  Patient may transition to MedSurg status.  DVT prophylaxis: Comfort Code Status: DNR Family Communication: Niece at bedside 10/22, 10/23 Disposition Plan: Status is: Inpatient Remains inpatient appropriate because: Full   Level of care: Med-Surg  Consultants:  Cardiology GI PCCM Palliative care  Procedures:  Multiple during hospitalization  Antimicrobials:   Subjective: No visible distress  Objective: Vitals:   2022/07/27 1000 July 27, 2022 1100 2022-07-27 1200 07-27-2022 1400  BP:      Pulse: (!) 56 61 (!) 57 (!) 53  Resp:      Temp:      TempSrc:      SpO2: 97% 93% 98% 97%  Weight:  Height:        Intake/Output Summary (Last 24 hours) at July 10, 2022 1426 Last data filed at July 10, 2022 1146 Gross per 24 hour  Intake 182.87 ml  Output --  Net 182.87 ml   Filed Weights   07/06/22 0600 07/06/22 1800 07/06/22 2232  Weight: 89.8 kg 92.5 kg 90 kg    Examination:  Limited exam due to comfort measure status  General exam: Appears ill.   Short of breath Respiratory system: Bilateral scattered crackles.  Normal work of breathing.  2 L Cardiovascular system: S1-S2, RRR, no murmurs, 2+ pitting edema BLE     Data Reviewed: I have personally reviewed following labs and imaging studies  CBC: Recent Labs  Lab 07/14/2022 0540 07/03/2022 1222 07/05/22 0356 07/05/22 1222 07/06/22 0614 07/06/22 1138 07/06/22 1739 07/07/22 0010 07/07/22 0500 07/07/22 1232 2022/07/10 0335  WBC 27.4*  --  22.8*  --  18.0* 14.4*  --   --  15.7*  --  12.0*  NEUTROABS 20.7*  --  17.6*  --  14.5*  --   --   --  12.0*  --  9.0*  HGB 7.1*   < > 7.9*  8.0*   < > 8.7* 7.8* 7.8* 8.1* 8.1* 8.2* 7.4*  HCT 21.0*   < > 23.5*  24.3*   < > 26.6* 23.8* 23.7* 25.3* 25.4* 25.4* 23.2*  MCV 82.7  --  85.0  --  85.5 85.6  --   --  88.5  --  89.6  PLT 125*  --  128*  --  148* 127*  --   --  172  --  161   < > = values in this interval not displayed.   Basic Metabolic Panel: Recent Labs  Lab 07/05/22 0356 07/05/22 2057 07/06/22 0614 07/06/22 1138 07/07/22 0500 2022-07-10 0335  NA 132* 130* 134* 134* 136 136  K 4.4 4.2 3.2* 3.8 4.0 4.3  CL 93* 95* 96* 97* 99 101  CO2 $Re'26 27 30 30 29 26  'KTx$ GLUCOSE 125* 111* 115* 111* 110* 60*  BUN 86* 70* 32* 37* 42* 48*  CREATININE 5.55* 5.19* 2.66* 3.58* 4.64* 5.97*  CALCIUM 8.2* 8.0* 7.7* 7.8* 7.9* 7.8*  MG 2.1 1.9 1.6*  --  1.7 2.0  PHOS 4.3  --  2.2* 3.4 5.2* 6.1*   GFR: Estimated Creatinine Clearance: 8.6 mL/min (A) (by C-G formula based on SCr of 5.97 mg/dL (H)). Liver Function Tests: Recent Labs  Lab 07/06/22 1138  ALBUMIN 2.5*   No results for input(s): "LIPASE", "AMYLASE" in the last 168 hours. No results for input(s): "AMMONIA" in the last 168 hours. Coagulation Profile: Recent Labs  Lab 07/03/22 1530  INR 2.2*   Cardiac Enzymes: No results for input(s): "CKTOTAL", "CKMB", "CKMBINDEX", "TROPONINI" in the last 168 hours. BNP (last 3 results) No results for input(s): "PROBNP" in the last 8760  hours. HbA1C: No results for input(s): "HGBA1C" in the last 72 hours. CBG: Recent Labs  Lab 07/07/22 0607 07/07/22 0919 07/07/22 1238 07/07/22 1808 2022/07/10 0412  GLUCAP 110* 96 93 93 56*   Lipid Profile: No results for input(s): "CHOL", "HDL", "LDLCALC", "TRIG", "CHOLHDL", "LDLDIRECT" in the last 72 hours. Thyroid Function Tests: No results for input(s): "TSH", "T4TOTAL", "FREET4", "T3FREE", "THYROIDAB" in the last 72 hours. Anemia Panel: No results for input(s): "VITAMINB12", "FOLATE", "FERRITIN", "TIBC", "IRON", "RETICCTPCT" in the last 72 hours. Sepsis Labs: Recent Labs  Lab 07/03/22 1155 07/03/22 1530 07/05/22 1645 07/05/22 2057  LATICACIDVEN 1.3 1.3 1.3 1.0  No results found for this or any previous visit (from the past 240 hour(s)).       Radiology Studies: DG Chest Port 1 View  Result Date: 07/07/2022 CLINICAL DATA:  Hypoxia. EXAM: PORTABLE CHEST 1 VIEW COMPARISON:  Same day. FINDINGS: Stable cardiomegaly. Right-sided PICC line is unchanged. Mild bilateral pulmonary edema may be present. Mild left basilar atelectasis or infiltrate is noted with associated pleural effusion. Bony thorax is unremarkable. IMPRESSION: Stable cardiomegaly. Possible mild bilateral pulmonary edema. Mild left basilar atelectasis or infiltrate with associated pleural effusion. Electronically Signed   By: Marijo Conception M.D.   On: 07/07/2022 18:05   DG Chest Port 1 View  Result Date: 07/07/2022 CLINICAL DATA:  Hypoxia. EXAM: PORTABLE CHEST 1 VIEW COMPARISON:  One view chest x-ray 07/05/2022. CT of the chest 07/06/2022. FINDINGS: Heart is enlarged. Progressive airspace disease is present at both lower lobes. Mild edema is increasing. Right-sided PICC line is stable in position. IMPRESSION: 1. Progressive airspace disease at both lower lobes. This may represent atelectasis or infection. 2. Increasing mild edema. Electronically Signed   By: San Morelle M.D.   On: 07/07/2022 14:00    US Venous Img Upper Uni Left (DVT)  Result Date: 07/07/2022 CLINICAL DATA:  Left upper extremity pain and swelling. History of arteriovenous fistula. EXAM: LEFT UPPER EXTREMITY VENOUS DOPPLER ULTRASOUND TECHNIQUE: Gray-scale sonography with graded compression, as well as color Doppler and duplex ultrasound were performed to evaluate the upper extremity deep venous system from the level of the subclavian vein and including the jugular, axillary, basilic, radial, ulnar and upper cephalic vein. Spectral Doppler was utilized to evaluate flow at rest and with distal augmentation maneuvers. COMPARISON:  None Available. FINDINGS: Contralateral Subclavian Vein: Respiratory phasicity is normal and symmetric with the symptomatic side. No evidence of thrombus. Normal compressibility. Internal Jugular Vein: No evidence of thrombus. Normal compressibility, respiratory phasicity and response to augmentation. Subclavian Vein: No evidence of thrombus. Normal compressibility, respiratory phasicity and response to augmentation. Axillary Vein: No evidence of thrombus. Normal compressibility, respiratory phasicity and response to augmentation. Cephalic Vein: No evidence of thrombus. Normal compressibility, respiratory phasicity and response to augmentation. Basilic Vein: Unable to visualize. Brachial Veins: No evidence of thrombus. Normal compressibility, respiratory phasicity and response to augmentation. Radial Veins: No evidence of thrombus. Normal compressibility, respiratory phasicity and response to augmentation. Ulnar Veins: No evidence of thrombus. Normal compressibility, respiratory phasicity and response to augmentation. Venous Reflux:  None visualized. Other Findings: Arteriovenous graft is patent. Superficial subcutaneous edema is noted. IMPRESSION: No evidence of DVT within the left upper extremity. Patent arteriovenous graft. Electronically Signed   By: Jacqulynn Cadet M.D.   On: 07/07/2022 07:09         Scheduled Meds:  dextrose       sodium chloride flush  10-40 mL Intracatheter Q12H   Continuous Infusions:  sodium chloride     HYDROmorphone 0.5 mg/hr (07/18/22 1146)     LOS: 14 days      Sidney Ace, MD [facility]   If 7PM-7AM, please contact night-coverage  18-Jul-2022, 2:26 PM

## 2022-07-17 NOTE — Death Summary Note (Signed)
DEATH SUMMARY   Patient Details  Name: Kathleen Sharp MRN: [id number] DOB: [birth year] SAY:[id number], Kathleen Moselle, MD Admission/Discharge Information   Admit Date:  07-22-22  Date of Death:  Aug 06, 2022  Time of Death:  Dec 21, 1612  Length of Stay: 2022/12/27   Principle Cause of death: Hypoxic respiratory failure secondary to fluid overload/pulmonary edema  Hospital Diagnoses: Principal Problem:   NSTEMI (non-ST elevated myocardial infarction) [facility]) Active Problems:   Cardiac arrhythmia   Syncope and collapse   Pulmonary edema   Volume overload   Metabolic encephalopathy   Hyperkalemia   End stage renal disease ([place])   Essential hypertension   Anemia of chronic renal failure, stage 5 (HCC)   Anemia, posthemorrhagic, acute   Pseudoaneurysm following procedure [facility])   Hospital Course: [name] is a 76 year old female with history of end-stage renal disease on hemodialysis Monday Wednesday 28-Dec-2022, hypertension, cardiomegaly, hyperlipidemia, who presents emergency department for chief concerns of shortness of breath.  Patient was complaining of shortness of breath with associated flushed face and palpitations.  No chest pain, nausea or vomiting.  Initial vitals in the emergency department showed temperature of 97.5, respiration rate of 29, heart rate of 95, blood pressure 108/87, SPO2 of 96% on 4 L nasal cannula.  Serum sodium is 135, potassium 5.9, chloride of 97, bicarb 20, BUN of 63, serum creatinine of 9.33, GFR 4, nonfasting blood glucose 172, WBC 5.9, hemoglobin 11.8, platelets of 269.  Lactic acid is 4.1.  High sensitive troponin is 89.  COVID/influenza A/influenza B PCR negative.  ED treatment: Diltiazem 10 mg IV one-time dose, Lokelma p.o., insulin aspart 10 mg, D50, calcium gluconate, vancomycin and cefepime.  10/9: Patient overnight had another episode of dyspnea with flushed face and palpitations.  Found to have supraventricular tachycardia requiring IV diltiazem push.   Troponin increased to above 10,000, peaked at 14,207>>13,306.  Potassium at 6.1, BUN 17, creatinine 10.41, it yesterday 144, ALT 77 and anion gap of 16.  BNP >4500, D-dimer 2.66>>3.10. EKG with SVT, peaked T in anteroseptal and ST depression in lateral leads. Patient was started on heparin infusion by night on-call, cardiology and nephrology was consulted.  Going for dialysis this morning. Per patient she never missed any dialysis, last dialysis was on December 22, 2022 as she was unable to go for her 12-28-22 session due to the death of a dear friend. Echocardiogram pending. Patient refused cardiac catheterization.  10/10: Patient had 2 syncopal episodes, one last night and 1 this a.m. rapid response was called.  During this morning episode patient all of a sudden became unresponsive and appears very confused.  Initial vitals were stable.  Telemetry noted nonsustained V. tach followed by atrial fibrillation with RVR.  While preparing for amiodarone bolus patient converted back to sinus rhythm with heart rate in 60s.  CT chest and CTA was obtained to rule out CVA which was negative for any acute infarct and CTA of head and neck was negative for any significant large vessel stenosis. Her cardiologist was also consulted and she was placed on amiodarone infusion to prevent further arrhythmias. She also received 500 cc of bolus as blood pressure become softer little after this episode.  Hyperkalemia resolved.  Blood pressure little soft, Patient later agrees with cardiac catheterization which is scheduled for 12-27-22 morning now.  Troponin at 11,321 after peaking at 14,207.  Echocardiogram with normal EF, indeterminate diastolic function, no regional wall motion abnormalities and severe calcification of aortic valve without any evidence of stenosis.  10/11:  Patient again very lethargic but following some simple commands when seen during morning rounds.  Blood pressure remained on softer side but improved from  yesterday.  Telemetry with some paired PVCs and bigeminy. Going for dialysis today.  Cardiac cath planned for tomorrow morning, n.p.o. after midnight.  We will continue with amiodarone and heparin infusion.  Patient will remain high risk for decompensation and other cardiac arrhythmias based on her underlying comorbidities and recent NSTEMI.    Palliative care was consulted.  10/12: Patient continues to be lethargic.  Overnight rapid response was called secondary to change in mental status.  ABG was done which showed elevated PCO2.  Patient was placed on BiPAP which she has been very noncompliant with.  However this morning when I saw her she was able to identify her relatives at bedside and was a little bit more compliant in wearing the BiPAP.  Cardiology had planned for a heart catheterization today but secondary to her change in mental status this is being canceled.  10/13: No overnight concerns.  Patient had a shorter dialysis session today due to agitation and requesting to stop it so it was terminated prematurely.  Complaining of lower substernal pain. Discussed with cardiology and her heparin infusion will be discontinued as she is on heparin infusion for more than 72 hours now.  She was started on aspirin along with Plavix. Cardiac cath is not scheduled for Monday at 7:30 AM. She will continue on amiodarone infusion per cardiology as she continued to have transient tachyarrhythmias.  10/14: Patient had another session of dialysis with removal of 700 UF, becoming intermittently hypotensive, otherwise completed the session.  More alert and oriented today.   Had a long discussion with niece about her risk of deterioration and decompensation.  Cath is now planned for Monday.  10/15: Overnight patient became unresponsive, bradycardic and hypotensive.  Amiodarone infusion was discontinued, patient received atropine and was placed on Levophed.  Transferred to ICU.  A-line was placed, which showed  normal blood pressure so pressors were held.  Patient was also placed on BiPAP.  A.m. labs with resolution of leukocytosis, hemoglobin decreased to 8.6 from 9.5 with no obvious bleeding.  Troponin continued to trend down, at 2260 today. Patient again became bradycardic and hypotensive after receiving morning dose of Coreg which was placed on hold. After talking with 2 nieces at bedside-CODE [place] changed to DNR with full scope of medical care.  Patient is high risk for deterioration, decompensation and mortality.  Decreased tolerance of dialysis along with cardiac arrhythmias with this recent NSTEMI. Palliative care was also consulted.    10/23: Patient was transitioned to full comfort measures on 10/22.  Appreciate help from ICU and overnight cross cover team.  Appears more comfortable this morning.  Palliative care involved.  Converted to Dilaudid gtt.  Niece at bedside.  Appreciative of care.  Patient wishes to eat.  At this time I feel it is too high risk as small amount of p.o. intake can greatly increase the amount of suffering and shortness of breath.  Discussed with palliative care.  Dilaudid gtt initiated.  Titrated to patient comfort.  Patient was in no visible distress.   Patient passed away peacefully at 31.  Family at bedside.  Death confirmed by 2 RN.  Assessment and Plan: * NSTEMI (non-ST elevated myocardial infarction) ([place]) Significant increase in troponin, which peaked above 14,000, makes her in the range of NSTEMI.  EKG with ST depression in lateral leads along with SVT. Troponin  continued to trend down, at 2260 today. Cardiology was consulted she was started on heparin infusion-which she continued more than 72 hours so it was discontinued BNP markedly elevated above 4500 Cardiac catheterization on Thursday got canceled due to altered mental status and is now rescheduled for Monday at 7:30 AM. -Continue aspirin and Plavix -Continue to monitor  Cardiac arrhythmia Patient  is experiencing transient, self-limiting cardiac arrhythmias where she became symptomatic.  Had 2 syncopal episode during current hospitalization.  Most likely secondary to recent NSTEMI. Cardiology started her on amiodarone infusion, which was discontinued due to symptomatic bradycardia overnight requiring atropine. -Continue with telemetry monitoring  Syncope and collapse Delirium/intermittent altered mental status. Patient again became altered with hypotension and bradycardia requiring transfer to stepdown. Bradycardia improved after getting a dose of atropine.  A-line shows normal blood pressure, peripheral reading remains low, pressors were held. CT head was negative for any acute infarct.  Chronic lacunar infarcts noted. CTA head and neck was negative for any large vessel occlusion.  Some stenosis noted as mentioned in full report. -Continue to monitor -Palliative care consult.  Pulmonary edema - Presumed secondary to SVT and NSTEMI - Received her dialysis on admission and continuing routine dialysis at this time. -Intermittently becoming hypoxic and was placed on BiPAP.  Most likely some low perfusion and not picking up saturation very well.  ABG without any hypoxia  Metabolic encephalopathy Patient is exhibiting signs of delirium with intermittent change in mental status.  Underlying cardiac arrhythmias suspected.  She is becoming more confused after dialysis. Patient was placed on BiPAP overnight. Currently improved mental status at this time. -Continue to monitor  Volume overload - Presumed secondary to SVT and NSTEMI - Nephrology has been consulted for dialysis - Continue with oxygen-wean as tolerated  Hyperkalemia Resolved with dialysis. -Continue to monitor -Patient is high risk for cardiac arrhythmia  End stage renal disease [employer]) Patient received an extra session of dialysis today as she was unable to complete her routine yesterday due to some agitation. - Via left  upper extremity AV fistula - Monday, Wednesday, Friday -Nephrology is on board -Continue with routine dialysis  Essential hypertension - Patient takes Coreg 6.25 mg twice daily, lisinopril 40 mg daily Blood pressure seems improving with intermittently mildly elevated readings.  Still had couple of softer blood pressure readings. -Keep holding home antihypertensives -Continue to monitor  Anemia of chronic renal failure, stage 5 (HCC) Some decrease in hemoglobin to 8.6, no obvious bleeding. -Continue to monitor -Transfuse if below 8         Procedures: Cath  Consultations: Cardiology, PCCM, Palliative care, nephrology  The results of significant diagnostics from this hospitalization (including imaging, microbiology, ancillary and laboratory) are listed below for reference.   Significant Diagnostic Studies: DG Chest Port 1 View  Result Date: 07/07/2022 CLINICAL DATA:  Hypoxia. EXAM: PORTABLE CHEST 1 VIEW COMPARISON:  Same day. FINDINGS: Stable cardiomegaly. Right-sided PICC line is unchanged. Mild bilateral pulmonary edema may be present. Mild left basilar atelectasis or infiltrate is noted with associated pleural effusion. Bony thorax is unremarkable. IMPRESSION: Stable cardiomegaly. Possible mild bilateral pulmonary edema. Mild left basilar atelectasis or infiltrate with associated pleural effusion. Electronically Signed   By: Marijo Conception M.D.   On: 07/07/2022 18:05   DG Chest Port 1 View  Result Date: 07/07/2022 CLINICAL DATA:  Hypoxia. EXAM: PORTABLE CHEST 1 VIEW COMPARISON:  One view chest x-ray 07/05/2022. CT of the chest 07/06/2022. FINDINGS: Heart is enlarged. Progressive airspace disease is present  at both lower lobes. Mild edema is increasing. Right-sided PICC line is stable in position. IMPRESSION: 1. Progressive airspace disease at both lower lobes. This may represent atelectasis or infection. 2. Increasing mild edema. Electronically Signed   By: San Morelle  M.D.   On: 07/07/2022 14:00   US Venous Img Upper Uni Left (DVT)  Result Date: 07/07/2022 CLINICAL DATA:  Left upper extremity pain and swelling. History of arteriovenous fistula. EXAM: LEFT UPPER EXTREMITY VENOUS DOPPLER ULTRASOUND TECHNIQUE: Gray-scale sonography with graded compression, as well as color Doppler and duplex ultrasound were performed to evaluate the upper extremity deep venous system from the level of the subclavian vein and including the jugular, axillary, basilic, radial, ulnar and upper cephalic vein. Spectral Doppler was utilized to evaluate flow at rest and with distal augmentation maneuvers. COMPARISON:  None Available. FINDINGS: Contralateral Subclavian Vein: Respiratory phasicity is normal and symmetric with the symptomatic side. No evidence of thrombus. Normal compressibility. Internal Jugular Vein: No evidence of thrombus. Normal compressibility, respiratory phasicity and response to augmentation. Subclavian Vein: No evidence of thrombus. Normal compressibility, respiratory phasicity and response to augmentation. Axillary Vein: No evidence of thrombus. Normal compressibility, respiratory phasicity and response to augmentation. Cephalic Vein: No evidence of thrombus. Normal compressibility, respiratory phasicity and response to augmentation. Basilic Vein: Unable to visualize. Brachial Veins: No evidence of thrombus. Normal compressibility, respiratory phasicity and response to augmentation. Radial Veins: No evidence of thrombus. Normal compressibility, respiratory phasicity and response to augmentation. Ulnar Veins: No evidence of thrombus. Normal compressibility, respiratory phasicity and response to augmentation. Venous Reflux:  None visualized. Other Findings: Arteriovenous graft is patent. Superficial subcutaneous edema is noted. IMPRESSION: No evidence of DVT within the left upper extremity. Patent arteriovenous graft. Electronically Signed   By: Jacqulynn Cadet M.D.   On:  07/07/2022 07:09   CT CHEST WO CONTRAST  Result Date: 07/06/2022 CLINICAL DATA:  Chronic dyspnea of unclear etiology EXAM: CT CHEST WITHOUT CONTRAST TECHNIQUE: Multidetector CT imaging of the chest was performed following the standard protocol without IV contrast. RADIATION DOSE REDUCTION: This exam was performed according to the departmental dose-optimization program which includes automated exposure control, adjustment of the mA and/or kV according to patient size and/or use of iterative reconstruction technique. COMPARISON:  Chest radiograph dated 07/05/2022 FINDINGS: Cardiovascular: Multichamber cardiomegaly. No significant pericardial fluid/thickening. Aortic atherosclerosis. Coronary artery calcifications. Aortic valvular calcifications. Anatomic variant common origin of the brachiocephalic and common carotid arteries. Mediastinum/Nodes: Partially imaged thyroid gland without nodules meeting criteria for imaging follow-up by size. Normal esophagus. No pathologically enlarged axillary, supraclavicular, mediastinal, or hilar lymph nodes. Lungs/Pleura: The central airways are patent. Crescentic appearance of the lower trachea. Minimal upper lobe predominant centrilobular emphysema. Bilateral upper lobe subpleural reticular opacities. No focal consolidation. 3 mm right upper lobe subpleural pulmonary nodule (3:36). No follow-up needed if patient is low-risk.This recommendation follows the consensus statement: Guidelines for Management of Incidental Pulmonary Nodules Detected on CT Images: From the Fleischner Society 2017; Radiology 2017; 284:228-243. Right basilar relaxation atelectasis adjacent to the pleural effusion. Middle lobe, lingular, and left lower lobe subsegmental atelectasis. Mild interlobular septal thickening. No pneumothorax. Trace bilateral pleural effusions. Upper abdomen: Punctate calcified granuloma in hepatic segment 2. Cholelithiasis. No adrenal nodules. Bilateral atrophic kidneys.  Bilateral cysts, including a peripherally calcified subcentimeter cyst in the right upper lobe. Small volume free fluid. Musculoskeletal: No acute or abnormal lytic or blastic osseous lesions. Diffuse body wall edema. IMPRESSION: 1. Multichamber cardiomegaly with trace bilateral pleural effusions and mild interlobular septal thickening  suggestive of mild pulmonary edema. 2. Crescentic appearance of the lower trachea, which can be seen in the setting of tracheomalacia. 3. Bilateral upper lobe subpleural reticular opacities suggest a component of interstitial lung disease. 4. Cholelithiasis. 5. Coronary artery disease. Aortic Atherosclerosis (ICD10-I70.0) and Emphysema (ICD10-J43.9). Electronically Signed   By: Darrin Nipper M.D.   On: 07/06/2022 09:48   CT HEAD WO CONTRAST (5MM)  Result Date: 07/06/2022 CLINICAL DATA:  Mental status change with unknown cause EXAM: CT HEAD WITHOUT CONTRAST TECHNIQUE: Contiguous axial images were obtained from the base of the skull through the vertex without intravenous contrast. RADIATION DOSE REDUCTION: This exam was performed according to the departmental dose-optimization program which includes automated exposure control, adjustment of the mA and/or kV according to patient size and/or use of iterative reconstruction technique. COMPARISON:  07/03/2022 FINDINGS: Brain: No evidence of acute infarction, hemorrhage, hydrocephalus, extra-axial collection or mass lesion/mass effect. Chronic small vessel ischemia in the cerebral white matter. Vascular: No hyperdense vessel or unexpected calcification. Skull: Normal. Negative for fracture or focal lesion. Sinuses/Orbits: Mucosal thickening in the left sphenoid sinus which is chronic and stable. Other: Motion degraded, subtle findings could be obscured. IMPRESSION: Motion degraded head CT without acute or interval finding. Electronically Signed   By: Jorje Guild M.D.   On: 07/06/2022 09:42   DG Chest 1 View  Result Date:  07/05/2022 CLINICAL DATA:  Shortness of breath EXAM: CHEST  1 VIEW COMPARISON:  07/05/2022, 07/03/2022, 08/14/2020 FINDINGS: Cardiomegaly with vascular congestion and pulmonary edema. Aortic atherosclerosis. Right upper extremity central venous catheter tip at the distal SVC. No pneumothorax IMPRESSION: Cardiomegaly with similar vascular congestion and pulmonary edema. Possible left pleural effusion Electronically Signed   By: Donavan Foil M.D.   On: 07/05/2022 21:11   DG Chest Port 1 View  Result Date: 07/05/2022 CLINICAL DATA:  Acute respiratory failure EXAM: PORTABLE CHEST 1 VIEW COMPARISON:  07/03/2022 FINDINGS: Transverse diameter of heart is increased. Central pulmonary vessels are prominent. There is increase in interstitial markings in parahilar regions Increased density in left lower lung field may be related to cardiomegaly or possibly underlying atelectasis/pneumonia. There is blunting of left lateral CP angle suggesting small effusion. There is no pneumothorax. Tip of PICC line is seen in the region of superior vena cava close to the right atrium. IMPRESSION: Cardiomegaly. Prominence of central pulmonary vessels and increased interstitial markings in parahilar regions suggest CHF. Increased density in left lower lung fields may be due to pleural effusion and possibly underlying infiltrate. Electronically Signed   By: Elmer Picker M.D.   On: 07/05/2022 19:29   PERIPHERAL VASCULAR CATHETERIZATION  Result Date: 07/16/2022 See surgical note for result.  CT HEAD WO CONTRAST (5MM)  Result Date: 07/03/2022 CLINICAL DATA:  Initial evaluation for mental status change, unknown cause. EXAM: CT HEAD WITHOUT CONTRAST TECHNIQUE: Contiguous axial images were obtained from the base of the skull through the vertex without intravenous contrast. RADIATION DOSE REDUCTION: This exam was performed according to the departmental dose-optimization program which includes automated exposure control,  adjustment of the mA and/or kV according to patient size and/or use of iterative reconstruction technique. COMPARISON:  Prior study from 06/25/2022. FINDINGS: Brain: Cerebral volume within normal limits for age. Patchy multifocal hypodensities involving the supratentorial cerebral white matter, consistent with chronic small vessel ischemic disease. No acute intracranial hemorrhage. No acute large vessel territory infarct. No mass lesion, midline shift or mass effect. No hydrocephalus or extra-axial fluid collection. Vascular: No abnormal hyperdense vessel. Calcified atherosclerosis present  at the skull base. Skull: Scalp soft tissues and calvarium within normal limits. Sinuses/Orbits: Question mild left periorbital soft tissue swelling, of uncertain significance (series 4, image 7). Globes and orbital soft tissues demonstrate no other acute finding. Chronic left sphenoid sinusitis noted. Paranasal sinuses are otherwise clear. Trace left mastoid effusion, of doubtful significance. Other: None. IMPRESSION: 1. No acute intracranial abnormality. 2. Moderate chronic small vessel ischemic disease. 3. Question mild left periorbital soft tissue swelling, of uncertain significance. Correlation with physical exam recommended. 4. Chronic left sphenoid sinusitis. Electronically Signed   By: Jeannine Boga M.D.   On: 07/03/2022 23:03   [place] Lower Ext Art Right Ltd  Result Date: 07/03/2022 CLINICAL DATA:  77 year old female with history of hematoma after coronary catheterization on 06/23/2022 EXAM: Limited RIGHT LOWER EXTREMITY ARTERIAL DUPLEX SCAN TECHNIQUE: Gray-scale sonography as well as color Doppler and duplex ultrasound was performed to evaluate the lower extremity arteries including the common, superficial and profunda femoral arteries, popliteal artery and calf arteries. COMPARISON:  CTA abdomen pelvis from 07/03/2022. FINDINGS: About the anterior aspect of the right common femoral artery is a partially  thrombosed pseudoaneurysm measuring approximately 1.9 x 0.4 x 1.3 cm. Color Doppler demonstrates central patency. There is a short neck measuring approximately 2.9 mm in length, 2.0 mm in with. The common femoral artery appears patent proximal and distal to the pseudoaneurysm. IMPRESSION: Access site pseudoaneurysm measuring up to 1.9 cm just anterior to the right common femoral artery. [name], MD [facility] Electronically Signed   By: [name] M.D.   On: 07/03/2022 16:26   DG Chest Port 1 View  Result Date: 07/03/2022 CLINICAL DATA:  Acute respiratory failure. EXAM: PORTABLE CHEST 1 VIEW COMPARISON:  Radiographs 06/30/2022 and 06/25/2022. FINDINGS: 1223 hours. Right-sided PICC previously projected to the level the right atrium; this line now demonstrates acute angulation at the level of the azygous vein and projects to the left, likely within the left brachiocephalic vein. Stable cardiomegaly and aortic atherosclerosis. There is mild vascular congestion with interval improved aeration of the right lung base. No consolidation, pneumothorax or significant pleural effusion identified. Avascular stent is noted in the left axilla. The bones appear unremarkable. Telemetry leads overlie the chest. IMPRESSION: 1. Interval improved aeration of the right lung base. 2. Interval angulation of the right-sided PICC, tip now likely within the left brachiocephalic vein. 3. Cardiomegaly and mild vascular congestion. Electronically Signed   By: Richardean Sale M.D.   On: 07/03/2022 12:37   CT ANGIO GI BLEED  Result Date: 07/03/2022 CLINICAL DATA:  Mesenteric ischemia, acute EXAM: CTA ABDOMEN AND PELVIS WITHOUT AND WITH CONTRAST TECHNIQUE: Multidetector CT imaging of the abdomen and pelvis was performed using the standard protocol during bolus administration of intravenous contrast. Multiplanar reconstructed images and MIPs were obtained and reviewed to  evaluate the vascular anatomy. RADIATION DOSE REDUCTION: This exam was performed according to the departmental dose-optimization program which includes automated exposure control, adjustment of the mA and/or kV according to patient size and/or use of iterative reconstruction technique. CONTRAST:  159mL OMNIPAQUE IOHEXOL 350 MG/ML SOLN COMPARISON:  None Available. FINDINGS: VASCULAR Aorta: Aortoiliac atherosclerosis.  No aneurysm or dissection. Celiac: Mild stenosis at the celiac origin. SMA: Patent without significant stenosis.  Mixed atherosclerosis. Renals: Patent without significant stenosis.  Mixed atherosclerosis. IMA: Patent without significant stenosis. Inflow: Mixed atherosclerosis. No significant stenosis. No aneurysm or dissection. Proximal Outflow: Mixed atherosclerosis without significant stenosis. There is a focal outpouching of the anterior  right common femoral artery with a connecting neck, which does not significantly change on delay. This measures approximately 6 x 4 mm (series 6, image 68). There is adjacent soft tissue thickening/possible hematoma (series 5, image 168). Veins: No obvious venous abnormality. Review of the MIP images confirms the above findings. NON-VASCULAR Lower chest: Cardiomegaly. Trace pleural effusions. Lingular and right basilar atelectasis. Hepatobiliary: No focal liver abnormality. The gallbladder is decompressed with tiny layering internal stones and mild wall thickening. Pancreas: No ductal dilation or peripancreatic inflammatory change. Spleen: Unremarkable. Adrenals/Urinary Tract: Mild left adrenal thickening without discrete nodule. Right adrenal gland is normal. Atrophic left kidney. Multiple renal cysts bilaterally in keeping with history of end-stage renal disease on dialysis. The bladder is mildly distended. Stomach/Bowel: The stomach is within normal limits. There is no evidence of bowel obstruction. There is no evidence of active gastrointestinal bleed. Study is  somewhat limited by the presence of hyperattenuating material throughout the transverse and proximal descending colon. Sigmoid diverticulosis. Lymphatic: No lymphadenopathy. Reproductive: Prior hysterectomy. Other: No ascites.  No free air. Musculoskeletal: No acute osseous abnormality. No suspicious osseous lesion. Multilevel degenerative changes of the spine. IMPRESSION: No CT evidence of active gastrointestinal bleed. Focal outpouching of the anterior right common femoral artery with adjacent soft tissue thickening/possible small hematoma. This could represent a pseudoaneurysm. Recommend targeted ultrasound. No other acute findings in the abdomen or pelvis. Trace pleural effusions.  Cardiomegaly. Electronically Signed   By: Maurine Simmering M.D.   On: 07/03/2022 11:41   CARDIAC CATHETERIZATION  Result Date: 06/18/2022   Prox RCA lesion is 30% stenosed.   Dist Cx lesion is 30% stenosed.   Dist LAD lesion is 30% stenosed.   The left ventricular systolic function is normal.   The left ventricular ejection fraction is 50-55% by visual estimate.   DG Chest 1 View  Result Date: 06/30/2022 CLINICAL DATA:  Short of breath.  Follow-up study.  Inpatient. EXAM: CHEST  1 VIEW COMPARISON:  06/25/2022 and older exams. FINDINGS: Stable enlargement of the cardiac silhouette. Low lung volumes. Mild opacity at both lung bases consistent with atelectasis. Remainder of the lungs is clear. No pneumothorax. Right-sided PICC, tip projecting near the caval atrial junction, new since the prior study. IMPRESSION: 1. No acute findings.  No current evidence of pulmonary edema. 2. Mild lung base opacities consistent with atelectasis. No convincing pneumonia. 3. Stable cardiomegaly. Electronically Signed   By: Lajean Manes M.D.   On: 06/30/2022 11:03   [place] EKG SITE RITE  Result Date: 06/27/2022 If Site Rite image not attached, placement could not be confirmed due to current cardiac rhythm.  CT ANGIO HEAD NECK W WO CM  Result  Date: 06/25/2022 CLINICAL DATA:  Provided history: Change in mental status. Additional history provided: sncopal episode last night. EXAM: CT ANGIOGRAPHY HEAD AND NECK TECHNIQUE: Multidetector CT imaging of the head and neck was performed using the standard protocol during bolus administration of intravenous contrast. Multiplanar CT image reconstructions and MIPs were obtained to evaluate the vascular anatomy. Carotid stenosis measurements (when applicable) are obtained utilizing NASCET criteria, using the distal internal carotid diameter as the denominator. RADIATION DOSE REDUCTION: This exam was performed according to the departmental dose-optimization program which includes automated exposure control, adjustment of the mA and/or kV according to patient size and/or use of iterative reconstruction technique. CONTRAST:  58mL OMNIPAQUE IOHEXOL 350 MG/ML SOLN COMPARISON:  No pertinent prior exams available for comparison. FINDINGS: CT HEAD FINDINGS Brain: Mild generalized cerebral atrophy. Moderate patchy and  ill-defined hypoattenuation within the cerebral white matter, nonspecific but compatible with chronic small vessel disease. Small chronic lacunar infarct within the left caudate head. There is no acute intracranial hemorrhage. No demarcated cortical infarct. No extra-axial fluid collection. No evidence of an intracranial mass. No midline shift. Vascular: No hyperdense vessel. Atherosclerotic calcifications. Skull: No fracture or aggressive osseous lesion. Sinuses/Orbits: No mass or acute finding within the imaged orbits. Fluid level, and background mild mucosal thickening, within the left sphenoid sinus. Review of the MIP images confirms the above findings CTA NECK FINDINGS Aortic arch: Common origin of the innominate left common carotid arteries. Atherosclerotic plaque within the visualized aortic arch and proximal major branch vessels of the neck. Streak and beam hardening artifact arising from a dense  right-sided contrast bolus partially obscures the right subclavian artery. Within this limitation, there is no appreciable hemodynamically significant innominate or proximal subclavian artery stenosis. Right carotid system: CCA and ICA patent within the neck without hemodynamically significant stenosis (50% or greater). Atherosclerotic plaque at the CCA origin, within the proximal CCA, about the carotid bifurcation and within the proximal ICA. Left carotid system: CCA and ICA patent within the neck without hemodynamically significant stenosis (50% or greater). Atherosclerotic plaque at the CCA origin, about the carotid bifurcation and within the proximal ICA. Vertebral arteries: The dominant right vertebral artery is patent within the neck. Mild atherosclerotic narrowing at the origin of this vessel. The non dominant left vertebral artery is developmentally diminutive, but patent throughout the neck. Skeleton: Cervical spondylosis. Facet joint ankylosis on the right at C4-C5. No acute fracture or aggressive osseous lesion. Other neck: Calcified nodules within the bilateral thyroid lobes, measuring up to 13 mm, not meeting consensus criteria for ultrasound follow-up based on size. No follow-up imaging is recommended. Thyroid reference no cervical lymphadenopathy. Upper chest: No consolidation within the imaged lung apices. Emphysema. Review of the MIP images confirms the above findings CTA HEAD FINDINGS Anterior circulation: The intracranial internal carotid arteries are patent. Atherosclerotic plaque within both vessels. Mild to moderate stenosis within the right paraclinoid segment. No more than mild atherosclerotic narrowing of the intracranial left ICA The M1 middle cerebral arteries are patent. No M2 proximal branch occlusion or high-grade proximal stenosis. The anterior cerebral arteries are patent. No intracranial aneurysm is identified. Posterior circulation: The dominant cranial right vertebral artery is  patent. Nonstenotic atherosclerotic plaque within this vessel. Severe stenosis within the proximal left V4 segment. The basilar artery is patent. The posterior cerebral arteries are patent. Hypoplastic P1 segment with sizable posterior communicating arteries, bilaterally. Venous sinuses: Within the limitations of contrast timing, no convincing thrombus. Anatomic variants: As described Review of the MIP images confirms the above findings IMPRESSION: CT head: 1. No evidence of acute intracranial abnormality. 2. Moderate chronic small ischemic disease within the cerebral white matter. 3. Small chronic lacunar infarct within the left caudate head. 4. Mild generalized cerebral atrophy. 5. Left sphenoid sinusitis. CTA neck: 1. The common carotid and internal carotid arteries are patent within the neck without hemodynamically significant stenosis. Atherosclerotic plaque, bilaterally. 2. Vertebral arteries patent within the neck. Mild atherosclerotic narrowing at the origin of the dominant right vertebral artery. 3. Aortic Atherosclerosis (ICD10-I70.0) and Emphysema (ICD10-J43.9). CTA head: 1. No intracranial large vessel occlusion is identified. 2. Intracranial atherosclerotic disease with multifocal stenoses, most notably as follows. 3. Severe stenosis within the left vertebral artery proximal V4 segment. 4. Mild-to-moderate stenosis within the paraclinoid right ICA. Electronically Signed   By: Jackey Loge D.O.  On: 06/25/2022 12:39   DG Chest Port 1 View  Result Date: 06/25/2022 CLINICAL DATA:  Shortness of breath EXAM: PORTABLE CHEST 1 VIEW COMPARISON:  Chest x-ray June 23, 2022 FINDINGS: Unchanged cardiomediastinal contours including cardiomegaly. Slightly improved aeration of bibasilar lungs. Persistent bilateral reticular and interstitial pulmonary opacities. Probable small bilateral pleural effusions. No large pneumothorax. The visualized upper abdomen is unremarkable. No acute osseous abnormality.  IMPRESSION: 1. Slightly improved aeration of bilateral lungs with persistent interstitial pulmonary opacities, most consistent with pulmonary edema. 2. Small bilateral pleural effusions. 3. Cardiomegaly. Electronically Signed   By: Beryle Flock M.D.   On: 06/25/2022 11:42   ECHOCARDIOGRAM COMPLETE BUBBLE STUDY  Result Date: 06/24/2022    ECHOCARDIOGRAM REPORT   Patient Name:   ANUPAMA PIEHL Ritsema Date of Exam: 06/24/2022 Medical Rec #:  [id number]       Height:       62.0 in Accession #:    [id number]      Weight:       186.5 lb Date of Birth:  December 18, 1945        BSA:          1.856 m Patient Age:    33 years        BP:           125/85 mmHg Patient Gender: F               HR:           72 bpm. Exam Location:  [facility] Procedure: 2D Echo, Cardiac Doppler, Color Doppler and Saline Contrast Bubble            Study Indications:     Syncope 780.2 / R55  History:         Patient has no prior history of Echocardiogram examinations.                  Risk Factors:Hypertension. CKD.  Sonographer:     Sherrie Sport Referring Phys:  [id number] AMY N COX Diagnosing Phys: Yolonda Kida MD  Sonographer Comments: Technically challenging study due to limited acoustic windows and no apical window. IMPRESSIONS  1. Negative bubble study.  2. Left ventricular ejection fraction, by estimation, is 55 to 60%. The left ventricle has normal function. The left ventricle has no regional wall motion abnormalities. There is moderate concentric left ventricular hypertrophy. Left ventricular diastolic function could not be evaluated.  3. Right ventricular systolic function is normal. The right ventricular size is normal.  4. The mitral valve is normal in structure. Trivial mitral valve regurgitation.  5. The aortic valve is calcified. There is severe calcifcation of the aortic valve. There is severe thickening of the aortic valve. Aortic valve regurgitation is mild. Aortic valve sclerosis/calcification is present, without any evidence of aortic  stenosis. FINDINGS  Left Ventricle: Left ventricular ejection fraction, by estimation, is 55 to 60%. The left ventricle has normal function. The left ventricle has no regional wall motion abnormalities. The left ventricular internal cavity size was normal in size. There is  moderate concentric left ventricular hypertrophy. Left ventricular diastolic function could not be evaluated. Right Ventricle: The right ventricular size is normal. No increase in right ventricular wall thickness. Right ventricular systolic function is normal. Left Atrium: Left atrial size was normal in size. Right Atrium: Right atrial size was normal in size. Pericardium: There is no evidence of pericardial effusion. Mitral Valve: The mitral valve is normal in structure. There is mild thickening of the  mitral valve leaflet(s). There is mild calcification of the mitral valve leaflet(s). Normal mobility of the mitral valve leaflets. Trivial mitral valve regurgitation. Tricuspid Valve: The tricuspid valve is grossly normal. Tricuspid valve regurgitation is mild. Aortic Valve: The aortic valve is calcified. There is severe calcifcation of the aortic valve. There is severe thickening of the aortic valve. There is moderate to severe aortic valve annular calcification. Aortic valve regurgitation is mild. Aortic valve sclerosis/calcification is present, without any evidence of aortic stenosis. Pulmonic Valve: The pulmonic valve was normal in structure. Pulmonic valve regurgitation is not visualized. Aorta: The ascending aorta was not well visualized. IAS/Shunts: No atrial level shunt detected by color flow Doppler. Agitated saline contrast was given intravenously to evaluate for intracardiac shunting. Additional Comments: Negative bubble study.  LEFT VENTRICLE PLAX 2D LVIDd:         4.20 cm LVIDs:         3.00 cm LV PW:         1.50 cm LV IVS:        1.70 cm LVOT diam:     2.00 cm LVOT Area:     3.14 cm  LEFT ATRIUM         Index LA diam:    3.60 cm  1.94 cm/m                        PULMONIC VALVE AORTA                 PR End Diast Vel: 9.73 msec Ao Root diam: 2.70 cm  TRICUSPID VALVE TR Peak grad:   42.8 mmHg TR Vmax:        327.00 cm/s  SHUNTS Systemic Diam: 2.00 cm Yolonda Kida MD Electronically signed by Yolonda Kida MD Signature Date/Time: 06/24/2022/8:13:11 PM    Final    DG Chest Portable 1 View  Result Date: 06/18/2022 CLINICAL DATA:  Shortness of breath EXAM: PORTABLE CHEST 1 VIEW COMPARISON:  Multiple chest x-rays, most recently August 14, 2020 FINDINGS: Cardiomegaly. Unchanged mediastinal contours including calcified atherosclerosis of the aortic arch. Diffuse bilateral interstitial pulmonary opacities. Moderate left and small right pleural effusions. No large pneumothorax. No acute osseous abnormality. The visualized upper abdomen is unremarkable. IMPRESSION: 1. Moderate pulmonary edema. 2. Moderate left and small right pleural effusions. 3. Cardiomegaly. Electronically Signed   By: Beryle Flock M.D.   On: 06/27/2022 16:41    Microbiology: No results found for this or any previous visit (from the past 240 hour(s)).  Time spent: 60 minutes  Signed: Sidney Ace, MD 27-Jul-2022

## 2022-07-17 NOTE — Progress Notes (Addendum)
                                                     Palliative Care Progress Note, Assessment & Plan   Patient Name: Kathleen Sharp       Date: Jul 15, 2022 DOB: [birth year]  Age: 76 y.o. MRN#: [id number] Attending Physician: Sidney Ace, MD Primary Care Physician: Adalberto Ill, MD Admit Date: 07/06/2022  Reason for Consultation/Follow-up: Establishing goals of care  Subjective: Patient is lying in bed in no apparent distress. Respirations are even and unlabored with 2L [place] in place. No family present at bedside.   Summary of counseling/coordination of care: After reviewing the patient's chart and assessing the patient at bedside, I counseled with patient's dayshift RN Katie. Plan remains for full comfort measures. MAR/orders reviewed and edited to reflect full comfort measures.   Given patient creatinine is elevated (5.97), dilaudid gtt with dilaudid boluses available via infusion recommended. Attending made aware of recommendation, adjustment made to [facility], and RN notified of new order for dilaudid gtt.  Patient has no s/s of distress, increased WOB, or suffering at this time. PMT will continue to monitor the patient throughout her hospitalization and ensure full comfort measures with relief from pain and suffering are in place.    Physical Exam Vitals reviewed.  Constitutional:      General: She is not in acute distress.    Appearance: She is obese. She is ill-appearing. She is not toxic-appearing.  HENT:     Head: Normocephalic.  Cardiovascular:     Rate and Rhythm: Bradycardia present.  Pulmonary:     Effort: Pulmonary effort is normal.  Abdominal:     Palpations: Abdomen is soft.  Musculoskeletal:     Comments: Generalized weakness  Psychiatric:        Mood and Affect: Mood is not anxious.        Behavior: Behavior is not agitated.              Palliative Assessment/Data: 20%    Total Time 50 minutes  Greater than 50%  of this time was spent counseling and coordinating care related to the above assessment and plan.  Thank you for allowing the Palliative Medicine Team to assist in the care of this patient.  [place] Ilsa Iha, FNP-BC Palliative Medicine Team Team Phone # [phone]

## 2022-07-17 NOTE — Progress Notes (Signed)
Hypoglycemic Event  CBG: 56   Treatment: D50 25 mL (12.5 gm)  Symptoms: None  Follow-up CBG: Lab Draw CBG Result: 60  Possible Reasons for Event: Other: Not eating d/t risk of aspiration; comfort care  Comments/MD notified:N/A protocol followed    Ethelene Hal

## 2022-07-17 NOTE — Progress Notes (Signed)
SLP Cancellation Note  Patient Details Name: Kathleen Sharp MRN: [id number] DOB: 1946/08/02   Cancelled treatment:       Reason Eval/Treat Not Completed: Other (comment) Per chart review, family has placed pt on comfort care measures. ST order discontinued   [name]  MS [facility] SLP   [name] 08/05/22, 8:20 AM

## 2022-07-17 NOTE — Plan of Care (Signed)
Discussed with patient's family comfort care with some teach back displayed.  Discussed with patient plan of care for the shift and comfort measures / medication at this time.  Problem: Education: Goal: Knowledge of General Education information will improve Description: Including pain rating scale, medication(s)/side effects and non-pharmacologic comfort measures Outcome: Progressing

## 2022-07-17 NOTE — Progress Notes (Signed)
OT Cancellation Note  Patient Details Name: Kathleen Sharp MRN: [id number] DOB: 1946-02-12   Cancelled Treatment:    Reason Eval/Treat Not Completed: Other (comment). Per chart review, family has placed pt on comfort care measures. OT to complete order.  Darleen Crocker, [place], OTR/L , CBIS ascom [phone]  2022-07-31, 7:27 AM

## 2022-07-17 NOTE — Progress Notes (Signed)
Time of death confirmed by this RN and Oneita Jolly. Family present at bedside. MD notified.

## 2022-07-17 DEATH — deceased

## 2022-10-14 IMAGING — CR DG CHEST 2V
1 series · 2 of 2 positions shown · non-contrast
Comparison: Sunday October, 2015

CLINICAL DATA: Suspected sepsis, fever and weakness.

EXAM:
CHEST - 2 VIEW

[Series 1: dg chest 2 view · 0.14mm/px · 2 of 2 slices shown]
[im 1/2]
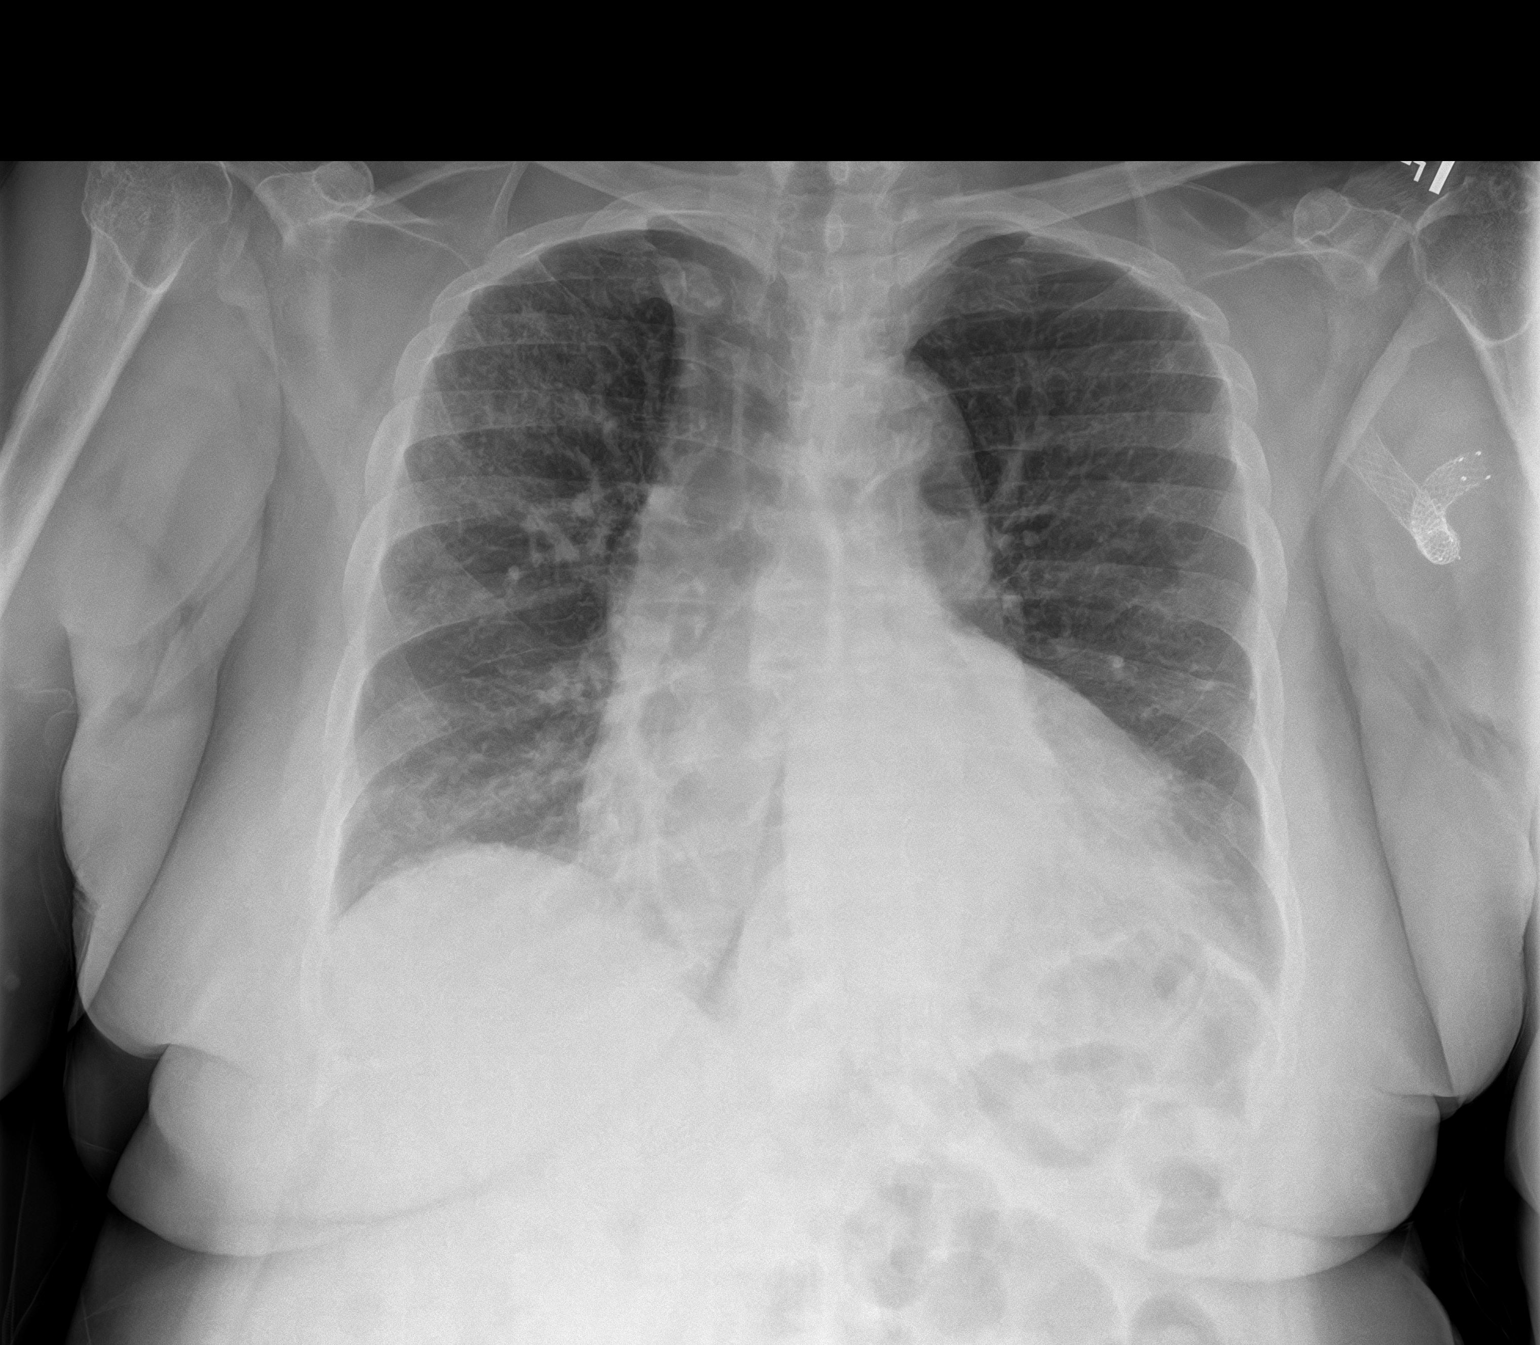
[im 2/2]
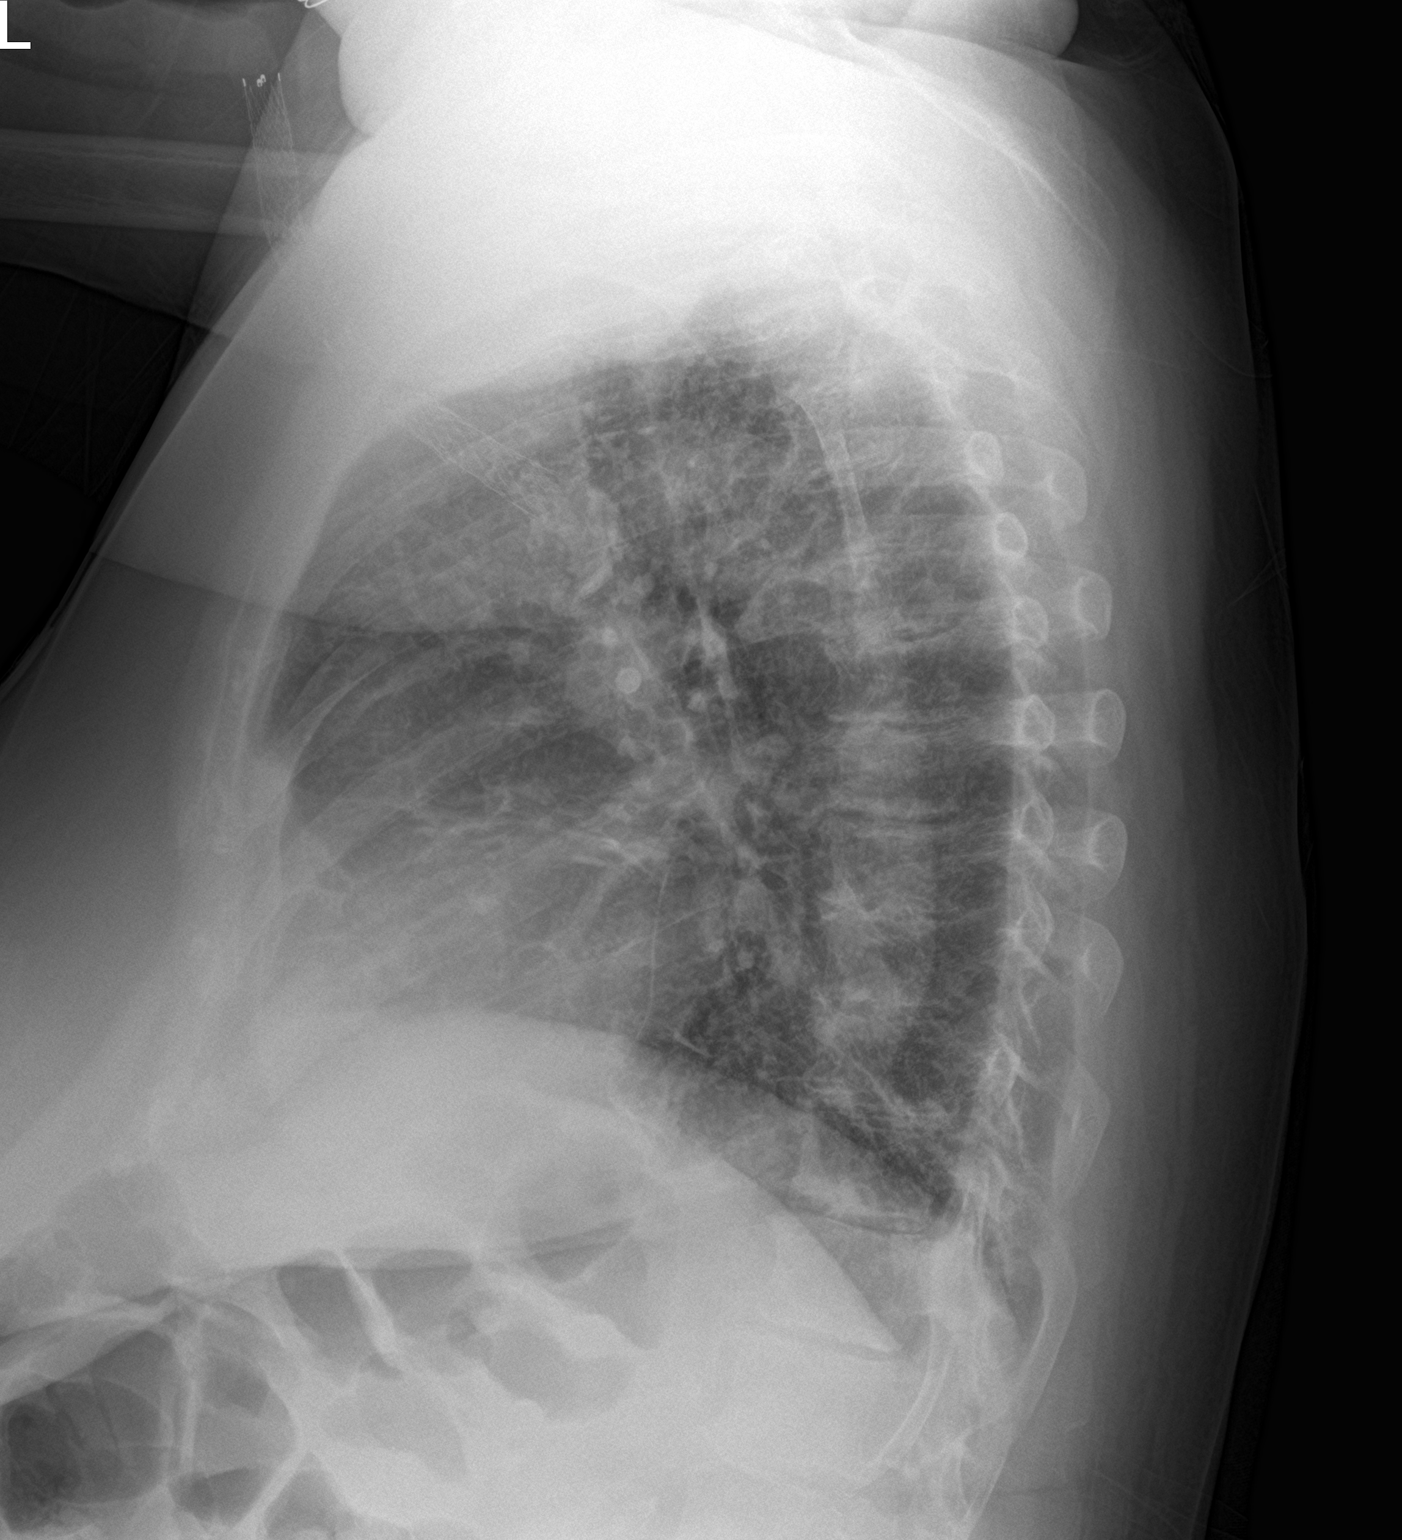

[2 of 2 positions shown; findings below may reference images not displayed]

FINDINGS: Stenting in the LEFT axilla.

Heart size is enlarged, increased size since previous imaging.
Fullness of RIGHT and LEFT hila.

Mild interstitial prominence throughout the chest. No lobar
consolidative changes. Partially obscured LEFT hemidiaphragm on
frontal view. Osteopenia without acute musculoskeletal process on
limited assessment.
IMPRESSION: 1. Cardiomegaly with vascular congestion.
2. Potential developing pneumonia in the LEFT lung base. This could
also represent atelectasis given lack of clear correlate on lateral
view.
3. Signs of vascular stenting in the LEFT axilla.
